# Patient Record
Sex: Male | Born: 1955 | Race: Black or African American | Hispanic: No | Marital: Single | Smoking: Never smoker
Health system: Southern US, Community
[De-identification: ages and names within clinical notes are randomized; demographics above are authoritative.]

## PROBLEM LIST (undated history)

## (undated) DIAGNOSIS — E785 Hyperlipidemia, unspecified: Secondary | ICD-10-CM

## (undated) DIAGNOSIS — I509 Heart failure, unspecified: Secondary | ICD-10-CM

## (undated) DIAGNOSIS — I10 Essential (primary) hypertension: Secondary | ICD-10-CM

## (undated) DIAGNOSIS — E119 Type 2 diabetes mellitus without complications: Secondary | ICD-10-CM

## (undated) DIAGNOSIS — I429 Cardiomyopathy, unspecified: Secondary | ICD-10-CM

## (undated) HISTORY — DX: Heart failure, unspecified: I50.9

## (undated) HISTORY — DX: Type 2 diabetes mellitus without complications: E11.9

## (undated) HISTORY — DX: Hyperlipidemia, unspecified: E78.5

## (undated) HISTORY — DX: Cardiomyopathy, unspecified: I42.9

## (undated) HISTORY — DX: Essential (primary) hypertension: I10

## (undated) NOTE — *Deleted (*Deleted)
Physical Medicine and Rehabilitation Consult  Reason for Consult: Stroke with functional deficits Referring Physician: Dr. Casper Harrison   HPI: Trevor Anderson is a 62 y.o. male with history of T2DM, HTN, non-obstructive CAD with CM/CHF who was admitted on 03/12/20 with 2 week history of progressive SOB as well as fall out of his chair. He was tachycardic and tachypneic at admission and started on IV heparin as well as IV diuresis due to concerns of PE v/s CHF. V/Q scan negative. 2D echo showed EF 45-50% with global hypokinesis of LV and trival MR. Later that am, he was found to have right facial weakness with dysarthria and right sided weakness. CTA head/neck was negative for LVO or core infarct and showed advanced intracranial atherosclerosis affecting cirlce of Willis, B-MCAs, L-PCA and distal ACAs.  CT right hip showed advanced hip OA.  MRI brain done revealing moderate left basal ganglia and small acute infarcts in right corona radiata and basal ganglia as well as paramedian cerebellum. Stroke felt to be embolic due to unknown source and TEE planned for work up but also at high risk for OSA due to body habitus. He was placed on DAPT for secondary stroke prevention. Patient with resultant flaccid right hemiplegia with moderate to severe dysarthria and dysphagia requiring dysphagia 2, nectar liquids.     Review of Systems  Constitutional: Negative for chills and fever.  HENT: Negative for hearing loss.   Eyes: Negative for blurred vision and double vision.  Respiratory: Negative for cough and shortness of breath.   Cardiovascular: Negative for chest pain.  Gastrointestinal: Negative for abdominal pain.  Musculoskeletal: Positive for joint pain.  Skin: Negative for rash.  Neurological: Positive for speech change and weakness.  Psychiatric/Behavioral: The patient has insomnia.       Past Medical History:  Diagnosis Date  . 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 CONGESTIVE HEART FAILURE  11/12/2007  . DIABETES MELLITUS, TYPE II 11/20/2008  . HYPERLIPIDEMIA 11/12/2007  . HYPERTENSION 11/12/2007  . TACHYCARDIA 01/24/2010    Past Surgical History:  Procedure Laterality Date  . TONSILLECTOMY  1970    Family History  Problem Relation Age of Onset  . Kidney disease Mother     Social History:  Lives with family?. Per reports that he has never smoked. He has never used smokeless tobacco. Per reports current alcohol use and that he does not use drugs.    Allergies: No Known Allergies    Medications Prior to Admission  Medication Sig Dispense Refill  . amLODipine (NORVASC) 5 MG tablet TAKE 1 TABLET BY MOUTH ONCE DAILY (Patient taking differently: Take 5 mg by mouth daily. ) 90 tablet 3  . aspirin 81 MG tablet Take 81 mg by mouth daily.      . carvedilol (COREG) 25 MG tablet TAKE 1 TABLET BY MOUTH TWICE DAILY WITH MEALS (Patient taking differently: Take 25 mg by mouth 2 (two) times daily with a meal. ) 60 tablet 11  . furosemide (LASIX) 40 MG tablet TAKE 1 TABLET BY MOUTH ONCE DAILY (Patient taking differently: Take 40 mg by mouth daily. ) 90 tablet 1  . hydrALAZINE (APRESOLINE) 25 MG tablet TAKE 1 TABLET BY MOUTH THREE TIMES DAILY (Patient taking differently: Take 25 mg by mouth 3 (three) times daily. ) 270 tablet 0  . isosorbide mononitrate (IMDUR) 60 MG 24 hr tablet TAKE 1 TABLET BY MOUTH ONCE DAILY (Patient taking differently: Take 60 mg by mouth daily. ) 90 tablet 1  .  KLOR-CON M20 20 MEQ tablet TAKE 1 TABLET BY MOUTH ONCE DAILY (Patient taking differently: Take 20 mEq by mouth daily. ) 90 tablet 1  . lisinopril (PRINIVIL,ZESTRIL) 20 MG tablet TAKE 1 TABLET BY MOUTH ONCE DAILY (Patient taking differently: Take 20 mg by mouth daily. ) 90 tablet 3  . metFORMIN (GLUCOPHAGE) 500 MG tablet TAKE 1 TABLET BY MOUTH ONCE DAILY (Patient taking differently: Take 500 mg by mouth daily with breakfast. ) 90 tablet 3  . simvastatin (ZOCOR) 40 MG tablet TAKE 1 TABLET BY MOUTH AT BEDTIME, NEED  DR VISIT FOR MORE REFILLS (Patient taking differently: Take 40 mg by mouth every morning. ) 90 tablet 2  . Lancets MISC 1 application by Does not apply route daily. 100 each 11    Home: Home Living Family/patient expects to be discharged to:: Private residence Living Arrangements: Children Available Help at Discharge: Family, Available PRN/intermittently Type of Home: House Home Access: Stairs to enter Secretary/administrator of Steps: 3 Entrance Stairs-Rails: Can reach both Home Layout: One level Bathroom Shower/Tub: Engineer, manufacturing systems: Standard Home Equipment: Cane - single point  Functional History: Prior Function Level of Independence: Independent Functional Status:  Mobility: Bed Mobility Overal bed mobility: Needs Assistance Bed Mobility: Rolling Rolling: Max assist Supine to sit: Mod assist, +2 for physical assistance Sit to supine: Mod assist General bed mobility comments: Pt able to move L side but required heavy assistance for R side.  Pt presents with R side posterior lean in sitting edge of bed.  Sitting balance improved with use of sara stedy in front as he was able to hold to cross bar. Transfers Overall transfer level: Needs assistance Equipment used: Ambulation equipment used (sara stedy) Transfers: Sit to/from Stand Sit to Stand: Max assist, +2 safety/equipment, From elevated surface General transfer comment: Pt able to follow commands to utilize L side of his body to come into standing.  Stedy blocked R knee and PTA facilitated trunk control and weight bearing to R shoulder and head control.  Emphasis on finding midline.      ADL: ADL Overall ADL's : Needs assistance/impaired Eating/Feeding: Moderate assistance, Bed level Grooming: Wash/dry hands, Wash/dry face, Moderate assistance, Bed level Upper Body Bathing: Maximal assistance, Bed level Lower Body Bathing: Total assistance, Bed level Upper Body Dressing : Maximal assistance, Bed level  Lower Body Dressing: Total assistance, Bed level Toileting- Clothing Manipulation and Hygiene: Total assistance, Bed level  Cognition: Cognition Overall Cognitive Status: Difficult to assess (due to severity of dysarthria) Arousal/Alertness: Awake/alert Orientation Level: Oriented X4 Attention: Sustained, Focused Focused Attention: Impaired Focused Attention Impairment: Verbal complex Sustained Attention: Impaired Sustained Attention Impairment: Verbal complex Cognition Arousal/Alertness: Awake/alert Behavior During Therapy: WFL for tasks assessed/performed Overall Cognitive Status: Difficult to assess (due to severity of dysarthria) Area of Impairment: Following commands, Safety/judgement Following Commands: Follows one step commands inconsistently, Follows one step commands with increased time Safety/Judgement: Decreased awareness of safety, Decreased awareness of deficits General Comments: Pt fatigues quickly but willing to work.  Required increased time and redirection to follow commands.  Blood pressure (!) 108/91, pulse 93, temperature 98.3 F (36.8 C), temperature source Oral, resp. rate 14, height 5\' 11"  (1.803 m), weight 110 kg, SpO2 99 %. Physical Exam Vitals and nursing note reviewed.  Constitutional:      Comments: Was sleeping soundly and snoring. Had hard time waking up and staying awake--reports poor sleep.   Neurological:     Comments: Expressive deficits with bouts of aphonia--needed encouragement to use words  as easily frustrated. Right facial weakness with ptosis? Able to nod Y/N for basic orientation questions. Right sided weakness noted and he moved LUE/LLE to verbal/tactile cues.     Results for orders placed or performed during the hospital encounter of 03/11/20 (from the past 24 hour(s))  CBC     Status: None   Collection Time: 03/15/20  1:17 AM  Result Value Ref Range   WBC 7.5 4.0 - 10.5 K/uL   RBC 4.56 4.22 - 5.81 MIL/uL   Hemoglobin 13.9 13.0 - 17.0  g/dL   HCT 82.9 39 - 52 %   MCV 95.4 80.0 - 100.0 fL   MCH 30.5 26.0 - 34.0 pg   MCHC 32.0 30.0 - 36.0 g/dL   RDW 56.2 13.0 - 86.5 %   Platelets 280 150 - 400 K/uL   nRBC 0.0 0.0 - 0.2 %  Basic metabolic panel     Status: Abnormal   Collection Time: 03/15/20  1:17 AM  Result Value Ref Range   Sodium 142 135 - 145 mmol/L   Potassium 3.2 (L) 3.5 - 5.1 mmol/L   Chloride 106 98 - 111 mmol/L   CO2 25 22 - 32 mmol/L   Glucose, Bld 98 70 - 99 mg/dL   BUN 36 (H) 8 - 23 mg/dL   Creatinine, Ser 7.84 (H) 0.61 - 1.24 mg/dL   Calcium 8.5 (L) 8.9 - 10.3 mg/dL   GFR, Estimated 35 (L) >60 mL/min   Anion gap 11 5 - 15  Glucose, capillary     Status: Abnormal   Collection Time: 03/15/20  6:33 AM  Result Value Ref Range   Glucose-Capillary 130 (H) 70 - 99 mg/dL   Comment 1 Notify RN    Comment 2 Document in Chart   Glucose, capillary     Status: Abnormal   Collection Time: 03/15/20 11:41 AM  Result Value Ref Range   Glucose-Capillary 154 (H) 70 - 99 mg/dL  Glucose, capillary     Status: Abnormal   Collection Time: 03/15/20  5:06 PM  Result Value Ref Range   Glucose-Capillary 103 (H) 70 - 99 mg/dL   MR BRAIN WO CONTRAST  Result Date: 03/14/2020 CLINICAL DATA:  Code stroke follow-up, abnormal CT EXAM: MRI HEAD WITHOUT CONTRAST TECHNIQUE: Multiplanar, multiecho pulse sequences of the brain and surrounding structures were obtained without intravenous contrast. COMPARISON:  Correlation made with prior CT imaging FINDINGS: Motion artifact is present. Brain: There is restricted diffusion involving the left caudate body, corona radiata, and posterior left lentiform nucleus. There are 3 small foci of restricted diffusion involving the right corona radiata and lentiform nucleus. Small focus of restricted diffusion in the left parasagittal cerebellum. There are two punctate foci of susceptibility in the right corona radiata and a third punctate focus in the medial left temporal lobe likely reflecting  chronic microhemorrhages. Additional patchy and confluent areas of T2 hyperintensity in the supratentorial white matter are nonspecific but probably reflect moderate chronic microvascular ischemic changes. There is no intracranial mass or significant mass effect. No hydrocephalus. Vascular: Major vessel flow voids at the skull base are preserved. Skull and upper cervical spine: Normal marrow signal is preserved. Sinuses/Orbits: Paranasal sinuses are aerated. Orbits are unremarkable. Other: Sella is partially empty.  Mastoid air cells are clear. IMPRESSION: Moderate size acute infarction involving the left basal ganglia and adjacent white matter. Additional small acute infarcts involving the right corona radiata and basal ganglia and left paramedian cerebellum. Moderate chronic microvascular ischemic changes. Electronically Signed  By: Guadlupe Spanish M.D.   On: 03/14/2020 14:48    ***  Jacquelynn Cree, PA-C 03/15/2020

---
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
## 2007-08-21 ENCOUNTER — Inpatient Hospital Stay (HOSPITAL_COMMUNITY): Admission: EM | Admit: 2007-08-21 | Discharge: 2007-08-28 | Payer: Self-pay | Admitting: Emergency Medicine

## 2007-08-21 ENCOUNTER — Encounter: Payer: Self-pay | Admitting: Cardiology

## 2007-08-21 ENCOUNTER — Ambulatory Visit: Payer: Self-pay | Admitting: Cardiology

## 2007-09-18 ENCOUNTER — Ambulatory Visit: Payer: Self-pay | Admitting: Cardiology

## 2007-09-18 LAB — CONVERTED CEMR LAB
BUN: 19 mg/dL (ref 6–23)
CO2: 28 meq/L (ref 19–32)
Calcium: 9.5 mg/dL (ref 8.4–10.5)
Chloride: 104 meq/L (ref 96–112)
Creatinine, Ser: 1.6 mg/dL — ABNORMAL HIGH (ref 0.4–1.5)
GFR calc Af Amer: 59 mL/min
GFR calc non Af Amer: 49 mL/min
Glucose, Bld: 92 mg/dL (ref 70–99)
Potassium: 4.2 meq/L (ref 3.5–5.1)
Pro B Natriuretic peptide (BNP): 63 pg/mL (ref 0.0–100.0)
Sodium: 141 meq/L (ref 135–145)

## 2007-10-02 ENCOUNTER — Ambulatory Visit: Payer: Self-pay

## 2007-10-02 ENCOUNTER — Encounter: Payer: Self-pay | Admitting: Cardiology

## 2007-10-22 ENCOUNTER — Ambulatory Visit: Payer: Self-pay | Admitting: Internal Medicine

## 2007-11-04 ENCOUNTER — Ambulatory Visit: Payer: Self-pay | Admitting: Internal Medicine

## 2007-11-04 LAB — CONVERTED CEMR LAB
BUN: 16 mg/dL (ref 6–23)
CO2: 28 meq/L (ref 19–32)
Calcium: 9 mg/dL (ref 8.4–10.5)
Chloride: 108 meq/L (ref 96–112)
Creatinine, Ser: 1.4 mg/dL (ref 0.4–1.5)
GFR calc Af Amer: 69 mL/min
GFR calc non Af Amer: 57 mL/min
Glucose, Bld: 126 mg/dL — ABNORMAL HIGH (ref 70–99)
Potassium: 3.6 meq/L (ref 3.5–5.1)
Sodium: 143 meq/L (ref 135–145)

## 2007-11-12 ENCOUNTER — Ambulatory Visit: Payer: Self-pay | Admitting: Internal Medicine

## 2007-11-12 DIAGNOSIS — I10 Essential (primary) hypertension: Secondary | ICD-10-CM

## 2007-11-12 DIAGNOSIS — I509 Heart failure, unspecified: Secondary | ICD-10-CM

## 2007-11-12 DIAGNOSIS — E785 Hyperlipidemia, unspecified: Secondary | ICD-10-CM

## 2007-11-12 DIAGNOSIS — E782 Mixed hyperlipidemia: Secondary | ICD-10-CM | POA: Insufficient documentation

## 2007-11-12 HISTORY — DX: Essential (primary) hypertension: I10

## 2007-11-12 HISTORY — DX: Heart failure, unspecified: I50.9

## 2007-11-12 HISTORY — DX: Hyperlipidemia, unspecified: E78.5

## 2007-11-15 LAB — CONVERTED CEMR LAB
ALT: 9 units/L (ref 0–53)
AST: 19 units/L (ref 0–37)
Albumin: 3.9 g/dL (ref 3.5–5.2)
Alkaline Phosphatase: 56 units/L (ref 39–117)
BUN: 13 mg/dL (ref 6–23)
Basophils Absolute: 0 10*3/uL (ref 0.0–0.1)
Basophils Relative: 0 % (ref 0.0–3.0)
Bilirubin Urine: NEGATIVE
Bilirubin, Direct: 0.1 mg/dL (ref 0.0–0.3)
CO2: 30 meq/L (ref 19–32)
Calcium: 9.1 mg/dL (ref 8.4–10.5)
Chloride: 105 meq/L (ref 96–112)
Cholesterol: 105 mg/dL (ref 0–200)
Creatinine, Ser: 1.3 mg/dL (ref 0.4–1.5)
Eosinophils Absolute: 0.2 10*3/uL (ref 0.0–0.7)
Eosinophils Relative: 2.6 % (ref 0.0–5.0)
GFR calc Af Amer: 75 mL/min
GFR calc non Af Amer: 62 mL/min
Glucose, Bld: 109 mg/dL — ABNORMAL HIGH (ref 70–99)
HCT: 37.6 % — ABNORMAL LOW (ref 39.0–52.0)
HDL: 32.3 mg/dL — ABNORMAL LOW (ref >39.0)
Hemoglobin, Urine: NEGATIVE
Hemoglobin: 12.9 g/dL — ABNORMAL LOW (ref 13.0–17.0)
Hgb A1c MFr Bld: 7 % — ABNORMAL HIGH (ref 4.6–6.0)
Ketones, ur: NEGATIVE mg/dL
LDL Cholesterol: 58 mg/dL (ref 0–99)
Leukocytes, UA: NEGATIVE
Lymphocytes Relative: 28.5 % (ref 12.0–46.0)
MCHC: 34.4 g/dL (ref 30.0–36.0)
MCV: 91.5 fL (ref 78.0–100.0)
Monocytes Absolute: 0.3 10*3/uL (ref 0.1–1.0)
Monocytes Relative: 5.6 % (ref 3.0–12.0)
Neutro Abs: 3.6 10*3/uL (ref 1.4–7.7)
Neutrophils Relative %: 63.3 % (ref 43.0–77.0)
Nitrite: NEGATIVE
PSA: 0.92 ng/mL (ref 0.10–4.00)
Platelets: 223 10*3/uL (ref 150–400)
Potassium: 3.9 meq/L (ref 3.5–5.1)
RBC: 4.11 M/uL — ABNORMAL LOW (ref 4.22–5.81)
RDW: 15 % — ABNORMAL HIGH (ref 11.5–14.6)
Sodium: 141 meq/L (ref 135–145)
Specific Gravity, Urine: 1.01 (ref 1.000–1.03)
TSH: 0.85 microintl units/mL (ref 0.35–5.50)
Total Bilirubin: 0.7 mg/dL (ref 0.3–1.2)
Total CHOL/HDL Ratio: 3.3
Total Protein, Urine: NEGATIVE mg/dL
Total Protein: 7.6 g/dL (ref 6.0–8.3)
Triglycerides: 72 mg/dL (ref 0–149)
Urine Glucose: NEGATIVE mg/dL
Urobilinogen, UA: 0.2 (ref 0.0–1.0)
VLDL: 14 mg/dL (ref 0–40)
WBC: 5.8 10*3/uL (ref 4.5–10.5)
pH: 6 (ref 5.0–8.0)

## 2008-01-30 ENCOUNTER — Ambulatory Visit: Payer: Self-pay | Admitting: Internal Medicine

## 2008-05-18 ENCOUNTER — Ambulatory Visit: Payer: Self-pay | Admitting: Internal Medicine

## 2008-09-18 ENCOUNTER — Encounter (INDEPENDENT_AMBULATORY_CARE_PROVIDER_SITE_OTHER): Payer: Self-pay | Admitting: *Deleted

## 2008-10-23 ENCOUNTER — Encounter: Payer: Self-pay | Admitting: Internal Medicine

## 2008-10-23 ENCOUNTER — Ambulatory Visit: Payer: Self-pay | Admitting: Internal Medicine

## 2008-11-09 ENCOUNTER — Telehealth: Payer: Self-pay | Admitting: Internal Medicine

## 2008-11-10 ENCOUNTER — Telehealth: Payer: Self-pay | Admitting: Internal Medicine

## 2008-11-20 ENCOUNTER — Ambulatory Visit: Payer: Self-pay | Admitting: Internal Medicine

## 2008-11-20 DIAGNOSIS — E119 Type 2 diabetes mellitus without complications: Secondary | ICD-10-CM

## 2008-11-20 HISTORY — DX: Type 2 diabetes mellitus without complications: E11.9

## 2008-11-20 LAB — CONVERTED CEMR LAB
ALT: 10 units/L (ref 0–53)
AST: 17 units/L (ref 0–37)
Albumin: 4.1 g/dL (ref 3.5–5.2)
Alkaline Phosphatase: 43 units/L (ref 39–117)
BUN: 19 mg/dL (ref 6–23)
Basophils Absolute: 0 10*3/uL (ref 0.0–0.1)
Basophils Relative: 0 % (ref 0.0–3.0)
Bilirubin Urine: NEGATIVE
Bilirubin, Direct: 0.1 mg/dL (ref 0.0–0.3)
CO2: 26 meq/L (ref 19–32)
Calcium: 9.1 mg/dL (ref 8.4–10.5)
Chloride: 108 meq/L (ref 96–112)
Cholesterol: 125 mg/dL (ref 0–200)
Creatinine, Ser: 1.4 mg/dL (ref 0.4–1.5)
Creatinine,U: 32 mg/dL
Eosinophils Absolute: 0.2 10*3/uL (ref 0.0–0.7)
Eosinophils Relative: 3.4 % (ref 0.0–5.0)
GFR calc non Af Amer: 68.71 mL/min (ref >60)
Glucose, Bld: 109 mg/dL — ABNORMAL HIGH (ref 70–99)
HCT: 36.8 % — ABNORMAL LOW (ref 39.0–52.0)
HDL: 35 mg/dL — ABNORMAL LOW (ref >39.00)
Hemoglobin, Urine: NEGATIVE
Hemoglobin: 12.8 g/dL — ABNORMAL LOW (ref 13.0–17.0)
Hgb A1c MFr Bld: 6.1 % (ref 4.6–6.5)
Ketones, ur: NEGATIVE mg/dL
LDL Cholesterol: 72 mg/dL (ref 0–99)
Leukocytes, UA: NEGATIVE
Lymphocytes Relative: 32.9 % (ref 12.0–46.0)
Lymphs Abs: 1.8 10*3/uL (ref 0.7–4.0)
MCHC: 34.7 g/dL (ref 30.0–36.0)
MCV: 94.6 fL (ref 78.0–100.0)
Microalb Creat Ratio: 9.4 mg/g (ref 0.0–30.0)
Microalb, Ur: 0.3 mg/dL (ref 0.0–1.9)
Monocytes Absolute: 0.4 10*3/uL (ref 0.1–1.0)
Monocytes Relative: 7 % (ref 3.0–12.0)
Neutro Abs: 3 10*3/uL (ref 1.4–7.7)
Neutrophils Relative %: 56.7 % (ref 43.0–77.0)
Nitrite: NEGATIVE
Platelets: 202 10*3/uL (ref 150.0–400.0)
Potassium: 4.1 meq/L (ref 3.5–5.1)
RBC: 3.89 M/uL — ABNORMAL LOW (ref 4.22–5.81)
RDW: 13 % (ref 11.5–14.6)
Sodium: 143 meq/L (ref 135–145)
Specific Gravity, Urine: 1.01 (ref 1.000–1.030)
TSH: 1.2 microintl units/mL (ref 0.35–5.50)
Total Bilirubin: 0.7 mg/dL (ref 0.3–1.2)
Total CHOL/HDL Ratio: 4
Total Protein, Urine: NEGATIVE mg/dL
Total Protein: 7.6 g/dL (ref 6.0–8.3)
Triglycerides: 88 mg/dL (ref 0.0–149.0)
Urine Glucose: NEGATIVE mg/dL
Urobilinogen, UA: 0.2 (ref 0.0–1.0)
VLDL: 17.6 mg/dL (ref 0.0–40.0)
WBC: 5.4 10*3/uL (ref 4.5–10.5)
pH: 5.5 (ref 5.0–8.0)

## 2008-12-04 ENCOUNTER — Ambulatory Visit: Payer: Self-pay | Admitting: Internal Medicine

## 2009-05-21 ENCOUNTER — Ambulatory Visit: Payer: Self-pay | Admitting: Internal Medicine

## 2009-05-21 DIAGNOSIS — J069 Acute upper respiratory infection, unspecified: Secondary | ICD-10-CM | POA: Insufficient documentation

## 2009-05-21 LAB — CONVERTED CEMR LAB
BUN: 17 mg/dL (ref 6–23)
CO2: 28 meq/L (ref 19–32)
Calcium: 9 mg/dL (ref 8.4–10.5)
Chloride: 109 meq/L (ref 96–112)
Cholesterol: 113 mg/dL (ref 0–200)
Creatinine, Ser: 1.4 mg/dL (ref 0.4–1.5)
GFR calc non Af Amer: 68.57 mL/min (ref >60)
Glucose, Bld: 111 mg/dL — ABNORMAL HIGH (ref 70–99)
HDL: 38.5 mg/dL — ABNORMAL LOW (ref >39.00)
Hgb A1c MFr Bld: 6.2 % (ref 4.6–6.5)
LDL Cholesterol: 61 mg/dL (ref 0–99)
Potassium: 3.9 meq/L (ref 3.5–5.1)
Sodium: 143 meq/L (ref 135–145)
Total CHOL/HDL Ratio: 3
Triglycerides: 67 mg/dL (ref 0.0–149.0)
VLDL: 13.4 mg/dL (ref 0.0–40.0)

## 2009-06-22 ENCOUNTER — Encounter: Payer: Self-pay | Admitting: Internal Medicine

## 2009-11-17 ENCOUNTER — Ambulatory Visit: Payer: Self-pay | Admitting: Internal Medicine

## 2009-11-17 LAB — CONVERTED CEMR LAB
ALT: 9 units/L (ref 0–53)
AST: 18 units/L (ref 0–37)
Albumin: 4.1 g/dL (ref 3.5–5.2)
Alkaline Phosphatase: 56 units/L (ref 39–117)
BUN: 17 mg/dL (ref 6–23)
Basophils Absolute: 0 10*3/uL (ref 0.0–0.1)
Basophils Relative: 0.7 % (ref 0.0–3.0)
Bilirubin Urine: NEGATIVE
Bilirubin, Direct: 0.2 mg/dL (ref 0.0–0.3)
CO2: 27 meq/L (ref 19–32)
Calcium: 8.7 mg/dL (ref 8.4–10.5)
Chloride: 104 meq/L (ref 96–112)
Cholesterol: 130 mg/dL (ref 0–200)
Creatinine, Ser: 1.4 mg/dL (ref 0.4–1.5)
Creatinine,U: 18.5 mg/dL
Eosinophils Absolute: 0.1 10*3/uL (ref 0.0–0.7)
Eosinophils Relative: 2.6 % (ref 0.0–5.0)
GFR calc non Af Amer: 69.59 mL/min (ref >60)
Glucose, Bld: 106 mg/dL — ABNORMAL HIGH (ref 70–99)
HCT: 39.4 % (ref 39.0–52.0)
HDL: 33.5 mg/dL — ABNORMAL LOW (ref >39.00)
Hemoglobin, Urine: NEGATIVE
Hemoglobin: 13.5 g/dL (ref 13.0–17.0)
Hgb A1c MFr Bld: 6.5 % (ref 4.6–6.5)
Ketones, ur: NEGATIVE mg/dL
LDL Cholesterol: 76 mg/dL (ref 0–99)
Leukocytes, UA: NEGATIVE
Lymphocytes Relative: 34.2 % (ref 12.0–46.0)
Lymphs Abs: 1.9 10*3/uL (ref 0.7–4.0)
MCHC: 34.2 g/dL (ref 30.0–36.0)
MCV: 95.2 fL (ref 78.0–100.0)
Microalb Creat Ratio: 16.3 mg/g (ref 0.0–30.0)
Microalb, Ur: 3 mg/dL — ABNORMAL HIGH (ref 0.0–1.9)
Monocytes Absolute: 0.3 10*3/uL (ref 0.1–1.0)
Monocytes Relative: 5.7 % (ref 3.0–12.0)
Neutro Abs: 3.2 10*3/uL (ref 1.4–7.7)
Neutrophils Relative %: 56.8 % (ref 43.0–77.0)
Nitrite: NEGATIVE
PSA: 1.34 ng/mL (ref 0.10–4.00)
Platelets: 209 10*3/uL (ref 150.0–400.0)
Potassium: 3.9 meq/L (ref 3.5–5.1)
RBC: 4.14 M/uL — ABNORMAL LOW (ref 4.22–5.81)
RDW: 13.3 % (ref 11.5–14.6)
Sodium: 137 meq/L (ref 135–145)
Specific Gravity, Urine: 1.01 (ref 1.000–1.030)
TSH: 1.31 microintl units/mL (ref 0.35–5.50)
Total Bilirubin: 0.9 mg/dL (ref 0.3–1.2)
Total CHOL/HDL Ratio: 4
Total Protein, Urine: NEGATIVE mg/dL
Total Protein: 7.3 g/dL (ref 6.0–8.3)
Triglycerides: 104 mg/dL (ref 0.0–149.0)
Urine Glucose: NEGATIVE mg/dL
Urobilinogen, UA: 0.2 (ref 0.0–1.0)
VLDL: 20.8 mg/dL (ref 0.0–40.0)
WBC: 5.6 10*3/uL (ref 4.5–10.5)
pH: 5.5 (ref 5.0–8.0)

## 2009-11-19 ENCOUNTER — Ambulatory Visit: Payer: Self-pay | Admitting: Internal Medicine

## 2009-11-19 ENCOUNTER — Encounter: Payer: Self-pay | Admitting: Internal Medicine

## 2009-11-19 ENCOUNTER — Telehealth (INDEPENDENT_AMBULATORY_CARE_PROVIDER_SITE_OTHER): Payer: Self-pay | Admitting: *Deleted

## 2010-01-24 ENCOUNTER — Ambulatory Visit: Payer: Self-pay | Admitting: Internal Medicine

## 2010-01-24 DIAGNOSIS — I429 Cardiomyopathy, unspecified: Secondary | ICD-10-CM

## 2010-01-24 DIAGNOSIS — R0989 Other specified symptoms and signs involving the circulatory and respiratory systems: Secondary | ICD-10-CM | POA: Insufficient documentation

## 2010-01-24 HISTORY — DX: Cardiomyopathy, unspecified: I42.9

## 2010-01-28 ENCOUNTER — Ambulatory Visit (HOSPITAL_COMMUNITY): Admission: RE | Admit: 2010-01-28 | Discharge: 2010-01-28 | Payer: Self-pay | Admitting: Internal Medicine

## 2010-01-28 ENCOUNTER — Ambulatory Visit: Payer: Self-pay | Admitting: Internal Medicine

## 2010-01-28 ENCOUNTER — Ambulatory Visit: Payer: Self-pay

## 2010-01-28 ENCOUNTER — Encounter: Payer: Self-pay | Admitting: Internal Medicine

## 2010-02-08 LAB — CONVERTED CEMR LAB
Free T4: 0.8 ng/dL (ref 0.60–1.60)
TSH: 1.52 microintl units/mL (ref 0.35–5.50)

## 2010-05-13 ENCOUNTER — Ambulatory Visit: Admit: 2010-05-13 | Payer: Self-pay | Admitting: Internal Medicine

## 2010-05-23 ENCOUNTER — Other Ambulatory Visit: Payer: Self-pay | Admitting: Internal Medicine

## 2010-05-23 ENCOUNTER — Ambulatory Visit
Admission: RE | Admit: 2010-05-23 | Discharge: 2010-05-23 | Payer: Self-pay | Source: Home / Self Care | Attending: Internal Medicine | Admitting: Internal Medicine

## 2010-05-23 LAB — BASIC METABOLIC PANEL
CO2: 28 mEq/L (ref 19–32)
Chloride: 105 mEq/L (ref 96–112)
Creatinine, Ser: 1.4 mg/dL (ref 0.4–1.5)
GFR: 66.65 mL/min (ref >60.00)
Glucose, Bld: 105 mg/dL — ABNORMAL HIGH (ref 70–99)
Potassium: 4.6 mEq/L (ref 3.5–5.1)
Sodium: 140 mEq/L (ref 135–145)

## 2010-05-23 LAB — LIPID PANEL
Cholesterol: 116 mg/dL (ref 0–200)
HDL: 32.9 mg/dL — ABNORMAL LOW (ref >39.00)
LDL Cholesterol: 67 mg/dL (ref 0–99)
Total CHOL/HDL Ratio: 4
Triglycerides: 83 mg/dL (ref 0.0–149.0)

## 2010-05-23 LAB — HEMOGLOBIN A1C: Hgb A1c MFr Bld: 6.6 % — ABNORMAL HIGH (ref 4.6–6.5)

## 2010-05-24 NOTE — Assessment & Plan Note (Signed)
Summary: 6 MO ROV /NWS $50   Vital Signs:  Patient profile:   55 year old male Height:      72 inches Weight:      219 pounds BMI:     29.81 O2 Sat:      98 % on Room air Temp:     99.5 degrees F oral BP sitting:   104 / 70  (left arm) Cuff size:   large  Vitals Entered ByZella Ball Ewing (May 21, 2009 9:18 AM)  O2 Flow:  Room air CC: 6 Mo ROV/RE   Primary Care Provider:  [name] MD  CC:  6 Mo ROV/RE.  History of Present Illness: here with a significnat sense of weakness and dizziness with each hydralzine pill; none if does not take the pill;  has been trying to do better with diet and excercise;  has not lost wt, but has been trying to avoid salt;  Pt denies CP, sob, doe, wheezing, orthopnea, pnd, worsening LE edema, palps, dizziness or syncope  Pt denies new neuro symptoms such as headache, facial or extremity weakness  Pt denies polydipsia, polyuria, or low sugar symptoms such as shakiness improved with eating.  Overall good compliance with meds, trying to follow low chol, DM diet, wt stable, little excercise however     BP's at home always less than 110 SBP with his machine.   Also recently with slight ST a few days ago but now resolved.    Problems Prior to Update: 1)  Uri  (ICD-465.9) 2)  Diabetes Mellitus, Type II  (ICD-250.00) 3)  Congestive Heart Failure  (ICD-428.0) 4)  Preventive Health Care  (ICD-V70.0) 5)  Hyperlipidemia  (ICD-272.4) 6)  Hypertension  (ICD-401.9)  Medications Prior to Update: 1)  Aspirin 81 Mg Tbec (Aspirin) .... Take One Tablet By Mouth Daily 2)  Norvasc 5 Mg  Tabs (Amlodipine Besylate) .... Take 1 Tablet By Mouth Once A Day 3)  Coreg 25 Mg  Tabs (Carvedilol) .... Take 1 Tablet By Mouth Two Times A Day 4)  Lasix 40 Mg  Tabs (Furosemide) .... Take 1 Tablet By Mouth Once A Day 5)  Zestril 20 Mg  Tabs (Lisinopril) .Marland Kitchen.. 1 By Mouth Once Daily 6)  Zocor 40 Mg  Tabs (Simvastatin) .... Take 1 Tablet At Bedtime 7)  Klor-Con M20 20 Meq  Cr-Tabs  (Potassium Chloride Crys Cr) .Marland Kitchen.. 1 By Mouth Once Daily 8)  Isosorbide Mononitrate Cr 60 Mg  Tb24 (Isosorbide Mononitrate) .Marland Kitchen.. 1 By Mouth Once Daily 9)  Metformin Hcl 500 Mg  Tabs (Metformin Hcl) .Marland Kitchen.. 1 By Mouth Once Daily 10)  Hydralazine Hcl 100 Mg Tabs (Hydralazine Hcl) .... Take One Tablet By Mouth Three Times A Day  Current Medications (verified): 1)  Aspirin 81 Mg Tbec (Aspirin) .... Take One Tablet By Mouth Daily 2)  Norvasc 5 Mg  Tabs (Amlodipine Besylate) .... Take 1 Tablet By Mouth Once A Day 3)  Coreg 25 Mg  Tabs (Carvedilol) .... Take 1 Tablet By Mouth Two Times A Day 4)  Lasix 40 Mg  Tabs (Furosemide) .... Take 1 Tablet By Mouth Once A Day 5)  Zestril 20 Mg  Tabs (Lisinopril) .Marland Kitchen.. 1 By Mouth Once Daily 6)  Zocor 40 Mg  Tabs (Simvastatin) .... Take 1 Tablet At Bedtime 7)  Klor-Con M20 20 Meq  Cr-Tabs (Potassium Chloride Crys Cr) .Marland Kitchen.. 1 By Mouth Once Daily 8)  Isosorbide Mononitrate Cr 60 Mg  Tb24 (Isosorbide Mononitrate) .Marland Kitchen.. 1 By Mouth  Once Daily 9)  Metformin Hcl 500 Mg  Tabs (Metformin Hcl) .Marland Kitchen.. 1 By Mouth Once Daily 10)  Hydralazine Hcl 50 Mg Tabs (Hydralazine Hcl) .Marland Kitchen.. 1 By Mouth Three Times A Day  Allergies (verified): No Known Drug Allergies  Past History:  Past Medical History: Last updated: 12/04/2008 Hypertension Hyperlipidemia hypertensive cardiomyopathy/CHF    --resolved EF now 60-65% Diabetes mellitus, type II  Past Surgical History: Last updated: 11/12/2007 Tonsillectomy-1970s  Social History: Last updated: 11/12/2007 Divorced 1 son work - time Physiological scientist guy Never Smoked Alcohol use-yes - social  Risk Factors: Smoking Status: never (11/12/2007)  Review of Systems       all otherwise negative per pt -  Physical Exam  General:  alert and overweight-appearing.   Head:  normocephalic and atraumatic.   Eyes:  vision grossly intact, pupils equal, and pupils round.   Ears:  R ear normal and L ear normal.   Nose:  no external deformity and  no nasal discharge.   Mouth:  pharyngeal erythema and fair dentition.   Neck:  supple and no masses.   Lungs:  normal respiratory effort and normal breath sounds.   Heart:  normal rate and regular rhythm.   Extremities:  no edema, no erythema    Impression & Recommendations:  Problem # 1:  HYPERTENSION (ICD-401.9)  His updated medication list for this problem includes:    Norvasc 5 Mg Tabs (Amlodipine besylate) .Marland Kitchen... Take 1 tablet by mouth once a day    Coreg 25 Mg Tabs (Carvedilol) .Marland Kitchen... Take 1 tablet by mouth two times a day    Lasix 40 Mg Tabs (Furosemide) .Marland Kitchen... Take 1 tablet by mouth once a day    Zestril 20 Mg Tabs (Lisinopril) .Marland Kitchen... 1 by mouth once daily    Hydralazine Hcl 50 Mg Tabs (Hydralazine hcl) .Marland Kitchen... 1 by mouth three times a day overcontrolled by symptoms - will have to decrease the hydralzine to 50 three times a day, f/u BP at home  Problem # 2:  DIABETES MELLITUS, TYPE II (ICD-250.00)  His updated medication list for this problem includes:    Aspirin 81 Mg Tbec (Aspirin) .Marland Kitchen... Take one tablet by mouth daily    Zestril 20 Mg Tabs (Lisinopril) .Marland Kitchen... 1 by mouth once daily    Metformin Hcl 500 Mg Tabs (Metformin hcl) .Marland Kitchen... 1 by mouth once daily  Orders: TLB-BMP (Basic Metabolic Panel-BMET) (80048-METABOL) TLB-A1C / Hgb A1C (Glycohemoglobin) (83036-A1C) TLB-Lipid Panel (80061-LIPID)  Labs Reviewed: Creat: 1.4 (11/20/2008)    Reviewed HgBA1c results: 6.1 (11/20/2008)  7.0 (11/12/2007) stable overall by hx and exam, ok to continue meds/tx as is  , Pt to cont DM diet, excercise, wt loss efforts; to check labs today   Problem # 3:  HYPERLIPIDEMIA (ICD-272.4)  His updated medication list for this problem includes:    Zocor 40 Mg Tabs (Simvastatin) .Marland Kitchen... Take 1 tablet at bedtime  Labs Reviewed: SGOT: 17 (11/20/2008)   SGPT: 10 (11/20/2008)   HDL:35.00 (11/20/2008), 32.3 (11/12/2007)  LDL:72 (11/20/2008), 58 (11/12/2007)  Chol:125 (11/20/2008), 105 (11/12/2007)   Trig:88.0 (11/20/2008), 72 (11/12/2007) stable overall by hx and exam, ok to continue meds/tx as is   Problem # 4:  URI (ICD-465.9)  His updated medication list for this problem includes:    Aspirin 81 Mg Tbec (Aspirin) .Marland Kitchen... Take one tablet by mouth daily c/w viral URI most likely , ok to follow, tylenol as needed   Complete Medication List: 1)  Aspirin 81 Mg Tbec (Aspirin) .... Take  one tablet by mouth daily 2)  Norvasc 5 Mg Tabs (Amlodipine besylate) .... Take 1 tablet by mouth once a day 3)  Coreg 25 Mg Tabs (Carvedilol) .... Take 1 tablet by mouth two times a day 4)  Lasix 40 Mg Tabs (Furosemide) .... Take 1 tablet by mouth once a day 5)  Zestril 20 Mg Tabs (Lisinopril) .Marland Kitchen.. 1 by mouth once daily 6)  Zocor 40 Mg Tabs (Simvastatin) .... Take 1 tablet at bedtime 7)  Klor-con M20 20 Meq Cr-tabs (Potassium chloride crys cr) .Marland Kitchen.. 1 by mouth once daily 8)  Isosorbide Mononitrate Cr 60 Mg Tb24 (Isosorbide mononitrate) .Marland Kitchen.. 1 by mouth once daily 9)  Metformin Hcl 500 Mg Tabs (Metformin hcl) .Marland Kitchen.. 1 by mouth once daily 10)  Hydralazine Hcl 50 Mg Tabs (Hydralazine hcl) .Marland Kitchen.. 1 by mouth three times a day  Other Orders: Admin 1st Vaccine (35573) Flu Vaccine 34yrs + [phone])  Patient Instructions: 1)  decrease the hydralazine to 50 mg three times a day  2)  you had the flu shot today 3)  Please go to the Lab in the basement for your blood and/or urine tests today  4)  Please schedule a follow-up appointment in 6 months with CPX labs and : 5)  HbgA1C prior to visit, ICD-9: 250.02 6)  Urine Microalbumin prior to visit, ICD-9: Prescriptions: HYDRALAZINE HCL 50 MG TABS (HYDRALAZINE HCL) 1 by mouth three times a day  #270 x 3   Entered and Authorized by:   [name] MD   Signed by:   [name] MD on 05/21/2009   Method used:   Print then Give to Patient   RxID:   [id number] HYDRALAZINE HCL 50 MG TABS (HYDRALAZINE HCL) 1 by mouth three times a day  #270 x 3   Entered and Authorized  by:   [name] MD   Signed by:   [name] MD on 05/21/2009   Method used:   Electronically to        [facility] (retail)       [address]       [place], [place]  [address]       Ph: [phone]       Fax: [phone]   RxID:   [id number]      Flu Vaccine Consent Questions     Do you have a history of severe allergic reactions to this vaccine? no    Any prior history of allergic reactions to egg and/or gelatin? no    Do you have a sensitivity to the preservative Thimersol? no    Do you have a past history of Guillan-Barre Syndrome? no    Do you currently have an acute febrile illness? no    Have you ever had a severe reaction to latex? no    Vaccine information given and explained to patient? yes    Are you currently pregnant? no    Lot Number:[id number]   Exp Date:10/21/2009   Site Given  Left Deltoid IMflu

## 2010-05-24 NOTE — Miscellaneous (Signed)
Summary: Orders Update  Clinical Lists Changes  Orders: Added new Service order of EKG w/ Interpretation (93000) - Signed 

## 2010-05-24 NOTE — Assessment & Plan Note (Signed)
Summary: CPX-LB   Vital Signs:  Patient profile:   55 year old male Height:      71 inches Weight:      223 pounds BMI:     31.21 O2 Sat:      97 % on Room air Temp:     98.6 degrees F oral Pulse rate:   69 / minute BP sitting:   100 / 60  (left arm) Cuff size:   large  Vitals Entered By: Zella Ball Ewing CMA Duncan Dull) (November 19, 2009 10:35 AM)  O2 Flow:  Room air  Preventive Care Screening     declines colonoscopy  CC: ADult Physical/RE   Primary Care Provider:  [name] MD  CC:  ADult Physical/RE.  History of Present Illness: overall doing very well,  Pt denies CP, sob, doe, wheezing, orthopnea, pnd, worsening LE edema, palps, or syncope, but has significant occas dizziness after taking the hydralazine and has increased the dosing intervals he does to try to avoid this.  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 Take One Tablet By Mouth Daily 2)  Norvasc 5 Mg  Tabs (Amlodipine Besylate) .... Take 1 Tablet By Mouth Once A Day 3)  Coreg 25 Mg  Tabs (Carvedilol) .... Take 1 Tablet By Mouth Two Times A Day 4)  Lasix 40 Mg  Tabs (Furosemide) .... Take 1 Tablet By Mouth Once A Day 5)  Zestril 20 Mg  Tabs (Lisinopril) .Marland Kitchen.. 1 By Mouth Once Daily 6)  Zocor 40 Mg  Tabs (Simvastatin) .... Take 1 Tablet At Bedtime 7)  Klor-Con M20 20 Meq  Cr-Tabs (Potassium Chloride Crys Cr) .Marland Kitchen.. 1 By Mouth Once Daily 8)  Isosorbide Mononitrate Cr 60 Mg  Tb24 (Isosorbide Mononitrate) .Marland Kitchen.. 1 By Mouth Once Daily 9)  Metformin Hcl 500 Mg   Tabs (Metformin Hcl) .Marland Kitchen.. 1 By Mouth Once Daily 10)  Hydralazine Hcl 50 Mg Tabs (Hydralazine Hcl) .Marland Kitchen.. 1 By Mouth Three Times A Day  Current Medications (verified): 1)  Aspirin 81 Mg Tbec (Aspirin) .... Take One Tablet By Mouth Daily 2)  Amlodipine Besylate 5 Mg Tabs (Amlodipine Besylate) .Marland Kitchen.. 1po Once Daily 3)  Carvedilol 25 Mg Tabs (Carvedilol) .Marland Kitchen.. 1po Two Times A Day 4)  Lasix 40 Mg  Tabs (Furosemide) .... Take 1 Tablet By Mouth Once A Day 5)  Zestril 20 Mg  Tabs (Lisinopril) .Marland Kitchen.. 1 By Mouth Once Daily 6)  Zocor 40 Mg  Tabs (Simvastatin) .... Take 1 Tablet At Bedtime 7)  Klor-Con M20 20 Meq  Cr-Tabs (Potassium Chloride Crys Cr) .Marland Kitchen.. 1 By Mouth Once Daily 8)  Isosorbide Mononitrate Cr 60 Mg  Tb24 (Isosorbide Mononitrate) .Marland Kitchen.. 1 By Mouth Once Daily 9)  Metformin Hcl 500 Mg  Tabs (Metformin Hcl) .Marland Kitchen.. 1 By Mouth Once Daily 10)  Hydralazine Hcl 25 Mg Tabs (Hydralazine Hcl) .Marland Kitchen.. 1po Three Times A Day  Allergies (verified): No Known Drug Allergies  Past History:  Past Medical History: Last updated: 12/04/2008 Hypertension Hyperlipidemia hypertensive cardiomyopathy/CHF    --  resolved EF now 60-65% Diabetes mellitus, type II  Past Surgical History: Last updated: 11/12/2007 Tonsillectomy-1970s  Family History: Last updated: 12-06-09 father died in MVA when pt was 49 yo mother with renal failure - dialysis;died 2011-06-30brother died with substance abuse problem  Social History: Last updated: 11/12/2007 Divorced 1 son work - time Museum/gallery conservator Never Smoked Alcohol use-yes - social  Risk Factors: Smoking Status: never (11/12/2007)  Family History: father died in MVA when pt was 83 yo mother with renal failure - dialysis;died 2011/06/30brother died with substance abuse problem  Review of Systems  The patient denies anorexia, fever, weight loss, weight gain, vision loss, decreased hearing, hoarseness, chest pain, syncope, dyspnea on exertion, peripheral edema,  prolonged cough, headaches, hemoptysis, abdominal pain, melena, hematochezia, severe indigestion/heartburn, hematuria, muscle weakness, suspicious skin lesions, transient blindness, difficulty walking, depression, unusual weight change, abnormal bleeding, enlarged lymph nodes, angioedema, and breast masses.         all otherwise negative per pt -    Physical Exam  General:  alert and overweight-appearing.   Head:  normocephalic and atraumatic.   Eyes:  vision grossly intact, pupils equal, and pupils round.   Ears:  R ear normal and L ear normal.   Nose:  no external deformity and no nasal discharge.   Mouth:  no gingival abnormalities and pharynx pink and moist.   Neck:  supple and no masses.   Lungs:  normal respiratory effort and normal breath sounds.   Heart:  normal rate and regular rhythm.   Abdomen:  soft, non-tender, and normal bowel sounds.   Msk:  no joint tenderness and no joint swelling.   Extremities:  no edema, no erythema  Neurologic:  cranial nerves II-XII intact and strength normal in all extremities.     Impression & Recommendations:  Problem # 1:  Preventive Health Care (ICD-V70.0)  Overall doing well, age appropriate education and counseling updated and referral for appropriate preventive services done unless declined, immunizations up to date or declined, diet counseling done if overweight, urged to quit smoking if smokes , most recent labs reviewed and current ordered if appropriate, ecg reviewed or declined (interpretation per ECG scanned in the EMR if done); information regarding Medicare Prevention requirements given if appropriate; speciality referrals updated as appropriate   Orders: TLB-BMP (Basic Metabolic Panel-BMET) (80048-METABOL) TLB-CBC Platelet - w/Differential (85025-CBCD) TLB-Hepatic/Liver Function Pnl (80076-HEPATIC) TLB-Lipid Panel (80061-LIPID) TLB-TSH (Thyroid Stimulating Hormone) (84443-TSH) TLB-PSA (Prostate Specific Antigen)  (84153-PSA) TLB-Udip ONLY (81003-UDIP)  Problem # 2:  CONGESTIVE HEART FAILURE (ICD-428.0)  His updated medication list for this problem includes:    Aspirin 81 Mg Tbec (Aspirin) .Marland Kitchen... Take one tablet by mouth daily    Carvedilol 25 Mg Tabs (Carvedilol) .Marland Kitchen... 1po two times a day    Lasix 40 Mg Tabs (Furosemide) .Marland Kitchen... Take 1 tablet by mouth once a day    Zestril 20 Mg Tabs (Lisinopril) .Marland Kitchen... 1 by mouth once daily with some dizziness on the hydralazine and lower BP todya - will decrease to 25 three times a day, refer back to DrBensimohn as he is due for f/u aug 2011  Orders: Cardiology Referral (Cardiology) TLB-BNP (B-Natriuretic Peptide) (83880-BNPR)  Problem # 3:  HYPERTENSION (ICD-401.9)  His updated medication list for this problem includes:    Amlodipine Besylate 5 Mg Tabs (Amlodipine besylate) .Marland Kitchen... 1po once daily    Carvedilol 25 Mg Tabs (Carvedilol) .Marland Kitchen... 1po two times a day    Lasix 40 Mg Tabs (Furosemide) .Marland KitchenMarland KitchenMarland KitchenMarland Kitchen  Take 1 tablet by mouth once a day    Zestril 20 Mg Tabs (Lisinopril) .Marland Kitchen... 1 by mouth once daily    Hydralazine Hcl 25 Mg Tabs (Hydralazine hcl) .Marland Kitchen... 1po three times a day  BP today: 100/60 Prior BP: 104/70 (05/21/2009)  Labs Reviewed: K+: 3.9 (11/17/2009) Creat: : 1.4 (11/17/2009)   Chol: 130 (11/17/2009)   HDL: 33.50 (11/17/2009)   LDL: 76 (11/17/2009)   TG: 104.0 (11/17/2009) as above - o/w stable overall by hx and exam, ok to continue meds/tx as is   Problem # 4:  DIABETES MELLITUS, TYPE II (ICD-250.00)  His updated medication list for this problem includes:    Aspirin 81 Mg Tbec (Aspirin) .Marland Kitchen... Take one tablet by mouth daily    Zestril 20 Mg Tabs (Lisinopril) .Marland Kitchen... 1 by mouth once daily    Metformin Hcl 500 Mg Tabs (Metformin hcl) .Marland Kitchen... 1 by mouth once daily  Labs Reviewed: Creat: 1.4 (11/17/2009)    Reviewed HgBA1c results: 6.5 (11/17/2009)  6.2 (05/21/2009) stable overall by hx and exam, ok to continue meds/tx as is   Complete Medication List: 1)   Aspirin 81 Mg Tbec (Aspirin) .... Take one tablet by mouth daily 2)  Amlodipine Besylate 5 Mg Tabs (Amlodipine besylate) .Marland Kitchen.. 1po once daily 3)  Carvedilol 25 Mg Tabs (Carvedilol) .Marland Kitchen.. 1po two times a day 4)  Lasix 40 Mg Tabs (Furosemide) .... Take 1 tablet by mouth once a day 5)  Zestril 20 Mg Tabs (Lisinopril) .Marland Kitchen.. 1 by mouth once daily 6)  Zocor 40 Mg Tabs (Simvastatin) .... Take 1 tablet at bedtime 7)  Klor-con M20 20 Meq Cr-tabs (Potassium chloride crys cr) .Marland Kitchen.. 1 by mouth once daily 8)  Isosorbide Mononitrate Cr 60 Mg Tb24 (Isosorbide mononitrate) .Marland Kitchen.. 1 by mouth once daily 9)  Metformin Hcl 500 Mg Tabs (Metformin hcl) .Marland Kitchen.. 1 by mouth once daily 10)  Hydralazine Hcl 25 Mg Tabs (Hydralazine hcl) .Marland Kitchen.. 1po three times a day  Patient Instructions: 1)  Please decrease the hydralazine to 25 mg three times a day 2)  Continue all previous medications as before this visit 3)  Please go to the Lab in the basement for your blood and/or urine tests today  4)  You will be contacted about the referral(s) to: Cardiology - Dr Teressa Lower 5)  Please schedule a follow-up appointment in 6 mo with: 6)  HGBA1c: 250.02   7)  Lipids 8)  BMET Prescriptions: HYDRALAZINE HCL 25 MG TABS (HYDRALAZINE HCL) 1po three times a day  #270 x 3   Entered and Authorized by:   [name] MD   Signed by:   [name] MD on 11/19/2009   Method used:   Print then Give to Patient   RxID:   [id number] METFORMIN HCL 500 MG  TABS (METFORMIN HCL) 1 by mouth once daily  #90 x 3   Entered and Authorized by:   [name] MD   Signed by:   [name] MD on 11/19/2009   Method used:   Print then Give to Patient   RxID:   [id number] ISOSORBIDE MONONITRATE CR 60 MG  TB24 (ISOSORBIDE MONONITRATE) 1 by mouth once daily  #90 x 3   Entered and Authorized by:   [name] MD   Signed by:   [name] MD on 11/19/2009   Method used:   Print then Give to Patient   RxID:   [id number] KLOR-CON M20 20 MEQ   CR-TABS (  POTASSIUM CHLORIDE CRYS CR) 1 by mouth once daily  #90 x 3   Entered and Authorized by:   [name] MD   Signed by:   [name] MD on 11/19/2009   Method used:   Print then Give to Patient   RxID:   [id number] ZOCOR 40 MG  TABS (SIMVASTATIN) Take 1 tablet at bedtime  #90 x 3   Entered and Authorized by:   [name] MD   Signed by:   [name] MD on 11/19/2009   Method used:   Print then Give to Patient   RxID:   [id number] ZESTRIL 20 MG  TABS (LISINOPRIL) 1 by mouth once daily  #90 x 3   Entered and Authorized by:   [name] MD   Signed by:   [name] MD on 11/19/2009   Method used:   Print then Give to Patient   RxID:   [id number] LASIX 40 MG  TABS (FUROSEMIDE) Take 1 tablet by mouth once a day  #90 x 3   Entered and Authorized by:   [name] MD   Signed by:   [name] MD on 11/19/2009   Method used:   Print then Give to Patient   RxID:   [id number] CARVEDILOL 25 MG TABS (CARVEDILOL) 1po two times a day  #180 x 3   Entered and Authorized by:   [name] MD   Signed by:   [name] MD on 11/19/2009   Method used:   Print then Give to Patient   RxID:   [id number] AMLODIPINE BESYLATE 5 MG TABS (AMLODIPINE BESYLATE) 1po once daily  #90 x 3   Entered and Authorized by:   [name] MD   Signed by:   [name] MD on 11/19/2009   Method used:   Print then Give to Patient   RxID:   [id number]

## 2010-05-24 NOTE — Assessment & Plan Note (Signed)
Summary: A2Z      Allergies Added: NKDA  Visit Type:  1 YR F/U Primary Provider:  [name] MD  CC:  no cardiac complaints today.  History of Present Illness: Trevor Anderson is a very pleasant 55 year old male with a history of DM2, severe hypertension and nonischemic cardiomyopathy with ejection fraction of 25-30% in 4/09 with severe mitral regurgitation in setting of HTN crisis.  Cardiac catheterization 5/09 showed nonobstructive coronary artery disease. Fortunately, his LV function has recovered.  His EF has been normalized to 60%, there is now only trivial mitral regurgitation.  Last echo 7/10 showed EF 60-65% grade 1 diastolic dysfx and mild MR.  He returns for routine f/u. Feels great. Can do anything he wants without CP/SOB. SBP typically 110-120. No edema, orthopne or PND. Compliant with all meds.  Current Medications (verified): 1)  Aspirin 81 Mg Tbec (Aspirin) .... Take One Tablet By Mouth Daily 2)  Amlodipine Besylate 5 Mg Tabs (Amlodipine Besylate) .Marland Kitchen.. 1po Once Daily 3)  Carvedilol 25 Mg Tabs (Carvedilol) .Marland Kitchen.. 1po Two Times A Day 4)  Lasix 40 Mg  Tabs (Furosemide) .... Take 1 Tablet By Mouth Once A Day 5)  Zestril 20 Mg  Tabs (Lisinopril) .Marland Kitchen.. 1 By Mouth Once Daily 6)  Zocor 40 Mg  Tabs (Simvastatin) .... Take 1 Tablet At Bedtime 7)  Klor-Con M20 20 Meq  Cr-Tabs (Potassium Chloride Crys Cr) .Marland Kitchen.. 1 By Mouth Once Daily 8)  Isosorbide Mononitrate Cr 60 Mg  Tb24 (Isosorbide Mononitrate) .Marland Kitchen.. 1 By Mouth Once Daily 9)  Metformin Hcl 500 Mg  Tabs (Metformin Hcl) .Marland Kitchen.. 1 By Mouth Once Daily 10)  Hydralazine Hcl 25 Mg Tabs (Hydralazine Hcl) .Marland Kitchen.. 1po Three Times A Day  Allergies (verified): No Known Drug Allergies  Review of Systems       As per HPI and past medical history; otherwise all systems negative.   Vital Signs:  Patient profile:   55 year old male Height:      71 inches Weight:      228.8 pounds BMI:     32.03 Pulse rate:   61 / minute Pulse rhythm:   irregular BP  sitting:   120 / 80  (left arm) Cuff size:   large  Vitals Entered By: Danielle Rankin, CMA (January 24, 2010 9:02 AM)  Physical Exam  General:  Well appearing. no resp difficulty HEENT: normal Neck: supple. no JVD. Carotids 2+ bilat; no bruits. No lymphadenopathy or thryomegaly appreciated. Cor: PMI nondisplaced. Regular rate & rhythm. No rubs, gallops, murmur. Lungs: clear Abdomen: soft, nontender, nondistended. No hepatosplenomegaly. No bruits or masses. Good bowel sounds. Extremities: no cyanosis, clubbing, rash, edema Neuro: alert & orientedx3, cranial nerves grossly intact. moves all 4 extremities w/o difficulty. affect pleasant    Impression & Recommendations:  Problem # 1:  CARDIOMYOPATHY, SECONDARY (ICD-425.9) Likely due to HTN. Doing well. LV function has recovered with control of BP. Check echo to ensure stability of EF.  Problem # 2:  HYPERTENSION (ICD-401.9) Blood pressure well controlled. Continue current regimen.  Problem # 3:  THYROID FULLNESS Check TSH and thyroid u/s. F/u with PCP.   Other Orders: EKG w/ Interpretation (93000) Misc. Referral (Misc. Ref) Echocardiogram (Echo) TLB-T4 (Thyrox), Free [phone]) TLB-TSH (Thyroid Stimulating Hormone) [id number])  Patient Instructions: 1)  Labs today 2)  Your physician has requested that you have an echocardiogram.  Echocardiography is a painless test that uses sound waves to create images of your heart. It provides your doctor with  information about the size and shape of your heart and how well your heart's chambers and valves are working.  This procedure takes approximately one hour. There are no restrictions for this procedure. 3)  You have been referred to have a Thyroid Ultrasound 4)  Your physician wants you to follow-up in:  1 year.  You will receive a reminder letter in the mail two months in advance. If you don't receive a letter, please call our office to schedule the follow-up appointment.

## 2010-05-24 NOTE — Progress Notes (Signed)
----   Converted from flag ---- ---- 11/19/2009 12:20 PM, Edman Circle wrote: appt 8/3 @ 10:30  ---- 11/19/2009 10:59 AM, Dagoberto Reef wrote: Thanks  ---- 11/19/2009 10:48 AM, [name] MD wrote: The following orders have been entered for this patient and placed on Admin Hold:  Type:     Referral       Code:   Cardiology Description:   Cardiology Referral Order Date:   11/19/2009   Authorized By:   [name] MD Order #:   [id number] Clinical Notes:   dr Lorrin Jackson  - due f/u aug 2011 ------------------------------

## 2010-05-24 NOTE — Letter (Signed)
Summary: [facility] Referral (unable to schedule) Notification  [facility]  [address] [place], [place] [address]   Phone: [phone]  Fax: [phone]      June 22, 2009 Trevor Anderson 1956-01-09 MRN: [id number]   [facility] [address] [place], [place]  [address]   Dear Dr.Norman Bier:   Thank you for your kind referral of the above patient. We have attempted to schedule the recommended Colonoscopy but have been unable to schedule because:  _x_ The patient was not available by phone and/or has not returned our calls.  __ The patient declined to schedule the procedure at this time.  We appreciate the referral and hope that we will have the opportunity to treat this patient in the future.    Sincerely,   [facility]  Vania Rea. Jarold Motto M.D. Hedwig Morton. Juanda Chance M.D. Venita Lick. Russella Dar M.D. Wilhemina Bonito. Marina Goodell M.D. Barbette Hair. Arlyce Dice M.D. Iva Boop M.D. Cheron Every.D.

## 2010-06-01 NOTE — Assessment & Plan Note (Signed)
Summary: 6 MOS F/U #/CD   Vital Signs:  Patient profile:   55 year old male Height:      71.5 inches Weight:      228.50 pounds BMI:     31.54 O2 Sat:      97 % on Room air Temp:     98.4 degrees F oral Pulse rate:   71 / minute BP sitting:   100 / 64  (left arm) Cuff size:   small  Vitals Entered By: Zella Ball Ewing CMA (AAMA) (May 23, 2010 10:03 AM)  O2 Flow:  Room air CC: 6 month followup/RE   Primary Care Provider:  [name] MD  CC:  6 month followup/RE.  History of Present Illness: here to f/u- overall doing ok, Pt denies CP, worsening sob, doe, wheezing, orthopnea, pnd, worsening LE edema, palps, dizziness or syncope  Pt denies new neuro symptoms such as headache, facial or extremity weakness  Pt denies polydipsia, polyuria, or low sugar symptoms such as shakiness improved with eating.  Overall good compliance with meds, trying to follow low chol, DM diet, wt icnreased 5 lbs, little excercise however  CBG's in the lower 100's.  Overall good compliance with meds, and good tolerability.  No fever, wt loss, night sweats, loss of appetite or other constitutional symptoms  Denies worsening depressive symptoms, suicidal ideation, or panic.   Preventive Screening-Counseling & Management      Drug Use:  no.    Problems Prior to Update: 1)  Tachycardia  (ICD-785) 2)  Cardiomyopathy, Secondary  (ICD-425.9) 3)  Preventive Health Care  (ICD-V70.0) 4)  Uri  (ICD-465.9) 5)  Diabetes Mellitus, Type II  (ICD-250.00) 6)  Congestive Heart Failure  (ICD-428.0) 7)  Preventive Health Care  (ICD-V70.0) 8)  Hyperlipidemia  (ICD-272.4) 9)  Hypertension  (ICD-401.9)  Medications Prior to Update: 1)  Aspirin 81 Mg Tbec (Aspirin) .... Take One Tablet By Mouth Daily 2)  Amlodipine Besylate 5 Mg Tabs (Amlodipine Besylate) .Marland Kitchen.. 1po Once Daily 3)  Carvedilol 25 Mg Tabs (Carvedilol) .Marland Kitchen.. 1po Two Times A Day 4)  Lasix 40 Mg  Tabs (Furosemide) .... Take 1 Tablet By Mouth Once A Day 5)   Zestril 20 Mg  Tabs (Lisinopril) .Marland Kitchen.. 1 By Mouth Once Daily 6)  Zocor 40 Mg  Tabs (Simvastatin) .... Take 1 Tablet At Bedtime 7)  Klor-Con M20 20 Meq  Cr-Tabs (Potassium Chloride Crys Cr) .Marland Kitchen.. 1 By Mouth Once Daily 8)  Isosorbide Mononitrate Cr 60 Mg  Tb24 (Isosorbide Mononitrate) .Marland Kitchen.. 1 By Mouth Once Daily 9)  Metformin Hcl 500 Mg  Tabs (Metformin Hcl) .Marland Kitchen.. 1 By Mouth Once Daily 10)  Hydralazine Hcl 25 Mg Tabs (Hydralazine Hcl) .Marland Kitchen.. 1po Three Times A Day  Current Medications (verified): 1)  Aspirin 81 Mg Tbec (Aspirin) .... Take One Tablet By Mouth Daily 2)  Amlodipine Besylate 5 Mg Tabs (Amlodipine Besylate) .Marland Kitchen.. 1po Once Daily 3)  Carvedilol 25 Mg Tabs (Carvedilol) .Marland Kitchen.. 1po Two Times A Day 4)  Lasix 40 Mg  Tabs (Furosemide) .... Take 1 Tablet By Mouth Once A Day 5)  Zestril 20 Mg  Tabs (Lisinopril) .Marland Kitchen.. 1 By Mouth Once Daily 6)  Zocor 40 Mg  Tabs (Simvastatin) .... Take 1 Tablet At Bedtime 7)  Klor-Con M20 20 Meq  Cr-Tabs (Potassium Chloride Crys Cr) .Marland Kitchen.. 1 By Mouth Once Daily 8)  Isosorbide Mononitrate Cr 60 Mg  Tb24 (Isosorbide Mononitrate) .Marland Kitchen.. 1 By Mouth Once Daily 9)  Metformin Hcl 500 Mg  Tabs (Metformin Hcl) .Marland Kitchen.. 1 By Mouth Once Daily 10)  Hydralazine Hcl 25 Mg Tabs (Hydralazine Hcl) .Marland Kitchen.. 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   Head:  normocephalic and atraumatic.   Eyes:  vision grossly intact, pupils equal, and pupils round.   Ears:  R ear normal and L ear normal.   Nose:  no external deformity and no nasal discharge.   Mouth:  no gingival abnormalities and pharynx pink and moist.   Neck:  supple and no masses.  no JVD.   Lungs:  normal respiratory effort and normal breath sounds.   Heart:  normal rate and regular rhythm.   Extremities:  no edema, no erythema    Impression & Recommendations:  Problem # 1:  DIABETES MELLITUS, TYPE II (ICD-250.00)  His updated medication list for this problem includes:    Aspirin 81 Mg Tbec (Aspirin) .Marland Kitchen... Take one tablet by mouth daily    Zestril 20 Mg Tabs (Lisinopril) .Marland Kitchen... 1 by mouth once daily    Metformin Hcl 500 Mg Tabs (Metformin hcl) .Marland Kitchen... 1 by mouth once daily  Labs Reviewed: Creat: 1.4 (11/17/2009)    Reviewed HgBA1c results: 6.5 (11/17/2009)  6.2 (05/21/2009) stable overall by hx and exam, ok to continue meds/tx as is but did gain 5 lbs - Pt to cont DM diet, excercise, wt control efforts; to check labs today   Orders: TLB-BMP (Basic Metabolic Panel-BMET) (80048-METABOL) TLB-Lipid Panel (80061-LIPID) TLB-A1C / Hgb A1C (Glycohemoglobin) (83036-A1C)  Problem # 2:  HYPERTENSION (ICD-401.9)  His updated medication list for this problem includes:    Amlodipine Besylate 5 Mg Tabs (Amlodipine besylate) .Marland Kitchen... 1po once daily    Carvedilol 25 Mg Tabs (Carvedilol) .Marland Kitchen... 1po two times a day    Lasix 40 Mg Tabs (Furosemide) .Marland Kitchen... Take 1 tablet by mouth once a day    Zestril 20 Mg Tabs (Lisinopril) .Marland Kitchen... 1 by mouth once daily    Hydralazine Hcl 25 Mg Tabs (Hydralazine hcl) .Marland Kitchen... 1po three times a day  BP today: 100/64 Prior BP: 120/80 (01/24/2010)  Labs Reviewed: K+: 3.9 (11/17/2009) Creat: : 1.4 (11/17/2009)   Chol: 130 (11/17/2009)   HDL: 33.50 (11/17/2009)   LDL: 76 (11/17/2009)    TG: 104.0 (11/17/2009) stable overall by hx and exam, ok to continue meds/tx as is   Problem # 3:  HYPERLIPIDEMIA (ICD-272.4)  His updated medication list for this problem includes:    Zocor 40 Mg Tabs (Simvastatin) .Marland Kitchen... Take 1 tablet at bedtime  Labs Reviewed: SGOT: 18 (11/17/2009)   SGPT: 9 (11/17/2009)   HDL:33.50 (11/17/2009), 38.50 (05/21/2009)  LDL:76 (11/17/2009), 61 (05/21/2009)  Chol:130 (11/17/2009), 113 (05/21/2009)  Trig:104.0 (11/17/2009), 67.0 (05/21/2009) stable overall by hx and exam, ok to continue meds/tx as is  - Pt to continue diet efforts, good med tolerance;  to check labs - goal LDL less than 70   Problem # 4:  CONGESTIVE HEART FAILURE (ICD-428.0)  His updated medication list for this problem includes:    Aspirin 81 Mg Tbec (Aspirin) .Marland Kitchen... Take one tablet by mouth daily    Carvedilol 25 Mg Tabs (Carvedilol) .Marland Kitchen... 1po two times a day    Lasix 40 Mg Tabs (Furosemide) .Marland Kitchen... Take 1 tablet by mouth once a day    Zestril 20 Mg Tabs (Lisinopril) .Marland Kitchen... 1 by mouth once daily  Echocardiogram:  Study Conclusions    1. Left ventricle: The cavity size was normal. Wall thickness was      increased in a pattern of mild LVH. Systolic function was normal.      The estimated ejection fraction was in the range of 60% to 65%.      Wall motion was normal; there were no regional wall motion      abnormalities. Doppler parameters are consistent with abnormal      left ventricular relaxation (grade 1 diastolic dysfunction).   2. Mitral valve: Mild regurgitation.   3. Left atrium: The atrium was mildly dilated.   4. Inferior vena cava: The vessel was normal in size; the      respirophasic diameter changes were in the normal range (= 50%);      findings are consistent with normal central venous pressure.   Impressions:    - Normal LV size and systolic function, EF 60-65% with no wall     motion abnormalities. Mild mitral regurgitation. Mild left atrial     enlargement.    Echocardiography. M-mode, complete 2D, spectral Doppler, and color   Doppler. Height: Height: 182.9cm. Height: 72in. Weight: Weight:   95.3kg. Weight: 209.6lb. Body mass index: BMI: 28.5kg/m^2. Body   surface area: BSA: 2.75m^2. Patient status: Outpatient. Location:   [facility]. Marca Ancona, MD (10/23/2008) stable overall by hx and exam, ok to continue meds/tx as is   Complete Medication List: 1)  Aspirin 81 Mg Tbec (Aspirin) .... Take one tablet by mouth daily 2)  Amlodipine Besylate 5 Mg Tabs (Amlodipine besylate) .Marland Kitchen.. 1po once daily 3)  Carvedilol 25 Mg Tabs (Carvedilol) .Marland Kitchen.. 1po two times a day 4)  Lasix 40 Mg Tabs (Furosemide) .... Take 1 tablet by mouth once a day 5)  Zestril 20 Mg Tabs (Lisinopril) .Marland Kitchen.. 1 by mouth once daily 6)  Zocor 40 Mg Tabs (Simvastatin) .... Take 1 tablet at bedtime 7)  Klor-con M20 20 Meq Cr-tabs (Potassium chloride crys cr) .Marland Kitchen.. 1 by mouth once daily 8)  Isosorbide Mononitrate Cr 60 Mg Tb24 (Isosorbide mononitrate) .Marland Kitchen.. 1 by mouth once daily 9)  Metformin Hcl 500 Mg Tabs (Metformin hcl) .Marland Kitchen.. 1 by mouth once daily 10)  Hydralazine Hcl 25 Mg Tabs (Hydralazine hcl) .Marland Kitchen.. 1po three times a day  Patient Instructions: 1)  Continue all previous medications as before this visit 2)  All of your medications were sent to the pharmacy today 3)  Please go to the Lab in the basement for your blood and/or urine tests today 4)  Please call the number on the [facility] Card for results of your testing  5)  Please schedule a follow-up appointment in 6 months for CPX iwth labs and: 6)  HbgA1C prior to visit, ICD-9: 250.02 7)  Urine Microalbumin prior to visit, ICD-9: Prescriptions: HYDRALAZINE HCL 25 MG TABS (HYDRALAZINE HCL) 1po three times a day  #270 x 3   Entered and Authorized by:   Len Blalock  Orson Rho MD   Signed by:   [name] MD on 05/23/2010   Method used:   Electronically to        [facility] (retail)       [address]       [place], [place]  [address]       Ph: [phone]       Fax: [phone]   RxID:   [id number] METFORMIN HCL 500 MG  TABS (METFORMIN HCL) 1 by mouth once daily  #90 x 3   Entered and Authorized by:   [name] MD   Signed by:   [name] MD on 05/23/2010   Method used:   Electronically to        [facility] (retail)       [address]       [place], [place]  [address]       Ph: [phone]       Fax: [phone]   RxID:   [id number] ISOSORBIDE MONONITRATE CR 60 MG  TB24 (ISOSORBIDE MONONITRATE) 1 by mouth once daily  #90 x 3   Entered and Authorized by:   [name] MD   Signed by:   [name] MD on 05/23/2010   Method used:   Electronically to        [facility] (retail)       [address]       [place], [place]  [address]       Ph: [phone]       Fax: [phone]   RxID:   [id number] KLOR-CON M20 20 MEQ  CR-TABS (POTASSIUM CHLORIDE CRYS CR) 1 by mouth once daily  #90 x 3   Entered and Authorized by:   [name] MD   Signed by:   [name] MD on 05/23/2010   Method used:   Electronically to        [facility] (retail)       [address]       [place], [place]  [address]       Ph: [phone]       Fax: [phone]   RxID:   [id number] ZOCOR 40 MG  TABS (SIMVASTATIN) Take 1 tablet at bedtime  #90 x 3   Entered and Authorized by:   [name] MD   Signed by:   [name] MD on 05/23/2010   Method used:   Electronically to        [facility] (retail)       [address]       [place], [place]  [address]       Ph: [phone]       Fax: [phone]   RxID:   [id number] ZESTRIL 20 MG  TABS (LISINOPRIL) 1 by mouth once daily  #90 x 3   Entered and Authorized by:   [name] MD   Signed by:   [name] MD on 05/23/2010   Method used:   Electronically to        [facility] (retail)        [address]  [place], [place]  [address]       Ph: [phone]       Fax: [phone]   RxID:   [id number] LASIX 40 MG  TABS (FUROSEMIDE) Take 1 tablet by mouth once a day  #90 x 3   Entered and Authorized by:   [name] MD   Signed by:   [name] MD on 05/23/2010   Method used:   Electronically to        [facility] (retail)       [address]       [place], [place]  [address]       Ph: [phone]       Fax: [phone]   RxID:   [id number] CARVEDILOL 25 MG TABS (CARVEDILOL) 1po two times a day  #180 x 3   Entered and Authorized by:   [name] MD   Signed by:   [name] MD on 05/23/2010   Method used:   Electronically to        [facility] (retail)       [address]       [place], [place]  [address]       Ph: [phone]       Fax: [phone]   RxID:   [id number] AMLODIPINE BESYLATE 5 MG TABS (AMLODIPINE BESYLATE) 1po once daily  #90 x 3   Entered and Authorized by:   [name] MD   Signed by:   [name] MD on 05/23/2010   Method used:   Electronically to        [facility] (retail)       [address]       [place], [place]  [address]       Ph: [phone]       Fax: [phone]   RxID:   [id number]    Orders Added: 1)  TLB-BMP (Basic Metabolic Panel-BMET) [80048-METABOL] 2)  TLB-Lipid Panel [80061-LIPID] 3)  TLB-A1C / Hgb A1C (Glycohemoglobin) [83036-A1C] 4)  Est. Patient Level IV [01751]

## 2010-09-06 NOTE — Cardiovascular Report (Signed)
Trevor Anderson, Trevor Anderson                 ACCOUNT NO.:  [id number]   MEDICAL RECORD NO.:  [id number]           PATIENT TYPE:   LOCATION:                                 FACILITY:   PHYSICIAN:  Trevor Fells. Excell Seltzer, MD  DATE OF BIRTH:  January 19, 1956   DATE OF PROCEDURE:  DATE OF DISCHARGE:                            CARDIAC CATHETERIZATION   PROCEDURE:  1. Right heart catheterization.  2. Left heart catheterization.  3. Selective coronary angiography.  4. StarClose of the right femoral artery.   INDICATIONS:  Trevor Anderson is a 55 year old gentleman who presented on  August 21, 2007, with congestive heart failure.  He has had severe  hypertension.  He also has been found to have chronic kidney disease and  a nephrology consult has been obtained.  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  He has had intermittent chest pain over the last  several weeks.  He was referred for right and left heart catheterization  to better assess his hemodynamics as well as coronary anatomy.  He  underwent an echocardiogram that showed severely reduced LVEF.   Risks and indications of procedure reviewed with the patient.  Informed  consent was obtained.  The right groin was prepped, draped, and  anesthetized with 1% lidocaine.  Using the modified Seldinger technique,  a 6-French sheath was placed in the right femoral artery and 7-French  sheath was placed in the right femoral vein.  A Swan-Ganz catheter was  used for the right heart catheterization, which was performed using  normal technique.  Oxygen saturations were drawn from the pulmonary  artery and aorta.  Fick cardiac output was calculated.  A coronary  angiography was performed with standard 6-French Judkins catheters.  The  right coronary artery had a high anterior takeoff, and an AL-2 catheter  was used to selectively engage that vessel.  The pigtail catheter was  used to record left ventricular pressure.  A left ventriculogram was  deferred because of  chronic kidney disease.  The patient tolerated the  procedure well and had no immediate complications.  All catheter  exchanges were performed over a guidewire.  At the completion of the  procedure. a StarClose device was used to seal the femoral arteriotomy.   FINDINGS:  Right atrial pressure mean of 20, right ventricular pressure  44/23, pulmonary artery pressure 44/27 with a mean of 35, pulmonary  capillary wedge pressure 23, left ventricular pressure 160/27, aortic  pressure 157/113 with a mean of 133.   Oxygen saturation pulmonary artery 57% and aorta 95%.   Cardiac output is 5 L per minute.  Cardiac index 2.2 L per minute per  meter squared.   CORONARY ANGIOGRAPHY:  Left mainstem.  The left main is widely patent.  There are no significant stenoses.  The left main trifurcates into the  intermediate and LAD and left circumflex.   LAD.  The LAD is a large-caliber vessel.  It courses down to the apex  and wraps around the apex, supplying the inferoapical portion as well.  There is mild nonobstructive plaque throughout the course of the LAD  proximally.  There is a long 20% stenosis, and in the midportion of the  vessel there is a smooth 40% focal stenosis.  There are 3 diagonal  branches supplied by the LAD.  All the diagonals are small.  The first  diagonal has diffuse nonobstructive plaque.   Left circumflex.  There is an intermediate branch that has moderate  stenosis at the ostium of 50%.  The intermediate bifurcates into twin  vessels.  The inferior branch has long diffuse stenosis of 60% that  leads into a 95% severe stenosis.  The distal vessel is a moderate-sized  vessel.  The superior branch of the intermediate has no significant  stenosis.   The remaining AV groove.  Left circumflex is widely patent with mild  nonobstructive plaque in the midportion of no worse than 20%-30%.  There  is an atrial branch and a left posterolateral branch.   Right coronary artery.  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  There is only minor nonobstructive plaque throughout  the mid portion of the vessel.  There are 2 RV marginal branches.  The  first RV marginal is the larger of the 2 and has moderate diffuse  disease.  The right coronary artery proper has no significant stenosis  throughout.  It supplies a small PDA.   ASSESSMENT:  1. Congestive heart failure with markedly elevated right heart      pressures.  2. Severe ramus intermediate stenosis.  3. Nonobstructive left anterior descending, left circumflex, and right      coronary artery stenoses.   PLAN:  Trevor Anderson needs continued diuresis and aggressive medical therapy  for his heart failure in order to bring his filling pressures down.  He  likely would benefit from PCI of his severe stenosis in the intermediate  branch.  I think this should be deferred initially in order to give a  few weeks in between contrast loads.  I will review his clinical  situation in films with colleagues to determine further plans.  He  otherwise has no significant CAD and clearly his cardiomyopathy is not  ischemic in nature.  The main rationale to treat his intermediate  stenosis would be because of his presentation of exertional chest pain.      Trevor Fells. Excell Seltzer, MD  Electronically Signed     MDC/MEDQ  D:  08/26/2007  T:  08/27/2007  Job:  [id number]

## 2010-09-06 NOTE — Discharge Summary (Signed)
Trevor Anderson, Trevor Anderson                 ACCOUNT NO.:  [id number]   MEDICAL RECORD NO.:  [id number]          PATIENT TYPE:  INP   LOCATION:  3701                         FACILITY:  [facility]   PHYSICIAN:  Jonelle Sidle, MD DATE OF BIRTH:  03/06/1956   DATE OF ADMISSION:  08/21/2007  DATE OF DISCHARGE:  08/28/2007                               DISCHARGE SUMMARY   PRIMARY CARDIOLOGIST:  Jonelle Sidle, MD   PRIMARY CARE PHYSICIAN:  He is followed by Deren[facility] Mis p.r.n.   CONSULTING PHYSICIAN:  Maree Krabbe, MD from Nephrology.   PROCEDURES PERFORMED DURING HOSPITALIZATION:  1. Cardiac catheterization, both right and left heart, performed by      Dr. Tonny Bollman on Aug 26, 2007.      a.     Congestive heart failure with a markedly elevated right       heart pressure, severe ramus intermediate stenosis, nonobstructive       left anterior descending, left circumflex and right coronary       artery stenosis.      b.     Echocardiogram completed on August 21, 2007.  Results       revealing left ventricular size his upper limits of normal,       overall left ventricular systolic function was moderately to       markedly decreased, left ventricular ejection fraction was       estimated range being 25-30% with moderate diffuse left       ventricular hypokinesis, left ventricular wall thickness was       moderately increased.  It was also noted that there was mild       restriction of the posterior mitral valve leaflet motion with       moderate to severe mitral valvular regurgitation.  Effective       orifice of the mitral regurgitation by proximal isovelocity       surface area was 0.14 cm2.  The volume of mitral regurgitation by       proximal isovelocity surface area was 24 mL.   FINAL DISCHARGE DIAGNOSIS:  1. Acute on chronic systolic congestive heart failure.   SECONDARY DIAGNOSES:  1. Hypertensive urgency.  2. Nonischemic cardiomyopathy, ejection fraction of 25-30%.  3.  Coronary artery disease with 95% ramus intermedius and      nonobstructive coronary artery disease elsewhere, to be managed      medically.  4. Renal insufficiency with creatinine of 1.6.  5. Moderate to severe mitral regurgitation by echocardiography.  6. Hypercholesterolemia with an LDL of 121.   HOSPITAL COURSE:  This is a 55 year old African-American male with no  prior history of cardiac disease or diabetes who has not been on any  regular medications prior to admission who was admitted with chest pain  and dyspnea which increased over 1 week on and off.  The patient  admitted to several months of pressure in his chest on and off that has  become more problematic and progressive.  He was seen in the emergency  room and was found to be hypertensive  with a blood pressure of 220/110.  He had an EKG which did not show any acute ST-T wave changes.  The  patient was given aspirin, 80 mg of Lasix and Lopressor 5 mg prior to  our evaluation in him by ER physician.  He was also given clonidine 0.1  mg p.o. and an additional Lopressor 5 mg after being seen.  The patient  was also placed on nitroglycerin and was titrated for blood pressure  control.   The patient was admitted by Dr. Nona Dell.  The patient was  diuresed with IV Lasix.  Admission weight was 113.17 kg.  The patient  diuresed approximately 2000 mL during hospitalization.  The patient was  also seen by Dr. Arlean Hopping for nephrology secondary to renal insufficiency  in the setting of uncontrolled hypertension.  Renal ultrasound was  requested per Dr. Arlean Hopping.  The findings revealed normal examination.  The patient also underwent a ventilation perfusion lung scan during  hospitalization to rule out pulmonary embolus following an elevated d-  dimer, and this was low probability.   During hospitalization, the patient was initiated and titrated on  medical therapy for hypertension.  He was placed on hydralazine 100 mg  t.i.d.  and Coreg 12.5 mg twice a day as nitroglycerin was weaned off.  He had no further complaints of chest pain and began to feel better with  diuresis and better blood pressure control.  The patient was followed  over the hospitalization by our cardiologists and blood pressure began  to normalize on the third day.  Once this blood pressure was normalized,  he was scheduled for cardiac catheterization.   The patient did undergo cardiac catheterization on Aug 26, 2007,  revealing elevated right heart pressure, severe intermediate stenosis  with nonobstructive LAD, left circumflex and right coronary artery  stenoses.  Medical therapy was adopted as an initial management  strategy.   The patient's blood pressure continued to improve.  The patient's  medications were also adjusted to increase his Coreg to 25 mg twice a  day.  The patient will also have addition of Norvasc 5 mg once a day.  BUN and creatinine remained stable with a creatinine of 1.69 on day of  discharge.  The patient was also started on Zocor secondary to  dyslipidemia with an LDL of 121, and HDL of 25.   The patient was seen and examined by Dr. Nona Dell on day of  discharge and found to be stable.  Blood pressure was 130-150 systolic  over 90-100 diastolic.  His O2 sat was 98% on room air.  His weight  decreased 1 kg from admission.  Potassium, BUN and creatinine were  stable.  The patient is to follow up with Dr. Diona Browner in the office  with a followup BMET and BNP at that time.  Dr. Diona Browner is also taking  the FMLA paper work back to the office to be filled out.  The patient is  to follow to pick that up and he will be out of work until being seen  and released by Dr. Diona Browner in 2 weeks.   DISCHARGE LABS:  BNP 446, sodium 140, potassium 3.6, chloride 105, CO2  27, glucose 133, BUN 20, creatinine 1.69.  Hemoglobin 12.3, hematocrit  37.7, white blood cells 6.9, platelets 270 cholesterol 161,  triglycerides 76, HDL  25, LDL 121and  hemoglobin A1c 6.5.  TSH 2.544.  EKG revealed normal sinus rhythm with nonspecific T-wave abnormalities.  Discharge weight 112 kg  DISCHARGE MEDICATIONS:  1. Aspirin 325 mg daily.  2. Norvasc 5 mg daily.  3. Hydralazine 100 mg three times a day.  4. Digoxin 0.125 mg every other day.  5. Coreg 25 mg twice a day.  6. Imdur 90 mg daily.  7. Lasix 40 mg daily.  8. Potassium 20 mEq daily.  9. Zestril 5 mg daily.  10.Zocor 40 mg at bedtime.   ALLERGIES:  No known drug allergies.   FOLLOW-UP PLANS AND APPOINTMENTS:  1. The patient will followed by Dr. Nona Dell on Tuesday, Sep 10, 2007, at 2:45 p.m.  2. A BMET and a BNP will be drawn during that office visit.  3. The patient has been advised to weigh himself daily and if he gains      over 2 to 3 pounds within 24 hours, he is to call our office for      adjustment of Lasix.  4. The patient has been advised to avoid high salt foods and to      monitor his blood pressure.  5. The patient has been advised to find a primary care physician for      continued medical management and workup for diabetes.   Time spent with the patient to include physician time 40 minutes.      Bettey Mare. Lyman Bishop, NP      Jonelle Sidle, MD  Electronically Signed    KML/MEDQ  D:  08/28/2007  T:  08/28/2007  Job:  [id number]

## 2010-09-06 NOTE — Assessment & Plan Note (Signed)
[facility]                            CARDIOLOGY OFFICE NOTE   SHERLOCK, NANCARROW                          MRN:          [id number]  DATE:01/30/2008                            DOB:          1955-10-21    PRIMARY CARE PHYSICIAN:  [name], MD   INTERVAL HISTORY:  Trevor Anderson is a very pleasant 55 year old male with a  history of severe hypertension and nonischemic cardiomyopathy with an  ejection fraction of 25-30% with severe mitral regurgitation.  Cardiac  catheterization showed nonobstructive coronary artery disease.  He also  has mild renal insufficiency.  Fortunately, his LV function has  recovered.  EF has been 60% with only trivial mitral regurgitation.   He returns today for routine followup.  He is doing great.  He has been  compliant with all his meds and he says he absolutely feels wonderful.  He can do anything he wants to do.  He has not had any chest pain,  shortness of breath, orthopnea, or lower extremity edema.   REVIEW OF SYSTEMS:  All systems negative.   CURRENT MEDICATIONS:  1. Hydralazine 100 t.i.d.  2. Norvasc 5 a day.  3. Coreg 25 b.i.d.  4. Zocor 40 a day.  5. Aspirin 325 a day.  6. Digoxin 0.125 a day.  7. Imdur 30 a day.  8. Potassium 20 a day.  9. Lasix 40 a day.  10.Zestril 20 a day.   PHYSICAL EXAMINATION:  GENERAL:  He is well appearing, ambulates around  the clinic briskly without any respiratory difficulty.  VITAL SIGNS:  Blood pressure is 130/74, heart rate 65, weight is 225.  HEENT:  Normal.  NECK:  Supple.  No JVD.  Carotids are 2+ bilaterally without bruits.  There is no lymphadenopathy or thyromegaly.  CARDIAC:  PMI is nondisplaced.  He has regular rate and rhythm.  No  murmurs, rubs, or gallops.  LUNGS:  Clear.  ABDOMEN:  Soft, nontender, nondistended, no hepatosplenomegaly, no  bruits, no masses.  Good bowel sounds.  EXTREMITIES:  Warm with no cyanosis, clubbing, or edema.  No rash.  NEURO:  Alert  and oriented x3.  Cranial nerves II through XII are  intact.  Moves all 4 extremities without difficulty.  Affect is  pleasant.   EKG shows normal sinus rhythm with T-wave inversions in V5 and V6 which  are old.   ASSESSMENT/PLAN:  1. Congestive heart failure secondary to nonischemic cardiomyopathy.      His myopathy has resolved.  He is [place] Heart Association class      I.  He is euvolemic.  He is absolutely doing great.  Continue      current therapy.  We can stop his digoxin.  I suspect we may be      able to stop his Lasix at subsequent visits.  We would continue his      other medications for now.  2. Hypertension, well controlled.  3. Hyperlipidemia, this is followed by Dr. Jonny Ruiz.  4. Coronary artery disease.  This is primarily nonobstructive,  continue medical therapy.   DISPOSITION:  I will see him back in 3 months for routine followup.     Bevelyn Buckles. Bensimhon, MD  Electronically Signed    DRB/MedQ  DD: 01/30/2008  DT: 01/30/2008  Job #: [id number]

## 2010-09-06 NOTE — H&P (Signed)
Trevor Anderson, Trevor Anderson NO.:  [id number]   MEDICAL RECORD NO.:  [id number]          PATIENT TYPE:  INP   LOCATION:  2921                         FACILITY:  [facility]   PHYSICIAN:  Jonelle Sidle, MD DATE OF BIRTH:  12/15/1955   DATE OF ADMISSION:  08/21/2007  DATE OF DISCHARGE:                              HISTORY & PHYSICAL   PRIMARY CARDIOLOGIST:  Jonelle Sidle, MD   PRIMARY CARE PHYSICIAN:  He has none but is followed by [facility] p.r[facility].   HISTORY OF PRESENT ILLNESS:  This is a 55 year old African American male  with no prior history of coronary artery disease with remote history of  borderline hypertension although not on any regular medications, with  complaints of severe chest pain x1 week on and off x3 weeks, but admits  to several months of pressure in his chest on and off with more probing.  This has been progressive.  This weekend, he states that his chest pain  became very bad, usually associated with exertion, lasting hours at a  time, described as tightness.  He also noted over the last week that he  had some stomach bloating.  He had complaints of PND within the last 24  hours and was unable to sleep last night secondary to symptoms.  He has  not been seen by a physician in greater than 5 years.  As a results of  these symptoms, he went to see the physician at [facility] and was found  to be hypertensive at that time with a blood pressure of 220/110.  The  patient was given 325 mg p.o. of aspirin and had an EKG, which did not  show any acute ST-T wave changes.  He was transferred to [facility] ER  via EMS and we are seeing him here.  The patient was seen initially by  the ER physician, Dr. Lynelle Doctor and was given 80 mg of IV Lasix and  Lopressor 5 mg p.o. prior to our seeing him.   REVIEW OF SYSTEMS:  Positive for chest pain, shortness of breath,  dyspnea on exertion, abdominal distention, and dizziness.  He denies any  other symptoms.  All other  systems are reviewed and found to be  negative.   PAST MEDICAL HISTORY:  Remote history of hypertension borderline, on  no meds, otherwise negative.   SOCIAL HISTORY:  He lives in [place] with his son.  He works at [employer].  He does not smoke.  EtOH occasionally.  He denies any use  of drugs.   FAMILY HISTORY:  Mother with endstage renal disease, on dialysis.  Father died of MVA.  He has 1 brother who died of drug abuse.   CURRENT MEDICATIONS:  None.   ALLERGIES:  None.   PHYSICAL EXAMINATION:  VITAL SIGNS:  Blood pressure 210/110, pulse 120,  respirations 36, temperature 98.3, and O2 sat 93% on room air, 100% on 2  L.  Weight 257 pounds.  HEENT:  Head is normocephalic and atraumatic.  Eyes:  PERRLA.  Mucous  membranes of mouth are pink and moist.  Tongue is midline.  NECK:  Supple.  There is no JVD and no thyromegaly, unable to auscultate  carotid bruits.  CARDIOVASCULAR:  Tachycardic with S4, soft systolic murmur auscultated  without rubs.  LUNGS:  Diminished breath sounds bibasilar without wheezes, rales, or  rhonchi.  ABDOMEN:  Distended, 2+ bowel sounds without rebound or guarding.  EXTREMITIES:  No clubbing, cyanosis, or edema.  NEURO:  Cranial nerves II-XII are grossly intact.   CURRENT LABS:  BMET is pending.  It has been drawn but hemolyzed and had  to be redrawn.  Cardiac enzymes are pending.  Hemoglobin 13.3,  hematocrit 40.0, white blood cells 8.3, and platelets 294.   Chest x-ray reveals a large pericardial silhouette, pericardial effusion  cannot be ruled out.  Pulmonary vascular congestion with bibasilar  atelectasis is noted.  EKG revealing sinus tachycardia with ventricular  rate of 118 beats per minute with left axis deviation with T-wave  inversion in V5 and V6.   IMPRESSION:  1. Hypertensive urgency associated with possible acute systolic heart      failure.  2. Chest pain syndrome.  3. Obesit   PLAN:  The patient has been seen and  examined by myself and Dr. Nona Dell.  Prior to being seen by Dr. Diona Browner, I administered an  additional dose of  Lopressor 5 mg IV x1.  I increased his  nitroglycerine to titrate for blood pressure control.  It is now 140/97  and his heart rate is in the 90s.  The patient was given 0.1 mg dose of  clonidine p.o. x1.   Dr. Diona Browner has recommended admission to stepdown unit.  The patient  has associated clinical CHF, suspected cardiomyopathy based on heart  size per chest x-ray.  We will treat with aspirin, nitroglycerine drip,  hydralazine for now, and Lasix.  Need 2-D echo today to assess LV  function, follow-up baseline labs, and monitor cardiac enzymes to make  further recommendations depending upon hospital course and the patient's  response to his treatment.      Bettey Mare. Lyman Bishop, NP      Jonelle Sidle, MD  Electronically Signed    KML/MEDQ  D:  08/21/2007  T:  08/22/2007  Job:  [id number]

## 2010-09-06 NOTE — Assessment & Plan Note (Signed)
[facility]                            CARDIOLOGY OFFICE NOTE   JAKARI, SADA                          MRN:          [id number]  DATE:09/18/2007                            DOB:          19-May-1955    PRIMARY CARE PHYSICIAN:  None.   REASON FOR VISIT:  Post hospital follow-up.   HISTORY OF PRESENT ILLNESS:  Mr. Boehning comes to the office in follow-up  of a recent hospital stay back in late April to early May.  He presented  at that time with hypertensive urgency associated with congestive heart  failure.  He was ultimately diagnosed with a cardiomyopathy, left  ventricular ejection fraction of 25-30%, associated with essentially  nonobstructive coronary artery disease with the exception of a ramus  intermedius that was managed medically, renal insufficiency with a  creatinine of 1.4, and moderate to severe mitral regurgitation.  He was  placed on medical therapy which was titrated and comes into the clinic  today stating that he feels much better.  He has NYHA Class II dyspnea  on exertion at this point.  He is not having any orthopnea, PND or lower  extremity edema.  He denies any angina.  His electrocardiogram shows  sinus rhythm with nonspecific ST-T wave changes.  He reports compliance  with medications as well as salt restriction.  States that he has been  weighing himself and has lost approximately 4 pounds since being in the  hospital.  He also bought a home blood pressure monitor and has been  following this with blood pressures of 130/80 at home.  Today I spoke  with him about establishing with a primary care Lavoris Sparling as well as also  in our congestive heart failure clinic for long-term follow-up.  He will  need a follow-up echocardiogram ultimately to reassess his mitral  regurgitation on medical therapy.   MEDICATIONS:  1. Hydralazine 100 mg p.o. t.i.d.  2. Norvasc 5 mg p.o. daily.  3. Coreg 25 mg p.o. b.i.d.  4. Zocor 40 mg p.o.  nightly.  5. Enteric-coated aspirin 325 mg p.o. daily.  6. Digoxin 0.125 mg p.o. every other day.  7. Imdur 90 mg p.o. daily.  8. Potassium 20 mEq p.o. daily.  9. Zestril 5 mg p.o. daily.  10.Lasix 40 mg p.o. daily.  11.Sublingual nitroglycerin 0.4 mg p.r.n.   REVIEW OF SYSTEMS:  As described in history of present illness.  Otherwise negative.   PHYSICAL EXAMINATION:  VITAL SIGNS:  Blood pressure 141/89, heart rate  72, weight is 244 pounds.  GENERAL APPEARANCE:  An obese male no acute distress.  HEENT:  Conjunctivae and lids normal.  Oropharynx clear.  NECK:  Supple.  No elevated jugular venous pressure, no loud bruits.  LUNGS:  Clear without labored breathing.  CARDIAC:  Regular rate and rhythm.  Soft systolic murmur.  No S3 gallop.  ABDOMEN:  Soft, nontender.  Normal active bowel sounds.  EXTREMITIES:  No frank pitting edema.  Distal pulses are 1 to 2+.  SKIN:  Warm and dry.  MUSCULOSKELETAL:  No kyphosis noted.  NEUROPSYCHIATRIC:  The patient alert and oriented x3.  Affect is  appropriate.   IMPRESSION/RECOMMENDATIONS:  1. Nonischemic cardiomyopathy, potentially associated with      longstanding uncontrolled hypertension.  He is symptomatically      improved on medical therapy and we will plan to establish him with      Dr. Gala Romney in our congestive heart failure clinic for long-term      follow-up.  He will likely need continued medication adjustments      and will also need a follow-up echocardiogram to reassess his      mitral regurgitation.  My hope is that this will improve on medical      therapy, although he did have some restricted posterior leaflet      motion and this may be more of an anatomical issue.  I spoke with      him about the importance of sodium restriction and following his      weight carefully.  He will bring in a record of his blood pressure      and weight when he comes back in for his visit in about one month's      time.  Today we will obtain a  BMET and BNP for follow-up of his      renal function and fluid status.  Medication refills were provided.  2. Hypertension, better controlled following presentation with      hypertensive urgency.  3. Chronic renal insufficiency, baseline creatinine around 1.4.  4. We will also assist with arranging of primary care follow-up visit      through our group.     Jonelle Sidle, MD  Electronically Signed    SGM/MedQ  DD: 09/18/2007  DT: 09/18/2007  Job #: [id number]

## 2010-09-06 NOTE — Assessment & Plan Note (Signed)
[facility]                            CARDIOLOGY OFFICE NOTE   ZACARIAH, BELUE                          MRN:          [id number]  DATE:05/18/2008                            DOB:          09/21/1955    PRIMARY CARE PHYSICIAN:  [name], M.D.   INTERVAL HISTORY:  Trevor Anderson is a very pleasant 55 year old male with a  history of severe hypertension and nonischemic cardiomyopathy with  ejection fraction of 25-30% with severe mitral regurgitation.  Cardiac  catheterization showed nonobstructive coronary artery disease.  Fortunately, his LV function is recovered.  His EF has been normalized  to 60%, there is now only trivial mitral regurgitation.   He returns today for a routine followup.  He is doing great.  He has  been compliant with all his medications, and he says he feels wonderful.  He is walking at least half mile a day without any chest pain or  dyspnea.  He denies any orthopnea.  No PND.  No lower extremity edema.   CURRENT MEDICATIONS:  1. Imdur 60 a day.  2. Hydralazine 100 t.i.d.  3. Norvasc 5 a day.  4. Coreg 25 b.i.d.  5. Zocor 40 a day.  6. Potassium 20 a day.  7. Lasix 40 a day.  8. Metformin 500 a day.  9. Lisinopril 20 a day.  10.Aspirin 81 a day.   PHYSICAL EXAMINATION:  GENERAL:  He is well appearing.  He ambulates  around the clinic without any respiratory difficulty.  VITAL SIGNS:  Blood pressure is 112/76, heart rate 63, weight 222.  HEENT:  Normal.  NECK:  Supple.  No JVD.  Carotids are 2+ bilaterally without bruits.  There is no lymphadenopathy or thyromegaly.  CARDIAC:  PMI is nondisplaced.  Regular rate and rhythm.  No murmurs,  rubs, or gallops.  LUNGS:  Clear.  ABDOMEN:  Soft, nontender, nondistended.  No hepatosplenomegaly, no  bruits, no masses.  Good bowel sounds.  EXTREMITIES:  Warm with no  cyanosis, clubbing, or edema.  NEUROLOGIC:  Alert and oriented x3.  Cranial nerves II through XII are  intact.  Moves  all fours without difficulty.  Affect is pleasant.   EKG shows normal sinus rhythm at a rate of 63 with mild T-wave  flattening laterally.   ASSESSMENT/PLAN:  1. History of nonischemic cardiomyopathy due possibly to hypertensive      cardiomyopathy.  This is resolved.  He is doing great.  Volume      status looks good.  We will continue him on his current      medications.  2. Hypertension.  Blood pressure is well controlled.  3. Hyperlipidemia.  This is followed by Dr. Jonny Ruiz.  4. History of coronary artery disease.  This is nonobstructive.      Continue risk factor management.   Overall, he is doing great.  We will not make any changes today.  We  will see him back in 6 months for a repeat echocardiogram to make sure  his EF is stable.  I may consider stopping his Lasix in  the future or to  switching it to a p.r.n. basis.  Otherwise, we will continue his other  medications.     Bevelyn Buckles. Bensimhon, MD  Electronically Signed    DRB/MedQ  DD: 05/18/2008  DT: 05/19/2008  Job #: [id number]

## 2010-09-06 NOTE — Assessment & Plan Note (Signed)
[facility]                            CARDIOLOGY OFFICE NOTE   WIATT, MAHABIR                          MRN:          [id number]  DATE:10/22/2007                            DOB:          1955/10/08    INTERVAL HISTORY:  Trevor Anderson is a 55 year old male with a history of severe  hypertension.  He was admitted several months ago with hypertensive  urgency and was found to have primarily nonischemic cardiomyopathy with  an EF of 25-30% and severe mitral regurgitation.  He underwent cath  which showed primarily nonobstructive coronary artery disease with some  disease in the ramus intermedius.  He also has mild renal insufficiency.  He was previously cared for by Dr. Diona Browner, but he is now being  transferred to my care as Dr. Diona Browner goes to [place].   Mr. Caspers says since being in the hospital and getting started on his  medications he feels great.  He is walking every other day for at least  half an hour without any problems.  He has no chest pain, no shortness  of breath, no orthopnea, PND, or lower extremity edema.  He had a  followup echocardiogram earlier in the month which showed an recovered  ejection fraction with an EF of 60%.  There was only trivial mitral  regurgitation.   CURRENT MEDICATIONS:  1. Hydralazine 100 t.i.d.  2. Norvasc 5 a day.  3. Coreg 25 b.i.d.  4. Zocor 40 a day.  5. Aspirin 325 a day.  6. Digoxin 0.125 a day.  7. Imdur 90 a day.  8. Potassium 20 a day.  9. Zestril 5 a day.  10.Lasix 40 a day.   PHYSICAL EXAMINATION:  GENERAL:  He is well appearing, in no acute  distress, ambulates around the clinic without any respiratory  difficulty.  VITALS:  Blood pressure is 144/78, heart rate 71, weight is 238.  HEENT:  Normal.  NECK:  Supple.  No JVD.  Carotids are 2+ bilateral without bruits.  There is no lymphadenopathy or thyromegaly.  LUNGS:  Clear with no wheezes or rales.  CARDIAC:  PMI is mildly displaced.  He has  a regular rate and rhythm  with no murmurs.  There is a soft S4.  ABDOMEN:  Soft, nontender, nondistended.  No hepatosplenomegaly, no  bruits, no masses.  EXTREMITIES:  Warm with no cyanosis, clubbing or edema.  No rash.  NEURO:  Alert and oriented x3.  Cranial nerves II through XII are  intact.  Moves all four extremities without difficulty.  Affect is  pleasant.   ASSESSMENT:  1. History of congestive heart failure secondary to systolic      dysfunction likely due to hypertensive cardiomyopathy.  His      ejection fraction is recovered with treatment of his hypertension.      He is New York Heart Association class I.  We will continue to      titrate his ACE inhibitor and take his Zestril 20 mg a day.  2. Hypertension as above.  Blood pressure is still mildly elevated.  We are increasing his Zestril to 20 mg a day.  If needed we can      titrate up his Coreg or Norvasc or even add spironolactone.  There      are multiple options.  3. Coronary artery disease.  This is mild.  Continue current therapy.  4. Chronic renal insufficiency.  This has been stable.  Should it get      much worse, I think we will need to send him to Renal just for a      followup.      Bevelyn Buckles. Bensimhon, MD  Electronically Signed     Bevelyn Buckles. Bensimhon, MD  Electronically Signed   DRB/MedQ  DD: 10/22/2007  DT: 10/23/2007  Job #: [id number]

## 2010-11-13 ENCOUNTER — Encounter: Payer: Self-pay | Admitting: Internal Medicine

## 2010-11-13 DIAGNOSIS — Z0000 Encounter for general adult medical examination without abnormal findings: Secondary | ICD-10-CM | POA: Insufficient documentation

## 2010-11-14 ENCOUNTER — Other Ambulatory Visit: Payer: Self-pay

## 2010-11-14 ENCOUNTER — Other Ambulatory Visit: Payer: Self-pay | Admitting: Internal Medicine

## 2010-11-14 DIAGNOSIS — Z0000 Encounter for general adult medical examination without abnormal findings: Secondary | ICD-10-CM

## 2010-11-14 DIAGNOSIS — Z1289 Encounter for screening for malignant neoplasm of other sites: Secondary | ICD-10-CM

## 2010-11-15 ENCOUNTER — Encounter: Payer: Self-pay | Admitting: Internal Medicine

## 2010-11-18 ENCOUNTER — Other Ambulatory Visit (INDEPENDENT_AMBULATORY_CARE_PROVIDER_SITE_OTHER): Payer: Self-pay

## 2010-11-18 DIAGNOSIS — Z1289 Encounter for screening for malignant neoplasm of other sites: Secondary | ICD-10-CM

## 2010-11-18 DIAGNOSIS — IMO0001 Reserved for inherently not codable concepts without codable children: Secondary | ICD-10-CM

## 2010-11-18 DIAGNOSIS — Z0000 Encounter for general adult medical examination without abnormal findings: Secondary | ICD-10-CM

## 2010-11-18 LAB — CBC WITH DIFFERENTIAL/PLATELET
Basophils Absolute: 0 10*3/uL (ref 0.0–0.1)
Eosinophils Absolute: 0.1 10*3/uL (ref 0.0–0.7)
Lymphocytes Relative: 34.7 % (ref 12.0–46.0)
MCHC: 33.5 g/dL (ref 30.0–36.0)
Neutrophils Relative %: 56.3 % (ref 43.0–77.0)
RDW: 13.3 % (ref 11.5–14.6)

## 2010-11-18 LAB — LIPID PANEL
Cholesterol: 111 mg/dL (ref 0–200)
HDL: 32.6 mg/dL — ABNORMAL LOW (ref >39.00)
LDL Cholesterol: 65 mg/dL (ref 0–99)
Total CHOL/HDL Ratio: 3
Triglycerides: 69 mg/dL (ref 0.0–149.0)
VLDL: 13.8 mg/dL (ref 0.0–40.0)

## 2010-11-18 LAB — URINALYSIS
Bilirubin Urine: NEGATIVE
Hgb urine dipstick: NEGATIVE
Ketones, ur: NEGATIVE
Leukocytes, UA: NEGATIVE
Nitrite: NEGATIVE
Specific Gravity, Urine: 1.025 (ref 1.000–1.030)
Total Protein, Urine: NEGATIVE
Urine Glucose: NEGATIVE
Urobilinogen, UA: 0.2 (ref 0.0–1.0)
pH: 5.5 (ref 5.0–8.0)

## 2010-11-18 LAB — BASIC METABOLIC PANEL
BUN: 18 mg/dL (ref 6–23)
CO2: 28 mEq/L (ref 19–32)
Calcium: 8.7 mg/dL (ref 8.4–10.5)
Chloride: 109 mEq/L (ref 96–112)
Creatinine, Ser: 1.2 mg/dL (ref 0.4–1.5)
GFR: 83.04 mL/min (ref >60.00)
Glucose, Bld: 109 mg/dL — ABNORMAL HIGH (ref 70–99)
Potassium: 4.1 mEq/L (ref 3.5–5.1)
Sodium: 145 mEq/L (ref 135–145)

## 2010-11-18 LAB — TSH: TSH: 0.7 u[IU]/mL (ref 0.35–5.50)

## 2010-11-18 LAB — HEPATIC FUNCTION PANEL
ALT: 10 U/L (ref 0–53)
AST: 18 U/L (ref 0–37)
Albumin: 4.2 g/dL (ref 3.5–5.2)
Alkaline Phosphatase: 61 U/L (ref 39–117)
Bilirubin, Direct: 0.1 mg/dL (ref 0.0–0.3)
Total Bilirubin: 0.6 mg/dL (ref 0.3–1.2)
Total Protein: 7.6 g/dL (ref 6.0–8.3)

## 2010-11-18 LAB — MICROALBUMIN / CREATININE URINE RATIO: Microalb, Ur: 0.8 mg/dL (ref 0.0–1.9)

## 2010-11-18 LAB — HEMOGLOBIN A1C: Hgb A1c MFr Bld: 6.9 % — ABNORMAL HIGH (ref 4.6–6.5)

## 2010-11-21 ENCOUNTER — Ambulatory Visit (INDEPENDENT_AMBULATORY_CARE_PROVIDER_SITE_OTHER): Payer: 59 | Admitting: Internal Medicine

## 2010-11-21 ENCOUNTER — Encounter: Payer: Self-pay | Admitting: Internal Medicine

## 2010-11-21 VITALS — BP 92/64 | HR 66 | Temp 99.3°F | Ht 71.0 in | Wt 226.2 lb

## 2010-11-21 DIAGNOSIS — Z0000 Encounter for general adult medical examination without abnormal findings: Secondary | ICD-10-CM

## 2010-11-21 DIAGNOSIS — E119 Type 2 diabetes mellitus without complications: Secondary | ICD-10-CM

## 2010-11-21 MED ORDER — FUROSEMIDE 40 MG PO TABS: 40.0000 mg | ORAL_TABLET | Freq: Every day | ORAL | Status: DC

## 2010-11-21 MED ORDER — METFORMIN HCL 500 MG PO TABS: 500.0000 mg | ORAL_TABLET | Freq: Every day | ORAL | Status: DC

## 2010-11-21 MED ORDER — CARVEDILOL 25 MG PO TABS: 25.0000 mg | ORAL_TABLET | Freq: Two times a day (BID) | ORAL | Status: DC

## 2010-11-21 MED ORDER — SIMVASTATIN 40 MG PO TABS: 40.0000 mg | ORAL_TABLET | Freq: Every day | ORAL | Status: DC

## 2010-11-21 MED ORDER — LISINOPRIL 20 MG PO TABS: 20.0000 mg | ORAL_TABLET | Freq: Every day | ORAL | Status: DC

## 2010-11-21 MED ORDER — POTASSIUM CHLORIDE CRYS ER 20 MEQ PO TBCR: 20.0000 meq | EXTENDED_RELEASE_TABLET | Freq: Every day | ORAL | Status: DC

## 2010-11-21 MED ORDER — AMLODIPINE BESYLATE 5 MG PO TABS: 5.0000 mg | ORAL_TABLET | Freq: Every day | ORAL | Status: DC

## 2010-11-21 MED ORDER — ISOSORBIDE MONONITRATE ER 60 MG PO TB24: 60.0000 mg | ORAL_TABLET | Freq: Every day | ORAL | Status: DC

## 2010-11-21 MED ORDER — HYDRALAZINE HCL 25 MG PO TABS: 25.0000 mg | ORAL_TABLET | Freq: Three times a day (TID) | ORAL | Status: DC

## 2010-11-21 NOTE — Progress Notes (Signed)
Subjective:    Patient ID: Trevor Anderson, male    DOB: 12/28/1955, 55 y.o.   MRN: [id number]  HPI  Here for wellness and f/u;  Overall doing ok;  Pt denies CP, worsening SOB, DOE, wheezing, orthopnea, PND, worsening LE edema, palpitations, dizziness or syncope.  Pt denies neurological change such as new Headache, facial or extremity weakness.  Pt denies polydipsia, polyuria, or low sugar symptoms. Pt states overall good compliance with treatment and medications, good tolerability, and trying to follow lower cholesterol diet.  Pt denies worsening depressive symptoms, suicidal ideation or panic. No fever, wt loss, night sweats, loss of appetite, or other constitutional symptoms.  Pt states good ability with ADL's, low fall risk, home safety reviewed and adequate, no significant changes in hearing or vision, and occasionally active with exercise. Past Medical History  Diagnosis Date  . 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 CONGESTIVE HEART FAILURE 11/12/2007  . DIABETES MELLITUS, TYPE II 11/20/2008  . HYPERLIPIDEMIA 11/12/2007  . HYPERTENSION 11/12/2007  . TACHYCARDIA 01/24/2010   Past Surgical History  Procedure Date  . Tonsillectomy 1970    reports that he has never smoked. He does not have any smokeless tobacco history on file. He reports that he drinks alcohol. He reports that he does not use illicit drugs. family history includes Kidney disease in his mother. No Known Allergies Current Outpatient Prescriptions on File Prior to Visit  Medication Sig Dispense Refill  . amLODipine (NORVASC) 5 MG tablet Take 5 mg by mouth daily.        Marland Kitchen aspirin 81 MG tablet Take 81 mg by mouth daily.        . carvedilol (COREG) 25 MG tablet Take 25 mg by mouth 2 (two) times daily with a meal.        . furosemide (LASIX) 40 MG tablet Take 40 mg by mouth daily.        . hydrALAZINE (APRESOLINE) 25 MG tablet Take 25 mg by mouth 3 (three) times daily.        . isosorbide mononitrate (IMDUR) 60 MG 24 hr tablet Take  60 mg by mouth daily.        Marland Kitchen lisinopril (PRINIVIL,ZESTRIL) 20 MG tablet Take 20 mg by mouth daily.        . metFORMIN (GLUCOPHAGE) 500 MG tablet Take 500 mg by mouth daily with breakfast.        . potassium chloride SA (K-DUR,KLOR-CON) 20 MEQ tablet Take 20 mEq by mouth daily.        . simvastatin (ZOCOR) 40 MG tablet Take 40 mg by mouth at bedtime.         Review of Systems Review of Systems  Constitutional: Negative for diaphoresis, activity change, appetite change and unexpected weight change.  HENT: Negative for hearing loss, ear pain, facial swelling, mouth sores and neck stiffness.   Eyes: Negative for pain, redness and visual disturbance.  Respiratory: Negative for shortness of breath and wheezing.   Cardiovascular: Negative for chest pain and palpitations.  Gastrointestinal: Negative for diarrhea, blood in stool, abdominal distention and rectal pain.  Genitourinary: Negative for hematuria, flank pain and decreased urine volume.  Musculoskeletal: Negative for myalgias and joint swelling.  Skin: Negative for color change and wound.  Neurological: Negative for syncope and numbness.  Hematological: Negative for adenopathy.  Psychiatric/Behavioral: Negative for hallucinations, self-injury, decreased concentration and agitation.       Objective:   Physical Exam BP 92/64  Pulse 66  Temp(Src) 99.3 F (37.4  C) (Oral)  Ht 5\' 11"  (1.803 m)  Wt 226 lb 4 oz (102.626 kg)  BMI 31.56 kg/m2  SpO2 97% .Physical Exam  VS noted Constitutional: Pt is oriented to person, place, and time. Appears well-developed and well-nourished.  HENT:  Head: Normocephalic and atraumatic.  Right Ear: External ear normal.  Left Ear: External ear normal.  Nose: Nose normal.  Mouth/Throat: Oropharynx is clear and moist.  Eyes: Conjunctivae and EOM are normal. Pupils are equal, round, and reactive to light.  Neck: Normal range of motion. Neck supple. No JVD present. No tracheal deviation present.    Cardiovascular: Normal rate, regular rhythm, normal heart sounds and intact distal pulses.   Pulmonary/Chest: Effort normal and breath sounds normal.  Abdominal: Soft. Bowel sounds are normal. There is no tenderness.  Musculoskeletal: Normal range of motion. Exhibits no edema.  Lymphadenopathy:  Has no cervical adenopathy.  Neurological: Pt is alert and oriented to person, place, and time. Pt has normal reflexes. No cranial nerve deficit.  Skin: Skin is warm and dry. No rash noted.  Psychiatric:  Has  normal mood and affect. Behavior is normal.     Assessment & Plan:

## 2010-11-21 NOTE — Assessment & Plan Note (Signed)

## 2010-11-21 NOTE — Patient Instructions (Signed)
Continue all other medications as before All of your refills have been done Please call if you change your mind and would like [place] to schedule the colonoscopy Please return in 6 mo with Lab testing done 3-5 days before

## 2011-01-17 LAB — CBC
HCT: 40
Hemoglobin: 13.3
MCV: 91.9
Platelets: 294
RDW: 14.8

## 2011-01-17 LAB — DIFFERENTIAL
Basophils Absolute: 0
Eosinophils Absolute: 0
Eosinophils Relative: 0
Lymphs Abs: 1.6
Monocytes Absolute: 0.4

## 2011-01-17 LAB — CARDIAC PANEL(CRET KIN+CKTOT+MB+TROPI)
CK, MB: 2.8
Relative Index: 0.7
Relative Index: 0.7

## 2011-01-17 LAB — RENAL FUNCTION PANEL
Albumin: 3.1 — ABNORMAL LOW
BUN: 31 — ABNORMAL HIGH
Phosphorus: 4.7 — ABNORMAL HIGH
Potassium: 3.5
Sodium: 139

## 2011-01-17 LAB — RAPID URINE DRUG SCREEN, HOSP PERFORMED
Amphetamines: NOT DETECTED
Cocaine: NOT DETECTED
Opiates: NOT DETECTED
Tetrahydrocannabinol: NOT DETECTED

## 2011-01-17 LAB — URINALYSIS, ROUTINE W REFLEX MICROSCOPIC
Bilirubin Urine: NEGATIVE
Hgb urine dipstick: NEGATIVE
Protein, ur: 100 — AB
Urobilinogen, UA: 1

## 2011-01-17 LAB — COMPREHENSIVE METABOLIC PANEL
AST: 71 — ABNORMAL HIGH
Albumin: 3.3 — ABNORMAL LOW
Calcium: 8.5
Chloride: 105
Creatinine, Ser: 1.84 — ABNORMAL HIGH
GFR calc Af Amer: 48 — ABNORMAL LOW
Total Bilirubin: 1.1

## 2011-01-17 LAB — PROTEIN ELECTROPH W RFLX QUANT IMMUNOGLOBULINS
Alpha-2-Globulin: 11.3
M-Spike, %: NOT DETECTED
Total Protein ELP: 6.7

## 2011-01-17 LAB — APTT: aPTT: 25

## 2011-01-17 LAB — LIPID PANEL
LDL Cholesterol: 121 — ABNORMAL HIGH
VLDL: 15

## 2011-01-17 LAB — HEMOGLOBIN A1C: Hgb A1c MFr Bld: 6.5 — ABNORMAL HIGH

## 2011-01-17 LAB — PTH, INTACT AND CALCIUM: Calcium, Total (PTH): 8.8

## 2011-01-17 LAB — PROTIME-INR: INR: 1.2

## 2011-01-17 LAB — TSH: TSH: 2.544

## 2011-01-17 LAB — CK TOTAL AND CKMB (NOT AT ARMC): Relative Index: 0.7

## 2011-01-17 LAB — B-NATRIURETIC PEPTIDE (CONVERTED LAB): Pro B Natriuretic peptide (BNP): 815 — ABNORMAL HIGH

## 2011-05-29 ENCOUNTER — Encounter: Payer: Self-pay | Admitting: Internal Medicine

## 2011-05-29 ENCOUNTER — Ambulatory Visit (INDEPENDENT_AMBULATORY_CARE_PROVIDER_SITE_OTHER): Payer: 59 | Admitting: Internal Medicine

## 2011-05-29 ENCOUNTER — Other Ambulatory Visit (INDEPENDENT_AMBULATORY_CARE_PROVIDER_SITE_OTHER): Payer: 59

## 2011-05-29 VITALS — BP 100/72 | HR 65 | Temp 99.2°F | Ht 71.0 in | Wt 235.0 lb

## 2011-05-29 DIAGNOSIS — I10 Essential (primary) hypertension: Secondary | ICD-10-CM

## 2011-05-29 DIAGNOSIS — I509 Heart failure, unspecified: Secondary | ICD-10-CM

## 2011-05-29 DIAGNOSIS — Z0000 Encounter for general adult medical examination without abnormal findings: Secondary | ICD-10-CM

## 2011-05-29 DIAGNOSIS — E119 Type 2 diabetes mellitus without complications: Secondary | ICD-10-CM

## 2011-05-29 DIAGNOSIS — E785 Hyperlipidemia, unspecified: Secondary | ICD-10-CM

## 2011-05-29 LAB — LIPID PANEL
Cholesterol: 133 mg/dL (ref 0–200)
HDL: 40.2 mg/dL (ref >39.00)
Total CHOL/HDL Ratio: 3
Triglycerides: 88 mg/dL (ref 0.0–149.0)

## 2011-05-29 LAB — BASIC METABOLIC PANEL
Chloride: 107 mEq/L (ref 96–112)
Potassium: 4.3 mEq/L (ref 3.5–5.1)
Sodium: 142 mEq/L (ref 135–145)

## 2011-05-29 MED ORDER — GLUCOSE BLOOD VI STRP: ORAL_STRIP | Status: AC

## 2011-05-29 MED ORDER — LANCETS MISC: 1.0000 "application " | Freq: Every day | Status: AC

## 2011-05-29 NOTE — Patient Instructions (Addendum)
Please continue all other medications as before, for pending your blood work today Please check your sugar at home at least 3 times per wk, either in the AM before breakfast or before lunch, or before dinner to get an idea of how your sugars do during the day as well as first thing in the morning Please go to LAB in the Basement for the blood and/or urine tests to be done today Please call the phone number [phone] (the [employer] System) for results of testing in 2-3 days;  When calling, simply dial the number, and when prompted enter the MRN number above (the Medical Record Number) and the # key, then the message should start. You will be contacted regarding the referral for: Diabetes Education Please return in 6 mo with Lab testing done 3-5 days before

## 2011-06-04 ENCOUNTER — Other Ambulatory Visit: Payer: Self-pay | Admitting: Internal Medicine

## 2011-06-04 ENCOUNTER — Encounter: Payer: Self-pay | Admitting: Internal Medicine

## 2011-06-04 NOTE — Assessment & Plan Note (Signed)
stable overall by hx and exam, most recent data reviewed with pt, and pt to continue medical treatment as before Lab Results  Component Value Date   LDLCALC 75 05/29/2011    

## 2011-06-04 NOTE — Assessment & Plan Note (Signed)
stable overall by hx and exam, most recent data reviewed with pt, and pt to continue medical treatment as before  Lab Results  Component Value Date   HGBA1C 6.5 05/29/2011    

## 2011-06-04 NOTE — Progress Notes (Signed)
Subjective:    Patient ID: Trevor Anderson, male    DOB: April 07, 1956, 56 y.o.   MRN: [id number]  HPI  Here to f/u; overall doing ok,  Pt denies chest pain, increased sob or doe, wheezing, orthopnea, PND, increased LE swelling, palpitations, dizziness or syncope.  Pt denies new neurological symptoms such as new headache, or facial or extremity weakness or numbness   Pt denies polydipsia, polyuria, or low sugar symptoms such as weakness or confusion improved with po intake.  Pt states overall good compliance with meds, trying to follow lower cholesterol, diabetic diet, wt overall increaed from 226 to 235, but little exercise however.   Pt denies fever, wt loss, night sweats, loss of appetite, or other constitutional symptom Overall good compliance with treatment, and good medicine tolerability. Past Medical History  Diagnosis Date  . 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 CONGESTIVE HEART FAILURE 11/12/2007  . DIABETES MELLITUS, TYPE II 11/20/2008  . HYPERLIPIDEMIA 11/12/2007  . HYPERTENSION 11/12/2007  . TACHYCARDIA 01/24/2010   Past Surgical History  Procedure Date  . Tonsillectomy 1970    reports that he has never smoked. He does not have any smokeless tobacco history on file. He reports that he drinks alcohol. He reports that he does not use illicit drugs. family history includes Kidney disease in his mother. No Known Allergies Current Outpatient Prescriptions on File Prior to Visit  Medication Sig Dispense Refill  . amLODipine (NORVASC) 5 MG tablet Take 1 tablet (5 mg total) by mouth daily.  90 tablet  3  . aspirin 81 MG tablet Take 81 mg by mouth daily.        . carvedilol (COREG) 25 MG tablet Take 1 tablet (25 mg total) by mouth 2 (two) times daily with a meal.  180 tablet  3  . furosemide (LASIX) 40 MG tablet Take 1 tablet (40 mg total) by mouth daily.  90 tablet  3  . hydrALAZINE (APRESOLINE) 25 MG tablet Take 1 tablet (25 mg total) by mouth 3 (three) times daily.  270 tablet  3  .  isosorbide mononitrate (IMDUR) 60 MG 24 hr tablet Take 1 tablet (60 mg total) by mouth daily.  90 tablet  3  . lisinopril (PRINIVIL,ZESTRIL) 20 MG tablet Take 1 tablet (20 mg total) by mouth daily.  90 tablet  3  . metFORMIN (GLUCOPHAGE) 500 MG tablet Take 1 tablet (500 mg total) by mouth daily with breakfast.  90 tablet  3  . potassium chloride SA (K-DUR,KLOR-CON) 20 MEQ tablet Take 1 tablet (20 mEq total) by mouth daily.  90 tablet  3  . simvastatin (ZOCOR) 40 MG tablet Take 1 tablet (40 mg total) by mouth at bedtime.  90 tablet  3   Review of Systems Review of Systems  Constitutional: Negative for diaphoresis and unexpected weight change.  HENT: Negative for drooling and tinnitus.   Eyes: Negative for photophobia and visual disturbance.  Respiratory: Negative for choking and stridor.   Gastrointestinal: Negative for vomiting and blood in stool.  Genitourinary: Negative for hematuria and decreased urine volume.  Musculoskeletal: Negative for gait problem.  Skin: Negative for color change and wound.  Neurological: Negative for tremors and numbness.  Psychiatric/Behavioral: Negative for decreased concentration. The patient is not hyperactive.       Objective:   Physical Exam BP 100/72  Pulse 65  Temp(Src) 99.2 F (37.3 C) (Oral)  Ht 5\' 11"  (1.803 m)  Wt 235 lb (106.595 kg)  BMI 32.78 kg/m2  SpO2 99%  Physical Exam  VS noted Constitutional: Pt appears well-developed and well-nourished.  HENT: Head: Normocephalic.  Right Ear: External ear normal.  Left Ear: External ear normal.  Eyes: Conjunctivae and EOM are normal. Pupils are equal, round, and reactive to light.  Neck: Normal range of motion. Neck supple.  Cardiovascular: Normal rate and regular rhythm.   Pulmonary/Chest: Effort normal and breath sounds normal.  Abd:  Soft, NT, non-distended, + BS Neurological: Pt is alert. No cranial nerve deficit.  Skin: Skin is warm. No erythema.  Psychiatric: Pt behavior is normal.  Thought content normal.     Assessment & Plan:

## 2011-06-04 NOTE — Assessment & Plan Note (Signed)
stable overall by hx and exam, most recent data reviewed with pt, and pt to continue medical treatment as before  BP Readings from Last 3 Encounters:  05/29/11 100/72  11/21/10 92/64  05/23/10 100/64

## 2011-06-04 NOTE — Assessment & Plan Note (Signed)
stable overall by hx and exam, most recent data reviewed with pt, and pt to continue medical treatment as before  Lab Results  Component Value Date   WBC 5.0 11/18/2010   HGB 13.4 11/18/2010   HCT 40.0 11/18/2010   PLT 212.0 11/18/2010   GLUCOSE 107* 05/29/2011   CHOL 133 05/29/2011   TRIG 88.0 05/29/2011   HDL 40.20 05/29/2011   LDLCALC 75 05/29/2011   ALT 10 11/18/2010   AST 18 11/18/2010   NA 142 05/29/2011   K 4.3 05/29/2011   CL 107 05/29/2011   CREATININE 1.3 05/29/2011   BUN 14 05/29/2011   CO2 30 05/29/2011   TSH 0.70 11/18/2010   PSA 1.27 11/18/2010   INR 1.2 08/21/2007   HGBA1C 6.5 05/29/2011   MICROALBUR 0.8 11/18/2010

## 2011-09-04 ENCOUNTER — Other Ambulatory Visit: Payer: Self-pay

## 2011-09-04 MED ORDER — ISOSORBIDE MONONITRATE ER 60 MG PO TB24: 60.0000 mg | ORAL_TABLET | Freq: Every day | ORAL | Status: DC

## 2011-11-17 ENCOUNTER — Other Ambulatory Visit: Payer: Self-pay | Admitting: Internal Medicine

## 2011-11-26 ENCOUNTER — Other Ambulatory Visit: Payer: Self-pay | Admitting: Internal Medicine

## 2011-11-27 ENCOUNTER — Other Ambulatory Visit (INDEPENDENT_AMBULATORY_CARE_PROVIDER_SITE_OTHER): Payer: 59

## 2011-11-27 ENCOUNTER — Encounter: Payer: Self-pay | Admitting: Internal Medicine

## 2011-11-27 ENCOUNTER — Ambulatory Visit (INDEPENDENT_AMBULATORY_CARE_PROVIDER_SITE_OTHER): Payer: 59 | Admitting: Internal Medicine

## 2011-11-27 VITALS — BP 108/70 | HR 67 | Temp 98.7°F | Ht 71.0 in | Wt 231.1 lb

## 2011-11-27 DIAGNOSIS — E119 Type 2 diabetes mellitus without complications: Secondary | ICD-10-CM

## 2011-11-27 DIAGNOSIS — Z0000 Encounter for general adult medical examination without abnormal findings: Secondary | ICD-10-CM

## 2011-11-27 LAB — BASIC METABOLIC PANEL
CO2: 28 mEq/L (ref 19–32)
Calcium: 9.1 mg/dL (ref 8.4–10.5)
GFR: 78.85 mL/min (ref >60.00)
Sodium: 142 mEq/L (ref 135–145)

## 2011-11-27 LAB — URINALYSIS, ROUTINE W REFLEX MICROSCOPIC
Hgb urine dipstick: NEGATIVE
Ketones, ur: NEGATIVE
Total Protein, Urine: NEGATIVE
Urine Glucose: NEGATIVE
Urobilinogen, UA: 0.2 (ref 0.0–1.0)

## 2011-11-27 LAB — CBC WITH DIFFERENTIAL/PLATELET
Basophils Absolute: 0 10*3/uL (ref 0.0–0.1)
Eosinophils Absolute: 0.1 10*3/uL (ref 0.0–0.7)
HCT: 41.5 % (ref 39.0–52.0)
Hemoglobin: 13.8 g/dL (ref 13.0–17.0)
Lymphs Abs: 1.6 10*3/uL (ref 0.7–4.0)
MCHC: 33.2 g/dL (ref 30.0–36.0)
MCV: 95.7 fl (ref 78.0–100.0)
Monocytes Absolute: 0.3 10*3/uL (ref 0.1–1.0)
Neutro Abs: 3.6 10*3/uL (ref 1.4–7.7)
RDW: 13.5 % (ref 11.5–14.6)

## 2011-11-27 LAB — LIPID PANEL
HDL: 36.3 mg/dL — ABNORMAL LOW (ref >39.00)
Total CHOL/HDL Ratio: 3

## 2011-11-27 LAB — MICROALBUMIN / CREATININE URINE RATIO
Creatinine,U: 252.4 mg/dL
Microalb Creat Ratio: 0.4 mg/g (ref 0.0–30.0)
Microalb, Ur: 1 mg/dL (ref 0.0–1.9)

## 2011-11-27 LAB — HEPATIC FUNCTION PANEL
Alkaline Phosphatase: 57 U/L (ref 39–117)
Bilirubin, Direct: 0.1 mg/dL (ref 0.0–0.3)
Total Bilirubin: 0.7 mg/dL (ref 0.3–1.2)
Total Protein: 7.2 g/dL (ref 6.0–8.3)

## 2011-11-27 MED ORDER — CARVEDILOL 25 MG PO TABS: 25.0000 mg | ORAL_TABLET | Freq: Two times a day (BID) | ORAL | Status: DC

## 2011-11-27 MED ORDER — FUROSEMIDE 40 MG PO TABS: 40.0000 mg | ORAL_TABLET | Freq: Every day | ORAL | Status: DC

## 2011-11-27 MED ORDER — AMLODIPINE BESYLATE 5 MG PO TABS: 5.0000 mg | ORAL_TABLET | Freq: Every day | ORAL | Status: DC

## 2011-11-27 MED ORDER — POTASSIUM CHLORIDE CRYS ER 20 MEQ PO TBCR: 20.0000 meq | EXTENDED_RELEASE_TABLET | Freq: Every day | ORAL | Status: DC

## 2011-11-27 MED ORDER — SIMVASTATIN 40 MG PO TABS: 40.0000 mg | ORAL_TABLET | Freq: Every day | ORAL | Status: DC

## 2011-11-27 MED ORDER — LISINOPRIL 20 MG PO TABS: 20.0000 mg | ORAL_TABLET | Freq: Every day | ORAL | Status: DC

## 2011-11-27 MED ORDER — ISOSORBIDE MONONITRATE ER 60 MG PO TB24: 60.0000 mg | ORAL_TABLET | Freq: Every day | ORAL | Status: DC

## 2011-11-27 MED ORDER — METFORMIN HCL 500 MG PO TABS: 500.0000 mg | ORAL_TABLET | Freq: Every day | ORAL | Status: DC

## 2011-11-27 NOTE — Assessment & Plan Note (Addendum)
Overall doing well, age appropriate education and counseling updated, referrals for preventative services and immunizations addressed, dietary and smoking counseling addressed, most recent labs and ECG reviewed.  I have personally reviewed and have noted: 1) the patient's medical and social history 2) The pt's use of alcohol, tobacco, and illicit drugs 3) The patient's current medications and supplements 4) Functional ability including ADL's, fall risk, home safety risk, hearing and visual impairment 5) Diet and physical activities 6) Evidence for depression or mood disorder 7) The patient's height, weight, and BMI have been recorded in the chart I have made referrals, and provided counseling and education based on review of the above ECG reviewed as per emr Declines colonoscopy

## 2011-11-27 NOTE — Progress Notes (Signed)
Subjective:    Patient ID: Trevor Anderson, male    DOB: 02-Sep-1955, 56 y.o.   MRN: [id number]  HPI  Here for wellness and f/u;  Overall doing ok;  Pt denies CP, worsening SOB, DOE, wheezing, orthopnea, PND, worsening LE edema, palpitations, dizziness or syncope.  Pt denies neurological change such as new Headache, facial or extremity weakness.  Pt denies polydipsia, polyuria, or low sugar symptoms. Pt states overall good compliance with treatment and medications, good tolerability, and trying to follow lower cholesterol diet.  Pt denies worsening depressive symptoms, suicidal ideation or panic. No fever, wt loss, night sweats, loss of appetite, or other constitutional symptoms.  Pt states good ability with ADL's, low fall risk, home safety reviewed and adequate, no significant changes in hearing or vision, and occasionally active with exercise.  No acute complaints Past Medical History  Diagnosis Date  . 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 CONGESTIVE HEART FAILURE 11/12/2007  . DIABETES MELLITUS, TYPE II 11/20/2008  . HYPERLIPIDEMIA 11/12/2007  . HYPERTENSION 11/12/2007  . TACHYCARDIA 01/24/2010   Past Surgical History  Procedure Date  . Tonsillectomy 1970    reports that he has never smoked. He does not have any smokeless tobacco history on file. He reports that he drinks alcohol. He reports that he does not use illicit drugs. family history includes Kidney disease in his mother. No Known Allergies Current Outpatient Prescriptions on File Prior to Visit  Medication Sig Dispense Refill  . aspirin 81 MG tablet Take 81 mg by mouth daily.        Marland Kitchen glucose blood (ONE TOUCH ULTRA TEST) test strip Use as instructed  100 each  12  . hydrALAZINE (APRESOLINE) 25 MG tablet Take 1 tablet (25 mg total) by mouth 3 (three) times daily.  270 tablet  3  . Lancets MISC 1 application by Does not apply route daily.  100 each  11  . DISCONTD: amLODipine (NORVASC) 5 MG tablet Take 1 tablet (5 mg total) by mouth  daily.  90 tablet  3  . DISCONTD: carvedilol (COREG) 25 MG tablet TAKE ONE TABLET BY MOUTH TWICE DAILY  180 tablet  3  . DISCONTD: furosemide (LASIX) 40 MG tablet TAKE ONE TABLET BY MOUTH EVERY DAY  90 tablet  3  . DISCONTD: isosorbide mononitrate (IMDUR) 60 MG 24 hr tablet Take 1 tablet (60 mg total) by mouth daily.  90 tablet  2  . DISCONTD: lisinopril (PRINIVIL,ZESTRIL) 20 MG tablet TAKE ONE TABLET BY MOUTH EVERY DAY  90 tablet  2  . DISCONTD: metFORMIN (GLUCOPHAGE) 500 MG tablet TAKE ONE TABLET BY MOUTH EVERY DAY WITH BREAKFAST  90 tablet  1  . DISCONTD: potassium chloride SA (K-DUR,KLOR-CON) 20 MEQ tablet Take 1 tablet (20 mEq total) by mouth daily.  90 tablet  3  . DISCONTD: simvastatin (ZOCOR) 40 MG tablet Take 1 tablet (40 mg total) by mouth at bedtime.  90 tablet  3   Review of Systems Review of Systems  Constitutional: Negative for diaphoresis, activity change, appetite change and unexpected weight change.  HENT: Negative for hearing loss, ear pain, facial swelling, mouth sores and neck stiffness.   Eyes: Negative for pain, redness and visual disturbance.  Respiratory: Negative for shortness of breath and wheezing.   Cardiovascular: Negative for chest pain and palpitations.  Gastrointestinal: Negative for diarrhea, blood in stool, abdominal distention and rectal pain.  Genitourinary: Negative for hematuria, flank pain and decreased urine volume.  Musculoskeletal: Negative for myalgias and joint swelling.  Skin: Negative for color change and wound.  Neurological: Negative for syncope and numbness.  Hematological: Negative for adenopathy.  Psychiatric/Behavioral: Negative for hallucinations, self-injury, decreased concentration and agitation.      Objective:   Physical Exam BP 108/70  Pulse 67  Temp 98.7 F (37.1 C) (Oral)  Ht 5\' 11"  (1.803 m)  Wt 231 lb 2 oz (104.838 kg)  BMI 32.24 kg/m2  SpO2 95% Physical Exam  VS noted Constitutional: Pt is oriented to person, place,  and time. Appears well-developed and well-nourished. Lavella Lemons HENT:  Head: Normocephalic and atraumatic.  Right Ear: External ear normal.  Left Ear: External ear normal.  Nose: Nose normal.  Mouth/Throat: Oropharynx is clear and moist.  Eyes: Conjunctivae and EOM are normal. Pupils are equal, round, and reactive to light.  Neck: Normal range of motion. Neck supple. No JVD present. No tracheal deviation present.  Cardiovascular: Normal rate, regular rhythm, normal heart sounds and intact distal pulses.   Pulmonary/Chest: Effort normal and breath sounds normal.  Abdominal: Soft. Bowel sounds are normal. There is no tenderness.  Musculoskeletal: Normal range of motion. Exhibits no edema.  Lymphadenopathy:  Has no cervical adenopathy.  Neurological: Pt is alert and oriented to person, place, and time. Pt has normal reflexes. No cranial nerve deficit.  Skin: Skin is warm and dry. No rash noted.  Psychiatric:  Has  normal mood and affect except mild nervous, Behavior is normal.     Assessment & Plan:

## 2011-11-27 NOTE — Patient Instructions (Addendum)
Continue all other medications as before Please go to LAB in the Basement for the blood and/or urine tests to be done today You will be contacted by phone if any changes need to be made immediately.  Otherwise, you will receive a letter about your results with an explanation. Please call if you change your mind about having the screening colonoscopy Please return in 6 mo with Lab testing done 3-5 days before

## 2011-11-27 NOTE — Assessment & Plan Note (Signed)
stable overall by hx and exam, most recent data reviewed with pt, and pt to continue medical treatment as before  Lab Results  Component Value Date   HGBA1C 6.5 05/29/2011

## 2011-12-08 ENCOUNTER — Other Ambulatory Visit: Payer: Self-pay | Admitting: Internal Medicine

## 2012-05-31 ENCOUNTER — Other Ambulatory Visit (INDEPENDENT_AMBULATORY_CARE_PROVIDER_SITE_OTHER): Payer: 59

## 2012-05-31 DIAGNOSIS — E119 Type 2 diabetes mellitus without complications: Secondary | ICD-10-CM

## 2012-05-31 LAB — BASIC METABOLIC PANEL
BUN: 17 mg/dL (ref 6–23)
Calcium: 8.9 mg/dL (ref 8.4–10.5)
Creatinine, Ser: 1.4 mg/dL (ref 0.4–1.5)
GFR: 67.78 mL/min (ref >60.00)

## 2012-05-31 LAB — LIPID PANEL
HDL: 29.3 mg/dL — ABNORMAL LOW (ref >39.00)
LDL Cholesterol: 53 mg/dL (ref 0–99)
Total CHOL/HDL Ratio: 4
Triglycerides: 125 mg/dL (ref 0.0–149.0)

## 2012-06-03 ENCOUNTER — Ambulatory Visit (INDEPENDENT_AMBULATORY_CARE_PROVIDER_SITE_OTHER): Payer: BC Managed Care – PPO | Admitting: Internal Medicine

## 2012-06-03 ENCOUNTER — Encounter: Payer: Self-pay | Admitting: Internal Medicine

## 2012-06-03 VITALS — BP 102/68 | HR 80 | Temp 100.0°F | Ht 71.0 in | Wt 238.0 lb

## 2012-06-03 DIAGNOSIS — I10 Essential (primary) hypertension: Secondary | ICD-10-CM

## 2012-06-03 DIAGNOSIS — E785 Hyperlipidemia, unspecified: Secondary | ICD-10-CM

## 2012-06-03 DIAGNOSIS — E119 Type 2 diabetes mellitus without complications: Secondary | ICD-10-CM

## 2012-06-03 DIAGNOSIS — Z0000 Encounter for general adult medical examination without abnormal findings: Secondary | ICD-10-CM

## 2012-06-03 DIAGNOSIS — Z23 Encounter for immunization: Secondary | ICD-10-CM

## 2012-06-03 NOTE — Assessment & Plan Note (Signed)
stable overall by history and exam, recent data reviewed with pt, and pt to continue medical treatment as before,  to f/u any worsening symptoms or concerns BP Readings from Last 3 Encounters:  06/03/12 102/68  11/27/11 108/70  05/29/11 100/72

## 2012-06-03 NOTE — Assessment & Plan Note (Signed)
stable overall by history and exam, recent data reviewed with pt, and pt to continue medical treatment as before,  to f/u any worsening symptoms or concerns Lab Results  Component Value Date   LDLCALC 53 05/31/2012

## 2012-06-03 NOTE — Assessment & Plan Note (Signed)
stable overall by history and exam, recent data reviewed with pt, and pt to continue medical treatment as before,  to f/u any worsening symptoms or concerns Lab Results  Component Value Date   HGBA1C 7.0* 05/31/2012

## 2012-06-03 NOTE — Progress Notes (Signed)
Subjective:     Patient ID: Trevor Anderson, male   DOB: 1956-04-15, 57 y.o.   MRN: [id number]  HPI  Here to f/u; overall doing ok,  Pt denies chest pain, increased sob or doe, wheezing, orthopnea, PND, increased LE swelling, palpitations, dizziness or syncope.  Pt denies polydipsia, polyuria, or low sugar symptoms such as weakness or confusion improved with po intake.  Pt denies new neurological symptoms such as new headache, or facial or extremity weakness or numbness.   Pt states overall good compliance with meds, has been trying to follow lower cholesterol, diabetic diet, with wt overall stable,  but little exercise however.  Has low grade fever today but does not feel ill Past Medical History  Diagnosis Date  . 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 CONGESTIVE HEART FAILURE 11/12/2007  . DIABETES MELLITUS, TYPE II 11/20/2008  . HYPERLIPIDEMIA 11/12/2007  . HYPERTENSION 11/12/2007  . TACHYCARDIA 01/24/2010   Past Surgical History  Procedure Laterality Date  . Tonsillectomy  1970    reports that he has never smoked. He does not have any smokeless tobacco history on file. He reports that  drinks alcohol. He reports that he does not use illicit drugs. family history includes Kidney disease in his mother. No Known Allergies Current Outpatient Prescriptions on File Prior to Visit  Medication Sig Dispense Refill  . amLODipine (NORVASC) 5 MG tablet Take 1 tablet (5 mg total) by mouth daily.  90 tablet  3  . aspirin 81 MG tablet Take 81 mg by mouth daily.        . carvedilol (COREG) 25 MG tablet Take 1 tablet (25 mg total) by mouth 2 (two) times daily with a meal.  180 tablet  3  . furosemide (LASIX) 40 MG tablet Take 1 tablet (40 mg total) by mouth daily.  90 tablet  3  . hydrALAZINE (APRESOLINE) 25 MG tablet TAKE ONE TABLET BY MOUTH THREE TIMES DAILY  270 tablet  3  . isosorbide mononitrate (IMDUR) 60 MG 24 hr tablet Take 1 tablet (60 mg total) by mouth daily.  90 tablet  2  . Lancets MISC 1  application by Does not apply route daily.  100 each  11  . lisinopril (PRINIVIL,ZESTRIL) 20 MG tablet Take 1 tablet (20 mg total) by mouth daily.  90 tablet  3  . metFORMIN (GLUCOPHAGE) 500 MG tablet Take 1 tablet (500 mg total) by mouth daily.  90 tablet  3  . potassium chloride SA (K-DUR,KLOR-CON) 20 MEQ tablet Take 1 tablet (20 mEq total) by mouth daily.  90 tablet  3  . simvastatin (ZOCOR) 40 MG tablet Take 1 tablet (40 mg total) by mouth at bedtime.  90 tablet  3   No current facility-administered medications on file prior to visit.   Review of Systems  Constitutional: Negative for unexpected weight change, or unusual diaphoresis  HENT: Negative for tinnitus.   Eyes: Negative for photophobia and visual disturbance.  Respiratory: Negative for choking and stridor.   Gastrointestinal: Negative for vomiting and blood in stool.  Genitourinary: Negative for hematuria and decreased urine volume.  Musculoskeletal: Negative for acute joint swelling Skin: Negative for color change and wound.  Neurological: Negative for tremors and numbness other than noted  Psychiatric/Behavioral: Negative for decreased concentration or  hyperactivity.       Objective:   Physical Exam BP 102/68  Pulse 80  Temp(Src) 100 F (37.8 C) (Oral)  Ht 5\' 11"  (1.803 m)  Wt 238 lb (107.956 kg)  BMI 33.21 kg/m2  SpO2 97% VS noted,  Constitutional: Pt appears well-developed and well-nourished.  HENT: Head: NCAT.  Right Ear: External ear normal.  Left Ear: External ear normal.  Eyes: Conjunctivae and EOM are normal. Pupils are equal, round, and reactive to light.  Neck: Normal range of motion. Neck supple.  Cardiovascular: Normal rate and regular rhythm.   Pulmonary/Chest: Effort normal and breath sounds normal.  Abd:  Soft, NT, non-distended, + BS Neurological: Pt is alert. Not confused  Skin: Skin is warm. No erythema.  Psychiatric: Pt behavior is normal. Thought content normal.      Assessment:       Plan:

## 2012-06-03 NOTE — Patient Instructions (Signed)
Please continue all other medications as before, and refills have been done if requested. Please continue your efforts at being more active, low cholesterol diet, and weight control. You had the flu shot today Thank you for enrolling in MyChart. Please follow the instructions below to securely access your online medical record. MyChart allows you to send messages to your doctor, view your test results, renew your prescriptions, schedule appointments, and more. To Log into My Chart online, please go by [employer] or [employer] to [email or web address], or download the MyChart App from the [employer] of [employer] .  Your Username is: [email or web address]  (pass mychart) Please send a practice Message on Mychart later today. Please return in 6 months, or sooner if needed, with Lab testing done 3-5 days before

## 2012-11-20 ENCOUNTER — Other Ambulatory Visit: Payer: Self-pay | Admitting: Internal Medicine

## 2012-11-27 ENCOUNTER — Other Ambulatory Visit: Payer: Self-pay | Admitting: Internal Medicine

## 2012-11-29 ENCOUNTER — Other Ambulatory Visit (INDEPENDENT_AMBULATORY_CARE_PROVIDER_SITE_OTHER): Payer: BC Managed Care – PPO

## 2012-11-29 DIAGNOSIS — IMO0001 Reserved for inherently not codable concepts without codable children: Secondary | ICD-10-CM

## 2012-11-29 DIAGNOSIS — Z0000 Encounter for general adult medical examination without abnormal findings: Secondary | ICD-10-CM

## 2012-11-29 LAB — URINALYSIS, ROUTINE W REFLEX MICROSCOPIC
Specific Gravity, Urine: 1.025 (ref 1.000–1.030)
Urine Glucose: NEGATIVE
Urobilinogen, UA: 4 (ref 0.0–1.0)
pH: 6 (ref 5.0–8.0)

## 2012-11-29 LAB — BASIC METABOLIC PANEL
Calcium: 9.1 mg/dL (ref 8.4–10.5)
GFR: 51.95 mL/min — ABNORMAL LOW (ref >60.00)
Glucose, Bld: 97 mg/dL (ref 70–99)
Sodium: 143 mEq/L (ref 135–145)

## 2012-11-29 LAB — HEPATIC FUNCTION PANEL
Albumin: 4 g/dL (ref 3.5–5.2)
Alkaline Phosphatase: 59 U/L (ref 39–117)
Bilirubin, Direct: 0.1 mg/dL (ref 0.0–0.3)
Total Bilirubin: 0.6 mg/dL (ref 0.3–1.2)

## 2012-11-29 LAB — CBC WITH DIFFERENTIAL/PLATELET
Basophils Absolute: 0.1 10*3/uL (ref 0.0–0.1)
HCT: 38.9 % — ABNORMAL LOW (ref 39.0–52.0)
Hemoglobin: 12.6 g/dL — ABNORMAL LOW (ref 13.0–17.0)
Lymphs Abs: 1.9 10*3/uL (ref 0.7–4.0)
MCV: 93.2 fl (ref 78.0–100.0)
Monocytes Absolute: 0.5 10*3/uL (ref 0.1–1.0)
Monocytes Relative: 7.1 % (ref 3.0–12.0)
Neutro Abs: 4.5 10*3/uL (ref 1.4–7.7)
RDW: 14.6 % (ref 11.5–14.6)

## 2012-11-29 LAB — LIPID PANEL
HDL: 34.6 mg/dL — ABNORMAL LOW (ref >39.00)
LDL Cholesterol: 63 mg/dL (ref 0–99)
Total CHOL/HDL Ratio: 3
VLDL: 22.2 mg/dL (ref 0.0–40.0)

## 2012-12-02 ENCOUNTER — Ambulatory Visit (INDEPENDENT_AMBULATORY_CARE_PROVIDER_SITE_OTHER): Payer: BC Managed Care – PPO | Admitting: Internal Medicine

## 2012-12-02 ENCOUNTER — Other Ambulatory Visit: Payer: Self-pay | Admitting: Internal Medicine

## 2012-12-02 ENCOUNTER — Encounter: Payer: Self-pay | Admitting: Internal Medicine

## 2012-12-02 VITALS — BP 110/76 | HR 69 | Temp 99.1°F | Ht 72.0 in | Wt 238.4 lb

## 2012-12-02 DIAGNOSIS — Z0000 Encounter for general adult medical examination without abnormal findings: Secondary | ICD-10-CM

## 2012-12-02 DIAGNOSIS — N289 Disorder of kidney and ureter, unspecified: Secondary | ICD-10-CM

## 2012-12-02 DIAGNOSIS — E119 Type 2 diabetes mellitus without complications: Secondary | ICD-10-CM

## 2012-12-02 MED ORDER — SIMVASTATIN 40 MG PO TABS: 40.0000 mg | ORAL_TABLET | Freq: Every day | ORAL | Status: DC

## 2012-12-02 NOTE — Assessment & Plan Note (Signed)
For f/u next visit, cont same meds volume stable

## 2012-12-02 NOTE — Assessment & Plan Note (Signed)

## 2012-12-02 NOTE — Assessment & Plan Note (Signed)
stable overall by history and exam, recent data reviewed with pt, and pt to continue medical treatment as before,  to f/u any worsening symptoms or concerns Lab Results  Component Value Date   HGBA1C 6.8* 11/29/2012

## 2012-12-02 NOTE — Progress Notes (Signed)
Subjective:    Patient ID: Trevor Anderson, male    DOB: 25-Dec-1955, 57 y.o.   MRN: [id number]  HPI  Here for wellness and f/u;  Overall doing ok;  Pt denies CP, worsening SOB, DOE, wheezing, orthopnea, PND, worsening LE edema, palpitations, dizziness or syncope.  Pt denies neurological change such as new headache, facial or extremity weakness.  Pt denies polydipsia, polyuria, or low sugar symptoms. Pt states overall good compliance with treatment and medications, good tolerability, and has been trying to follow lower cholesterol diet.  Pt denies worsening depressive symptoms, suicidal ideation or panic. No fever, night sweats, wt loss, loss of appetite, or other constitutional symptoms.  Pt states good ability with ADL's, has low fall risk, home safety reviewed and adequate, no other significant changes in hearing or vision, and only occasionally active with exercise.  No acute complaints Past Medical History  Diagnosis Date  . 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 CONGESTIVE HEART FAILURE 11/12/2007  . DIABETES MELLITUS, TYPE II 11/20/2008  . HYPERLIPIDEMIA 11/12/2007  . HYPERTENSION 11/12/2007  . TACHYCARDIA 01/24/2010   Past Surgical History  Procedure Laterality Date  . Tonsillectomy  1970    reports that he has never smoked. He does not have any smokeless tobacco history on file. He reports that  drinks alcohol. He reports that he does not use illicit drugs. family history includes Kidney disease in his mother. No Known Allergies Current Outpatient Prescriptions on File Prior to Visit  Medication Sig Dispense Refill  . amLODipine (NORVASC) 5 MG tablet Take 1 tablet (5 mg total) by mouth daily.  90 tablet  3  . aspirin 81 MG tablet Take 81 mg by mouth daily.        . carvedilol (COREG) 25 MG tablet TAKE ONE TABLET BY MOUTH TWICE DAILY WITH A MEAL  180 tablet  0  . furosemide (LASIX) 40 MG tablet TAKE ONE TABLET BY MOUTH EVERY DAY  90 tablet  0  . hydrALAZINE (APRESOLINE) 25 MG tablet TAKE  ONE TABLET BY MOUTH THREE TIMES DAILY  270 tablet  3  . isosorbide mononitrate (IMDUR) 60 MG 24 hr tablet Take 1 tablet (60 mg total) by mouth daily.  90 tablet  2  . isosorbide mononitrate (IMDUR) 60 MG 24 hr tablet TAKE ONE TABLET BY MOUTH EVERY DAY  90 tablet  2  . KLOR-CON M20 20 MEQ tablet TAKE ONE TABLET BY MOUTH EVERY DAY  90 tablet  0  . Lancets MISC 1 application by Does not apply route daily.  100 each  11  . lisinopril (PRINIVIL,ZESTRIL) 20 MG tablet Take 1 tablet (20 mg total) by mouth daily.  90 tablet  3  . metFORMIN (GLUCOPHAGE) 500 MG tablet Take 1 tablet (500 mg total) by mouth daily.  90 tablet  3  . simvastatin (ZOCOR) 40 MG tablet Take 1 tablet (40 mg total) by mouth at bedtime.  90 tablet  3   No current facility-administered medications on file prior to visit.   Review of Systems Constitutional: Negative for diaphoresis, activity change, appetite change or unexpected weight change.  HENT: Negative for hearing loss, ear pain, facial swelling, mouth sores and neck stiffness.   Eyes: Negative for pain, redness and visual disturbance.  Respiratory: Negative for shortness of breath and wheezing.   Cardiovascular: Negative for chest pain and palpitations.  Gastrointestinal: Negative for diarrhea, blood in stool, abdominal distention or other pain Genitourinary: Negative for hematuria, flank pain or change in urine volume.  Musculoskeletal: Negative for myalgias and joint swelling.  Skin: Negative for color change and wound.  Neurological: Negative for syncope and numbness. other than noted Hematological: Negative for adenopathy.  Psychiatric/Behavioral: Negative for hallucinations, self-injury, decreased concentration and agitation.      Objective:   Physical Exam BP 110/76  Pulse 69  Temp(Src) 99.1 F (37.3 C) (Oral)  Ht 6' (1.829 m)  Wt 238 lb 6 oz (108.126 kg)  BMI 32.32 kg/m2  SpO2 97% VS noted,  Constitutional: Pt is oriented to person, place, and time.  Appears well-developed and well-nourished.  Head: Normocephalic and atraumatic.  Right Ear: External ear normal.  Left Ear: External ear normal.  Nose: Nose normal.  Mouth/Throat: Oropharynx is clear and moist.  Eyes: Conjunctivae and EOM are normal. Pupils are equal, round, and reactive to light.  Neck: Normal range of motion. Neck supple. No JVD present. No tracheal deviation present.  Cardiovascular: Normal rate, regular rhythm, normal heart sounds and intact distal pulses.   Pulmonary/Chest: Effort normal and breath sounds normal.  Abdominal: Soft. Bowel sounds are normal. There is no tenderness. No HSM  Musculoskeletal: Normal range of motion. Exhibits no edema.  Lymphadenopathy:  Has no cervical adenopathy.  Neurological: Pt is alert and oriented to person, place, and time. Pt has normal reflexes. No cranial nerve deficit.  Skin: Skin is warm and dry. No rash noted.  Psychiatric:  Has  normal mood and affect. Behavior is normal.     Assessment & Plan:

## 2012-12-02 NOTE — Patient Instructions (Signed)
Please continue all other medications as before, and refills have been done if requested. Please have the pharmacy call with any other refills you may need. Please continue your efforts at being more active, low cholesterol diabetic diet, and weight loss Please keep your appointments with your specialists as you may have planned Please remember to sign up for My Chart if you have not done so, as this will be important to you in the future with finding out test results, communicating by private email, and scheduling acute appointments online when needed.  Please return in 6 months, or sooner if needed, with Lab testing done 3-5 days before

## 2012-12-02 NOTE — Addendum Note (Signed)
Addended by: [name] on: 12/02/2012 09:28 AM   Modules accepted: Orders

## 2012-12-04 ENCOUNTER — Other Ambulatory Visit: Payer: Self-pay | Admitting: Internal Medicine

## 2013-02-03 ENCOUNTER — Other Ambulatory Visit: Payer: Self-pay | Admitting: Internal Medicine

## 2013-02-16 ENCOUNTER — Other Ambulatory Visit: Payer: Self-pay | Admitting: Internal Medicine

## 2013-02-22 ENCOUNTER — Other Ambulatory Visit: Payer: Self-pay | Admitting: Internal Medicine

## 2013-02-27 ENCOUNTER — Other Ambulatory Visit: Payer: Self-pay

## 2013-05-17 ENCOUNTER — Other Ambulatory Visit: Payer: Self-pay | Admitting: Internal Medicine

## 2013-06-02 ENCOUNTER — Other Ambulatory Visit (INDEPENDENT_AMBULATORY_CARE_PROVIDER_SITE_OTHER): Payer: BC Managed Care – PPO

## 2013-06-02 DIAGNOSIS — E119 Type 2 diabetes mellitus without complications: Secondary | ICD-10-CM

## 2013-06-02 LAB — HEMOGLOBIN A1C: Hgb A1c MFr Bld: 6.9 % — ABNORMAL HIGH (ref 4.6–6.5)

## 2013-06-02 LAB — LIPID PANEL
CHOLESTEROL: 134 mg/dL (ref 0–200)
HDL: 35 mg/dL — ABNORMAL LOW (ref >39.00)
LDL CALC: 78 mg/dL (ref 0–99)
Total CHOL/HDL Ratio: 4
Triglycerides: 106 mg/dL (ref 0.0–149.0)
VLDL: 21.2 mg/dL (ref 0.0–40.0)

## 2013-06-02 LAB — BASIC METABOLIC PANEL
BUN: 18 mg/dL (ref 6–23)
CALCIUM: 9.1 mg/dL (ref 8.4–10.5)
CHLORIDE: 109 meq/L (ref 96–112)
CO2: 26 mEq/L (ref 19–32)
CREATININE: 1.3 mg/dL (ref 0.4–1.5)
GFR: 76.26 mL/min (ref >60.00)
Glucose, Bld: 106 mg/dL — ABNORMAL HIGH (ref 70–99)
POTASSIUM: 4 meq/L (ref 3.5–5.1)
SODIUM: 144 meq/L (ref 135–145)

## 2013-06-02 LAB — HEPATIC FUNCTION PANEL
ALBUMIN: 3.9 g/dL (ref 3.5–5.2)
ALT: 8 U/L (ref 0–53)
AST: 17 U/L (ref 0–37)
Alkaline Phosphatase: 57 U/L (ref 39–117)
Bilirubin, Direct: 0.1 mg/dL (ref 0.0–0.3)
Total Bilirubin: 0.6 mg/dL (ref 0.3–1.2)
Total Protein: 7.8 g/dL (ref 6.0–8.3)

## 2013-06-04 ENCOUNTER — Ambulatory Visit: Payer: BC Managed Care – PPO | Admitting: Internal Medicine

## 2013-06-19 ENCOUNTER — Ambulatory Visit: Payer: BC Managed Care – PPO | Admitting: Internal Medicine

## 2013-06-24 ENCOUNTER — Ambulatory Visit (INDEPENDENT_AMBULATORY_CARE_PROVIDER_SITE_OTHER): Payer: BC Managed Care – PPO | Admitting: Internal Medicine

## 2013-06-24 ENCOUNTER — Encounter: Payer: Self-pay | Admitting: Internal Medicine

## 2013-06-24 VITALS — BP 128/82 | HR 77 | Temp 98.6°F | Ht 72.0 in | Wt 248.2 lb

## 2013-06-24 DIAGNOSIS — I10 Essential (primary) hypertension: Secondary | ICD-10-CM

## 2013-06-24 DIAGNOSIS — E119 Type 2 diabetes mellitus without complications: Secondary | ICD-10-CM

## 2013-06-24 DIAGNOSIS — E1165 Type 2 diabetes mellitus with hyperglycemia: Secondary | ICD-10-CM

## 2013-06-24 DIAGNOSIS — E785 Hyperlipidemia, unspecified: Secondary | ICD-10-CM

## 2013-06-24 DIAGNOSIS — IMO0001 Reserved for inherently not codable concepts without codable children: Secondary | ICD-10-CM

## 2013-06-24 DIAGNOSIS — Z0000 Encounter for general adult medical examination without abnormal findings: Secondary | ICD-10-CM

## 2013-06-24 NOTE — Progress Notes (Signed)
Pre visit review using our clinic review tool, if applicable. No additional management support is needed unless otherwise documented below in the visit note. 

## 2013-06-24 NOTE — Progress Notes (Signed)
Subjective:    Patient ID: Trevor Anderson, male    DOB: [birth year], 58 y.o.   MRN: [id number]  HPI  Here to f/u; overall doing ok,  Pt denies chest pain, increased sob or doe, wheezing, orthopnea, PND, increased LE swelling, palpitations, dizziness or syncope.  Pt denies polydipsia, polyuria, or low sugar symptoms such as weakness or confusion improved with po intake.  Pt denies new neurological symptoms such as new headache, or facial or extremity weakness or numbness.   Pt states overall good compliance with meds, has been trying to follow lower cholesterol, diabetic diet, with wt overall stable,  but little exercise however. No acute complaitns Past Medical History  Diagnosis Date  . 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 CONGESTIVE HEART FAILURE 11/12/2007  . DIABETES MELLITUS, TYPE II 11/20/2008  . HYPERLIPIDEMIA 11/12/2007  . HYPERTENSION 11/12/2007  . TACHYCARDIA 01/24/2010   Past Surgical History  Procedure Laterality Date  . Tonsillectomy  1970    reports that he has never smoked. He does not have any smokeless tobacco history on file. He reports that he drinks alcohol. He reports that he does not use illicit drugs. family history includes Kidney disease in his mother. No Known Allergies Current Outpatient Prescriptions on File Prior to Visit  Medication Sig Dispense Refill  . amLODipine (NORVASC) 5 MG tablet TAKE ONE TABLET BY MOUTH EVERY DAY  90 tablet  3  . aspirin 81 MG tablet Take 81 mg by mouth daily.        . carvedilol (COREG) 25 MG tablet TAKE ONE TABLET BY MOUTH TWICE DAILY WITH MEALS  180 tablet  3  . furosemide (LASIX) 40 MG tablet TAKE ONE TABLET BY MOUTH ONCE DAILY  90 tablet  3  . hydrALAZINE (APRESOLINE) 25 MG tablet TAKE ONE TABLET BY MOUTH THREE TIMES DAILY  270 tablet  3  . isosorbide mononitrate (IMDUR) 60 MG 24 hr tablet Take 1 tablet (60 mg total) by mouth daily.  90 tablet  2  . isosorbide mononitrate (IMDUR) 60 MG 24 hr tablet TAKE ONE TABLET BY MOUTH EVERY  DAY  90 tablet  2  . KLOR-CON M20 20 MEQ tablet TAKE ONE TABLET BY MOUTH ONCE DAILY  90 tablet  3  . Lancets MISC 1 application by Does not apply route daily.  100 each  11  . lisinopril (PRINIVIL,ZESTRIL) 20 MG tablet TAKE ONE TABLET BY MOUTH EVERY DAY  90 tablet  1  . metFORMIN (GLUCOPHAGE) 500 MG tablet TAKE ONE TABLET BY MOUTH ONCE DAILY  90 tablet  2  . simvastatin (ZOCOR) 40 MG tablet Take 1 tablet (40 mg total) by mouth at bedtime.  90 tablet  3   No current facility-administered medications on file prior to visit.    Review of Systems  Constitutional: Negative for unexpected weight change, or unusual diaphoresis  HENT: Negative for tinnitus.   Eyes: Negative for photophobia and visual disturbance.  Respiratory: Negative for choking and stridor.   Gastrointestinal: Negative for vomiting and blood in stool.  Genitourinary: Negative for hematuria and decreased urine volume.  Musculoskeletal: Negative for acute joint swelling Skin: Negative for color change and wound.  Neurological: Negative for tremors and numbness other than noted  Psychiatric/Behavioral: Negative for decreased concentration or  hyperactivity.       Objective:   Physical Exam BP 128/82  Pulse 77  Temp(Src) 98.6 F (37 C) (Oral)  Ht 6' (1.829 m)  Wt 248 lb 4 oz (112.605 kg)  BMI 33.66 kg/m2  SpO2 97% VS noted,  Constitutional: Pt appears well-developed and well-nourished.  HENT: Head: NCAT.  Right Ear: External ear normal.  Left Ear: External ear normal.  Eyes: Conjunctivae and EOM are normal. Pupils are equal, round, and reactive to light.  Neck: Normal range of motion. Neck supple.  Cardiovascular: Normal rate and regular rhythm.   Pulmonary/Chest: Effort normal and breath sounds normal.  Abd:  Soft, NT, non-distended, + BS Neurological: Pt is alert. Not confused  Skin: Skin is warm. No erythema.  Psychiatric: Pt behavior is normal. Thought content normal.     Assessment & Plan:

## 2013-06-24 NOTE — Assessment & Plan Note (Signed)
stable overall by history and exam, recent data reviewed with pt, and pt to continue medical treatment as before,  to f/u any worsening symptoms or concerns Lab Results  Component Value Date   LDLCALC 78 06/02/2013

## 2013-06-24 NOTE — Patient Instructions (Signed)
Please continue all other medications as before, and refills have been done if requested. Please have the pharmacy call with any other refills you may need. Please continue your efforts at being more active, low cholesterol diabetic diet, and weight control.  Please return in 6 months, or sooner if needed, with Lab testing done 3-5 days before

## 2013-06-24 NOTE — Assessment & Plan Note (Signed)
stable overall by history and exam, recent data reviewed with pt, and pt to continue medical treatment as before,  to f/u any worsening symptoms or concerns Lab Results  Component Value Date   HGBA1C 6.9* 06/02/2013

## 2013-06-24 NOTE — Assessment & Plan Note (Signed)
stable overall by history and exam, recent data reviewed with pt, and pt to continue medical treatment as before,  to f/u any worsening symptoms or concerns BP Readings from Last 3 Encounters:  06/24/13 128/82  12/02/12 110/76  06/03/12 102/68

## 2013-06-25 ENCOUNTER — Telehealth: Payer: Self-pay

## 2013-06-25 NOTE — Telephone Encounter (Signed)
Relevant patient education assigned to patient using Emmi. ° °

## 2013-08-07 ENCOUNTER — Other Ambulatory Visit: Payer: Self-pay | Admitting: Internal Medicine

## 2013-08-20 ENCOUNTER — Other Ambulatory Visit: Payer: Self-pay | Admitting: Internal Medicine

## 2013-11-26 ENCOUNTER — Other Ambulatory Visit: Payer: Self-pay | Admitting: Internal Medicine

## 2013-12-15 ENCOUNTER — Other Ambulatory Visit (INDEPENDENT_AMBULATORY_CARE_PROVIDER_SITE_OTHER): Payer: BC Managed Care – PPO

## 2013-12-15 DIAGNOSIS — E1165 Type 2 diabetes mellitus with hyperglycemia: Principal | ICD-10-CM

## 2013-12-15 DIAGNOSIS — IMO0001 Reserved for inherently not codable concepts without codable children: Secondary | ICD-10-CM

## 2013-12-15 DIAGNOSIS — Z0000 Encounter for general adult medical examination without abnormal findings: Secondary | ICD-10-CM

## 2013-12-15 LAB — CBC WITH DIFFERENTIAL/PLATELET
BASOS ABS: 0 10*3/uL (ref 0.0–0.1)
Basophils Relative: 0.3 % (ref 0.0–3.0)
EOS ABS: 0.1 10*3/uL (ref 0.0–0.7)
Eosinophils Relative: 2.4 % (ref 0.0–5.0)
HCT: 41.6 % (ref 39.0–52.0)
Hemoglobin: 14 g/dL (ref 13.0–17.0)
Lymphocytes Relative: 24.3 % (ref 12.0–46.0)
Lymphs Abs: 1.4 10*3/uL (ref 0.7–4.0)
MCHC: 33.5 g/dL (ref 30.0–36.0)
MCV: 94.3 fl (ref 78.0–100.0)
Monocytes Absolute: 0.3 10*3/uL (ref 0.1–1.0)
Monocytes Relative: 4.9 % (ref 3.0–12.0)
NEUTROS PCT: 68.1 % (ref 43.0–77.0)
Neutro Abs: 3.9 10*3/uL (ref 1.4–7.7)
PLATELETS: 231 10*3/uL (ref 150.0–400.0)
RBC: 4.42 Mil/uL (ref 4.22–5.81)
RDW: 13.4 % (ref 11.5–15.5)
WBC: 5.7 10*3/uL (ref 4.0–10.5)

## 2013-12-15 LAB — BASIC METABOLIC PANEL
BUN: 18 mg/dL (ref 6–23)
CHLORIDE: 106 meq/L (ref 96–112)
CO2: 26 meq/L (ref 19–32)
Calcium: 8.9 mg/dL (ref 8.4–10.5)
Creatinine, Ser: 1.2 mg/dL (ref 0.4–1.5)
GFR: 77.53 mL/min (ref >60.00)
Glucose, Bld: 117 mg/dL — ABNORMAL HIGH (ref 70–99)
POTASSIUM: 4 meq/L (ref 3.5–5.1)
Sodium: 142 mEq/L (ref 135–145)

## 2013-12-15 LAB — LIPID PANEL
Cholesterol: 137 mg/dL (ref 0–200)
HDL: 34.7 mg/dL — AB (ref >39.00)
LDL Cholesterol: 80 mg/dL (ref 0–99)
NonHDL: 102.3
TRIGLYCERIDES: 114 mg/dL (ref 0.0–149.0)
Total CHOL/HDL Ratio: 4
VLDL: 22.8 mg/dL (ref 0.0–40.0)

## 2013-12-15 LAB — URINALYSIS, ROUTINE W REFLEX MICROSCOPIC
Bilirubin Urine: NEGATIVE
HGB URINE DIPSTICK: NEGATIVE
Ketones, ur: NEGATIVE
LEUKOCYTES UA: NEGATIVE
NITRITE: NEGATIVE
RBC / HPF: NONE SEEN (ref 0–?)
Specific Gravity, Urine: 1.01 (ref 1.000–1.030)
Total Protein, Urine: NEGATIVE
UROBILINOGEN UA: 0.2 (ref 0.0–1.0)
Urine Glucose: NEGATIVE
pH: 5.5 (ref 5.0–8.0)

## 2013-12-15 LAB — HEMOGLOBIN A1C: HEMOGLOBIN A1C: 6.9 % — AB (ref 4.6–6.5)

## 2013-12-15 LAB — MICROALBUMIN / CREATININE URINE RATIO
Creatinine,U: 100.6 mg/dL
Microalb Creat Ratio: 0.9 mg/g (ref 0.0–30.0)
Microalb, Ur: 0.9 mg/dL (ref 0.0–1.9)

## 2013-12-15 LAB — HEPATIC FUNCTION PANEL
ALT: 7 U/L (ref 0–53)
AST: 16 U/L (ref 0–37)
Albumin: 3.7 g/dL (ref 3.5–5.2)
Alkaline Phosphatase: 56 U/L (ref 39–117)
BILIRUBIN DIRECT: 0.1 mg/dL (ref 0.0–0.3)
BILIRUBIN TOTAL: 0.6 mg/dL (ref 0.2–1.2)
Total Protein: 7.2 g/dL (ref 6.0–8.3)

## 2013-12-15 LAB — TSH: TSH: 1.13 u[IU]/mL (ref 0.35–4.50)

## 2013-12-15 LAB — PSA: PSA: 1.24 ng/mL (ref 0.10–4.00)

## 2013-12-16 ENCOUNTER — Encounter: Payer: Self-pay | Admitting: Internal Medicine

## 2013-12-16 ENCOUNTER — Ambulatory Visit (INDEPENDENT_AMBULATORY_CARE_PROVIDER_SITE_OTHER): Payer: BC Managed Care – PPO | Admitting: Internal Medicine

## 2013-12-16 ENCOUNTER — Ambulatory Visit: Payer: BC Managed Care – PPO | Admitting: Internal Medicine

## 2013-12-16 VITALS — BP 112/82 | HR 76 | Temp 98.5°F | Ht 71.0 in | Wt 248.0 lb

## 2013-12-16 DIAGNOSIS — Z23 Encounter for immunization: Secondary | ICD-10-CM

## 2013-12-16 DIAGNOSIS — Z0000 Encounter for general adult medical examination without abnormal findings: Secondary | ICD-10-CM

## 2013-12-16 DIAGNOSIS — E119 Type 2 diabetes mellitus without complications: Secondary | ICD-10-CM

## 2013-12-16 NOTE — Assessment & Plan Note (Signed)

## 2013-12-16 NOTE — Assessment & Plan Note (Signed)
stable overall by history and exam, recent data reviewed with pt, and pt to continue medical treatment as before,  to f/u any worsening symptoms or concerns Lab Results  Component Value Date   HGBA1C 6.9* 12/15/2013   For f/u a1c

## 2013-12-16 NOTE — Patient Instructions (Addendum)
You had the flu shot today, and the Pneumovax pneumonia shot  Please continue all other medications as before, and refills have been done if requested.  Please have the pharmacy call with any other refills you may need.  Please continue your efforts at being more active, low cholesterol diet, and weight control.  You are otherwise up to date with prevention measures today.  Please keep your appointments with your specialists as you may have planned  Please return in 6 months, or sooner if needed, with Lab testing done 3-5 days before

## 2013-12-16 NOTE — Progress Notes (Signed)
Pre visit review using our clinic review tool, if applicable. No additional management support is needed unless otherwise documented below in the visit note. 

## 2013-12-16 NOTE — Progress Notes (Signed)
Subjective:    Patient ID: Trevor Anderson, male    DOB: 04-21-1956, 58 y.o.   MRN: [id number]  HPI  Here for wellness and f/u;  Overall doing ok;  Pt denies CP, worsening SOB, DOE, wheezing, orthopnea, PND, worsening LE edema, palpitations, dizziness or syncope.  Pt denies neurological change such as new headache, facial or extremity weakness.  Pt denies polydipsia, polyuria, or low sugar symptoms. Pt states overall good compliance with treatment and medications, good tolerability, and has been trying to follow lower cholesterol diet.  Pt denies worsening depressive symptoms, suicidal ideation or panic. No fever, night sweats, wt loss, loss of appetite, or other constitutional symptoms.  Pt states good ability with ADL's, has low fall risk, home safety reviewed and adequate, no other significant changes in hearing or vision, and only occasionally active with exercise.  No current complaints Past Medical History  Diagnosis Date  . 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 CONGESTIVE HEART FAILURE 11/12/2007  . DIABETES MELLITUS, TYPE II 11/20/2008  . HYPERLIPIDEMIA 11/12/2007  . HYPERTENSION 11/12/2007  . TACHYCARDIA 01/24/2010   Past Surgical History  Procedure Laterality Date  . Tonsillectomy  1970    reports that he has never smoked. He does not have any smokeless tobacco history on file. He reports that he drinks alcohol. He reports that he does not use illicit drugs. family history includes Kidney disease in his mother. No Known Allergies Current Outpatient Prescriptions on File Prior to Visit  Medication Sig Dispense Refill  . amLODipine (NORVASC) 5 MG tablet TAKE ONE TABLET BY MOUTH ONCE DAILY  90 tablet  3  . aspirin 81 MG tablet Take 81 mg by mouth daily.        . carvedilol (COREG) 25 MG tablet TAKE ONE TABLET BY MOUTH TWICE DAILY WITH MEALS  180 tablet  3  . furosemide (LASIX) 40 MG tablet TAKE ONE TABLET BY MOUTH ONCE DAILY  90 tablet  3  . hydrALAZINE (APRESOLINE) 25 MG tablet TAKE  ONE TABLET BY MOUTH THREE TIMES DAILY  270 tablet  0  . isosorbide mononitrate (IMDUR) 60 MG 24 hr tablet Take 1 tablet (60 mg total) by mouth daily.  90 tablet  2  . isosorbide mononitrate (IMDUR) 60 MG 24 hr tablet TAKE ONE TABLET BY MOUTH ONCE DAILY  90 tablet  3  . KLOR-CON M20 20 MEQ tablet TAKE ONE TABLET BY MOUTH ONCE DAILY  90 tablet  3  . Lancets MISC 1 application by Does not apply route daily.  100 each  11  . lisinopril (PRINIVIL,ZESTRIL) 20 MG tablet TAKE ONE TABLET BY MOUTH ONCE DAILY  90 tablet  3  . metFORMIN (GLUCOPHAGE) 500 MG tablet TAKE ONE TABLET BY MOUTH ONCE DAILY  90 tablet  2  . simvastatin (ZOCOR) 40 MG tablet TAKE ONE TABLET BY MOUTH AT BEDTIME  90 tablet  3   No current facility-administered medications on file prior to visit.   Review of Systems Constitutional: Negative for increased diaphoresis, other activity, appetite or other siginficant weight change  HENT: Negative for worsening hearing loss, ear pain, facial swelling, mouth sores and neck stiffness.   Eyes: Negative for other worsening pain, redness or visual disturbance.  Respiratory: Negative for shortness of breath and wheezing.   Cardiovascular: Negative for chest pain and palpitations.  Gastrointestinal: Negative for diarrhea, blood in stool, abdominal distention or other pain Genitourinary: Negative for hematuria, flank pain or change in urine volume.  Musculoskeletal: Negative for myalgias or  other joint complaints.  Skin: Negative for color change and wound.  Neurological: Negative for syncope and numbness. other than noted Hematological: Negative for adenopathy. or other swelling Psychiatric/Behavioral: Negative for hallucinations, self-injury, decreased concentration or other worsening agitation.      Objective:   Physical Exam BP 112/82  Pulse 76  Temp(Src) 98.5 F (36.9 C) (Oral)  Ht 5\' 11"  (1.803 m)  Wt 248 lb (112.492 kg)  BMI 34.60 kg/m2  SpO2 95% VS noted,  Constitutional: Pt is  oriented to person, place, and time. Appears well-developed and well-nourished.  Head: Normocephalic and atraumatic.  Right Ear: External ear normal.  Left Ear: External ear normal.  Nose: Nose normal.  Mouth/Throat: Oropharynx is clear and moist.  Eyes: Conjunctivae and EOM are normal. Pupils are equal, round, and reactive to light.  Neck: Normal range of motion. Neck supple. No JVD present. No tracheal deviation present.  Cardiovascular: Normal rate, regular rhythm, normal heart sounds and intact distal pulses.   Pulmonary/Chest: Effort normal and breath sounds without rales or wheezing  Abdominal: Soft. Bowel sounds are normal. NT. No HSM  Musculoskeletal: Normal range of motion. Exhibits no edema.  Lymphadenopathy:  Has no cervical adenopathy.  Neurological: Pt is alert and oriented to person, place, and time. Pt has normal reflexes. No cranial nerve deficit. Motor grossly intact Skin: Skin is warm and dry. No rash noted.  Psychiatric:  Has normal mood and affect. Behavior is normal.     Assessment & Plan:

## 2014-02-09 ENCOUNTER — Other Ambulatory Visit: Payer: Self-pay | Admitting: Internal Medicine

## 2014-02-14 ENCOUNTER — Other Ambulatory Visit: Payer: Self-pay | Admitting: Internal Medicine

## 2014-02-25 ENCOUNTER — Other Ambulatory Visit: Payer: Self-pay | Admitting: Internal Medicine

## 2014-05-28 ENCOUNTER — Other Ambulatory Visit (INDEPENDENT_AMBULATORY_CARE_PROVIDER_SITE_OTHER): Payer: BLUE CROSS/BLUE SHIELD

## 2014-05-28 ENCOUNTER — Other Ambulatory Visit: Payer: Self-pay | Admitting: Internal Medicine

## 2014-05-28 DIAGNOSIS — E119 Type 2 diabetes mellitus without complications: Secondary | ICD-10-CM

## 2014-05-28 LAB — BASIC METABOLIC PANEL
BUN: 24 mg/dL — ABNORMAL HIGH (ref 6–23)
CO2: 24 meq/L (ref 19–32)
Calcium: 9.3 mg/dL (ref 8.4–10.5)
Chloride: 110 mEq/L (ref 96–112)
Creatinine, Ser: 1.57 mg/dL — ABNORMAL HIGH (ref 0.40–1.50)
GFR: 58.95 mL/min — ABNORMAL LOW (ref >60.00)
Glucose, Bld: 92 mg/dL (ref 70–99)
POTASSIUM: 4.2 meq/L (ref 3.5–5.1)
Sodium: 141 mEq/L (ref 135–145)

## 2014-05-28 LAB — HEPATIC FUNCTION PANEL
ALBUMIN: 4.1 g/dL (ref 3.5–5.2)
ALK PHOS: 65 U/L (ref 39–117)
ALT: 7 U/L (ref 0–53)
AST: 16 U/L (ref 0–37)
BILIRUBIN DIRECT: 0.1 mg/dL (ref 0.0–0.3)
BILIRUBIN TOTAL: 0.5 mg/dL (ref 0.2–1.2)
Total Protein: 7.3 g/dL (ref 6.0–8.3)

## 2014-05-28 LAB — LIPID PANEL
Cholesterol: 129 mg/dL (ref 0–200)
HDL: 34.4 mg/dL — ABNORMAL LOW (ref >39.00)
LDL CALC: 71 mg/dL (ref 0–99)
NONHDL: 94.6
Total CHOL/HDL Ratio: 4
Triglycerides: 118 mg/dL (ref 0.0–149.0)
VLDL: 23.6 mg/dL (ref 0.0–40.0)

## 2014-05-28 LAB — HEMOGLOBIN A1C: Hgb A1c MFr Bld: 6.9 % — ABNORMAL HIGH (ref 4.6–6.5)

## 2014-06-02 ENCOUNTER — Encounter: Payer: Self-pay | Admitting: Internal Medicine

## 2014-06-02 ENCOUNTER — Ambulatory Visit (INDEPENDENT_AMBULATORY_CARE_PROVIDER_SITE_OTHER): Payer: BLUE CROSS/BLUE SHIELD | Admitting: Internal Medicine

## 2014-06-02 VITALS — BP 134/92 | HR 84 | Temp 98.8°F | Ht 71.0 in | Wt 253.0 lb

## 2014-06-02 DIAGNOSIS — Z0000 Encounter for general adult medical examination without abnormal findings: Secondary | ICD-10-CM

## 2014-06-02 DIAGNOSIS — E785 Hyperlipidemia, unspecified: Secondary | ICD-10-CM

## 2014-06-02 DIAGNOSIS — Z0189 Encounter for other specified special examinations: Secondary | ICD-10-CM

## 2014-06-02 DIAGNOSIS — E119 Type 2 diabetes mellitus without complications: Secondary | ICD-10-CM

## 2014-06-02 DIAGNOSIS — I429 Cardiomyopathy, unspecified: Secondary | ICD-10-CM

## 2014-06-02 DIAGNOSIS — N289 Disorder of kidney and ureter, unspecified: Secondary | ICD-10-CM

## 2014-06-02 DIAGNOSIS — I10 Essential (primary) hypertension: Secondary | ICD-10-CM

## 2014-06-02 NOTE — Patient Instructions (Signed)
Please continue all other medications as before, and refills have been done if requested.  Please have the pharmacy call with any other refills you may need.  Please continue your efforts at being more active, low cholesterol diet, and weight control.  You are otherwise up to date with prevention measures today.  Please keep your appointments with your specialists as you may have planned  You will be contacted regarding the referral for: Dr Bensimohn/cardiology  Please return in 6 months, or sooner if needed, with Lab testing done 3-5 days before

## 2014-06-02 NOTE — Assessment & Plan Note (Signed)
Small increased cr recent, denies orthostasis symptoms on lasix, cont same meds,  to f/u any worsening symptoms or concerns

## 2014-06-02 NOTE — Assessment & Plan Note (Signed)
stable overall by history and exam, recent data reviewed with pt, and pt to continue medical treatment as before,  to f/u any worsening symptoms or concern BP Readings from Last 3 Encounters:  06/02/14 134/92  12/16/13 112/82  06/24/13 128/82

## 2014-06-02 NOTE — Assessment & Plan Note (Signed)
Volume stable, hold on f/u echo for now (last 2011 with EF 55-60%),refer card for f/u, cont same meds

## 2014-06-02 NOTE — Assessment & Plan Note (Signed)
stable overall by history and exam, recent data reviewed with pt, and pt to continue medical treatment as before,  to f/u any worsening symptoms or concerns Lab Results  Component Value Date   HGBA1C 6.9* 05/28/2014

## 2014-06-02 NOTE — Progress Notes (Signed)
Pre visit review using our clinic review tool, if applicable. No additional management support is needed unless otherwise documented below in the visit note. 

## 2014-06-02 NOTE — Progress Notes (Signed)
Subjective:    Patient ID: Trevor Anderson, male    DOB: 06/16/1955, 59 y.o.   MRN: [id number]  HPI  Here to f/u; overall doing ok,  Pt denies chest pain, increased sob or doe, wheezing, orthopnea, PND, increased LE swelling, palpitations, dizziness or syncope.  Pt denies polydipsia, polyuria, or low sugar symptoms such as weakness or confusion improved with po intake.  Pt denies new neurological symptoms such as new headache, or facial or extremity weakness or numbness.   Pt states overall good compliance with meds, has been trying to follow lower cholesterol, diabetic diet, with wt overall stable though gained 5 lbs in past yr.,  but little exercise however. Has not seen cardiology in several yrs Past Medical History  Diagnosis Date  . 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 CONGESTIVE HEART FAILURE 11/12/2007  . DIABETES MELLITUS, TYPE II 11/20/2008  . HYPERLIPIDEMIA 11/12/2007  . HYPERTENSION 11/12/2007  . TACHYCARDIA 01/24/2010   Past Surgical History  Procedure Laterality Date  . Tonsillectomy  1970    reports that he has never smoked. He does not have any smokeless tobacco history on file. He reports that he drinks alcohol. He reports that he does not use illicit drugs. family history includes Kidney disease in his mother. No Known Allergies Current Outpatient Prescriptions on File Prior to Visit  Medication Sig Dispense Refill  . amLODipine (NORVASC) 5 MG tablet TAKE ONE TABLET BY MOUTH ONCE DAILY 90 tablet 3  . aspirin 81 MG tablet Take 81 mg by mouth daily.      . carvedilol (COREG) 25 MG tablet TAKE ONE TABLET BY MOUTH TWICE DAILY WITH MEALS 180 tablet 2  . furosemide (LASIX) 40 MG tablet TAKE ONE TABLET BY MOUTH ONCE DAILY 90 tablet 2  . hydrALAZINE (APRESOLINE) 25 MG tablet TAKE ONE TABLET BY MOUTH THREE TIMES DAILY 270 tablet 0  . isosorbide mononitrate (IMDUR) 60 MG 24 hr tablet Take 1 tablet (60 mg total) by mouth daily. 90 tablet 2  . isosorbide mononitrate (IMDUR) 60 MG 24 hr  tablet TAKE ONE TABLET BY MOUTH ONCE DAILY 90 tablet 3  . KLOR-CON M20 20 MEQ tablet TAKE ONE TABLET BY MOUTH ONCE DAILY 90 tablet 2  . Lancets MISC 1 application by Does not apply route daily. 100 each 11  . lisinopril (PRINIVIL,ZESTRIL) 20 MG tablet TAKE ONE TABLET BY MOUTH ONCE DAILY 90 tablet 3  . metFORMIN (GLUCOPHAGE) 500 MG tablet TAKE ONE TABLET BY MOUTH ONCE DAILY 90 tablet 1  . simvastatin (ZOCOR) 40 MG tablet TAKE ONE TABLET BY MOUTH AT BEDTIME 90 tablet 3   No current facility-administered medications on file prior to visit.   Review of Systems  Constitutional: Negative for unusual diaphoresis or other sweats  HENT: Negative for ringing in ear Eyes: Negative for double vision or worsening visual disturbance.  Respiratory: Negative for choking and stridor.   Gastrointestinal: Negative for vomiting or other signifcant bowel change Genitourinary: Negative for hematuria or decreased urine volume.  Musculoskeletal: Negative for other MSK pain or swelling Skin: Negative for color change and worsening wound.  Neurological: Negative for tremors and numbness other than noted  Psychiatric/Behavioral: Negative for decreased concentration or agitation other than above       Objective:   Physical Exam BP 134/92 mmHg  Pulse 84  Temp(Src) 98.8 F (37.1 C) (Oral)  Ht 5\' 11"  (1.803 m)  Wt 253 lb (114.76 kg)  BMI 35.30 kg/m2 VS noted,  Constitutional: Pt appears well-developed, well-nourished.  HENT: Head: NCAT.  Right Ear: External ear normal.  Left Ear: External ear normal.  Eyes: . Pupils are equal, round, and reactive to light. Conjunctivae and EOM are normal Neck: Normal range of motion. Neck supple.  Cardiovascular: Normal rate and regular rhythm.   Pulmonary/Chest: Effort normal and breath sounds without rales or wheezing.  Neurological: Pt is alert. Not confused , motor grossly intact Skin: Skin is warm. No rash, no LE edema Psychiatric: Pt behavior is normal. No  agitation.   Wt Readings from Last 3 Encounters:  06/02/14 253 lb (114.76 kg)  12/16/13 248 lb (112.492 kg)  06/24/13 248 lb 4 oz (112.605 kg)       Assessment & Plan:

## 2014-06-02 NOTE — Assessment & Plan Note (Signed)
stable overall by history and exam, recent data reviewed with pt, and pt to continue medical treatment as before,  to f/u any worsening symptoms or concerns Lab Results  Component Value Date   LDLCALC 71 05/28/2014

## 2014-06-09 ENCOUNTER — Telehealth: Payer: Self-pay | Admitting: Internal Medicine

## 2014-06-09 NOTE — Telephone Encounter (Signed)
emmi emailed °

## 2014-07-08 ENCOUNTER — Encounter: Payer: Self-pay | Admitting: Internal Medicine

## 2014-07-21 ENCOUNTER — Ambulatory Visit: Payer: BLUE CROSS/BLUE SHIELD | Admitting: Cardiology

## 2014-08-01 ENCOUNTER — Other Ambulatory Visit: Payer: Self-pay | Admitting: Internal Medicine

## 2014-08-08 ENCOUNTER — Other Ambulatory Visit: Payer: Self-pay | Admitting: Internal Medicine

## 2014-08-26 ENCOUNTER — Other Ambulatory Visit: Payer: Self-pay | Admitting: Internal Medicine

## 2014-09-05 NOTE — Progress Notes (Signed)
Cardiology Office Note   Date:  09/07/2014   ID:  Trevor Anderson, DOB 05-22-1955, MRN [id number]  PCP:  Oliver Barre, MD    Chief Complaint  Patient presents with  . New Evaluation    Cardiomyopathy      History of Present Illness: Trevor Anderson is a 59 y.o. male who presents for evaluation of cardiomyopathy.  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 Diona Browner and Dr. Gala Romney in the past.  He EF recovered and last echo in 2010 showed and EF of 60-65% with mild MR.  He denies any chest pain, SOB, DOE, LE edema, dizziness, palpitations or syncope.  He denies any PND or orthopnea.      Past Medical History  Diagnosis Date  . 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 CONGESTIVE HEART FAILURE 11/12/2007  . DIABETES MELLITUS, TYPE II 11/20/2008  . HYPERLIPIDEMIA 11/12/2007  . HYPERTENSION 11/12/2007  . TACHYCARDIA 01/24/2010    Past Surgical History  Procedure Laterality Date  . Tonsillectomy  1970     Current Outpatient Prescriptions  Medication Sig Dispense Refill  . amLODipine (NORVASC) 5 MG tablet TAKE ONE TABLET BY MOUTH ONCE DAILY 90 tablet 3  . aspirin 81 MG tablet Take 81 mg by mouth daily.      . carvedilol (COREG) 25 MG tablet TAKE ONE TABLET BY MOUTH TWICE DAILY WITH MEALS 180 tablet 2  . furosemide (LASIX) 40 MG tablet TAKE ONE TABLET BY MOUTH ONCE DAILY 90 tablet 2  . hydrALAZINE (APRESOLINE) 25 MG tablet TAKE ONE TABLET BY MOUTH THREE TIMES DAILY 270 tablet 0  . isosorbide mononitrate (IMDUR) 60 MG 24 hr tablet TAKE ONE TABLET BY MOUTH ONCE DAILY 90 tablet 1  . KLOR-CON M20 20 MEQ tablet TAKE ONE TABLET BY MOUTH ONCE DAILY 90 tablet 2  . Lancets MISC 1 application by Does not apply route daily. 100 each 11  . lisinopril (PRINIVIL,ZESTRIL) 20 MG tablet TAKE ONE TABLET BY MOUTH ONCE DAILY 90 tablet 1  . metFORMIN (GLUCOPHAGE) 500 MG  tablet TAKE ONE TABLET BY MOUTH ONCE DAILY 90 tablet 1  . simvastatin (ZOCOR) 40 MG tablet TAKE ONE TABLET BY MOUTH AT BEDTIME 90 tablet 3   No current facility-administered medications for this visit.    Allergies:   Review of patient's allergies indicates no known allergies.    Social History:  The patient  reports that he has never smoked. He does not have any smokeless tobacco history on file. He reports that he drinks alcohol. He reports that he does not use illicit drugs.   Family History:  The patient's family history includes Kidney disease in his mother.    ROS:  Please see the history of present illness.   Otherwise, review of systems are positive for none.   All other systems are reviewed and negative.    PHYSICAL EXAM: VS:  BP 120/86 mmHg  Pulse 75  Ht 5\' 11"  (1.803 m)  Wt 254 lb 4.8 oz (115.35 kg)  BMI 35.48 kg/m2 , BMI Body mass index is 35.48 kg/(m^2). GEN: Well nourished, well developed, in no acute distress HEENT: normal Neck: no JVD, carotid bruits, or masses Cardiac: RRR; no murmurs, rubs, or gallops,no edema  Respiratory:  clear to auscultation bilaterally, normal work of breathing GI: soft, nontender, nondistended, + BS MS: no deformity  or atrophy Skin: warm and dry, no rash Neuro:  Strength and sensation are intact Psych: euthymic mood, full affect   EKG:  EKG is ordered today. The ekg ordered today demonstrates NSR with nonspecific T wave abnormality   Recent Labs: 12/15/2013: Hemoglobin 14.0; Platelets 231.0; TSH 1.13 05/28/2014: ALT 7; BUN 24*; Creatinine 1.57*; Potassium 4.2; Sodium 141    Lipid Panel    Component Value Date/Time   CHOL 129 05/28/2014 1652   TRIG 118.0 05/28/2014 1652   HDL 34.40* 05/28/2014 1652   CHOLHDL 4 05/28/2014 1652   VLDL 23.6 05/28/2014 1652   LDLCALC 71 05/28/2014 1652      Wt Readings from Last 3 Encounters:  09/07/14 254 lb 4.8 oz (115.35 kg)  06/02/14 253 lb (114.76 kg)  12/16/13 248 lb (112.492 kg)        ASSESSMENT AND PLAN:  1.  Nonischemic DCM that has resolved by echo in 2010.  EF 60-65%.  He appears euvolemic on exam.  He has not had an echo in some time so I will get a 2D echo to reassess LVF.   2.  Moderate to severe MR - resolved by last echo 2010 3.  ASCAD with 40% focal LAD lesion, 50% IM (95% inferior branch), 20-30% LCX by cath 2009.  He has not had any angina.  I have encouraged him to get into a routine exercise program.  Continue ASA/Imdur/statin 4.  HTN - controlled on BB/ACE I/hydralazine 5.  DM 6.  Dyslipidemia - LDL at goal on statin  Current medicines are reviewed at length with the patient today.  The patient does not have concerns regarding medicines.  The following changes have been made:  no change  Labs/ tests ordered today include: see above assessment and plan No orders of the defined types were placed in this encounter.     Disposition:   FU with me in 1 year   Signed, Quintella Reichert, MD  09/07/2014 9:57 AM    [facility] [address] [place], [place], [place]  [address] Phone: [phone]; Fax: [phone]

## 2014-09-07 ENCOUNTER — Encounter: Payer: Self-pay | Admitting: Cardiology

## 2014-09-07 ENCOUNTER — Ambulatory Visit (INDEPENDENT_AMBULATORY_CARE_PROVIDER_SITE_OTHER): Payer: BLUE CROSS/BLUE SHIELD | Admitting: Cardiology

## 2014-09-07 VITALS — BP 120/86 | HR 75 | Ht 71.0 in | Wt 254.3 lb

## 2014-09-07 DIAGNOSIS — I10 Essential (primary) hypertension: Secondary | ICD-10-CM | POA: Diagnosis not present

## 2014-09-07 DIAGNOSIS — I429 Cardiomyopathy, unspecified: Secondary | ICD-10-CM

## 2014-09-07 DIAGNOSIS — E785 Hyperlipidemia, unspecified: Secondary | ICD-10-CM | POA: Diagnosis not present

## 2014-09-07 DIAGNOSIS — I2510 Atherosclerotic heart disease of native coronary artery without angina pectoris: Secondary | ICD-10-CM | POA: Diagnosis not present

## 2014-09-07 NOTE — Patient Instructions (Signed)
Medication Instructions:  Your physician recommends that you continue on your current medications as directed. Please refer to the Current Medication list given to you today.   Labwork: None  Testing/Procedures: Your physician has requested that you have an echocardiogram. Echocardiography is a painless test that uses sound waves to create images of your heart. It provides your doctor with information about the size and shape of your heart and how well your heart's chambers and valves are working. This procedure takes approximately one hour. There are no restrictions for this procedure.  Follow-Up: Your physician wants you to follow-up in: 1 year with Dr. Turner. You will receive a reminder letter in the mail two months in advance. If you don't receive a letter, please call our office to schedule the follow-up appointment.   Any Other Special Instructions Will Be Listed Below (If Applicable).   

## 2014-09-15 ENCOUNTER — Other Ambulatory Visit: Payer: Self-pay

## 2014-09-15 ENCOUNTER — Ambulatory Visit (HOSPITAL_COMMUNITY): Payer: BLUE CROSS/BLUE SHIELD | Attending: Cardiology

## 2014-09-15 DIAGNOSIS — I10 Essential (primary) hypertension: Secondary | ICD-10-CM | POA: Insufficient documentation

## 2014-09-15 DIAGNOSIS — I429 Cardiomyopathy, unspecified: Secondary | ICD-10-CM | POA: Diagnosis not present

## 2014-09-15 DIAGNOSIS — I509 Heart failure, unspecified: Secondary | ICD-10-CM | POA: Insufficient documentation

## 2014-09-15 DIAGNOSIS — E785 Hyperlipidemia, unspecified: Secondary | ICD-10-CM | POA: Diagnosis not present

## 2014-09-15 DIAGNOSIS — E119 Type 2 diabetes mellitus without complications: Secondary | ICD-10-CM | POA: Diagnosis not present

## 2014-09-15 DIAGNOSIS — I2510 Atherosclerotic heart disease of native coronary artery without angina pectoris: Secondary | ICD-10-CM | POA: Insufficient documentation

## 2014-09-15 DIAGNOSIS — N289 Disorder of kidney and ureter, unspecified: Secondary | ICD-10-CM | POA: Insufficient documentation

## 2014-09-15 MED ORDER — PERFLUTREN LIPID MICROSPHERE
1.0000 mL | Freq: Once | INTRAVENOUS | Status: AC
  Administered 2014-09-15: 1 mL via INTRAVENOUS

## 2014-09-22 ENCOUNTER — Encounter: Payer: Self-pay | Admitting: Cardiology

## 2014-09-22 ENCOUNTER — Encounter: Payer: Self-pay | Admitting: Internal Medicine

## 2014-09-22 NOTE — Telephone Encounter (Signed)
This encounter was created in error - please disregard.

## 2014-09-22 NOTE — Telephone Encounter (Signed)
Pt rn call to [place] from Friday-pls call [phone]

## 2014-09-22 NOTE — Telephone Encounter (Signed)
New problem ° ° °Pt returning your call. °

## 2014-11-10 ENCOUNTER — Other Ambulatory Visit: Payer: Self-pay | Admitting: Internal Medicine

## 2014-11-17 ENCOUNTER — Other Ambulatory Visit: Payer: Self-pay | Admitting: Internal Medicine

## 2014-11-18 ENCOUNTER — Other Ambulatory Visit: Payer: Self-pay

## 2014-11-18 MED ORDER — CARVEDILOL 25 MG PO TABS: 25.0000 mg | ORAL_TABLET | Freq: Two times a day (BID) | ORAL | Status: DC

## 2014-11-23 ENCOUNTER — Other Ambulatory Visit (INDEPENDENT_AMBULATORY_CARE_PROVIDER_SITE_OTHER): Payer: BLUE CROSS/BLUE SHIELD

## 2014-11-23 DIAGNOSIS — E119 Type 2 diabetes mellitus without complications: Secondary | ICD-10-CM

## 2014-11-23 DIAGNOSIS — Z0189 Encounter for other specified special examinations: Secondary | ICD-10-CM

## 2014-11-23 DIAGNOSIS — Z0000 Encounter for general adult medical examination without abnormal findings: Secondary | ICD-10-CM

## 2014-11-23 LAB — CBC WITH DIFFERENTIAL/PLATELET
Basophils Absolute: 0 10*3/uL (ref 0.0–0.1)
Basophils Relative: 0.4 % (ref 0.0–3.0)
Eosinophils Absolute: 0.2 10*3/uL (ref 0.0–0.7)
Eosinophils Relative: 2.4 % (ref 0.0–5.0)
HCT: 42.7 % (ref 39.0–52.0)
HEMOGLOBIN: 14.4 g/dL (ref 13.0–17.0)
Lymphocytes Relative: 24.8 % (ref 12.0–46.0)
Lymphs Abs: 1.6 10*3/uL (ref 0.7–4.0)
MCHC: 33.6 g/dL (ref 30.0–36.0)
MCV: 93.5 fl (ref 78.0–100.0)
Monocytes Absolute: 0.4 10*3/uL (ref 0.1–1.0)
Monocytes Relative: 5.9 % (ref 3.0–12.0)
Neutro Abs: 4.3 10*3/uL (ref 1.4–7.7)
Neutrophils Relative %: 66.5 % (ref 43.0–77.0)
Platelets: 219 10*3/uL (ref 150.0–400.0)
RBC: 4.57 Mil/uL (ref 4.22–5.81)
RDW: 13.8 % (ref 11.5–15.5)
WBC: 6.5 10*3/uL (ref 4.0–10.5)

## 2014-11-23 LAB — HEPATIC FUNCTION PANEL
ALK PHOS: 63 U/L (ref 39–117)
ALT: 7 U/L (ref 0–53)
AST: 14 U/L (ref 0–37)
Albumin: 4.1 g/dL (ref 3.5–5.2)
BILIRUBIN DIRECT: 0.1 mg/dL (ref 0.0–0.3)
BILIRUBIN TOTAL: 0.5 mg/dL (ref 0.2–1.2)
TOTAL PROTEIN: 7.2 g/dL (ref 6.0–8.3)

## 2014-11-23 LAB — LIPID PANEL
CHOL/HDL RATIO: 4
Cholesterol: 136 mg/dL (ref 0–200)
HDL: 38 mg/dL — ABNORMAL LOW (ref >39.00)
LDL Cholesterol: 72 mg/dL (ref 0–99)
NonHDL: 98.25
Triglycerides: 133 mg/dL (ref 0.0–149.0)
VLDL: 26.6 mg/dL (ref 0.0–40.0)

## 2014-11-23 LAB — BASIC METABOLIC PANEL
BUN: 13 mg/dL (ref 6–23)
CO2: 26 mEq/L (ref 19–32)
CREATININE: 1.17 mg/dL (ref 0.40–1.50)
Calcium: 9.3 mg/dL (ref 8.4–10.5)
Chloride: 107 mEq/L (ref 96–112)
GFR: 82.63 mL/min (ref >60.00)
GLUCOSE: 144 mg/dL — AB (ref 70–99)
POTASSIUM: 4.3 meq/L (ref 3.5–5.1)
Sodium: 142 mEq/L (ref 135–145)

## 2014-11-23 LAB — URINALYSIS, ROUTINE W REFLEX MICROSCOPIC
BILIRUBIN URINE: NEGATIVE
Hgb urine dipstick: NEGATIVE
KETONES UR: NEGATIVE
Leukocytes, UA: NEGATIVE
Nitrite: NEGATIVE
RBC / HPF: NONE SEEN (ref 0–?)
Specific Gravity, Urine: 1.025 (ref 1.000–1.030)
Total Protein, Urine: NEGATIVE
Urine Glucose: NEGATIVE
Urobilinogen, UA: 1 (ref 0.0–1.0)
pH: 6 (ref 5.0–8.0)

## 2014-11-23 LAB — MICROALBUMIN / CREATININE URINE RATIO
Creatinine,U: 240.5 mg/dL
Microalb Creat Ratio: 0.5 mg/g (ref 0.0–30.0)
Microalb, Ur: 1.2 mg/dL (ref 0.0–1.9)

## 2014-11-23 LAB — TSH: TSH: 1.08 u[IU]/mL (ref 0.35–4.50)

## 2014-11-23 LAB — PSA: PSA: 1.27 ng/mL (ref 0.10–4.00)

## 2014-11-23 LAB — HEMOGLOBIN A1C: Hgb A1c MFr Bld: 6.7 % — ABNORMAL HIGH (ref 4.6–6.5)

## 2014-11-24 ENCOUNTER — Other Ambulatory Visit: Payer: Self-pay | Admitting: Internal Medicine

## 2014-11-25 ENCOUNTER — Ambulatory Visit (INDEPENDENT_AMBULATORY_CARE_PROVIDER_SITE_OTHER): Payer: BLUE CROSS/BLUE SHIELD | Admitting: Internal Medicine

## 2014-11-25 VITALS — BP 122/82 | HR 90 | Temp 98.2°F | Ht 71.0 in | Wt 254.0 lb

## 2014-11-25 DIAGNOSIS — Z0000 Encounter for general adult medical examination without abnormal findings: Secondary | ICD-10-CM | POA: Diagnosis not present

## 2014-11-25 DIAGNOSIS — E119 Type 2 diabetes mellitus without complications: Secondary | ICD-10-CM

## 2014-11-25 MED ORDER — SIMVASTATIN 40 MG PO TABS: 40.0000 mg | ORAL_TABLET | Freq: Every day | ORAL | Status: DC

## 2014-11-25 MED ORDER — LISINOPRIL 20 MG PO TABS: 20.0000 mg | ORAL_TABLET | Freq: Every day | ORAL | Status: DC

## 2014-11-25 MED ORDER — METFORMIN HCL 500 MG PO TABS: 500.0000 mg | ORAL_TABLET | Freq: Every day | ORAL | Status: DC

## 2014-11-25 MED ORDER — ISOSORBIDE MONONITRATE ER 60 MG PO TB24: 60.0000 mg | ORAL_TABLET | Freq: Every day | ORAL | Status: DC

## 2014-11-25 MED ORDER — AMLODIPINE BESYLATE 5 MG PO TABS: 5.0000 mg | ORAL_TABLET | Freq: Every day | ORAL | Status: DC

## 2014-11-25 MED ORDER — HYDRALAZINE HCL 25 MG PO TABS: 25.0000 mg | ORAL_TABLET | Freq: Three times a day (TID) | ORAL | Status: DC

## 2014-11-25 MED ORDER — POTASSIUM CHLORIDE CRYS ER 20 MEQ PO TBCR: 20.0000 meq | EXTENDED_RELEASE_TABLET | Freq: Every day | ORAL | Status: DC

## 2014-11-25 MED ORDER — FUROSEMIDE 40 MG PO TABS: 40.0000 mg | ORAL_TABLET | Freq: Every day | ORAL | Status: DC

## 2014-11-25 MED ORDER — CARVEDILOL 25 MG PO TABS: 25.0000 mg | ORAL_TABLET | Freq: Two times a day (BID) | ORAL | Status: DC

## 2014-11-25 NOTE — Progress Notes (Signed)
Subjective:    Patient ID: Trevor Anderson, male    DOB: 1955/05/04, 59 y.o.   MRN: [id number]  HPI  Here for wellness and f/u;  Overall doing ok;  Pt denies Chest pain, worsening SOB, DOE, wheezing, orthopnea, PND, worsening LE edema, palpitations, dizziness or syncope.  Pt denies neurological change such as new headache, facial or extremity weakness.  Pt denies polydipsia, polyuria, or low sugar symptoms. Pt states overall good compliance with treatment and medications, good tolerability, and has been trying to follow appropriate diet.  Pt denies worsening depressive symptoms, suicidal ideation or panic. No fever, night sweats, wt loss, loss of appetite, or other constitutional symptoms.  Pt states good ability with ADL's, has low fall risk, home safety reviewed and adequate, no other significant changes in hearing or vision, and only occasionally active with exercise.  No current complaints Past Medical History  Diagnosis Date  . 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 CONGESTIVE HEART FAILURE 11/12/2007  . DIABETES MELLITUS, TYPE II 11/20/2008  . HYPERLIPIDEMIA 11/12/2007  . HYPERTENSION 11/12/2007  . TACHYCARDIA 01/24/2010   Past Surgical History  Procedure Laterality Date  . Tonsillectomy  1970    reports that he has never smoked. He does not have any smokeless tobacco history on file. He reports that he drinks alcohol. He reports that he does not use illicit drugs. family history includes Kidney disease in his mother. No Known Allergies Current Outpatient Prescriptions on File Prior to Visit  Medication Sig Dispense Refill  . aspirin 81 MG tablet Take 81 mg by mouth daily.      . Lancets MISC 1 application by Does not apply route daily. 100 each 11   No current facility-administered medications on file prior to visit.   Review of Systems Constitutional: Negative for increased diaphoresis, other activity, appetite or siginficant weight change other than noted HENT: Negative for  worsening hearing loss, ear pain, facial swelling, mouth sores and neck stiffness.   Eyes: Negative for other worsening pain, redness or visual disturbance.  Respiratory: Negative for shortness of breath and wheezing  Cardiovascular: Negative for chest pain and palpitations.  Gastrointestinal: Negative for diarrhea, blood in stool, abdominal distention or other pain Genitourinary: Negative for hematuria, flank pain or change in urine volume.  Musculoskeletal: Negative for myalgias or other joint complaints.  Skin: Negative for color change and wound or drainage.  Neurological: Negative for syncope and numbness. other than noted Hematological: Negative for adenopathy. or other swelling Psychiatric/Behavioral: Negative for hallucinations, SI, self-injury, decreased concentration or other worsening agitation.      Objective:   Physical Exam BP 122/82 mmHg  Pulse 90  Temp(Src) 98.2 F (36.8 C) (Oral)  Ht 5\' 11"  (1.803 m)  Wt 254 lb (115.214 kg)  BMI 35.44 kg/m2  SpO2 97% VS noted,  Constitutional: Pt is oriented to person, place, and time. Appears well-developed and well-nourished, in no significant distress Head: Normocephalic and atraumatic.  Right Ear: External ear normal.  Left Ear: External ear normal.  Nose: Nose normal.  Mouth/Throat: Oropharynx is clear and moist.  Eyes: Conjunctivae and EOM are normal. Pupils are equal, round, and reactive to light.  Neck: Normal range of motion. Neck supple. No JVD present. No tracheal deviation present or significant neck LA or mass Cardiovascular: Normal rate, regular rhythm, normal heart sounds and intact distal pulses.   Pulmonary/Chest: Effort normal and breath sounds without rales or wheezing  Abdominal: Soft. Bowel sounds are normal. NT. No HSM  Musculoskeletal: Normal range  of motion. Exhibits no edema.  Lymphadenopathy:  Has no cervical adenopathy.  Neurological: Pt is alert and oriented to person, place, and time. Pt has normal  reflexes. No cranial nerve deficit. Motor grossly intact Skin: Skin is warm and dry. No rash noted.  Psychiatric:  Has normal mood and affect. Behavior is normal.     Assessment & Plan:

## 2014-11-25 NOTE — Progress Notes (Signed)
Pre visit review using our clinic review tool, if applicable. No additional management support is needed unless otherwise documented below in the visit note. 

## 2014-11-25 NOTE — Patient Instructions (Signed)
Please continue all other medications as before, and refills have been done if requested.  Please have the pharmacy call with any other refills you may need.  Please continue your efforts at being more active, low cholesterol diet, and weight control.  You are otherwise up to date with prevention measures today.  Please keep your appointments with your specialists as you may have planned  Please return in 6 months, or sooner if needed, with Lab testing done 3-5 days before  

## 2015-05-25 ENCOUNTER — Other Ambulatory Visit: Payer: Self-pay | Admitting: Internal Medicine

## 2015-05-28 ENCOUNTER — Ambulatory Visit (INDEPENDENT_AMBULATORY_CARE_PROVIDER_SITE_OTHER): Payer: Managed Care, Other (non HMO) | Admitting: Internal Medicine

## 2015-05-28 ENCOUNTER — Other Ambulatory Visit (INDEPENDENT_AMBULATORY_CARE_PROVIDER_SITE_OTHER): Payer: Managed Care, Other (non HMO)

## 2015-05-28 ENCOUNTER — Encounter: Payer: Self-pay | Admitting: Internal Medicine

## 2015-05-28 VITALS — BP 140/84 | HR 83 | Temp 98.7°F | Resp 20 | Ht 71.0 in | Wt 259.8 lb

## 2015-05-28 DIAGNOSIS — E119 Type 2 diabetes mellitus without complications: Secondary | ICD-10-CM

## 2015-05-28 DIAGNOSIS — Z0000 Encounter for general adult medical examination without abnormal findings: Secondary | ICD-10-CM

## 2015-05-28 LAB — URINALYSIS, ROUTINE W REFLEX MICROSCOPIC
BILIRUBIN URINE: NEGATIVE
Hgb urine dipstick: NEGATIVE
Ketones, ur: NEGATIVE
Leukocytes, UA: NEGATIVE
Nitrite: NEGATIVE
PH: 6.5 (ref 5.0–8.0)
RBC / HPF: NONE SEEN (ref 0–?)
SPECIFIC GRAVITY, URINE: 1.015 (ref 1.000–1.030)
Total Protein, Urine: NEGATIVE
Urine Glucose: NEGATIVE
Urobilinogen, UA: 0.2 (ref 0.0–1.0)
WBC UA: NONE SEEN — AB (ref 0–?)

## 2015-05-28 LAB — LIPID PANEL
Cholesterol: 122 mg/dL (ref 0–200)
HDL: 34.4 mg/dL — AB (ref >39.00)
LDL Cholesterol: 73 mg/dL (ref 0–99)
NonHDL: 87.62
TRIGLYCERIDES: 74 mg/dL (ref 0.0–149.0)
Total CHOL/HDL Ratio: 4
VLDL: 14.8 mg/dL (ref 0.0–40.0)

## 2015-05-28 LAB — CBC WITH DIFFERENTIAL/PLATELET
BASOS PCT: 0.9 % (ref 0.0–3.0)
Basophils Absolute: 0.1 10*3/uL (ref 0.0–0.1)
EOS PCT: 2.9 % (ref 0.0–5.0)
Eosinophils Absolute: 0.2 10*3/uL (ref 0.0–0.7)
HEMATOCRIT: 43 % (ref 39.0–52.0)
Hemoglobin: 14 g/dL (ref 13.0–17.0)
LYMPHS ABS: 1.8 10*3/uL (ref 0.7–4.0)
Lymphocytes Relative: 25.6 % (ref 12.0–46.0)
MCHC: 32.6 g/dL (ref 30.0–36.0)
MCV: 94.3 fl (ref 78.0–100.0)
MONO ABS: 0.5 10*3/uL (ref 0.1–1.0)
MONOS PCT: 8 % (ref 3.0–12.0)
NEUTROS ABS: 4.3 10*3/uL (ref 1.4–7.7)
NEUTROS PCT: 62.6 % (ref 43.0–77.0)
PLATELETS: 254 10*3/uL (ref 150.0–400.0)
RBC: 4.56 Mil/uL (ref 4.22–5.81)
RDW: 13.7 % (ref 11.5–15.5)
WBC: 6.8 10*3/uL (ref 4.0–10.5)

## 2015-05-28 LAB — HEPATIC FUNCTION PANEL
ALBUMIN: 4.2 g/dL (ref 3.5–5.2)
ALK PHOS: 71 U/L (ref 39–117)
ALT: 9 U/L (ref 0–53)
AST: 16 U/L (ref 0–37)
Bilirubin, Direct: 0.1 mg/dL (ref 0.0–0.3)
TOTAL PROTEIN: 7.4 g/dL (ref 6.0–8.3)
Total Bilirubin: 0.4 mg/dL (ref 0.2–1.2)

## 2015-05-28 LAB — BASIC METABOLIC PANEL
BUN: 15 mg/dL (ref 6–23)
CALCIUM: 9.5 mg/dL (ref 8.4–10.5)
CO2: 29 meq/L (ref 19–32)
CREATININE: 1.22 mg/dL (ref 0.40–1.50)
Chloride: 110 mEq/L (ref 96–112)
GFR: 78.59 mL/min (ref >60.00)
GLUCOSE: 144 mg/dL — AB (ref 70–99)
Potassium: 4.6 mEq/L (ref 3.5–5.1)
Sodium: 147 mEq/L — ABNORMAL HIGH (ref 135–145)

## 2015-05-28 LAB — HEMOGLOBIN A1C: HEMOGLOBIN A1C: 7.2 % — AB (ref 4.6–6.5)

## 2015-05-28 LAB — PSA: PSA: 1.12 ng/mL (ref 0.10–4.00)

## 2015-05-28 LAB — TSH: TSH: 0.9 u[IU]/mL (ref 0.35–4.50)

## 2015-05-28 LAB — MICROALBUMIN / CREATININE URINE RATIO
Creatinine,U: 83 mg/dL
Microalb Creat Ratio: 5.5 mg/g (ref 0.0–30.0)
Microalb, Ur: 4.6 mg/dL — ABNORMAL HIGH (ref 0.0–1.9)

## 2015-05-28 NOTE — Patient Instructions (Signed)

## 2015-05-28 NOTE — Progress Notes (Signed)
Subjective:    Patient ID: Trevor Anderson, male    DOB: 04-26-1955, 60 y.o.   MRN: [id number]  HPI   Here for wellness and f/u;  Overall doing ok;  Pt denies Chest pain, worsening SOB, DOE, wheezing, orthopnea, PND, worsening LE edema, palpitations, dizziness or syncope.  Pt denies neurological change such as new headache, facial or extremity weakness.  Pt denies polydipsia, polyuria, or low sugar symptoms. Pt states overall good compliance with treatment and medications, good tolerability, and has been trying to follow appropriate diet.  Pt denies worsening depressive symptoms, suicidal ideation or panic. No fever, night sweats, wt loss, loss of appetite, or other constitutional symptoms.  Pt states good ability with ADL's, has low fall risk, home safety reviewed and adequate, no other significant changes in hearing or vision, and only occasionally active with exercise.  No complaints Past Medical History  Diagnosis Date  . 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 CONGESTIVE HEART FAILURE 11/12/2007  . DIABETES MELLITUS, TYPE II 11/20/2008  . HYPERLIPIDEMIA 11/12/2007  . HYPERTENSION 11/12/2007  . TACHYCARDIA 01/24/2010   Past Surgical History  Procedure Laterality Date  . Tonsillectomy  1970    reports that he has never smoked. He does not have any smokeless tobacco history on file. He reports that he drinks alcohol. He reports that he does not use illicit drugs. family history includes Kidney disease in his mother. No Known Allergies Current Outpatient Prescriptions on File Prior to Visit  Medication Sig Dispense Refill  . amLODipine (NORVASC) 5 MG tablet Take 1 tablet (5 mg total) by mouth daily. 90 tablet 3  . aspirin 81 MG tablet Take 81 mg by mouth daily.      . carvedilol (COREG) 25 MG tablet Take 1 tablet (25 mg total) by mouth 2 (two) times daily with a meal. 180 tablet 3  . furosemide (LASIX) 40 MG tablet Take 1 tablet (40 mg total) by mouth daily. 90 tablet 3  . hydrALAZINE  (APRESOLINE) 25 MG tablet Take 1 tablet (25 mg total) by mouth 3 (three) times daily. 270 tablet 3  . isosorbide mononitrate (IMDUR) 60 MG 24 hr tablet Take 1 tablet (60 mg total) by mouth daily. 90 tablet 3  . Lancets MISC 1 application by Does not apply route daily. 100 each 11  . lisinopril (PRINIVIL,ZESTRIL) 20 MG tablet Take 1 tablet (20 mg total) by mouth daily. 90 tablet 3  . metFORMIN (GLUCOPHAGE) 500 MG tablet Take 1 tablet (500 mg total) by mouth daily. 90 tablet 3  . potassium chloride SA (KLOR-CON M20) 20 MEQ tablet Take 1 tablet (20 mEq total) by mouth daily. 90 tablet 3  . simvastatin (ZOCOR) 40 MG tablet Take 1 tablet (40 mg total) by mouth daily with breakfast. 90 tablet 3   No current facility-administered medications on file prior to visit.   Review of Systems Constitutional: Negative for increased diaphoresis, other activity, appetite or siginficant weight change other than noted HENT: Negative for worsening hearing loss, ear pain, facial swelling, mouth sores and neck stiffness.   Eyes: Negative for other worsening pain, redness or visual disturbance.  Respiratory: Negative for shortness of breath and wheezing  Cardiovascular: Negative for chest pain and palpitations.  Gastrointestinal: Negative for diarrhea, blood in stool, abdominal distention or other pain Genitourinary: Negative for hematuria, flank pain or change in urine volume.  Musculoskeletal: Negative for myalgias or other joint complaints.  Skin: Negative for color change and wound or drainage.  Neurological: Negative for  syncope and numbness. other than noted Hematological: Negative for adenopathy. or other swelling Psychiatric/Behavioral: Negative for hallucinations, SI, self-injury, decreased concentration or other worsening agitation.      Objective:   Physical Exam BP 140/84 mmHg  Pulse 83  Temp(Src) 98.7 F (37.1 C) (Oral)  Resp 20  Ht  (1.803 m)  Wt 259 lb 12 oz (117.822 kg)  BMI 36.24  kg/m2  SpO2 96% VS noted,  Constitutional: Pt is oriented to person, place, and time. Appears well-developed and well-nourished, in no significant distress Head: Normocephalic and atraumatic.  Right Ear: External ear normal.  Left Ear: External ear normal.  Nose: Nose normal.  Mouth/Throat: Oropharynx is clear and moist.  Eyes: Conjunctivae and EOM are normal. Pupils are equal, round, and reactive to light.  Neck: Normal range of motion. Neck supple. No JVD present. No tracheal deviation present or significant neck LA or mass Cardiovascular: Normal rate, regular rhythm, normal heart sounds and intact distal pulses.   Pulmonary/Chest: Effort normal and breath sounds without rales or wheezing  Abdominal: Soft. Bowel sounds are normal. NT. No HSM  Musculoskeletal: Normal range of motion. Exhibits no edema.  Lymphadenopathy:  Has no cervical adenopathy.  Neurological: Pt is alert and oriented to person, place, and time. Pt has normal reflexes. No cranial nerve deficit. Motor grossly intact Skin: Skin is warm and dry. No rash noted.  Psychiatric:  Has normal mood and affect. Behavior is normal.      Assessment & Plan:

## 2015-05-28 NOTE — Progress Notes (Signed)
Pre visit review using our clinic review tool, if applicable. No additional management support is needed unless otherwise documented below in the visit note. 

## 2015-05-29 NOTE — Assessment & Plan Note (Signed)

## 2015-05-29 NOTE — Assessment & Plan Note (Signed)
stable overall by history and exam, recent data reviewed with pt, and pt to continue medical treatment as before,  to f/u any worsening symptoms or concerns Lab Results  Component Value Date   HGBA1C 7.2* 05/28/2015

## 2015-08-29 ENCOUNTER — Other Ambulatory Visit: Payer: Self-pay | Admitting: Internal Medicine

## 2015-10-26 LAB — HM DIABETES EYE EXAM

## 2015-11-26 ENCOUNTER — Encounter: Payer: Self-pay | Admitting: Internal Medicine

## 2015-11-26 ENCOUNTER — Ambulatory Visit (INDEPENDENT_AMBULATORY_CARE_PROVIDER_SITE_OTHER): Payer: Managed Care, Other (non HMO) | Admitting: Internal Medicine

## 2015-11-26 ENCOUNTER — Other Ambulatory Visit (INDEPENDENT_AMBULATORY_CARE_PROVIDER_SITE_OTHER): Payer: Managed Care, Other (non HMO)

## 2015-11-26 VITALS — BP 134/78 | HR 83 | Temp 98.0°F | Resp 16 | Ht 71.0 in | Wt 258.0 lb

## 2015-11-26 DIAGNOSIS — N289 Disorder of kidney and ureter, unspecified: Secondary | ICD-10-CM

## 2015-11-26 DIAGNOSIS — E785 Hyperlipidemia, unspecified: Secondary | ICD-10-CM

## 2015-11-26 DIAGNOSIS — E119 Type 2 diabetes mellitus without complications: Secondary | ICD-10-CM

## 2015-11-26 DIAGNOSIS — I10 Essential (primary) hypertension: Secondary | ICD-10-CM

## 2015-11-26 DIAGNOSIS — R6889 Other general symptoms and signs: Secondary | ICD-10-CM

## 2015-11-26 DIAGNOSIS — Z0001 Encounter for general adult medical examination with abnormal findings: Secondary | ICD-10-CM

## 2015-11-26 LAB — BASIC METABOLIC PANEL
BUN: 12 mg/dL (ref 6–23)
CHLORIDE: 107 meq/L (ref 96–112)
CO2: 28 meq/L (ref 19–32)
Calcium: 9 mg/dL (ref 8.4–10.5)
Creatinine, Ser: 1.12 mg/dL (ref 0.40–1.50)
GFR: 86.59 mL/min (ref >60.00)
Glucose, Bld: 137 mg/dL — ABNORMAL HIGH (ref 70–99)
POTASSIUM: 4.2 meq/L (ref 3.5–5.1)
SODIUM: 141 meq/L (ref 135–145)

## 2015-11-26 LAB — LIPID PANEL
Cholesterol: 118 mg/dL (ref 0–200)
HDL: 36.1 mg/dL — ABNORMAL LOW (ref >39.00)
LDL Cholesterol: 65 mg/dL (ref 0–99)
NONHDL: 82.16
Total CHOL/HDL Ratio: 3
Triglycerides: 85 mg/dL (ref 0.0–149.0)
VLDL: 17 mg/dL (ref 0.0–40.0)

## 2015-11-26 LAB — HEPATIC FUNCTION PANEL
ALT: 7 U/L (ref 0–53)
AST: 14 U/L (ref 0–37)
Albumin: 4.1 g/dL (ref 3.5–5.2)
Alkaline Phosphatase: 66 U/L (ref 39–117)
BILIRUBIN DIRECT: 0.1 mg/dL (ref 0.0–0.3)
BILIRUBIN TOTAL: 0.6 mg/dL (ref 0.2–1.2)
TOTAL PROTEIN: 7.4 g/dL (ref 6.0–8.3)

## 2015-11-26 LAB — HEMOGLOBIN A1C: HEMOGLOBIN A1C: 7 % — AB (ref 4.6–6.5)

## 2015-11-26 NOTE — Assessment & Plan Note (Signed)
stable overall by history and exam, recent data reviewed with pt, and pt to continue medical treatment as before,  to f/u any worsening symptoms or concerns Lab Results  Component Value Date   CREATININE 1.22 05/28/2015   For f/u lab, avoid nephrotoxins

## 2015-11-26 NOTE — Assessment & Plan Note (Signed)
stable overall by history and exam, recent data reviewed with pt, and pt to continue medical treatment as before,  to f/u any worsening symptoms or concerns Lab Results  Component Value Date   HGBA1C 7.2 (H) 05/28/2015   For f/u lab today

## 2015-11-26 NOTE — Patient Instructions (Signed)
Please continue all other medications as before, and refills have been done if requested.  Please have the pharmacy call with any other refills you may need.  Please continue your efforts at being more active, low cholesterol diet, and weight control.  You are otherwise up to date with prevention measures today.  Please keep your appointments with your specialists as you may have planned  Please return in 6 months, or sooner if needed, with Lab testing done 3-5 days before  

## 2015-11-26 NOTE — Assessment & Plan Note (Signed)
stable overall by history and exam, recent data reviewed with pt, and pt to continue medical treatment as before,  to f/u any worsening symptoms or concerns Lab Results  Component Value Date   LDLCALC 73 05/28/2015

## 2015-11-26 NOTE — Progress Notes (Signed)
Subjective:    Patient ID: Trevor Anderson, male    DOB: [birth year], 60 y.o.   MRN: [id number]  HPI  Here to f/u; overall doing ok,  Pt denies chest pain, increasing sob or doe, wheezing, orthopnea, PND, increased LE swelling, palpitations, dizziness or syncope.  Pt denies new neurological symptoms such as new headache, or facial or extremity weakness or numbness.  Pt denies polydipsia, polyuria, or low sugar episode.   Pt denies new neurological symptoms such as new headache, or facial or extremity weakness or numbness.   Pt states overall good compliance with meds, mostly trying to follow appropriate diet, with wt overall stable,  but little exercise however. Plans to try to be more active. Was not able to have today labs done prior to visit this time  No new complaints Wt Readings from Last 3 Encounters:  11/26/15 258 lb (117 kg)  05/28/15 259 lb 12 oz (117.8 kg)  11/25/14 254 lb (115.2 kg)   Past Medical History:  Diagnosis Date  . 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 CONGESTIVE HEART FAILURE 11/12/2007  . DIABETES MELLITUS, TYPE II 11/20/2008  . HYPERLIPIDEMIA 11/12/2007  . HYPERTENSION 11/12/2007  . TACHYCARDIA 01/24/2010   Past Surgical History:  Procedure Laterality Date  . TONSILLECTOMY  1970    reports that he has never smoked. He has never used smokeless tobacco. He reports that he drinks alcohol. He reports that he does not use drugs. family history includes Kidney disease in his mother. No Known Allergies Current Outpatient Prescriptions on File Prior to Visit  Medication Sig Dispense Refill  . amLODipine (NORVASC) 5 MG tablet Take 1 tablet (5 mg total) by mouth daily. 90 tablet 3  . aspirin 81 MG tablet Take 81 mg by mouth daily.      . carvedilol (COREG) 25 MG tablet Take 1 tablet (25 mg total) by mouth 2 (two) times daily with a meal. 180 tablet 3  . furosemide (LASIX) 40 MG tablet Take 1 tablet (40 mg total) by mouth daily. 90 tablet 3  . hydrALAZINE (APRESOLINE) 25 MG  tablet Take 1 tablet (25 mg total) by mouth 3 (three) times daily. 270 tablet 3  . isosorbide mononitrate (IMDUR) 60 MG 24 hr tablet Take 1 tablet (60 mg total) by mouth daily. 90 tablet 3  . Lancets MISC 1 application by Does not apply route daily. 100 each 11  . lisinopril (PRINIVIL,ZESTRIL) 20 MG tablet Take 1 tablet (20 mg total) by mouth daily. 90 tablet 3  . metFORMIN (GLUCOPHAGE) 500 MG tablet Take 1 tablet (500 mg total) by mouth daily. 90 tablet 3  . potassium chloride SA (KLOR-CON M20) 20 MEQ tablet Take 1 tablet (20 mEq total) by mouth daily. 90 tablet 3  . simvastatin (ZOCOR) 40 MG tablet Take 1 tablet (40 mg total) by mouth daily with breakfast. 90 tablet 3  . simvastatin (ZOCOR) 40 MG tablet TAKE ONE TABLET BY MOUTH AT BEDTIME 90 tablet 2   No current facility-administered medications on file prior to visit.     Review of Systems  Constitutional: Negative for unusual diaphoresis or night sweats HENT: Negative for ear swelling or discharge Eyes: Negative for worsening visual haziness  Respiratory: Negative for choking and stridor.   Gastrointestinal: Negative for distension or worsening eructation Genitourinary: Negative for retention or change in urine volume.  Musculoskeletal: Negative for other MSK pain or swelling Skin: Negative for color change and worsening wound Neurological: Negative for tremors and numbness other than  noted  Psychiatric/Behavioral: Negative for decreased concentration or agitation other than above       Objective:   Physical Exam BP 134/78   Pulse 83   Temp 98 F (36.7 C) (Oral)   Resp 16   Ht  (1.803 m)   Wt 258 lb (117 kg)   SpO2 97%   BMI 35.98 kg/m  VS noted,  Constitutional: Pt appears in no apparent distress HENT: Head: NCAT.  Right Ear: External ear normal.  Left Ear: External ear normal.  Eyes: . Pupils are equal, round, and reactive to light. Conjunctivae and EOM are normal Neck: Normal range of motion. Neck supple.    Cardiovascular: Normal rate and regular rhythm.   Pulmonary/Chest: Effort normal and breath sounds without rales or wheezing.  Abd:  Soft, NT, ND, + BS Neurological: Pt is alert. Not confused , motor grossly intact Skin: Skin is warm. No rash, no LE edema Psychiatric: Pt behavior is normal. No agitation.      Assessment & Plan:

## 2015-11-26 NOTE — Assessment & Plan Note (Signed)
stable overall by history and exam, recent data reviewed with pt, and pt to continue medical treatment as before,  to f/u any worsening symptoms or concerns BP Readings from Last 3 Encounters:  11/26/15 134/78  05/28/15 140/84  11/25/14 122/82   For fu lab today

## 2015-11-26 NOTE — Progress Notes (Signed)
Pre visit review using our clinic review tool, if applicable. No additional management support is needed unless otherwise documented below in the visit note. 

## 2015-12-18 ENCOUNTER — Other Ambulatory Visit: Payer: Self-pay | Admitting: Internal Medicine

## 2016-01-22 ENCOUNTER — Other Ambulatory Visit: Payer: Self-pay | Admitting: Internal Medicine

## 2016-02-02 ENCOUNTER — Other Ambulatory Visit: Payer: Self-pay | Admitting: Internal Medicine

## 2016-02-20 ENCOUNTER — Other Ambulatory Visit: Payer: Self-pay | Admitting: Internal Medicine

## 2016-05-20 ENCOUNTER — Other Ambulatory Visit: Payer: Self-pay | Admitting: Internal Medicine

## 2016-05-22 ENCOUNTER — Other Ambulatory Visit (INDEPENDENT_AMBULATORY_CARE_PROVIDER_SITE_OTHER): Payer: Managed Care, Other (non HMO)

## 2016-05-22 DIAGNOSIS — E119 Type 2 diabetes mellitus without complications: Secondary | ICD-10-CM | POA: Diagnosis not present

## 2016-05-22 DIAGNOSIS — Z0001 Encounter for general adult medical examination with abnormal findings: Secondary | ICD-10-CM

## 2016-05-22 LAB — URINALYSIS, ROUTINE W REFLEX MICROSCOPIC
BILIRUBIN URINE: NEGATIVE
HGB URINE DIPSTICK: NEGATIVE
LEUKOCYTES UA: NEGATIVE
Nitrite: NEGATIVE
PH: 6 (ref 5.0–8.0)
RBC / HPF: NONE SEEN (ref 0–?)
Specific Gravity, Urine: 1.02 (ref 1.000–1.030)
UROBILINOGEN UA: 1 (ref 0.0–1.0)
Urine Glucose: NEGATIVE

## 2016-05-22 LAB — LIPID PANEL
Cholesterol: 124 mg/dL (ref 0–200)
HDL: 32.8 mg/dL — AB (ref >39.00)
LDL Cholesterol: 61 mg/dL (ref 0–99)
NONHDL: 91.2
TRIGLYCERIDES: 153 mg/dL — AB (ref 0.0–149.0)
Total CHOL/HDL Ratio: 4
VLDL: 30.6 mg/dL (ref 0.0–40.0)

## 2016-05-22 LAB — HEPATIC FUNCTION PANEL
ALBUMIN: 4.1 g/dL (ref 3.5–5.2)
ALT: 7 U/L (ref 0–53)
AST: 12 U/L (ref 0–37)
Alkaline Phosphatase: 63 U/L (ref 39–117)
BILIRUBIN DIRECT: 0.1 mg/dL (ref 0.0–0.3)
TOTAL PROTEIN: 7.2 g/dL (ref 6.0–8.3)
Total Bilirubin: 0.5 mg/dL (ref 0.2–1.2)

## 2016-05-22 LAB — BASIC METABOLIC PANEL
BUN: 15 mg/dL (ref 6–23)
CALCIUM: 9.3 mg/dL (ref 8.4–10.5)
CO2: 29 mEq/L (ref 19–32)
CREATININE: 1.27 mg/dL (ref 0.40–1.50)
Chloride: 110 mEq/L (ref 96–112)
GFR: 74.78 mL/min (ref >60.00)
Glucose, Bld: 105 mg/dL — ABNORMAL HIGH (ref 70–99)
Potassium: 4.1 mEq/L (ref 3.5–5.1)
Sodium: 145 mEq/L (ref 135–145)

## 2016-05-22 LAB — CBC WITH DIFFERENTIAL/PLATELET
BASOS ABS: 0 10*3/uL (ref 0.0–0.1)
Basophils Relative: 0.7 % (ref 0.0–3.0)
EOS ABS: 0.1 10*3/uL (ref 0.0–0.7)
Eosinophils Relative: 1.7 % (ref 0.0–5.0)
HEMATOCRIT: 43 % (ref 39.0–52.0)
HEMOGLOBIN: 14 g/dL (ref 13.0–17.0)
LYMPHS PCT: 29 % (ref 12.0–46.0)
Lymphs Abs: 2 10*3/uL (ref 0.7–4.0)
MCHC: 32.6 g/dL (ref 30.0–36.0)
MCV: 94.7 fl (ref 78.0–100.0)
MONOS PCT: 6.2 % (ref 3.0–12.0)
Monocytes Absolute: 0.4 10*3/uL (ref 0.1–1.0)
Neutro Abs: 4.2 10*3/uL (ref 1.4–7.7)
Neutrophils Relative %: 62.4 % (ref 43.0–77.0)
Platelets: 255 10*3/uL (ref 150.0–400.0)
RBC: 4.54 Mil/uL (ref 4.22–5.81)
RDW: 13.6 % (ref 11.5–15.5)
WBC: 6.8 10*3/uL (ref 4.0–10.5)

## 2016-05-22 LAB — HEMOGLOBIN A1C: HEMOGLOBIN A1C: 7.2 % — AB (ref 4.6–6.5)

## 2016-05-22 LAB — MICROALBUMIN / CREATININE URINE RATIO
Creatinine,U: 464.3 mg/dL
MICROALB/CREAT RATIO: 0.9 mg/g (ref 0.0–30.0)
Microalb, Ur: 4.4 mg/dL — ABNORMAL HIGH (ref 0.0–1.9)

## 2016-05-23 LAB — TSH: TSH: 1.02 u[IU]/mL (ref 0.35–4.50)

## 2016-05-23 LAB — PSA: PSA: 1.2 ng/mL (ref 0.10–4.00)

## 2016-05-26 ENCOUNTER — Encounter: Payer: Self-pay | Admitting: Internal Medicine

## 2016-05-26 ENCOUNTER — Ambulatory Visit (INDEPENDENT_AMBULATORY_CARE_PROVIDER_SITE_OTHER): Payer: Managed Care, Other (non HMO) | Admitting: Internal Medicine

## 2016-05-26 VITALS — BP 132/84 | HR 87 | Resp 20 | Wt 258.1 lb

## 2016-05-26 DIAGNOSIS — Z0000 Encounter for general adult medical examination without abnormal findings: Secondary | ICD-10-CM

## 2016-05-26 DIAGNOSIS — Z1159 Encounter for screening for other viral diseases: Secondary | ICD-10-CM | POA: Diagnosis not present

## 2016-05-26 DIAGNOSIS — E119 Type 2 diabetes mellitus without complications: Secondary | ICD-10-CM

## 2016-05-26 DIAGNOSIS — I10 Essential (primary) hypertension: Secondary | ICD-10-CM | POA: Diagnosis not present

## 2016-05-26 NOTE — Progress Notes (Signed)
Subjective:    Patient ID: Trevor Anderson, male    DOB: 03/11/1956, 61 y.o.   MRN: [id number]  HPI  Here for wellness and f/u;  Overall doing ok;  Pt denies Chest pain, worsening SOB, DOE, wheezing, orthopnea, PND, worsening LE edema, palpitations, dizziness or syncope.  Pt denies neurological change such as new headache, facial or extremity weakness.  Pt denies polydipsia, polyuria, or low sugar symptoms. Pt states overall good compliance with treatment and medications, good tolerability, and has been trying to follow appropriate diet.  Pt denies worsening depressive symptoms, suicidal ideation or panic. No fever, night sweats, wt loss, loss of appetite, or other constitutional symptoms.  Pt states good ability with ADL's, has low fall risk, home safety reviewed and adequate, no other significant changes in hearing or vision, and only occasionally active with exercise. Wt Readings from Last 3 Encounters:  05/26/16 258 lb 2 oz (117.1 kg)  11/26/15 258 lb (117 kg)  05/28/15 259 lb 12 oz (117.8 kg)  Declines flu shot and colonoscopy.  No other new hx Past Medical History:  Diagnosis Date  . 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 CONGESTIVE HEART FAILURE 11/12/2007  . DIABETES MELLITUS, TYPE II 11/20/2008  . HYPERLIPIDEMIA 11/12/2007  . HYPERTENSION 11/12/2007  . TACHYCARDIA 01/24/2010   Past Surgical History:  Procedure Laterality Date  . TONSILLECTOMY  1970    reports that he has never smoked. He has never used smokeless tobacco. He reports that he drinks alcohol. He reports that he does not use drugs. family history includes Kidney disease in his mother. No Known Allergies Current Outpatient Prescriptions on File Prior to Visit  Medication Sig Dispense Refill  . amLODipine (NORVASC) 5 MG tablet Take 1 tablet (5 mg total) by mouth daily. 90 tablet 3  . amLODipine (NORVASC) 5 MG tablet TAKE ONE TABLET BY MOUTH ONCE DAILY 90 tablet 1  . aspirin 81 MG tablet Take 81 mg by mouth daily.      .  carvedilol (COREG) 25 MG tablet TAKE ONE TABLET BY MOUTH TWICE DAILY WITH A MEAL 60 tablet 11  . furosemide (LASIX) 40 MG tablet TAKE ONE TABLET BY MOUTH ONCE DAILY 90 tablet 3  . hydrALAZINE (APRESOLINE) 25 MG tablet TAKE ONE TABLET BY MOUTH THREE TIMES DAILY 270 tablet 1  . isosorbide mononitrate (IMDUR) 60 MG 24 hr tablet TAKE ONE TABLET BY MOUTH ONCE DAILY 90 tablet 3  . KLOR-CON M20 20 MEQ tablet TAKE ONE TABLET BY MOUTH ONCE DAILY 90 tablet 3  . Lancets MISC 1 application by Does not apply route daily. 100 each 11  . lisinopril (PRINIVIL,ZESTRIL) 20 MG tablet TAKE ONE TABLET BY MOUTH ONCE DAILY 90 tablet 1  . metFORMIN (GLUCOPHAGE) 500 MG tablet TAKE ONE TABLET BY MOUTH ONCE DAILY 90 tablet 3  . simvastatin (ZOCOR) 40 MG tablet Take 1 tablet (40 mg total) by mouth daily with breakfast. 90 tablet 3  . simvastatin (ZOCOR) 40 MG tablet Take 1 tablet (40 mg total) by mouth at bedtime. Yearly physical w/labs due in February must see MD for refills 30 tablet 0   No current facility-administered medications on file prior to visit.    Review of Systems Constitutional: Negative for increased diaphoresis, or other activity, appetite or siginficant weight change other than noted HENT: Negative for worsening hearing loss, ear pain, facial swelling, mouth sores and neck stiffness.   Eyes: Negative for other worsening pain, redness or visual disturbance.  Respiratory: Negative for choking or  stridor Cardiovascular: Negative for other chest pain and palpitations.  Gastrointestinal: Negative for worsening diarrhea, blood in stool, or abdominal distention Genitourinary: Negative for hematuria, flank pain or change in urine volume.  Musculoskeletal: Negative for myalgias or other joint complaints.  Skin: Negative for other color change and wound or drainage.  Neurological: Negative for syncope and numbness. other than noted Hematological: Negative for adenopathy. or other  swelling Psychiatric/Behavioral: Negative for hallucinations, SI, self-injury, decreased concentration or other worsening agitation.  All other system neg per pt    Objective:   Physical Exam BP 132/84   Pulse 87   Resp 20   Wt 258 lb 2 oz (117.1 kg)   SpO2 97%   BMI 36.00 kg/m  VS noted,  Constitutional: Pt is oriented to person, place, and time. Appears well-developed and well-nourished, in no significant distress Head: Normocephalic and atraumatic  Eyes: Conjunctivae and EOM are normal. Pupils are equal, round, and reactive to light Right Ear: External ear normal.  Left Ear: External ear normal Nose: Nose normal.  Mouth/Throat: Oropharynx is clear and moist  Neck: Normal range of motion. Neck supple. No JVD present. No tracheal deviation present or significant neck LA or mass Cardiovascular: Normal rate, regular rhythm, normal heart sounds and intact distal pulses.   Pulmonary/Chest: Effort normal and breath sounds without rales or wheezing  Abdominal: Soft. Bowel sounds are normal. NT. No HSM  Musculoskeletal: Normal range of motion. Exhibits no edema Lymphadenopathy: Has no cervical adenopathy.  Neurological: Pt is alert and oriented to person, place, and time. Pt has normal reflexes. No cranial nerve deficit. Motor grossly intact Skin: Skin is warm and dry. No rash noted or new ulcers Psychiatric:  Has normal mood and affect. Behavior is normal.  No other new exam findings    Assessment & Plan:

## 2016-05-26 NOTE — Assessment & Plan Note (Signed)

## 2016-05-26 NOTE — Assessment & Plan Note (Signed)
stable overall by history and exam, recent data reviewed with pt, and pt to continue medical treatment as before,  to f/u any worsening symptoms or concerns Lab Results  Component Value Date   HGBA1C 7.2 (H) 05/22/2016

## 2016-05-26 NOTE — Patient Instructions (Signed)
Please continue all other medications as before, and refills have been done if requested.  Please have the pharmacy call with any other refills you may need.  Please continue your efforts at being more active, low cholesterol diet, and weight control.  You are otherwise up to date with prevention measures today.  Please keep your appointments with your specialists as you may have planned  Please return in 6 months, or sooner if needed, with Lab testing done 3-5 days before  

## 2016-05-26 NOTE — Progress Notes (Signed)
Pre visit review using our clinic review tool, if applicable. No additional management support is needed unless otherwise documented below in the visit note. 

## 2016-05-26 NOTE — Assessment & Plan Note (Signed)
stable overall by history and exam, recent data reviewed with pt, and pt to continue medical treatment as before,  to f/u any worsening symptoms or concerns BP Readings from Last 3 Encounters:  05/26/16 132/84  11/26/15 134/78  05/28/15 140/84

## 2016-06-24 ENCOUNTER — Other Ambulatory Visit: Payer: Self-pay | Admitting: Internal Medicine

## 2016-07-23 ENCOUNTER — Other Ambulatory Visit: Payer: Self-pay | Admitting: Internal Medicine

## 2016-08-10 ENCOUNTER — Other Ambulatory Visit: Payer: Self-pay | Admitting: Internal Medicine

## 2016-08-17 ENCOUNTER — Other Ambulatory Visit: Payer: Self-pay | Admitting: Internal Medicine

## 2016-11-01 ENCOUNTER — Other Ambulatory Visit (INDEPENDENT_AMBULATORY_CARE_PROVIDER_SITE_OTHER): Payer: Managed Care, Other (non HMO)

## 2016-11-01 DIAGNOSIS — E119 Type 2 diabetes mellitus without complications: Secondary | ICD-10-CM | POA: Diagnosis not present

## 2016-11-01 DIAGNOSIS — Z1159 Encounter for screening for other viral diseases: Secondary | ICD-10-CM

## 2016-11-01 LAB — BASIC METABOLIC PANEL
BUN: 12 mg/dL (ref 6–23)
CALCIUM: 9.4 mg/dL (ref 8.4–10.5)
CO2: 29 mEq/L (ref 19–32)
Chloride: 108 mEq/L (ref 96–112)
Creatinine, Ser: 1.15 mg/dL (ref 0.40–1.50)
GFR: 83.72 mL/min (ref >60.00)
GLUCOSE: 124 mg/dL — AB (ref 70–99)
Potassium: 4 mEq/L (ref 3.5–5.1)
Sodium: 144 mEq/L (ref 135–145)

## 2016-11-01 LAB — LIPID PANEL
CHOLESTEROL: 124 mg/dL (ref 0–200)
HDL: 36.1 mg/dL — ABNORMAL LOW (ref >39.00)
LDL CALC: 61 mg/dL (ref 0–99)
NonHDL: 87.78
TRIGLYCERIDES: 132 mg/dL (ref 0.0–149.0)
Total CHOL/HDL Ratio: 3
VLDL: 26.4 mg/dL (ref 0.0–40.0)

## 2016-11-01 LAB — HEPATIC FUNCTION PANEL
ALBUMIN: 3.9 g/dL (ref 3.5–5.2)
ALT: 6 U/L (ref 0–53)
AST: 11 U/L (ref 0–37)
Alkaline Phosphatase: 68 U/L (ref 39–117)
Bilirubin, Direct: 0.1 mg/dL (ref 0.0–0.3)
Total Bilirubin: 0.3 mg/dL (ref 0.2–1.2)
Total Protein: 7.2 g/dL (ref 6.0–8.3)

## 2016-11-01 LAB — HEMOGLOBIN A1C: Hgb A1c MFr Bld: 7.4 % — ABNORMAL HIGH (ref 4.6–6.5)

## 2016-11-02 ENCOUNTER — Encounter: Payer: Self-pay | Admitting: Internal Medicine

## 2016-11-02 ENCOUNTER — Ambulatory Visit (INDEPENDENT_AMBULATORY_CARE_PROVIDER_SITE_OTHER): Payer: Managed Care, Other (non HMO) | Admitting: Internal Medicine

## 2016-11-02 VITALS — BP 120/82 | HR 79 | Ht 71.0 in | Wt 260.0 lb

## 2016-11-02 DIAGNOSIS — N289 Disorder of kidney and ureter, unspecified: Secondary | ICD-10-CM

## 2016-11-02 DIAGNOSIS — I10 Essential (primary) hypertension: Secondary | ICD-10-CM | POA: Diagnosis not present

## 2016-11-02 DIAGNOSIS — E119 Type 2 diabetes mellitus without complications: Secondary | ICD-10-CM

## 2016-11-02 DIAGNOSIS — Z0001 Encounter for general adult medical examination with abnormal findings: Secondary | ICD-10-CM | POA: Diagnosis not present

## 2016-11-02 DIAGNOSIS — E785 Hyperlipidemia, unspecified: Secondary | ICD-10-CM

## 2016-11-02 LAB — HEPATITIS C ANTIBODY: HCV Ab: NEGATIVE

## 2016-11-02 NOTE — Progress Notes (Signed)
Subjective:    Patient ID: Trevor Anderson, male    DOB: [birth year], 61 y.o.   MRN: [id number]  HPI  Here to f/u; overall doing ok,  Pt denies chest pain, increasing sob or doe, wheezing, orthopnea, PND, increased LE swelling, palpitations, dizziness or syncope.  Pt denies new neurological symptoms such as new headache, or facial or extremity weakness or numbness.  Pt denies polydipsia, polyuria, or low sugar episode.  Pt states overall good compliance with meds, mostly trying to follow appropriate diet, with wt overall stable,  but little exercise however.  Wt Readings from Last 3 Encounters:  11/02/16 260 lb (117.9 kg)  05/26/16 258 lb 2 oz (117.1 kg)  11/26/15 258 lb (117 kg)  Plans to retire in 1 month with pension from [employer], then plans to be more active and better det and joint gym.  No new complaints Past Medical History:  Diagnosis Date  . 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 CONGESTIVE HEART FAILURE 11/12/2007  . DIABETES MELLITUS, TYPE II 11/20/2008  . HYPERLIPIDEMIA 11/12/2007  . HYPERTENSION 11/12/2007  . TACHYCARDIA 01/24/2010   Past Surgical History:  Procedure Laterality Date  . TONSILLECTOMY  1970    reports that he has never smoked. He has never used smokeless tobacco. He reports that he drinks alcohol. He reports that he does not use drugs. family history includes Kidney disease in his mother. No Known Allergies Current Outpatient Prescriptions on File Prior to Visit  Medication Sig Dispense Refill  . amLODipine (NORVASC) 5 MG tablet Take 1 tablet (5 mg total) by mouth daily. 90 tablet 2  . aspirin 81 MG tablet Take 81 mg by mouth daily.      . carvedilol (COREG) 25 MG tablet TAKE ONE TABLET BY MOUTH TWICE DAILY WITH A MEAL 60 tablet 11  . furosemide (LASIX) 40 MG tablet TAKE ONE TABLET BY MOUTH ONCE DAILY 90 tablet 3  . hydrALAZINE (APRESOLINE) 25 MG tablet TAKE ONE TABLET BY MOUTH THREE TIMES DAILY 270 tablet 1  . isosorbide mononitrate (IMDUR) 60 MG 24 hr tablet  TAKE ONE TABLET BY MOUTH ONCE DAILY 90 tablet 3  . KLOR-CON M20 20 MEQ tablet TAKE ONE TABLET BY MOUTH ONCE DAILY 90 tablet 3  . Lancets MISC 1 application by Does not apply route daily. 100 each 11  . lisinopril (PRINIVIL,ZESTRIL) 20 MG tablet TAKE ONE TABLET BY MOUTH ONCE DAILY 90 tablet 3  . metFORMIN (GLUCOPHAGE) 500 MG tablet TAKE ONE TABLET BY MOUTH ONCE DAILY 90 tablet 3  . simvastatin (ZOCOR) 40 MG tablet Take 1 tablet (40 mg total) by mouth at bedtime. 90 tablet 2   No current facility-administered medications on file prior to visit.    Review of Systems  Constitutional: Negative for other unusual diaphoresis or sweats HENT: Negative for ear discharge or swelling Eyes: Negative for other worsening visual disturbances Respiratory: Negative for stridor or other swelling  Gastrointestinal: Negative for worsening distension or other blood Genitourinary: Negative for retention or other urinary change Musculoskeletal: Negative for other MSK pain or swelling Skin: Negative for color change or other new lesions Neurological: Negative for worsening tremors and other numbness  Psychiatric/Behavioral: Negative for worsening agitation or other fatigue All other system neg per pt Objective:   Physical Exam BP 120/82   Pulse 79   Ht 5\' 11"  (1.803 m)   Wt 260 lb (117.9 kg)   SpO2 98%   BMI 36.26 kg/m  VS noted,  Constitutional: Pt appears in NAD  HENT: Head: NCAT.  Right Ear: External ear normal.  Left Ear: External ear normal.  Eyes: . Pupils are equal, round, and reactive to light. Conjunctivae and EOM are normal Nose: without d/c or deformity Neck: Neck supple. Gross normal ROM Cardiovascular: Normal rate and regular rhythm.   Pulmonary/Chest: Effort normal and breath sounds without rales or wheezing.  Abd:  Soft, NT, ND, + BS, no organomegaly Neurological: Pt is alert. At baseline orientation, motor grossly intact Skin: Skin is warm. No rashes, other new lesions, no LE  edema Psychiatric: Pt behavior is normal without agitation  No other exam findings Lab Results  Component Value Date   WBC 6.8 05/22/2016   HGB 14.0 05/22/2016   HCT 43.0 05/22/2016   PLT 255.0 05/22/2016   GLUCOSE 124 (H) 11/01/2016   CHOL 124 11/01/2016   TRIG 132.0 11/01/2016   HDL 36.10 (L) 11/01/2016   LDLCALC 61 11/01/2016   ALT 6 11/01/2016   AST 11 11/01/2016   NA 144 11/01/2016   K 4.0 11/01/2016   CL 108 11/01/2016   CREATININE 1.15 11/01/2016   BUN 12 11/01/2016   CO2 29 11/01/2016   TSH 1.02 05/22/2016   PSA 1.20 05/22/2016   INR 1.2 08/21/2007   HGBA1C 7.4 (H) 11/01/2016   MICROALBUR 4.4 (H) 05/22/2016       Assessment & Plan:

## 2016-11-02 NOTE — Patient Instructions (Signed)
Please continue all other medications as before, and refills have been done if requested.  Please have the pharmacy call with any other refills you may need.  Please continue your efforts at being more active, low cholesterol diabeteic diet, and weight control  Please keep your appointments with your specialists as you may have planned  Please return in 6 months, or sooner if needed, with Lab testing done 3-5 days before

## 2016-11-02 NOTE — Assessment & Plan Note (Signed)
Mild worsening, d/w pt regarding increased med tx, but he is adamant he will be able to effect lifestyle change to improve his a1c. O/w stable overall by history and exam, recent data reviewed with pt, and pt to continue medical treatment as before,  to f/u any worsening symptoms or concerns Lab Results  Component Value Date   HGBA1C 7.4 (H) 11/01/2016

## 2016-11-02 NOTE — Assessment & Plan Note (Signed)
stable overall by history and exam, recent data reviewed with pt, and pt to continue medical treatment as before,  to f/u any worsening symptoms or concerns Lab Results  Component Value Date   LDLCALC 61 11/01/2016

## 2016-11-02 NOTE — Assessment & Plan Note (Signed)
stable overall by history and exam, recent data reviewed with pt, and pt to continue medical treatment as before,  to f/u any worsening symptoms or concerns BP Readings from Last 3 Encounters:  11/02/16 120/82  05/26/16 132/84  11/26/15 134/78

## 2016-11-02 NOTE — Assessment & Plan Note (Signed)
Lab Results  Component Value Date   CREATININE 1.15 11/01/2016  stable overall by history and exam, recent data reviewed with pt, and pt to continue medical treatment as before,  to f/u any worsening symptoms or concerns

## 2016-11-23 ENCOUNTER — Ambulatory Visit: Payer: Managed Care, Other (non HMO) | Admitting: Internal Medicine

## 2016-12-10 ENCOUNTER — Other Ambulatory Visit: Payer: Self-pay | Admitting: Internal Medicine

## 2017-01-21 ENCOUNTER — Other Ambulatory Visit: Payer: Self-pay | Admitting: Internal Medicine

## 2017-01-28 ENCOUNTER — Other Ambulatory Visit: Payer: Self-pay | Admitting: Internal Medicine

## 2017-02-10 ENCOUNTER — Other Ambulatory Visit: Payer: Self-pay | Admitting: Internal Medicine

## 2017-03-25 ENCOUNTER — Other Ambulatory Visit: Payer: Self-pay | Admitting: Internal Medicine

## 2017-05-03 ENCOUNTER — Other Ambulatory Visit (INDEPENDENT_AMBULATORY_CARE_PROVIDER_SITE_OTHER): Payer: Self-pay

## 2017-05-03 DIAGNOSIS — E119 Type 2 diabetes mellitus without complications: Secondary | ICD-10-CM

## 2017-05-03 DIAGNOSIS — Z0001 Encounter for general adult medical examination with abnormal findings: Secondary | ICD-10-CM

## 2017-05-03 LAB — HEPATIC FUNCTION PANEL
ALK PHOS: 68 U/L (ref 39–117)
ALT: 5 U/L (ref 0–53)
AST: 10 U/L (ref 0–37)
Albumin: 4.2 g/dL (ref 3.5–5.2)
BILIRUBIN DIRECT: 0.1 mg/dL (ref 0.0–0.3)
TOTAL PROTEIN: 7.3 g/dL (ref 6.0–8.3)
Total Bilirubin: 0.5 mg/dL (ref 0.2–1.2)

## 2017-05-03 LAB — URINALYSIS, ROUTINE W REFLEX MICROSCOPIC
HGB URINE DIPSTICK: NEGATIVE
Ketones, ur: NEGATIVE
Leukocytes, UA: NEGATIVE
Nitrite: NEGATIVE
RBC / HPF: NONE SEEN (ref 0–?)
Specific Gravity, Urine: 1.025 (ref 1.000–1.030)
Total Protein, Urine: 30 — AB
Urine Glucose: NEGATIVE
Urobilinogen, UA: 0.2 (ref 0.0–1.0)
pH: 6 (ref 5.0–8.0)

## 2017-05-03 LAB — CBC WITH DIFFERENTIAL/PLATELET
BASOS ABS: 0.1 10*3/uL (ref 0.0–0.1)
Basophils Relative: 1 % (ref 0.0–3.0)
EOS ABS: 0.2 10*3/uL (ref 0.0–0.7)
Eosinophils Relative: 2.7 % (ref 0.0–5.0)
HEMATOCRIT: 43.1 % (ref 39.0–52.0)
HEMOGLOBIN: 14.1 g/dL (ref 13.0–17.0)
LYMPHS PCT: 32.4 % (ref 12.0–46.0)
Lymphs Abs: 2.1 10*3/uL (ref 0.7–4.0)
MCHC: 32.7 g/dL (ref 30.0–36.0)
MCV: 95 fl (ref 78.0–100.0)
MONOS PCT: 7.7 % (ref 3.0–12.0)
Monocytes Absolute: 0.5 10*3/uL (ref 0.1–1.0)
NEUTROS ABS: 3.7 10*3/uL (ref 1.4–7.7)
Neutrophils Relative %: 56.2 % (ref 43.0–77.0)
PLATELETS: 242 10*3/uL (ref 150.0–400.0)
RBC: 4.54 Mil/uL (ref 4.22–5.81)
RDW: 13.7 % (ref 11.5–15.5)
WBC: 6.5 10*3/uL (ref 4.0–10.5)

## 2017-05-03 LAB — BASIC METABOLIC PANEL
BUN: 12 mg/dL (ref 6–23)
CALCIUM: 8.9 mg/dL (ref 8.4–10.5)
CHLORIDE: 107 meq/L (ref 96–112)
CO2: 27 mEq/L (ref 19–32)
CREATININE: 1.19 mg/dL (ref 0.40–1.50)
GFR: 80.34 mL/min (ref >60.00)
Glucose, Bld: 142 mg/dL — ABNORMAL HIGH (ref 70–99)
Potassium: 4.1 mEq/L (ref 3.5–5.1)
Sodium: 142 mEq/L (ref 135–145)

## 2017-05-03 LAB — HEMOGLOBIN A1C: Hgb A1c MFr Bld: 7.2 % — ABNORMAL HIGH (ref 4.6–6.5)

## 2017-05-03 LAB — LIPID PANEL
CHOL/HDL RATIO: 4
Cholesterol: 127 mg/dL (ref 0–200)
HDL: 33 mg/dL — AB (ref >39.00)
LDL Cholesterol: 69 mg/dL (ref 0–99)
NonHDL: 94.26
TRIGLYCERIDES: 126 mg/dL (ref 0.0–149.0)
VLDL: 25.2 mg/dL (ref 0.0–40.0)

## 2017-05-03 LAB — PSA: PSA: 1.32 ng/mL (ref 0.10–4.00)

## 2017-05-03 LAB — MICROALBUMIN / CREATININE URINE RATIO
CREATININE, U: 286.1 mg/dL
MICROALB/CREAT RATIO: 6.7 mg/g (ref 0.0–30.0)
Microalb, Ur: 19.3 mg/dL — ABNORMAL HIGH (ref 0.0–1.9)

## 2017-05-03 LAB — TSH: TSH: 1.93 u[IU]/mL (ref 0.35–4.50)

## 2017-05-04 ENCOUNTER — Encounter: Payer: Self-pay | Admitting: Internal Medicine

## 2017-05-04 ENCOUNTER — Ambulatory Visit (INDEPENDENT_AMBULATORY_CARE_PROVIDER_SITE_OTHER): Payer: Self-pay | Admitting: Internal Medicine

## 2017-05-04 VITALS — BP 114/80 | HR 78 | Temp 98.4°F | Ht 71.0 in | Wt 258.0 lb

## 2017-05-04 DIAGNOSIS — E119 Type 2 diabetes mellitus without complications: Secondary | ICD-10-CM

## 2017-05-04 DIAGNOSIS — Z0000 Encounter for general adult medical examination without abnormal findings: Secondary | ICD-10-CM

## 2017-05-04 NOTE — Progress Notes (Signed)
Subjective:    Patient ID: Trevor Anderson, male    DOB: [birth year], 62 y.o.   MRN: [id number]  HPI  Here for wellness and f/u;  Overall doing ok;  Pt denies Chest pain, worsening SOB, DOE, wheezing, orthopnea, PND, worsening LE edema, palpitations, dizziness or syncope.  Pt denies neurological change such as new headache, facial or extremity weakness.  Pt denies polydipsia, polyuria, or low sugar symptoms. Pt states overall good compliance with treatment and medications, good tolerability, and has been trying to follow appropriate diet.  Pt denies worsening depressive symptoms, suicidal ideation or panic. No fever, night sweats, wt loss, loss of appetite, or other constitutional symptoms.  Pt states good ability with ADL's, has low fall risk, home safety reviewed and adequate, no other significant changes in hearing or vision, and only occasionally active with exercise.  Declines flu shot, declines colonboscopy Wt Readings from Last 3 Encounters:  05/04/17 258 lb (117 kg)  11/02/16 260 lb (117.9 kg)  05/26/16 258 lb 2 oz (117.1 kg)   Past Medical History:  Diagnosis Date  . 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 CONGESTIVE HEART FAILURE 11/12/2007  . DIABETES MELLITUS, TYPE II 11/20/2008  . HYPERLIPIDEMIA 11/12/2007  . HYPERTENSION 11/12/2007  . TACHYCARDIA 01/24/2010   Past Surgical History:  Procedure Laterality Date  . TONSILLECTOMY  1970    reports that  has never smoked. he has never used smokeless tobacco. He reports that he drinks alcohol. He reports that he does not use drugs. family history includes Kidney disease in his mother. No Known Allergies Current Outpatient Medications on File Prior to Visit  Medication Sig Dispense Refill  . amLODipine (NORVASC) 5 MG tablet Take 1 tablet (5 mg total) by mouth daily. 90 tablet 2  . aspirin 81 MG tablet Take 81 mg by mouth daily.      . carvedilol (COREG) 25 MG tablet TAKE ONE TABLET BY MOUTH TWICE DAILY WITH MEALS 60 tablet 5  .  furosemide (LASIX) 40 MG tablet TAKE ONE TABLET BY MOUTH ONCE DAILY 90 tablet 1  . hydrALAZINE (APRESOLINE) 25 MG tablet TAKE 1 TABLET BY MOUTH THREE TIMES DAILY 270 tablet 1  . isosorbide mononitrate (IMDUR) 60 MG 24 hr tablet TAKE ONE TABLET BY MOUTH ONCE DAILY 90 tablet 1  . KLOR-CON M20 20 MEQ tablet TAKE ONE TABLET BY MOUTH ONCE DAILY 90 tablet 1  . Lancets MISC 1 application by Does not apply route daily. 100 each 11  . lisinopril (PRINIVIL,ZESTRIL) 20 MG tablet TAKE ONE TABLET BY MOUTH ONCE DAILY 90 tablet 3  . metFORMIN (GLUCOPHAGE) 500 MG tablet TAKE ONE TABLET BY MOUTH ONCE DAILY 90 tablet 1  . simvastatin (ZOCOR) 40 MG tablet Take 1 tablet (40 mg total) by mouth at bedtime. **PT NEEDS PHYSICAL FOR ADDITIONAL REFILLS** 90 tablet 2   No current facility-administered medications on file prior to visit.    Review of Systems Constitutional: Negative for other unusual diaphoresis, sweats, appetite or weight changes HENT: Negative for other worsening hearing loss, ear pain, facial swelling, mouth sores or neck stiffness.   Eyes: Negative for other worsening pain, redness or other visual disturbance.  Respiratory: Negative for other stridor or swelling Cardiovascular: Negative for other palpitations or other chest pain  Gastrointestinal: Negative for worsening diarrhea or loose stools, blood in stool, distention or other pain Genitourinary: Negative for hematuria, flank pain or other change in urine volume.  Musculoskeletal: Negative for myalgias or other joint swelling.  Skin: Negative for  other color change, or other wound or worsening drainage.  Neurological: Negative for other syncope or numbness. Hematological: Negative for other adenopathy or swelling Psychiatric/Behavioral: Negative for hallucinations, other worsening agitation, SI, self-injury, or new decreased concentration All other system neg per pt    Objective:   Physical Exam BP 114/80   Pulse 78   Temp 98.4 F (36.9  C) (Oral)   Ht 5\' 11"  (1.803 m)   Wt 258 lb (117 kg)   SpO2 99%   BMI 35.98 kg/m  VS noted,  Constitutional: Pt is oriented to person, place, and time. Appears well-developed and well-nourished, in no significant distress and comfortable Head: Normocephalic and atraumatic  Eyes: Conjunctivae and EOM are normal. Pupils are equal, round, and reactive to light Right Ear: External ear normal without discharge Left Ear: External ear normal without discharge Nose: Nose without discharge or deformity Mouth/Throat: Oropharynx is without other ulcerations and moist  Neck: Normal range of motion. Neck supple. No JVD present. No tracheal deviation present or significant neck LA or mass Cardiovascular: Normal rate, regular rhythm, normal heart sounds and intact distal pulses.   Pulmonary/Chest: WOB normal and breath sounds without rales or wheezing  Abdominal: Soft. Bowel sounds are normal. NT. No HSM  Musculoskeletal: Normal range of motion. Exhibits no edema Lymphadenopathy: Has no other cervical adenopathy.  Neurological: Pt is alert and oriented to person, place, and time. Pt has normal reflexes. No cranial nerve deficit. Motor grossly intact, Gait intact Skin: Skin is warm and dry. No rash noted or new ulcerations Psychiatric:  Has normal mood and affect. Behavior is normal without agitation No other exam findings Lab Results  Component Value Date   WBC 6.5 05/03/2017   HGB 14.1 05/03/2017   HCT 43.1 05/03/2017   PLT 242.0 05/03/2017   GLUCOSE 142 (H) 05/03/2017   CHOL 127 05/03/2017   TRIG 126.0 05/03/2017   HDL 33.00 (L) 05/03/2017   LDLCALC 69 05/03/2017   ALT 5 05/03/2017   AST 10 05/03/2017   NA 142 05/03/2017   K 4.1 05/03/2017   CL 107 05/03/2017   CREATININE 1.19 05/03/2017   BUN 12 05/03/2017   CO2 27 05/03/2017   TSH 1.93 05/03/2017   PSA 1.32 05/03/2017   INR 1.2 08/21/2007   HGBA1C 7.2 (H) 05/03/2017   MICROALBUR 19.3 (H) 05/03/2017       Assessment & Plan:

## 2017-05-04 NOTE — Patient Instructions (Addendum)
Please remember to make your yearly Eye doctor appt  Please call if you change your mind about the colonoscopy, shots and increased metformin  Please continue all other medications as before, and refills have been done if requested.  Please have the pharmacy call with any other refills you may need.  Please continue your efforts at being more active, low cholesterol diet, and weight control.  You are otherwise up to date with prevention measures today.  Please keep your appointments with your specialists as you may have planned  Please return in 6 months, or sooner if needed, with Lab testing done 3-5 days before

## 2017-05-05 ENCOUNTER — Other Ambulatory Visit: Payer: Self-pay | Admitting: Internal Medicine

## 2017-05-06 ENCOUNTER — Encounter: Payer: Self-pay | Admitting: Internal Medicine

## 2017-05-06 NOTE — Assessment & Plan Note (Signed)
Lab Results  Component Value Date   HGBA1C 7.2 (H) 05/03/2017  mild uncontrolled, declines increased metformin

## 2017-05-06 NOTE — Assessment & Plan Note (Signed)

## 2017-06-10 ENCOUNTER — Other Ambulatory Visit: Payer: Self-pay | Admitting: Internal Medicine

## 2017-07-15 ENCOUNTER — Other Ambulatory Visit: Payer: Self-pay | Admitting: Internal Medicine

## 2017-07-21 ENCOUNTER — Other Ambulatory Visit: Payer: Self-pay | Admitting: Internal Medicine

## 2017-07-29 ENCOUNTER — Other Ambulatory Visit: Payer: Self-pay | Admitting: Internal Medicine

## 2017-08-11 ENCOUNTER — Other Ambulatory Visit: Payer: Self-pay | Admitting: Internal Medicine

## 2017-11-06 ENCOUNTER — Ambulatory Visit: Payer: Self-pay | Admitting: Internal Medicine

## 2017-12-22 ENCOUNTER — Other Ambulatory Visit: Payer: Self-pay | Admitting: Internal Medicine

## 2018-05-02 ENCOUNTER — Other Ambulatory Visit: Payer: Self-pay | Admitting: Internal Medicine

## 2020-03-11 ENCOUNTER — Inpatient Hospital Stay (HOSPITAL_COMMUNITY)
Admission: EM | Admit: 2020-03-11 | Discharge: 2020-03-29 | DRG: 260 | Disposition: A | Discharge status: Home / Self Care | Payer: No Typology Code available for payment source | Attending: Internal Medicine | Admitting: Internal Medicine

## 2020-03-11 ENCOUNTER — Encounter (HOSPITAL_COMMUNITY): Payer: Self-pay | Admitting: Emergency Medicine

## 2020-03-11 ENCOUNTER — Emergency Department (HOSPITAL_COMMUNITY): Payer: No Typology Code available for payment source

## 2020-03-11 DIAGNOSIS — R29713 NIHSS score 13: Secondary | ICD-10-CM | POA: Diagnosis not present

## 2020-03-11 DIAGNOSIS — R29712 NIHSS score 12: Secondary | ICD-10-CM | POA: Diagnosis not present

## 2020-03-11 DIAGNOSIS — I639 Cerebral infarction, unspecified: Secondary | ICD-10-CM

## 2020-03-11 DIAGNOSIS — R29810 Facial weakness: Secondary | ICD-10-CM | POA: Diagnosis not present

## 2020-03-11 DIAGNOSIS — Z6833 Body mass index (BMI) 33.0-33.9, adult: Secondary | ICD-10-CM

## 2020-03-11 DIAGNOSIS — Z841 Family history of disorders of kidney and ureter: Secondary | ICD-10-CM

## 2020-03-11 DIAGNOSIS — Z794 Long term (current) use of insulin: Secondary | ICD-10-CM

## 2020-03-11 DIAGNOSIS — R4701 Aphasia: Secondary | ICD-10-CM | POA: Diagnosis not present

## 2020-03-11 DIAGNOSIS — I161 Hypertensive emergency: Secondary | ICD-10-CM | POA: Diagnosis present

## 2020-03-11 DIAGNOSIS — R29711 NIHSS score 11: Secondary | ICD-10-CM | POA: Diagnosis not present

## 2020-03-11 DIAGNOSIS — E1122 Type 2 diabetes mellitus with diabetic chronic kidney disease: Secondary | ICD-10-CM | POA: Diagnosis present

## 2020-03-11 DIAGNOSIS — N179 Acute kidney failure, unspecified: Secondary | ICD-10-CM | POA: Diagnosis present

## 2020-03-11 DIAGNOSIS — Z9114 Patient's other noncompliance with medication regimen: Secondary | ICD-10-CM

## 2020-03-11 DIAGNOSIS — Z20822 Contact with and (suspected) exposure to covid-19: Secondary | ICD-10-CM | POA: Diagnosis present

## 2020-03-11 DIAGNOSIS — Y92009 Unspecified place in unspecified non-institutional (private) residence as the place of occurrence of the external cause: Secondary | ICD-10-CM

## 2020-03-11 DIAGNOSIS — R451 Restlessness and agitation: Secondary | ICD-10-CM | POA: Diagnosis not present

## 2020-03-11 DIAGNOSIS — R52 Pain, unspecified: Secondary | ICD-10-CM

## 2020-03-11 DIAGNOSIS — Q211 Atrial septal defect: Secondary | ICD-10-CM

## 2020-03-11 DIAGNOSIS — E782 Mixed hyperlipidemia: Secondary | ICD-10-CM

## 2020-03-11 DIAGNOSIS — G8929 Other chronic pain: Secondary | ICD-10-CM | POA: Diagnosis present

## 2020-03-11 DIAGNOSIS — E669 Obesity, unspecified: Secondary | ICD-10-CM | POA: Diagnosis present

## 2020-03-11 DIAGNOSIS — R0602 Shortness of breath: Secondary | ICD-10-CM | POA: Diagnosis not present

## 2020-03-11 DIAGNOSIS — W07XXXA Fall from chair, initial encounter: Secondary | ICD-10-CM | POA: Diagnosis present

## 2020-03-11 DIAGNOSIS — I5043 Acute on chronic combined systolic (congestive) and diastolic (congestive) heart failure: Secondary | ICD-10-CM | POA: Diagnosis present

## 2020-03-11 DIAGNOSIS — I160 Hypertensive urgency: Secondary | ICD-10-CM | POA: Diagnosis present

## 2020-03-11 DIAGNOSIS — N19 Unspecified kidney failure: Secondary | ICD-10-CM

## 2020-03-11 DIAGNOSIS — I428 Other cardiomyopathies: Secondary | ICD-10-CM | POA: Diagnosis present

## 2020-03-11 DIAGNOSIS — N189 Chronic kidney disease, unspecified: Secondary | ICD-10-CM | POA: Diagnosis present

## 2020-03-11 DIAGNOSIS — R29710 NIHSS score 10: Secondary | ICD-10-CM | POA: Diagnosis not present

## 2020-03-11 DIAGNOSIS — G8191 Hemiplegia, unspecified affecting right dominant side: Secondary | ICD-10-CM | POA: Diagnosis not present

## 2020-03-11 DIAGNOSIS — I130 Hypertensive heart and chronic kidney disease with heart failure and stage 1 through stage 4 chronic kidney disease, or unspecified chronic kidney disease: Principal | ICD-10-CM | POA: Diagnosis present

## 2020-03-11 DIAGNOSIS — G4733 Obstructive sleep apnea (adult) (pediatric): Secondary | ICD-10-CM | POA: Diagnosis present

## 2020-03-11 DIAGNOSIS — R471 Dysarthria and anarthria: Secondary | ICD-10-CM | POA: Diagnosis not present

## 2020-03-11 DIAGNOSIS — I5023 Acute on chronic systolic (congestive) heart failure: Secondary | ICD-10-CM | POA: Diagnosis present

## 2020-03-11 DIAGNOSIS — Z7982 Long term (current) use of aspirin: Secondary | ICD-10-CM

## 2020-03-11 DIAGNOSIS — M25551 Pain in right hip: Secondary | ICD-10-CM | POA: Diagnosis present

## 2020-03-11 DIAGNOSIS — Z79899 Other long term (current) drug therapy: Secondary | ICD-10-CM

## 2020-03-11 DIAGNOSIS — K148 Other diseases of tongue: Secondary | ICD-10-CM | POA: Diagnosis not present

## 2020-03-11 DIAGNOSIS — E876 Hypokalemia: Secondary | ICD-10-CM | POA: Diagnosis present

## 2020-03-11 DIAGNOSIS — I2510 Atherosclerotic heart disease of native coronary artery without angina pectoris: Secondary | ICD-10-CM

## 2020-03-11 DIAGNOSIS — W19XXXA Unspecified fall, initial encounter: Secondary | ICD-10-CM

## 2020-03-11 DIAGNOSIS — E119 Type 2 diabetes mellitus without complications: Secondary | ICD-10-CM

## 2020-03-11 DIAGNOSIS — R7989 Other specified abnormal findings of blood chemistry: Secondary | ICD-10-CM | POA: Diagnosis present

## 2020-03-11 DIAGNOSIS — I6330 Cerebral infarction due to thrombosis of unspecified cerebral artery: Secondary | ICD-10-CM | POA: Insufficient documentation

## 2020-03-11 LAB — BASIC METABOLIC PANEL
Anion gap: 16 — ABNORMAL HIGH (ref 5–15)
BUN: 23 mg/dL (ref 8–23)
CO2: 18 mmol/L — ABNORMAL LOW (ref 22–32)
Calcium: 8.9 mg/dL (ref 8.9–10.3)
Chloride: 107 mmol/L (ref 98–111)
Creatinine, Ser: 2.11 mg/dL — ABNORMAL HIGH (ref 0.61–1.24)
GFR, Estimated: 35 mL/min — ABNORMAL LOW (ref >60)
Glucose, Bld: 146 mg/dL — ABNORMAL HIGH (ref 70–99)
Potassium: 3.2 mmol/L — ABNORMAL LOW (ref 3.5–5.1)
Sodium: 141 mmol/L (ref 135–145)

## 2020-03-11 LAB — CBC
HCT: 40.7 % (ref 39.0–52.0)
Hemoglobin: 12.6 g/dL — ABNORMAL LOW (ref 13.0–17.0)
MCH: 30.6 pg (ref 26.0–34.0)
MCHC: 31 g/dL (ref 30.0–36.0)
MCV: 98.8 fL (ref 80.0–100.0)
Platelets: 276 10*3/uL (ref 150–400)
RBC: 4.12 MIL/uL — ABNORMAL LOW (ref 4.22–5.81)
RDW: 13.5 % (ref 11.5–15.5)
WBC: 10.9 10*3/uL — ABNORMAL HIGH (ref 4.0–10.5)
nRBC: 0 % (ref 0.0–0.2)

## 2020-03-11 LAB — RESPIRATORY PANEL BY RT PCR (FLU A&B, COVID)
Influenza A by PCR: NEGATIVE
Influenza B by PCR: NEGATIVE
SARS Coronavirus 2 by RT PCR: NEGATIVE

## 2020-03-11 LAB — CBG MONITORING, ED: Glucose-Capillary: 130 mg/dL — ABNORMAL HIGH (ref 70–99)

## 2020-03-11 LAB — BRAIN NATRIURETIC PEPTIDE: B Natriuretic Peptide: 705.4 pg/mL — ABNORMAL HIGH (ref 0.0–100.0)

## 2020-03-11 MED ORDER — FUROSEMIDE 10 MG/ML IJ SOLN
20.0000 mg | Freq: Once | INTRAMUSCULAR | Status: AC
  Administered 2020-03-11: 20 mg via INTRAVENOUS
  Filled 2020-03-11: qty 2

## 2020-03-11 MED ORDER — LABETALOL HCL 5 MG/ML IV SOLN
5.0000 mg | Freq: Once | INTRAVENOUS | Status: AC
  Administered 2020-03-11: 5 mg via INTRAVENOUS
  Filled 2020-03-11: qty 4

## 2020-03-11 MED ORDER — LABETALOL HCL 5 MG/ML IV SOLN
5.0000 mg | Freq: Once | INTRAVENOUS | Status: AC
  Administered 2020-03-12: 5 mg via INTRAVENOUS

## 2020-03-11 MED ORDER — HEPARIN BOLUS VIA INFUSION
6000.0000 [IU] | Freq: Once | INTRAVENOUS | Status: AC
  Administered 2020-03-11: 6000 [IU] via INTRAVENOUS
  Filled 2020-03-11: qty 6000

## 2020-03-11 MED ORDER — NITROGLYCERIN 0.4 MG SL SUBL
0.4000 mg | SUBLINGUAL_TABLET | SUBLINGUAL | Status: DC | PRN
  Administered 2020-03-11: 0.4 mg via SUBLINGUAL
  Filled 2020-03-11: qty 1

## 2020-03-11 MED ORDER — HEPARIN (PORCINE) 25000 UT/250ML-% IV SOLN
1600.0000 [IU]/h | INTRAVENOUS | Status: DC
  Administered 2020-03-11: 1600 [IU]/h via INTRAVENOUS
  Filled 2020-03-11: qty 250

## 2020-03-11 NOTE — ED Notes (Signed)
Next of kin notified patient is covid neg will attempt to reach son who apparently is in the parking lot again in a little bit 2 calls attempted phone # provided [phone] rang busy.

## 2020-03-11 NOTE — ED Notes (Signed)
Patient denies any chest pain at this time. Remains SOB asking for something to drink.

## 2020-03-11 NOTE — ED Notes (Signed)
Glen Pilson (Brother's#[phone]) called/would like a call back on his brother's status.  Thank you

## 2020-03-11 NOTE — ED Provider Notes (Signed)
Emergency Department Provider Note   I have reviewed the triage vital signs and the nursing notes.   HISTORY  Chief Complaint Shortness of Breath   HPI Trevor Anderson is a 64 y.o. male with past medical history of CHF, diabetes, hypertension, hyperlipidemia presents to the emergency department with shortness of breath over the past week.  Patient is concerned primarily for possible Covid.  He has not had fevers but feels like his shortness of breath is worsened.  Denies prior history of similar symptoms.  He denies any active chest pain.  He states he has had some central tightness on and off but nothing persistent or severe.  No vomiting or diaphoresis.  He has been compliant with his home medications.  He has chronic pain in his hips and did have a fall today but has been up and ambulatory since.  No radiation of symptoms. SOB symptoms worsening significantly with any ambulation.   Past Medical History:  Diagnosis Date  . 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 CONGESTIVE HEART FAILURE 11/12/2007  . DIABETES MELLITUS, TYPE II 11/20/2008  . HYPERLIPIDEMIA 11/12/2007  . HYPERTENSION 11/12/2007  . TACHYCARDIA 01/24/2010    Patient Active Problem List   Diagnosis Date Noted  . Hypertensive urgency 03/12/2020  . Acute on chronic systolic CHF (congestive heart failure) (HCC) 03/12/2020  . AKI (acute kidney injury) (HCC) 03/12/2020  . Fall at home, initial encounter 03/12/2020  . Right hip pain 03/12/2020  . Hypokalemia 03/12/2020  . Coronary artery disease involving native coronary artery of native heart without angina pectoris 09/07/2014  . Renal insufficiency 12/02/2012  . Preventative health care 11/13/2010  . 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 TACHYCARDIA 01/24/2010  . Type 2 diabetes mellitus without complication, with Jahking Lesser-term current use of insulin (HCC) 11/20/2008  . Mixed hyperlipidemia 11/12/2007  . Essential hypertension 11/12/2007  . CONGESTIVE HEART FAILURE  11/12/2007    Past Surgical History:  Procedure Laterality Date  . TONSILLECTOMY  1970    Allergies Patient has no known allergies.  Family History  Problem Relation Age of Onset  . Kidney disease Mother     Social History Social History   Tobacco Use  . Smoking status: Never Smoker  . Smokeless tobacco: Never Used  Substance Use Topics  . Alcohol use: Yes    Comment: social  . Drug use: No    Review of Systems  Constitutional: No fever/chills Eyes: No visual changes. ENT: No sore throat. Cardiovascular: Positive chest pain. Respiratory: Positive shortness of breath. Gastrointestinal: No abdominal pain.  No nausea, no vomiting.  No diarrhea.  No constipation. Genitourinary: Negative for dysuria. Musculoskeletal: Negative for back pain. Chronic hip pain.  Skin: Negative for rash. Neurological: Negative for headaches, focal weakness or numbness.  10-point ROS otherwise negative.  ____________________________________________   PHYSICAL EXAM:  VITAL SIGNS: ED Triage Vitals [03/11/20 1812]  Enc Vitals Group     BP (!) 213/151     Pulse Rate (!) 128     Resp (!) 24     Temp 98.5 F (36.9 C)     Temp Source Oral     SpO2 95 %   Constitutional: Alert and oriented. Well appearing and in no acute distress. Eyes: Conjunctivae are normal.  Head: Atraumatic. Nose: No congestion/rhinnorhea. Mouth/Throat: Mucous membranes are moist.   Neck: No stridor.  Cardiovascular: Tachycardia. Good peripheral circulation. Grossly normal heart sounds.   Respiratory: Increased respiratory effort.  No retractions. Lungs with crackles at the bases. No wheezing.  Gastrointestinal: Soft and nontender. No distention.  Musculoskeletal: No lower extremity tenderness with trace pitting edema bilaterally. No gross deformities of extremities. Neurologic:  Normal speech and language. No gross focal neurologic deficits are appreciated. Equal strength and sensation in the B/L upper and  lower extremities. No facial asymmetry.  Skin:  Skin is warm, dry and intact. No rash noted.  ____________________________________________   LABS (all labs ordered are listed, but only abnormal results are displayed)  Labs Reviewed  CBC - Abnormal; Notable for the following components:      Result Value   WBC 10.9 (*)    RBC 4.12 (*)    Hemoglobin 12.6 (*)    All other components within normal limits  BASIC METABOLIC PANEL - Abnormal; Notable for the following components:   Potassium 3.2 (*)    CO2 18 (*)    Glucose, Bld 146 (*)    Creatinine, Ser 2.11 (*)    GFR, Estimated 35 (*)    Anion gap 16 (*)    All other components within normal limits  BRAIN NATRIURETIC PEPTIDE - Abnormal; Notable for the following components:   B Natriuretic Peptide 705.4 (*)    All other components within normal limits  CBC - Abnormal; Notable for the following components:   WBC 12.4 (*)    All other components within normal limits  HEMOGLOBIN A1C - Abnormal; Notable for the following components:   Hgb A1c MFr Bld 6.8 (*)    All other components within normal limits  LIPID PANEL - Abnormal; Notable for the following components:   Cholesterol 232 (*)    LDL Cholesterol 173 (*)    All other components within normal limits  D-DIMER, QUANTITATIVE (NOT AT [facility]) - Abnormal; Notable for the following components:   D-Dimer, Quant 1.39 (*)    All other components within normal limits  URINALYSIS, COMPLETE (UACMP) WITH MICROSCOPIC - Abnormal; Notable for the following components:   Color, Urine STRAW (*)    Hgb urine dipstick MODERATE (*)    Ketones, ur 5 (*)    Protein, ur 100 (*)    All other components within normal limits  GLUCOSE, CAPILLARY - Abnormal; Notable for the following components:   Glucose-Capillary 111 (*)    All other components within normal limits  GLUCOSE, CAPILLARY - Abnormal; Notable for the following components:   Glucose-Capillary 133 (*)    All other components within  normal limits  CBG MONITORING, ED - Abnormal; Notable for the following components:   Glucose-Capillary 130 (*)    All other components within normal limits  TROPONIN I (HIGH SENSITIVITY) - Abnormal; Notable for the following components:   Troponin I (High Sensitivity) 129 (*)    All other components within normal limits  TROPONIN I (HIGH SENSITIVITY) - Abnormal; Notable for the following components:   Troponin I (High Sensitivity) 112 (*)    All other components within normal limits  RESPIRATORY PANEL BY RT PCR (FLU A&B, COVID)  HEPARIN LEVEL (UNFRACTIONATED)  HIV ANTIBODY (ROUTINE TESTING W REFLEX)  MAGNESIUM  HEPARIN LEVEL (UNFRACTIONATED)   ____________________________________________  EKG   EKG Interpretation  Date/Time:  Thursday March 11 2020 18:10:46 EST Ventricular Rate:  125 PR Interval:  134 QRS Duration: 82 QT Interval:  328 QTC Calculation: 473 R Axis:   -10 Text Interpretation: Sinus tachycardia Biatrial enlargement Left ventricular hypertrophy ( R in aVL , Cornell product , Romhilt-Estes ) Nonspecific T wave abnormality Abnormal ECG No STEMI Confirmed by Alona Bene [phone]) on  03/11/2020 7:23:10 PM       ____________________________________________  RADIOLOGY  CT Code Stroke CTA Head W/WO contrast  Result Date: 03/12/2020 CLINICAL DATA:  64 year old male code stroke presentation. Right side symptoms. EXAM: CT ANGIOGRAPHY HEAD AND NECK CT PERFUSION BRAIN TECHNIQUE: Multidetector CT imaging of the head and neck was performed using the standard protocol during bolus administration of intravenous contrast. Multiplanar CT image reconstructions and MIPs were obtained to evaluate the vascular anatomy. Carotid stenosis measurements (when applicable) are obtained utilizing NASCET criteria, using the distal internal carotid diameter as the denominator. Multiphase CT imaging of the brain was performed following IV bolus contrast injection. Subsequent parametric  perfusion maps were calculated using RAPID software. CONTRAST:  OMNIPAQUE IOHEXOL 350 MG/ML SOLN COMPARISON:  Plain head CT 0845 hours. FINDINGS: CT Brain Perfusion Findings: ASPECTS: 10 CBF (<30%) Volume: None Perfusion (Tmax>6.0s) volume: None Mismatch Volume: Not applicable Infarction Location:Not applicable CTA NECK Skeleton: No acute osseous abnormality identified. Upper chest: Upper lung atelectasis. Negative visible superior mediastinum. Other neck: The glottis is closed. No acute neck soft tissue finding is identified. Aortic arch: 3 vessel arch configuration. Minimal arch atherosclerosis. Right carotid system: Tortuous brachiocephalic artery and proximal right CCA without stenosis. Minimal calcified plaque at the right ICA origin and bulb without stenosis. Left carotid system: Mildly tortuous proximal left CCA. Mild calcified plaque at the left ICA origin and bulb without stenosis. Vertebral arteries: Calcified plaque and tortuosity at the right subclavian artery origin without significant stenosis. Normal right vertebral artery origin. Tortuous right V1 segment. Patent right vertebral to the skull base without stenosis. Mildly tortuous proximal left subclavian artery without significant plaque. Normal left vertebral artery origin. Mildly tortuous left V1 segment. Codominant left vertebral is patent to the skull base without plaque or stenosis. CTA HEAD Posterior circulation: Patent right vertebral V4 segment to the vertebrobasilar junction without stenosis. Patent right PICA origin. Normal left PICA origin. Beyond the left PICA in the left V4 segment there is evidence of soft plaque on series 7, image 159 but only mild associated stenosis. Patent left vertebrobasilar junction. Patent basilar artery with mild irregularity but no stenosis. Patent SCA and left PCA origins. Fetal right PCA origin. Left posterior communicating artery diminutive or absent. Mild to moderate left PCA P2 segment irregularity  and stenosis on series 12, image 23. Mild right PCA irregularity without stenosis. Anterior circulation: Both ICA siphons are patent without plaque or stenosis. Normal ophthalmic and right posterior communicating artery origins. Patent carotid termini. Patent MCA and ACA origins. Diminutive or absent anterior communicating artery. Mild bilateral ACA irregularity. No proximal ACA stenosis. Up to moderate distal ACA irregularity and stenosis on series 12, image 21. Right MCA M1 segment is patent to the bifurcation with mild irregularity and stenosis. At the right MCA bifurcation there is moderate stenosis. Right MCA M2 branches are patent. There is moderate right posterior MCA M2 and M3 branch irregularity and stenosis (series 12, image 12. Moderate right anterior M3 irregularity and stenosis. No right MCA branch occlusion identified. Mild to moderate irregularity with mild stenosis of the distal left MCA M1 (series 7, image 116). Left MCA bifurcation is patent. There is no M2 branch occlusion. Only mild proximal M2 irregularity. There is more moderate irregularity of the distal posterior M2 with mild to moderate stenosis (series 12, image 29). No right MCA branch occlusion is identified. Venous sinuses: Early contrast timing, not well evaluated. Anatomic variants: Fetal right PCA origin. Review of the MIP images confirms the above  findings IMPRESSION: 1. Negative for large vessel occlusion, and no core infarct or ischemic penumbra detected by CT Perfusion. 2. Mild for age extracranial but fairly advanced intracranial atherosclerosis. Up to moderate circle-of-Willis branch irregularity and stenoses in the bilateral MCAs (M2 and M3), left PCA (P2), and distal ACAs 3. Preliminary results of the above discussed by telephone with Dr. Otelia Limes at [phone] hours. Final results were communicated to Dr. Otelia Limes at [phone] hours 03/12/2020 by text page via the [facility] messaging system. Electronically Signed   By: Odessa Fleming M.D.   On:  03/12/2020 09:55   DG Chest 2 View  Result Date: 03/11/2020 CLINICAL DATA:  Cough, shortness of breath EXAM: CHEST - 2 VIEW COMPARISON:  08/21/2007 FINDINGS: Cardiomegaly. No confluent opacities, effusions or edema. No acute bony abnormality. IMPRESSION: Cardiomegaly.  No acute cardiopulmonary disease. Electronically Signed   By: Charlett Nose M.D.   On: 03/11/2020 18:53   CT Code Stroke CTA Neck W/WO contrast  Result Date: 03/12/2020 CLINICAL DATA:  64 year old male code stroke presentation. Right side symptoms. EXAM: CT ANGIOGRAPHY HEAD AND NECK CT PERFUSION BRAIN TECHNIQUE: Multidetector CT imaging of the head and neck was performed using the standard protocol during bolus administration of intravenous contrast. Multiplanar CT image reconstructions and MIPs were obtained to evaluate the vascular anatomy. Carotid stenosis measurements (when applicable) are obtained utilizing NASCET criteria, using the distal internal carotid diameter as the denominator. Multiphase CT imaging of the brain was performed following IV bolus contrast injection. Subsequent parametric perfusion maps were calculated using RAPID software. CONTRAST:  OMNIPAQUE IOHEXOL 350 MG/ML SOLN COMPARISON:  Plain head CT 0845 hours. FINDINGS: CT Brain Perfusion Findings: ASPECTS: 10 CBF (<30%) Volume: None Perfusion (Tmax>6.0s) volume: None Mismatch Volume: Not applicable Infarction Location:Not applicable CTA NECK Skeleton: No acute osseous abnormality identified. Upper chest: Upper lung atelectasis. Negative visible superior mediastinum. Other neck: The glottis is closed. No acute neck soft tissue finding is identified. Aortic arch: 3 vessel arch configuration. Minimal arch atherosclerosis. Right carotid system: Tortuous brachiocephalic artery and proximal right CCA without stenosis. Minimal calcified plaque at the right ICA origin and bulb without stenosis. Left carotid system: Mildly tortuous proximal left CCA. Mild calcified plaque  at the left ICA origin and bulb without stenosis. Vertebral arteries: Calcified plaque and tortuosity at the right subclavian artery origin without significant stenosis. Normal right vertebral artery origin. Tortuous right V1 segment. Patent right vertebral to the skull base without stenosis. Mildly tortuous proximal left subclavian artery without significant plaque. Normal left vertebral artery origin. Mildly tortuous left V1 segment. Codominant left vertebral is patent to the skull base without plaque or stenosis. CTA HEAD Posterior circulation: Patent right vertebral V4 segment to the vertebrobasilar junction without stenosis. Patent right PICA origin. Normal left PICA origin. Beyond the left PICA in the left V4 segment there is evidence of soft plaque on series 7, image 159 but only mild associated stenosis. Patent left vertebrobasilar junction. Patent basilar artery with mild irregularity but no stenosis. Patent SCA and left PCA origins. Fetal right PCA origin. Left posterior communicating artery diminutive or absent. Mild to moderate left PCA P2 segment irregularity and stenosis on series 12, image 23. Mild right PCA irregularity without stenosis. Anterior circulation: Both ICA siphons are patent without plaque or stenosis. Normal ophthalmic and right posterior communicating artery origins. Patent carotid termini. Patent MCA and ACA origins. Diminutive or absent anterior communicating artery. Mild bilateral ACA irregularity. No proximal ACA stenosis. Up to moderate distal ACA irregularity and stenosis  on series 12, image 21. Right MCA M1 segment is patent to the bifurcation with mild irregularity and stenosis. At the right MCA bifurcation there is moderate stenosis. Right MCA M2 branches are patent. There is moderate right posterior MCA M2 and M3 branch irregularity and stenosis (series 12, image 12. Moderate right anterior M3 irregularity and stenosis. No right MCA branch occlusion identified. Mild to  moderate irregularity with mild stenosis of the distal left MCA M1 (series 7, image 116). Left MCA bifurcation is patent. There is no M2 branch occlusion. Only mild proximal M2 irregularity. There is more moderate irregularity of the distal posterior M2 with mild to moderate stenosis (series 12, image 29). No right MCA branch occlusion is identified. Venous sinuses: Early contrast timing, not well evaluated. Anatomic variants: Fetal right PCA origin. Review of the MIP images confirms the above findings IMPRESSION: 1. Negative for large vessel occlusion, and no core infarct or ischemic penumbra detected by CT Perfusion. 2. Mild for age extracranial but fairly advanced intracranial atherosclerosis. Up to moderate circle-of-Willis branch irregularity and stenoses in the bilateral MCAs (M2 and M3), left PCA (P2), and distal ACAs 3. Preliminary results of the above discussed by telephone with Dr. Otelia Limes at [phone] hours. Final results were communicated to Dr. Otelia Limes at [phone] hours 03/12/2020 by text page via the [facility] messaging system. Electronically Signed   By: Odessa Fleming M.D.   On: 03/12/2020 09:55   US RENAL  Result Date: 03/12/2020 CLINICAL DATA:  Renal failure EXAM: RENAL / URINARY TRACT ULTRASOUND COMPLETE COMPARISON:  08/22/2007 FINDINGS: Right Kidney: Renal measurements: 11 x 5 x 5 cm = volume: 140 mL. Echogenicity is borderline increased based on corticomedullary differentiation. No mass or hydronephrosis visualized. Left Kidney: Renal measurements: 11 x 7 x 5 cm = volume: There is 200 mL. Echogenicity is borderline increased. No mass or hydronephrosis visualized. Bladder: Appears normal for degree of bladder distention. Other: Prominent liver echogenicity which could reflect steatosis. IMPRESSION: No hydronephrosis or renal atrophy. Electronically Signed   By: Marnee Spring M.D.   On: 03/12/2020 04:09   CT HIP RIGHT WO CONTRAST  Result Date: 03/12/2020 CLINICAL DATA:  Concern for hip fracture.   Inconclusive x-ray EXAM: CT OF THE RIGHT HIP WITHOUT CONTRAST TECHNIQUE: Multidetector CT imaging of the right hip was performed according to the standard protocol. Multiplanar CT image reconstructions were also generated. COMPARISON:  Radiography from yesterday FINDINGS: Bones/Joint/Cartilage No acute fracture or dislocation. Hip osteoarthritis with superolateral joint narrowing causing bone-on-bone contact and subchondral cystic formation. Degenerative marginal spurring at the hip joint which is fairly bulky. Degenerative right sacroiliac spurring. Ligaments Suboptimally assessed by CT. Muscles and Tendons No visible injury. Soft tissues No hematoma or opaque foreign body IMPRESSION: 1. No acute finding. 2. Advanced right hip osteoarthritis. Electronically Signed   By: Marnee Spring M.D.   On: 03/12/2020 04:07   CT Code Stroke Cerebral Perfusion with contrast  Result Date: 03/12/2020 CLINICAL DATA:  64 year old male code stroke presentation. Right side symptoms. EXAM: CT ANGIOGRAPHY HEAD AND NECK CT PERFUSION BRAIN TECHNIQUE: Multidetector CT imaging of the head and neck was performed using the standard protocol during bolus administration of intravenous contrast. Multiplanar CT image reconstructions and MIPs were obtained to evaluate the vascular anatomy. Carotid stenosis measurements (when applicable) are obtained utilizing NASCET criteria, using the distal internal carotid diameter as the denominator. Multiphase CT imaging of the brain was performed following IV bolus contrast injection. Subsequent parametric perfusion maps were calculated using RAPID software. CONTRAST:  OMNIPAQUE IOHEXOL 350 MG/ML SOLN COMPARISON:  Plain head CT 0845 hours. FINDINGS: CT Brain Perfusion Findings: ASPECTS: 10 CBF (<30%) Volume: None Perfusion (Tmax>6.0s) volume: None Mismatch Volume: Not applicable Infarction Location:Not applicable CTA NECK Skeleton: No acute osseous abnormality identified. Upper chest: Upper lung  atelectasis. Negative visible superior mediastinum. Other neck: The glottis is closed. No acute neck soft tissue finding is identified. Aortic arch: 3 vessel arch configuration. Minimal arch atherosclerosis. Right carotid system: Tortuous brachiocephalic artery and proximal right CCA without stenosis. Minimal calcified plaque at the right ICA origin and bulb without stenosis. Left carotid system: Mildly tortuous proximal left CCA. Mild calcified plaque at the left ICA origin and bulb without stenosis. Vertebral arteries: Calcified plaque and tortuosity at the right subclavian artery origin without significant stenosis. Normal right vertebral artery origin. Tortuous right V1 segment. Patent right vertebral to the skull base without stenosis. Mildly tortuous proximal left subclavian artery without significant plaque. Normal left vertebral artery origin. Mildly tortuous left V1 segment. Codominant left vertebral is patent to the skull base without plaque or stenosis. CTA HEAD Posterior circulation: Patent right vertebral V4 segment to the vertebrobasilar junction without stenosis. Patent right PICA origin. Normal left PICA origin. Beyond the left PICA in the left V4 segment there is evidence of soft plaque on series 7, image 159 but only mild associated stenosis. Patent left vertebrobasilar junction. Patent basilar artery with mild irregularity but no stenosis. Patent SCA and left PCA origins. Fetal right PCA origin. Left posterior communicating artery diminutive or absent. Mild to moderate left PCA P2 segment irregularity and stenosis on series 12, image 23. Mild right PCA irregularity without stenosis. Anterior circulation: Both ICA siphons are patent without plaque or stenosis. Normal ophthalmic and right posterior communicating artery origins. Patent carotid termini. Patent MCA and ACA origins. Diminutive or absent anterior communicating artery. Mild bilateral ACA irregularity. No proximal ACA stenosis. Up to  moderate distal ACA irregularity and stenosis on series 12, image 21. Right MCA M1 segment is patent to the bifurcation with mild irregularity and stenosis. At the right MCA bifurcation there is moderate stenosis. Right MCA M2 branches are patent. There is moderate right posterior MCA M2 and M3 branch irregularity and stenosis (series 12, image 12. Moderate right anterior M3 irregularity and stenosis. No right MCA branch occlusion identified. Mild to moderate irregularity with mild stenosis of the distal left MCA M1 (series 7, image 116). Left MCA bifurcation is patent. There is no M2 branch occlusion. Only mild proximal M2 irregularity. There is more moderate irregularity of the distal posterior M2 with mild to moderate stenosis (series 12, image 29). No right MCA branch occlusion is identified. Venous sinuses: Early contrast timing, not well evaluated. Anatomic variants: Fetal right PCA origin. Review of the MIP images confirms the above findings IMPRESSION: 1. Negative for large vessel occlusion, and no core infarct or ischemic penumbra detected by CT Perfusion. 2. Mild for age extracranial but fairly advanced intracranial atherosclerosis. Up to moderate circle-of-Willis branch irregularity and stenoses in the bilateral MCAs (M2 and M3), left PCA (P2), and distal ACAs 3. Preliminary results of the above discussed by telephone with Dr. Otelia Limes at [phone] hours. Final results were communicated to Dr. Otelia Limes at [phone] hours 03/12/2020 by text page via the [facility] messaging system. Electronically Signed   By: Odessa Fleming M.D.   On: 03/12/2020 09:55   DG HIP UNILAT WITH PELVIS 2-3 VIEWS RIGHT  Result Date: 03/11/2020 CLINICAL DATA:  Fall EXAM: DG HIP (WITH OR  WITHOUT PELVIS) 2-3V RIGHT COMPARISON:  None. FINDINGS: Image quality degraded by patient body habitus. Subcapital band of sclerosis and questionable transcortical lucency of the right femoral neck with possible impaction. Best seen on the frogleg lateral view.  Femoral head remains normally located albeit severe arthrosis and bone-on-bone contact along the superior acetabular margin. Additional severe degenerative changes in the left hip as well. Both hips demonstrate extensive subcortical cystic change, sclerotic features and geode formations. More diffuse enthesopathic changes about the hips and pelvis. Degenerative features in the lower lumbar spine. Mild soft tissue swelling noted over the lateral right hip as well as mild diffuse body wall edema. IMPRESSION: 1. Subcapital band of sclerosis and questionable transcortical lucency of the right femoral neck, suspicious for minimally impacted subcapital femur fracture. Right lateral hip swelling. 2. 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 3. Additional enthesopathic changes pelvis and degenerative changes in the spine. 4. Mild body wall edema. Electronically Signed   By: Kreg Shropshire M.D.   On: 03/11/2020 21:37   CT HEAD CODE STROKE WO CONTRAST  Result Date: 03/12/2020 CLINICAL DATA:  Code stroke. 64 year old male with right side weakness and facial droop. EXAM: CT HEAD WITHOUT CONTRAST TECHNIQUE: Contiguous axial images were obtained from the base of the skull through the vertex without intravenous contrast. COMPARISON:  None. FINDINGS: Brain: Scattered bilateral Patchy and confluent cerebral white matter hypodensity with similar heterogeneity in the left basal ganglia and bilateral thalami. No acute intracranial hemorrhage identified. No midline shift, mass effect, or evidence of intracranial mass lesion. No changes of acute cortically based infarct identified. Incidental cavum septum pellucidum (normal variant). Vascular: Calcified atherosclerosis at the skull base. Hyperdense left MCA branch in the left sylvian fissure on series 3, image 20. Skull: No acute osseous abnormality identified. Sinuses/Orbits: Occasional sinus mucosal thickening or small retention cysts. Tympanic cavities and  mastoids are well aerated. Other: Leftward gaze. No other acute orbit or scalp soft tissue finding. ASPECTS [facility] Stroke Program Early CT Score) Total score (0-10 with 10 being normal): 10 IMPRESSION: 1. Hyperdense Left MCA branch in the left Sylvian fissure suspicious for emergent large vessel occlusion. 2. No acute cortically based infarct or acute intracranial hemorrhage identified. ASPECTS 10. 3. Evidence of advanced underlying small vessel disease. 4. Study discussed by telephone with Dr. Otelia Limes on 03/12/2020 at 08:53 . Electronically Signed   By: Odessa Fleming M.D.   On: 03/12/2020 08:55    ____________________________________________   PROCEDURES  Procedure(s) performed:   .Critical Care Performed by: Maia Plan, MD Authorized by: Maia Plan, MD   Critical care provider statement:    Critical care time (minutes):  45   Critical care time was exclusive of:  Separately billable procedures and treating other patients and teaching time   Critical care was necessary to treat or prevent imminent or life-threatening deterioration of the following conditions:  Cardiac failure, circulatory failure and respiratory failure   Critical care was time spent personally by me on the following activities:  Discussions with consultants, evaluation of patient's response to treatment, examination of patient, ordering and performing treatments and interventions, ordering and review of laboratory studies, ordering and review of radiographic studies, pulse oximetry, re-evaluation of patient's condition, obtaining history from patient or surrogate, review of old charts, blood draw for specimens and development of treatment plan with patient or surrogate   I assumed direction of critical care for this patient from another provider in my specialty: no  ____________________________________________   INITIAL IMPRESSION / ASSESSMENT AND PLAN / ED COURSE  Pertinent labs & imaging results that were  available during my care of the patient were reviewed by me and considered in my medical decision making (see chart for details).   Patient presents to the emergency department for evaluation of shortness of breath worsening over the past week.  His blood pressure is significantly elevated here and lab work shows kidney function worse than prior but no recent labs.  Patient does have trace edema and elevated BNP.  Chest x-ray shows cardiomegaly.  Considered PE is a possibility but patient is not having significant chest pain.  His creatinine will not allow for CTA of the chest.  Plan to lower blood pressure with nitroglycerin and labetalol. No flash pulmonary edema symptoms. Plan for lasix IV as well. Patient with borderline hypoxemia but significant increased WOB with movement in the bed.   09:45 PM  Plain films with question of right femoral neck lucency/subcapital fx. as a point of clarification the patient states that he has had pain in the right hip for a while and not significantly worse today.  He continues to have some tachypnea but tachycardia and blood pressure are improved.  We will hold on BiPAP for now and try additional diuresis and blood pressure control but is responding well to as needed BP meds for now.  Patient has no contraindications for anticoagulation.  Will start heparin with PE remaining on the differential but unable to obtain CTA at this time due to elevated creatinine.   Discussed patient's case with [facility] to request admission. Patient and family (if present) updated with plan. Care transferred to [facility] service.  I reviewed all nursing notes, vitals, pertinent old records, EKGs, labs, imaging (as available).  ____________________________________________  FINAL CLINICAL IMPRESSION(S) / ED DIAGNOSES  Final diagnoses:  SOB (shortness of breath)  Right hip pain     MEDICATIONS GIVEN DURING THIS VISIT:  Medications  nitroGLYCERIN (NITROSTAT) SL tablet 0.4 mg (0.4 mg  Sublingual Given 03/11/20 2002)  hydrALAZINE (APRESOLINE) injection 10 mg (10 mg Intravenous Given 03/12/20 0220)  insulin aspart (novoLOG) injection 0-15 Units (0 Units Subcutaneous Not Given 03/12/20 0830)  aspirin EC tablet 81 mg (has no administration in time range)  carvedilol (COREG) tablet 25 mg (25 mg Oral Given 03/12/20 0739)  furosemide (LASIX) injection 20 mg (20 mg Intravenous Given 03/12/20 0318)    Followed by  furosemide (LASIX) injection 40 mg (has no administration in time range)  simvastatin (ZOCOR) tablet 40 mg (has no administration in time range)  sodium chloride flush (NS) 0.9 % injection 3 mL (3 mLs Intravenous Given 03/12/20 0336)  sodium chloride flush (NS) 0.9 % injection 3 mL (3 mLs Intravenous Given 03/12/20 0225)  0.9 %  sodium chloride infusion (has no administration in time range)  acetaminophen (TYLENOL) tablet 650 mg (has no administration in time range)  ondansetron (ZOFRAN) injection 4 mg (has no administration in time range)  heparin ADULT infusion 100 units/mL (25000 units/223mL sodium chloride 0.45%) (has no administration in time range)  labetalol (NORMODYNE) injection 5 mg (5 mg Intravenous Given 03/11/20 2002)  furosemide (LASIX) injection 20 mg (20 mg Intravenous Given 03/11/20 2301)  heparin bolus via infusion 6,000 Units (6,000 Units Intravenous Bolus from Bag 03/11/20 2305)  labetalol (NORMODYNE) injection 5 mg (5 mg Intravenous Given 03/12/20 0006)  potassium chloride SA (KLOR-CON) CR tablet 40 mEq (40 mEq Oral Given 03/12/20 0315)  iohexol (OMNIPAQUE) 350 MG/ML injection 140  mL (140 mLs Intravenous Contrast Given 03/12/20 0937)     Note:  This document was prepared using Dragon voice recognition software and may include unintentional dictation errors.  Alona Bene, MD, [facility] Emergency Medicine    Kirsten Mckone, Arlyss Repress, MD 03/12/20 1032

## 2020-03-11 NOTE — ED Triage Notes (Signed)
Pt believes he has covid. States he has been SOB since last week that worsened. States he has a bad hip and fell once today.

## 2020-03-11 NOTE — Progress Notes (Signed)
ANTICOAGULATION CONSULT NOTE  Pharmacy Consult for Heparin Indication: pulmonary embolus r/o  No Known Allergies  Patient Measurements: Height: 5\' 11"  (180.3 cm) Weight: 120.7 kg (266 lb 3.2 oz) IBW/kg (Calculated) : 75.3 Heparin Dosing Weight: 102 kg  Vital Signs: Temp: 98.5 F (36.9 C) (11/18 1812) Temp Source: Oral (11/18 1812) BP: 193/117 (11/18 2100) Pulse Rate: 103 (11/18 2100)  Labs: Recent Labs    03/11/20 1854  HGB 12.6*  HCT 40.7  PLT 276  CREATININE 2.11*    Estimated Creatinine Clearance: 48 mL/min (A) (by C-G formula based on SCr of 2.11 mg/dL (H)).   Medical History: Past Medical History:  Diagnosis Date  . 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 CONGESTIVE HEART FAILURE 11/12/2007  . DIABETES MELLITUS, TYPE II 11/20/2008  . HYPERLIPIDEMIA 11/12/2007  . HYPERTENSION 11/12/2007  . TACHYCARDIA 01/24/2010    Medications:  Scheduled:  . furosemide  20 mg Intravenous Once  . heparin  6,000 Units Intravenous Once    Assessment: Patient is a 67 yom that presents to the ED with c/o SOB. COVID is negative however provider has high suspicion for PE at this time. Pharmacy has been asked to dose heparin for r/o PE.   Goal of Therapy:  Heparin level 0.3-0.7 units/ml Monitor platelets by anticoagulation protocol: Yes   Plan:  - Heparin bolus 6000 untis IV x 1 dose - Heparin drip @ 1600 units/hr - Heparin level in ~ 6hours  - Monitor patient for s/s of bleeding and CBC while on heparin   68 PharmD. BCPS  03/11/2020,10:02 PM

## 2020-03-12 ENCOUNTER — Observation Stay (HOSPITAL_COMMUNITY): Payer: No Typology Code available for payment source

## 2020-03-12 ENCOUNTER — Encounter (HOSPITAL_COMMUNITY): Payer: Self-pay | Admitting: Internal Medicine

## 2020-03-12 DIAGNOSIS — I639 Cerebral infarction, unspecified: Secondary | ICD-10-CM | POA: Diagnosis not present

## 2020-03-12 DIAGNOSIS — I5023 Acute on chronic systolic (congestive) heart failure: Secondary | ICD-10-CM | POA: Diagnosis not present

## 2020-03-12 DIAGNOSIS — I6389 Other cerebral infarction: Secondary | ICD-10-CM | POA: Diagnosis not present

## 2020-03-12 DIAGNOSIS — E782 Mixed hyperlipidemia: Secondary | ICD-10-CM | POA: Diagnosis present

## 2020-03-12 DIAGNOSIS — I5021 Acute systolic (congestive) heart failure: Secondary | ICD-10-CM

## 2020-03-12 DIAGNOSIS — E876 Hypokalemia: Secondary | ICD-10-CM | POA: Diagnosis present

## 2020-03-12 DIAGNOSIS — R7989 Other specified abnormal findings of blood chemistry: Secondary | ICD-10-CM | POA: Diagnosis present

## 2020-03-12 DIAGNOSIS — R451 Restlessness and agitation: Secondary | ICD-10-CM | POA: Diagnosis not present

## 2020-03-12 DIAGNOSIS — Y92009 Unspecified place in unspecified non-institutional (private) residence as the place of occurrence of the external cause: Secondary | ICD-10-CM | POA: Diagnosis not present

## 2020-03-12 DIAGNOSIS — N179 Acute kidney failure, unspecified: Secondary | ICD-10-CM | POA: Diagnosis present

## 2020-03-12 DIAGNOSIS — I5043 Acute on chronic combined systolic (congestive) and diastolic (congestive) heart failure: Secondary | ICD-10-CM | POA: Diagnosis present

## 2020-03-12 DIAGNOSIS — W19XXXA Unspecified fall, initial encounter: Secondary | ICD-10-CM | POA: Diagnosis not present

## 2020-03-12 DIAGNOSIS — I2510 Atherosclerotic heart disease of native coronary artery without angina pectoris: Secondary | ICD-10-CM | POA: Diagnosis not present

## 2020-03-12 DIAGNOSIS — R29710 NIHSS score 10: Secondary | ICD-10-CM | POA: Diagnosis not present

## 2020-03-12 DIAGNOSIS — N189 Chronic kidney disease, unspecified: Secondary | ICD-10-CM | POA: Diagnosis present

## 2020-03-12 DIAGNOSIS — R0602 Shortness of breath: Secondary | ICD-10-CM | POA: Diagnosis present

## 2020-03-12 DIAGNOSIS — R29711 NIHSS score 11: Secondary | ICD-10-CM | POA: Diagnosis not present

## 2020-03-12 DIAGNOSIS — I161 Hypertensive emergency: Secondary | ICD-10-CM | POA: Diagnosis present

## 2020-03-12 DIAGNOSIS — R4701 Aphasia: Secondary | ICD-10-CM | POA: Diagnosis not present

## 2020-03-12 DIAGNOSIS — R29713 NIHSS score 13: Secondary | ICD-10-CM | POA: Diagnosis not present

## 2020-03-12 DIAGNOSIS — I428 Other cardiomyopathies: Secondary | ICD-10-CM | POA: Diagnosis present

## 2020-03-12 DIAGNOSIS — I130 Hypertensive heart and chronic kidney disease with heart failure and stage 1 through stage 4 chronic kidney disease, or unspecified chronic kidney disease: Secondary | ICD-10-CM | POA: Diagnosis present

## 2020-03-12 DIAGNOSIS — R29712 NIHSS score 12: Secondary | ICD-10-CM | POA: Diagnosis not present

## 2020-03-12 DIAGNOSIS — Q211 Atrial septal defect: Secondary | ICD-10-CM | POA: Diagnosis not present

## 2020-03-12 DIAGNOSIS — I340 Nonrheumatic mitral (valve) insufficiency: Secondary | ICD-10-CM | POA: Diagnosis not present

## 2020-03-12 DIAGNOSIS — W07XXXA Fall from chair, initial encounter: Secondary | ICD-10-CM | POA: Diagnosis present

## 2020-03-12 DIAGNOSIS — R471 Dysarthria and anarthria: Secondary | ICD-10-CM | POA: Diagnosis not present

## 2020-03-12 DIAGNOSIS — E1122 Type 2 diabetes mellitus with diabetic chronic kidney disease: Secondary | ICD-10-CM | POA: Diagnosis present

## 2020-03-12 DIAGNOSIS — Z20822 Contact with and (suspected) exposure to covid-19: Secondary | ICD-10-CM | POA: Diagnosis present

## 2020-03-12 DIAGNOSIS — G8191 Hemiplegia, unspecified affecting right dominant side: Secondary | ICD-10-CM | POA: Diagnosis not present

## 2020-03-12 DIAGNOSIS — I160 Hypertensive urgency: Secondary | ICD-10-CM | POA: Diagnosis present

## 2020-03-12 DIAGNOSIS — M25551 Pain in right hip: Secondary | ICD-10-CM | POA: Diagnosis present

## 2020-03-12 DIAGNOSIS — R29810 Facial weakness: Secondary | ICD-10-CM | POA: Diagnosis not present

## 2020-03-12 DIAGNOSIS — G4733 Obstructive sleep apnea (adult) (pediatric): Secondary | ICD-10-CM | POA: Diagnosis present

## 2020-03-12 LAB — D-DIMER, QUANTITATIVE: D-Dimer, Quant: 1.39 ug/mL-FEU — ABNORMAL HIGH (ref 0.00–0.50)

## 2020-03-12 LAB — LIPID PANEL
Cholesterol: 232 mg/dL — ABNORMAL HIGH (ref 0–200)
HDL: 46 mg/dL (ref >40)
LDL Cholesterol: 173 mg/dL — ABNORMAL HIGH (ref 0–99)
Total CHOL/HDL Ratio: 5 RATIO
Triglycerides: 67 mg/dL (ref <150)
VLDL: 13 mg/dL (ref 0–40)

## 2020-03-12 LAB — ECHOCARDIOGRAM COMPLETE
Area-P 1/2: 2.18 cm2
Height: 71 in
S' Lateral: 4.4 cm
Single Plane A4C EF: 37.3 %
Weight: 4017.66 oz

## 2020-03-12 LAB — URINALYSIS, COMPLETE (UACMP) WITH MICROSCOPIC
Bacteria, UA: NONE SEEN
Bilirubin Urine: NEGATIVE
Glucose, UA: NEGATIVE mg/dL
Ketones, ur: 5 mg/dL — AB
Leukocytes,Ua: NEGATIVE
Nitrite: NEGATIVE
Protein, ur: 100 mg/dL — AB
Specific Gravity, Urine: 1.008 (ref 1.005–1.030)
pH: 7 (ref 5.0–8.0)

## 2020-03-12 LAB — TROPONIN I (HIGH SENSITIVITY)
Troponin I (High Sensitivity): 129 ng/L (ref <18)
Troponin I (High Sensitivity): 112 ng/L (ref <18)

## 2020-03-12 LAB — GLUCOSE, CAPILLARY
Glucose-Capillary: 111 mg/dL — ABNORMAL HIGH (ref 70–99)
Glucose-Capillary: 133 mg/dL — ABNORMAL HIGH (ref 70–99)
Glucose-Capillary: 125 mg/dL — ABNORMAL HIGH (ref 70–99)
Glucose-Capillary: 108 mg/dL — ABNORMAL HIGH (ref 70–99)
Glucose-Capillary: 134 mg/dL — ABNORMAL HIGH (ref 70–99)

## 2020-03-12 LAB — CBC
HCT: 40.9 % (ref 39.0–52.0)
Hemoglobin: 13 g/dL (ref 13.0–17.0)
MCH: 30.5 pg (ref 26.0–34.0)
MCHC: 31.8 g/dL (ref 30.0–36.0)
MCV: 96 fL (ref 80.0–100.0)
Platelets: 295 10*3/uL (ref 150–400)
RBC: 4.26 MIL/uL (ref 4.22–5.81)
RDW: 13.7 % (ref 11.5–15.5)
WBC: 12.4 10*3/uL — ABNORMAL HIGH (ref 4.0–10.5)
nRBC: 0 % (ref 0.0–0.2)

## 2020-03-12 LAB — MAGNESIUM: Magnesium: 1.8 mg/dL (ref 1.7–2.4)

## 2020-03-12 LAB — HEPARIN LEVEL (UNFRACTIONATED)
Heparin Unfractionated: 0.68 IU/mL (ref 0.30–0.70)
Heparin Unfractionated: 0.43 IU/mL (ref 0.30–0.70)

## 2020-03-12 LAB — HIV ANTIBODY (ROUTINE TESTING W REFLEX): HIV Screen 4th Generation wRfx: NONREACTIVE

## 2020-03-12 LAB — HEMOGLOBIN A1C
Hgb A1c MFr Bld: 6.8 % — ABNORMAL HIGH (ref 4.8–5.6)
Mean Plasma Glucose: 148.46 mg/dL

## 2020-03-12 MED ORDER — TICAGRELOR 90 MG PO TABS
ORAL_TABLET | ORAL | Status: AC
  Filled 2020-03-12: qty 2

## 2020-03-12 MED ORDER — ASPIRIN 81 MG PO CHEW
CHEWABLE_TABLET | ORAL | Status: AC
  Filled 2020-03-12: qty 1

## 2020-03-12 MED ORDER — IOHEXOL 240 MG/ML SOLN
INTRAMUSCULAR | Status: AC
  Filled 2020-03-12: qty 200

## 2020-03-12 MED ORDER — HYDRALAZINE HCL 20 MG/ML IJ SOLN
10.0000 mg | Freq: Four times a day (QID) | INTRAMUSCULAR | Status: DC | PRN
  Administered 2020-03-12 – 2020-03-14 (×3): 10 mg via INTRAVENOUS
  Filled 2020-03-12 (×5): qty 1

## 2020-03-12 MED ORDER — CLOPIDOGREL BISULFATE 300 MG PO TABS
ORAL_TABLET | ORAL | Status: AC
  Filled 2020-03-12: qty 1

## 2020-03-12 MED ORDER — PERFLUTREN LIPID MICROSPHERE
1.0000 mL | INTRAVENOUS | Status: AC | PRN
  Administered 2020-03-12: 3 mL via INTRAVENOUS
  Filled 2020-03-12: qty 10

## 2020-03-12 MED ORDER — HEPARIN (PORCINE) 25000 UT/250ML-% IV SOLN
1600.0000 [IU]/h | INTRAVENOUS | Status: DC
  Administered 2020-03-12: 1600 [IU]/h via INTRAVENOUS
  Filled 2020-03-12: qty 250

## 2020-03-12 MED ORDER — CANGRELOR TETRASODIUM 50 MG IV SOLR
INTRAVENOUS | Status: AC
  Filled 2020-03-12: qty 50

## 2020-03-12 MED ORDER — IOHEXOL 350 MG/ML SOLN
140.0000 mL | Freq: Once | INTRAVENOUS | Status: AC | PRN
  Administered 2020-03-12: 140 mL via INTRAVENOUS

## 2020-03-12 MED ORDER — NITROGLYCERIN 1 MG/10 ML FOR IR/CATH LAB
INTRA_ARTERIAL | Status: AC
  Filled 2020-03-12: qty 10

## 2020-03-12 MED ORDER — SODIUM CHLORIDE 0.9 % IV SOLN: 250.0000 mL | INTRAVENOUS | Status: DC | PRN

## 2020-03-12 MED ORDER — POTASSIUM CHLORIDE CRYS ER 20 MEQ PO TBCR
40.0000 meq | EXTENDED_RELEASE_TABLET | Freq: Once | ORAL | Status: AC
  Administered 2020-03-12: 40 meq via ORAL
  Filled 2020-03-12: qty 2

## 2020-03-12 MED ORDER — VERAPAMIL HCL 2.5 MG/ML IV SOLN
INTRAVENOUS | Status: AC
  Filled 2020-03-12: qty 2

## 2020-03-12 MED ORDER — SODIUM CHLORIDE 0.9% FLUSH
3.0000 mL | INTRAVENOUS | Status: DC | PRN
  Administered 2020-03-12: 3 mL via INTRAVENOUS

## 2020-03-12 MED ORDER — CLOPIDOGREL BISULFATE 75 MG PO TABS
75.0000 mg | ORAL_TABLET | Freq: Every day | ORAL | Status: DC
  Administered 2020-03-12 – 2020-03-29 (×18): 75 mg via ORAL
  Filled 2020-03-12 (×19): qty 1

## 2020-03-12 MED ORDER — TIROFIBAN HCL IN NACL 5-0.9 MG/100ML-% IV SOLN
INTRAVENOUS | Status: AC
  Filled 2020-03-12: qty 100

## 2020-03-12 MED ORDER — CARVEDILOL 25 MG PO TABS
25.0000 mg | ORAL_TABLET | Freq: Two times a day (BID) | ORAL | Status: DC
  Administered 2020-03-12 – 2020-03-18 (×12): 25 mg via ORAL
  Filled 2020-03-12 (×13): qty 1

## 2020-03-12 MED ORDER — FUROSEMIDE 10 MG/ML IJ SOLN
40.0000 mg | Freq: Two times a day (BID) | INTRAMUSCULAR | Status: DC
  Administered 2020-03-12 – 2020-03-22 (×19): 40 mg via INTRAVENOUS
  Filled 2020-03-12 (×20): qty 4

## 2020-03-12 MED ORDER — ONDANSETRON HCL 4 MG/2ML IJ SOLN: 4.0000 mg | Freq: Four times a day (QID) | INTRAMUSCULAR | Status: DC | PRN

## 2020-03-12 MED ORDER — SIMVASTATIN 20 MG PO TABS
40.0000 mg | ORAL_TABLET | Freq: Every day | ORAL | Status: DC
  Administered 2020-03-12 – 2020-03-20 (×9): 40 mg via ORAL
  Filled 2020-03-12 (×9): qty 2

## 2020-03-12 MED ORDER — FUROSEMIDE 10 MG/ML IJ SOLN
20.0000 mg | Freq: Once | INTRAMUSCULAR | Status: AC
  Administered 2020-03-12: 20 mg via INTRAVENOUS
  Filled 2020-03-12: qty 2

## 2020-03-12 MED ORDER — ASPIRIN EC 81 MG PO TBEC
81.0000 mg | DELAYED_RELEASE_TABLET | Freq: Every day | ORAL | Status: DC
  Administered 2020-03-13 – 2020-03-29 (×17): 81 mg via ORAL
  Filled 2020-03-12 (×17): qty 1

## 2020-03-12 MED ORDER — ACETAMINOPHEN 325 MG PO TABS
650.0000 mg | ORAL_TABLET | ORAL | Status: DC | PRN
  Administered 2020-03-18 – 2020-03-27 (×10): 650 mg via ORAL
  Filled 2020-03-12 (×10): qty 2

## 2020-03-12 MED ORDER — INSULIN ASPART 100 UNIT/ML ~~LOC~~ SOLN
0.0000 [IU] | Freq: Three times a day (TID) | SUBCUTANEOUS | Status: DC
  Administered 2020-03-12 – 2020-03-13 (×5): 2 [IU] via SUBCUTANEOUS
  Administered 2020-03-14 (×2): 3 [IU] via SUBCUTANEOUS
  Administered 2020-03-15: 2 [IU] via SUBCUTANEOUS
  Administered 2020-03-15: 3 [IU] via SUBCUTANEOUS
  Administered 2020-03-16 – 2020-03-17 (×5): 2 [IU] via SUBCUTANEOUS
  Administered 2020-03-17: 3 [IU] via SUBCUTANEOUS
  Administered 2020-03-18: 2 [IU] via SUBCUTANEOUS
  Administered 2020-03-18: 3 [IU] via SUBCUTANEOUS
  Administered 2020-03-19 – 2020-03-20 (×5): 2 [IU] via SUBCUTANEOUS
  Administered 2020-03-21 – 2020-03-23 (×4): 3 [IU] via SUBCUTANEOUS
  Administered 2020-03-24: 5 [IU] via SUBCUTANEOUS
  Administered 2020-03-24: 3 [IU] via SUBCUTANEOUS
  Administered 2020-03-25: 5 [IU] via SUBCUTANEOUS
  Administered 2020-03-25 (×2): 3 [IU] via SUBCUTANEOUS
  Administered 2020-03-26 (×3): 2 [IU] via SUBCUTANEOUS
  Administered 2020-03-27: 3 [IU] via SUBCUTANEOUS
  Administered 2020-03-28: 2 [IU] via SUBCUTANEOUS
  Administered 2020-03-28: 3 [IU] via SUBCUTANEOUS
  Administered 2020-03-28: 2 [IU] via SUBCUTANEOUS

## 2020-03-12 MED ORDER — EPTIFIBATIDE 20 MG/10ML IV SOLN
INTRAVENOUS | Status: AC
  Filled 2020-03-12: qty 10

## 2020-03-12 MED ORDER — SODIUM CHLORIDE 0.9% FLUSH
3.0000 mL | Freq: Two times a day (BID) | INTRAVENOUS | Status: DC
  Administered 2020-03-12: 2.5 mL via INTRAVENOUS
  Administered 2020-03-12 – 2020-03-29 (×30): 3 mL via INTRAVENOUS

## 2020-03-12 MED ORDER — TECHNETIUM TO 99M ALBUMIN AGGREGATED
4.3000 | Freq: Once | INTRAVENOUS | Status: AC | PRN
  Administered 2020-03-12: 4.3 via INTRAVENOUS

## 2020-03-12 NOTE — Care Plan (Signed)
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 He reports shortness of breath is progressively getting worse. Patient also reports fall from his chair while watching TV resulting in right hip pain,  worse with weightbearing. Patient was found to be tachycardic and tachypneic, after an evaluation Patient was started on intravenous heparin with a concern for possible PE. VQ scan is ordered to rule out PE due to worsening renal functions. Patient was admitted for acute on chronic CHF exacerbation, suspected PE, hypertensive urgency, right hip pain. Patient was found to have right-sided weakness, slurring of his speech and  facial droop during morning round. Code stroke was called. Patient was not deemed a candidate for TPA. Exact time of symptoms unknown. CT head without abnormality in brain parenchyma. Patient was seen by neurology recommended MRI of the brain, PT/ OT consult speech consult.  Echocardiogram,  continue heparin for now.

## 2020-03-12 NOTE — Progress Notes (Signed)
Upon assessment Pt found to have ride side weakness, ride sided facial drooping, and slurred speech.  Kumar MD and rapid response nurse called to bedside to assess Pt for possible stroke.

## 2020-03-12 NOTE — Significant Event (Addendum)
Rapid Response Event Note   Reason for Call :  Concern for stroke  Initial Focused Assessment:  Pt noted to have right sided weakness, right facial droop, right complete hemianopia, and slurred speech. See NIH in flowsheet. LKW >4.5 hours, later determined to be 1730 on 03/11/2020. Initially heparin gtt was stopped, per Dr. Otelia Limes heparin does not need to be stopped for stroke work-up - Dr. Lucianne Muss made aware. Heparin gtt resumed. Pt transported to CT by primary RN, rapid response RN, and stroke response RN.  VS: T 98.57F, BP 200/116, HR 108, RR 40, SpO2 98% on room air CBG: 133  Interventions:  -Neurology consulted by Dr. Lucianne Muss. CT, CTA, CTP completed.  -Bedside swallow completed- failed, speech evaluation placed  Plan of Care:  -Q2H mNIHSS- complete NIH q12 hours  Call rapid response for additional needs  Event Summary:  MD Notified: Dr. Lucianne Muss Call Time: [phone] Arrival Time: 4158 End Time: 1000  Jennye Moccasin, RN

## 2020-03-12 NOTE — H&P (View-Only) (Signed)
Referring Physician: Dr. Lucianne Muss    Chief Complaint: Acute onset of right sided weakness, dysarthria and right hemianopsia  HPI: Trevor Anderson is an 64 y.o. male with a PMHx of obesity, DM2, CAD, systolic and diastolic CHF, HLD, HTN and medical noncompliance who presented to the [facility] ED yesterday with a 2 week history of orthopnea at night that then progressed to SOB during the day, which gradually became more severe. The SOB was worse with exertion and improved with rest. He then developed increased abdominal girth, but did not have any significant leg edema. He noted that he had developed a mild nonproductive cough attributed to a "cold" several weeks ago. He has been vaccinated for Covid. Of note, he fell out of his chair on 11/18 resulting in severe right hip pain that was worse with weightbearing.   He was tachypneic and tachycardic on presentation to the ED. There was concern for PE versus CHF exacerbation. He was started on IV heparin and Lasix was administered prior to admission.   This AM during Hospitalist rounds, he was informed by RN that the patient seemed to have significant right sided weakness. When hospitalist evaluated the patient, prominent right facial droop and dysarthria were also noted. Code Stroke was called.   On further discussion with RN taking care of the patient, at about 2 AM, during sign out from ED RN to floor RN, right sided weakness was described, per verbal report from RN this AM. Official note from admitting Hospitalist did not document weakness however. On detailed interview with the patient in CT, he states that he first started to feel weak at about 5 to 6 PM yesterday.   LSN: 5-6 PM on Thursday per patient. Best estimate for LKN is 5:30 PM. tPA Given: No: Out of the time window NIHSS: 10  Past Medical History:  Diagnosis Date  . 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 CONGESTIVE HEART FAILURE 11/12/2007  . DIABETES MELLITUS, TYPE II 11/20/2008  . HYPERLIPIDEMIA  11/12/2007  . HYPERTENSION 11/12/2007  . TACHYCARDIA 01/24/2010    Past Surgical History:  Procedure Laterality Date  . TONSILLECTOMY  1970    Family History  Problem Relation Age of Onset  . Kidney disease Mother    Social History:  reports that he has never smoked. He has never used smokeless tobacco. He reports current alcohol use. He reports that he does not use drugs.  Allergies: No Known Allergies  Medications:  Scheduled: . aspirin EC  81 mg Oral Daily  . carvedilol  25 mg Oral BID WC  . furosemide  40 mg Intravenous BID  . insulin aspart  0-15 Units Subcutaneous TID AC & HS  . simvastatin  40 mg Oral Daily  . sodium chloride flush  3 mL Intravenous Q12H   Continuous: . sodium chloride    . heparin 1,600 Units/hr (03/11/20 2301)    ROS: SOB. Right sided weakness. No other complaints currently.   Physical Examination: Blood pressure (!) 200/116, pulse (!) 108, temperature 98.5 F (36.9 C), temperature source Oral, resp. rate (!) 40, height  (1.803 m), weight 113.9 kg, SpO2 98 %.  HEENT: [place]/AT Lungs: Tachypneic with labored respirations while lying flat in CT Ext: Warm and well perfused x 4  Neurologic Examination: Mental Status: Awake and alert. Speech is severely dysarthric, but fluent in the context of limited verbal output due to SOB. Able to follow all commands. Oriented x 5.  Cranial Nerves:  II:  No visual field cut. PERRL.  III,IV, VI: No ptosis. Has left sided gaze preference but can cross to the right past midline. No nystagmus. Eyes conjugate.   V,VII: Prominent right facial droop. Facial temp sensation equal bilaterally VIII: hearing intact to voice IX,X: Pharyngeal dysarthria XI: Head preferentially rotated to the left XII: Tongue deviates to the right Motor: RUE 2/5 grip, 1-2/5 biceps, 1/5 triceps, 0 deltoid RLE 2/5 LUE and LLE 5/5 Sensory: Temp and light touch intact throughout, bilaterally. No asymmetry or extinction to DSS.  Deep Tendon  Reflexes:  1+ bilateral brachioradialis 1+ bilateral patellae Cerebellar: No gross ataxia to LUE.  Gait: Unable to assess  Results for orders placed or performed during the hospital encounter of 03/11/20 (from the past 48 hour(s))  Respiratory Panel by RT PCR (Flu A&B, Covid) - Nasopharyngeal Swab     Status: None   Collection Time: 03/11/20  6:15 PM   Specimen: Nasopharyngeal Swab; Nasopharyngeal(NP) swabs in vial transport medium  Result Value Ref Range   SARS Coronavirus 2 by RT PCR NEGATIVE NEGATIVE    Comment: (NOTE) SARS-CoV-2 target nucleic acids are NOT DETECTED.  The SARS-CoV-2 RNA is generally detectable in upper respiratoy specimens during the acute phase of infection. The lowest concentration of SARS-CoV-2 viral copies this assay can detect is 131 copies/mL. A negative result does not preclude SARS-Cov-2 infection and should not be used as the sole basis for treatment or other patient management decisions. A negative result may occur with  improper specimen collection/handling, submission of specimen other than nasopharyngeal swab, presence of viral mutation(s) within the areas targeted by this assay, and inadequate number of viral copies (<131 copies/mL). A negative result must be combined with clinical observations, patient history, and epidemiological information. The expected result is Negative.  Fact Sheet for Patients:  https://www.moore.com/  Fact Sheet for Healthcare Providers:  https://www.young.biz/  This test is no t yet approved or cleared by the [place] FDA and  has been authorized for detection and/or diagnosis of SARS-CoV-2 by FDA under an Emergency Use Authorization (EUA). This EUA will remain  in effect (meaning this test can be used) for the duration of the COVID-19 declaration under Section 564(b)(1) of the Act, 21 U.S.C. section 360bbb-3(b)(1), unless the authorization is terminated or revoked  sooner.     Influenza A by PCR NEGATIVE NEGATIVE   Influenza B by PCR NEGATIVE NEGATIVE    Comment: (NOTE) The Xpert Xpress SARS-CoV-2/FLU/RSV assay is intended as an aid in  the diagnosis of influenza from Nasopharyngeal swab specimens and  should not be used as a sole basis for treatment. Nasal washings and  aspirates are unacceptable for Xpert Xpress SARS-CoV-2/FLU/RSV  testing.  Fact Sheet for Patients: https://www.moore.com/  Fact Sheet for Healthcare Providers: https://www.young.biz/  This test is not yet approved or cleared by the [place] FDA and  has been authorized for detection and/or diagnosis of SARS-CoV-2 by  FDA under an Emergency Use Authorization (EUA). This EUA will remain  in effect (meaning this test can be used) for the duration of the  Covid-19 declaration under Section 564(b)(1) of the Act, 21  U.S.C. section 360bbb-3(b)(1), unless the authorization is  terminated or revoked. Performed at [facility], [address], [place], [place] [address]   CBC     Status: Abnormal   Collection Time: 03/11/20  6:54 PM  Result Value Ref Range   WBC 10.9 (H) 4.0 - 10.5 K/uL   RBC 4.12 (L) 4.22 - 5.81 MIL/uL   Hemoglobin 12.6 (L)  13.0 - 17.0 g/dL   HCT 75.4 39 - 52 %   MCV 98.8 80.0 - 100.0 fL   MCH 30.6 26.0 - 34.0 pg   MCHC 31.0 30.0 - 36.0 g/dL   RDW 49.2 01.0 - 07.1 %   Platelets 276 150 - 400 K/uL   nRBC 0.0 0.0 - 0.2 %    Comment: Performed at [facility], [address], [place], [place] [address]  Basic metabolic panel     Status: Abnormal   Collection Time: 03/11/20  6:54 PM  Result Value Ref Range   Sodium 141 135 - 145 mmol/L   Potassium 3.2 (L) 3.5 - 5.1 mmol/L   Chloride 107 98 - 111 mmol/L   CO2 18 (L) 22 - 32 mmol/L   Glucose, Bld 146 (H) 70 - 99 mg/dL    Comment: Glucose reference range applies only to samples taken after fasting for at least 8 hours.   BUN 23 8 - 23 mg/dL    Creatinine, Ser 8.83 (H) 0.61 - 1.24 mg/dL   Calcium 8.9 8.9 - 25.4 mg/dL   GFR, Estimated 35 (L) >60 mL/min    Comment: (NOTE) Calculated using the CKD-EPI Creatinine Equation (2021)    Anion gap 16 (H) 5 - 15    Comment: Performed at [facility], [address] [place], [place] [address]  Brain natriuretic peptide     Status: Abnormal   Collection Time: 03/11/20  6:54 PM  Result Value Ref Range   B Natriuretic Peptide 705.4 (H) 0.0 - 100.0 pg/mL    Comment: Performed at [facility], [address], [place], [place] [address]  CBG monitoring, ED     Status: Abnormal   Collection Time: 03/11/20  7:30 PM  Result Value Ref Range   Glucose-Capillary 130 (H) 70 - 99 mg/dL    Comment: Glucose reference range applies only to samples taken after fasting for at least 8 hours.  Heparin level (unfractionated)     Status: None   Collection Time: 03/12/20  2:17 AM  Result Value Ref Range   Heparin Unfractionated 0.68 0.30 - 0.70 IU/mL    Comment: (NOTE) If heparin results are below expected values, and patient dosage has  been confirmed, suggest follow up testing of antithrombin III levels. Performed at [facility], [address], [place], [place] [address]   CBC     Status: Abnormal   Collection Time: 03/12/20  2:17 AM  Result Value Ref Range   WBC 12.4 (H) 4.0 - 10.5 K/uL   RBC 4.26 4.22 - 5.81 MIL/uL   Hemoglobin 13.0 13.0 - 17.0 g/dL   HCT 80.8 39 - 52 %   MCV 96.0 80.0 - 100.0 fL   MCH 30.5 26.0 - 34.0 pg   MCHC 31.8 30.0 - 36.0 g/dL   RDW 81.1 03.1 - 59.4 %   Platelets 295 150 - 400 K/uL   nRBC 0.0 0.0 - 0.2 %    Comment: Performed at [facility], [address] [place], [place] [address]  Hemoglobin A1c     Status: Abnormal   Collection Time: 03/12/20  2:17 AM  Result Value Ref Range   Hgb A1c MFr Bld 6.8 (H) 4.8 - 5.6 %    Comment: (NOTE) Pre diabetes:          5.7%-6.4%  Diabetes:              >6.4%  Glycemic  control for   <7.0% adults  with diabetes    Mean Plasma Glucose 148.46 mg/dL    Comment: Performed at [facility], [address], [place], [place] [address]  Lipid panel     Status: Abnormal   Collection Time: 03/12/20  2:17 AM  Result Value Ref Range   Cholesterol 232 (H) 0 - 200 mg/dL   Triglycerides 67 <536 mg/dL   HDL 46 >46 mg/dL   Total CHOL/HDL Ratio 5.0 RATIO   VLDL 13 0 - 40 mg/dL   LDL Cholesterol 803 (H) 0 - 99 mg/dL    Comment:        Total Cholesterol/HDL:CHD Risk Coronary Heart Disease Risk Table                     Men   Women  1/2 Average Risk   3.4   3.3  Average Risk       5.0   4.4  2 X Average Risk   9.6   7.1  3 X Average Risk  23.4   11.0        Use the calculated Patient Ratio above and the CHD Risk Table to determine the patient's CHD Risk.        ATP III CLASSIFICATION (LDL):  <100     mg/dL   Optimal  212-248  mg/dL   Near or Above                    Optimal  130-159  mg/dL   Borderline  250-037  mg/dL   High  >048     mg/dL   Very High Performed at [facility], [address] [place], [place] [address]   D-dimer, quantitative (not at [facility])     Status: Abnormal   Collection Time: 03/12/20  2:17 AM  Result Value Ref Range   D-Dimer, Quant 1.39 (H) 0.00 - 0.50 ug/mL-FEU    Comment: (NOTE) At the manufacturer cut-off value of 0.5 g/mL FEU, this assay has a negative predictive value of 95-100%.This assay is intended for use in conjunction with a clinical pretest probability (PTP) assessment model to exclude pulmonary embolism (PE) and deep venous thrombosis (DVT) in outpatients suspected of PE or DVT. Results should be correlated with clinical presentation. Performed at [facility], [address] [place], [place] [address]   Magnesium     Status: None   Collection Time: 03/12/20  2:17 AM  Result Value Ref Range   Magnesium 1.8 1.7 - 2.4 mg/dL    Comment: Performed at [facility], [address], [place], [place] [address]  Troponin I  (High Sensitivity)     Status: Abnormal   Collection Time: 03/12/20  2:17 AM  Result Value Ref Range   Troponin I (High Sensitivity) 129 (HH) <18 ng/L    Comment: CRITICAL RESULT CALLED TO, READ BACK BY AND VERIFIED WITH: [place] A,RN 03/12/20 0316 WAYK Performed at [facility], [address], [place], [place] [address]   Urinalysis, Complete w Microscopic     Status: Abnormal   Collection Time: 03/12/20  4:44 AM  Result Value Ref Range   Color, Urine STRAW (A) YELLOW   APPearance CLEAR CLEAR   Specific Gravity, Urine 1.008 1.005 - 1.030   pH 7.0 5.0 - 8.0   Glucose, UA NEGATIVE NEGATIVE mg/dL   Hgb urine dipstick MODERATE (A) NEGATIVE   Bilirubin Urine NEGATIVE NEGATIVE  Ketones, ur 5 (A) NEGATIVE mg/dL   Protein, ur 100 (A) NEGATIVE mg/dL   Nitrite NEGATIVE NEGATIVE   Leukocytes,Ua NEGATIVE NEGATIVE   RBC / HPF 0-5 0 - 5 RBC/hpf   WBC, UA 0-5 0 - 5 WBC/hpf   Bacteria, UA NONE SEEN NONE SEEN   Hyaline Casts, UA PRESENT     Comment: Performed at [facility], [address] [place], [place] [address]  Glucose, capillary     Status: Abnormal   Collection Time: 03/12/20  8:02 AM  Result Value Ref Range   Glucose-Capillary 111 (H) 70 - 99 mg/dL    Comment: Glucose reference range applies only to samples taken after fasting for at least 8 hours.   Comment 1 Notify RN    Comment 2 Document in Chart   Glucose, capillary     Status: Abnormal   Collection Time: 03/12/20  8:33 AM  Result Value Ref Range   Glucose-Capillary 133 (H) 70 - 99 mg/dL    Comment: Glucose reference range applies only to samples taken after fasting for at least 8 hours.   DG Chest 2 View  Result Date: 03/11/2020 CLINICAL DATA:  Cough, shortness of breath EXAM: CHEST - 2 VIEW COMPARISON:  08/21/2007 FINDINGS: Cardiomegaly. No confluent opacities, effusions or edema. No acute bony abnormality. IMPRESSION: Cardiomegaly.  No acute cardiopulmonary disease. Electronically Signed   By: Kevin   Dover M.D.   On: 03/11/2020 18:53   US RENAL  Result Date: 03/12/2020 CLINICAL DATA:  Renal failure EXAM: RENAL / URINARY TRACT ULTRASOUND COMPLETE COMPARISON:  08/22/2007 FINDINGS: Right Kidney: Renal measurements: 11 x 5 x 5 cm = volume: 140 mL. Echogenicity is borderline increased based on corticomedullary differentiation. No mass or hydronephrosis visualized. Left Kidney: Renal measurements: 11 x 7 x 5 cm = volume: There is 200 mL. Echogenicity is borderline increased. No mass or hydronephrosis visualized. Bladder: Appears normal for degree of bladder distention. Other: Prominent liver echogenicity which could reflect steatosis. IMPRESSION: No hydronephrosis or renal atrophy. Electronically Signed   By: Jonathon  Watts M.D.   On: 03/12/2020 04:09   CT HIP RIGHT WO CONTRAST  Result Date: 03/12/2020 CLINICAL DATA:  Concern for hip fracture.  Inconclusive x-ray EXAM: CT OF THE RIGHT HIP WITHOUT CONTRAST TECHNIQUE: Multidetector CT imaging of the right hip was performed according to the standard protocol. Multiplanar CT image reconstructions were also generated. COMPARISON:  Radiography from yesterday FINDINGS: Bones/Joint/Cartilage No acute fracture or dislocation. Hip osteoarthritis with superolateral joint narrowing causing bone-on-bone contact and subchondral cystic formation. Degenerative marginal spurring at the hip joint which is fairly bulky. Degenerative right sacroiliac spurring. Ligaments Suboptimally assessed by CT. Muscles and Tendons No visible injury. Soft tissues No hematoma or opaque foreign body IMPRESSION: 1. No acute finding. 2. Advanced right hip osteoarthritis. Electronically Signed   By: Jonathon  Watts M.D.   On: 03/12/2020 04:07   DG HIP UNILAT WITH PELVIS 2-3 VIEWS RIGHT  Result Date: 03/11/2020 CLINICAL DATA:  Fall EXAM: DG HIP (WITH OR WITHOUT PELVIS) 2-3V RIGHT COMPARISON:  None. FINDINGS: Image quality degraded by patient body habitus. Subcapital band of sclerosis and  questionable transcortical lucency of the right femoral neck with possible impaction. Best seen on the frogleg lateral view. Femoral head remains normally located albeit severe arthrosis and bone-on-bone contact along the superior acetabular margin. Additional severe degenerative changes in the left hip as well. Both hips demonstrate extensive subcortical cystic change, sclerotic features and geode formations. More diffuse enthesopathic changes about   the hips and pelvis. Degenerative features in the lower lumbar spine. Mild soft tissue swelling noted over the lateral right hip as well as mild diffuse body wall edema. IMPRESSION: 1. Subcapital band of sclerosis and questionable transcortical lucency of the right femoral neck, suspicious for minimally impacted subcapital femur fracture. Right lateral hip swelling. 2. 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 3. Additional enthesopathic changes pelvis and degenerative changes in the spine. 4. Mild body wall edema. Electronically Signed   By: Kreg Shropshire M.D.   On: 03/11/2020 21:37    Assessment: 64 y.o. male initially presenting with worsening SOB in the context of a history of CHF, with right sided weakness starting at about 5:30 PM yesterday, most likely worsened significantly overnight. Code Stroke was called for the prominent right sided weakness, facial droop, dysarthria and left sided gaze preference.  1. CT head without acute abnormality in the brain parenchyma. However, there is equivocal hyperdensity in the left MCA.  2. CTA of head and neck reveals, on preliminary review, significant intracranial atherosclerosis and left MCA stenosis. Severe right MCA branch stenoses appreciated. Posterior circulation atherosclerotic changes also noted. No LVO is seen.  3. Stroke Risk Factors - DM2, CAD, systolic and diastolic CHF, HLD and HTN 4. The patient is not an endovascular candidate.  5. Given no LVO seen on CTA, but with cortical  findings on exam, the stroke is most likely due to a left MCA branch occlusion that is too distal to be visualized on CT and therefore would not be accessible by catheter for clot retraction.   Recommendations: 1. Benefits of continuing heparin for suspected PE outweighs the risk of hemorrhagic conversion of the probable stroke from a short term and long term mortality/morbidity standpoint.  2. Expedited MRI of the brain without contrast 3. PT consult, OT consult, Speech consult 4. Echocardiogram 5. After heparin is discontinued, will need to be started on DAPT with ASA and Plavix for secondary stroke prevention given the severe intracranial atherosclerosis seen on CTA 6. Statin 7. Risks of permissive HTN outweigh potential benefits. Would manage BP per CHF exacerbation protocol as the morbidity/mortality risk from acute CHF complications is felt to significantly outweigh the risk of stroke extension if not implementing a permissive HTN protocol.  8. Telemetry monitoring 9. Frequent neuro checks 10. PE and CHF exacerbation diagnostic pathway and management per primary team.   60 minutes spent in the emergent neurological evaluation and management of this critically ill patient  @Electronically  signed: Dr.   @[email or web address] @ 03/12/2020, 8:38 AM

## 2020-03-12 NOTE — Progress Notes (Signed)
°  Echocardiogram 2D Echocardiogram has been performed.  Trevor Anderson 03/12/2020, 10:49 AM

## 2020-03-12 NOTE — Consult Note (Signed)
Referring Physician: Dr. Lucianne Muss    Chief Complaint: Acute onset of right sided weakness, dysarthria and right hemianopsia  HPI: Trevor Anderson is an 64 y.o. male with a PMHx of obesity, DM2, CAD, systolic and diastolic CHF, HLD, HTN and medical noncompliance who presented to the [facility] ED yesterday with a 2 week history of orthopnea at night that then progressed to SOB during the day, which gradually became more severe. The SOB was worse with exertion and improved with rest. He then developed increased abdominal girth, but did not have any significant leg edema. He noted that he had developed a mild nonproductive cough attributed to a "cold" several weeks ago. He has been vaccinated for Covid. Of note, he fell out of his chair on 11/18 resulting in severe right hip pain that was worse with weightbearing.   He was tachypneic and tachycardic on presentation to the ED. There was concern for PE versus CHF exacerbation. He was started on IV heparin and Lasix was administered prior to admission.   This AM during Hospitalist rounds, he was informed by RN that the patient seemed to have significant right sided weakness. When hospitalist evaluated the patient, prominent right facial droop and dysarthria were also noted. Code Stroke was called.   On further discussion with RN taking care of the patient, at about 2 AM, during sign out from ED RN to floor RN, right sided weakness was described, per verbal report from RN this AM. Official note from admitting Hospitalist did not document weakness however. On detailed interview with the patient in CT, he states that he first started to feel weak at about 5 to 6 PM yesterday.   LSN: 5-6 PM on Thursday per patient. Best estimate for LKN is 5:30 PM. tPA Given: No: Out of the time window NIHSS: 10  Past Medical History:  Diagnosis Date  . 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 CONGESTIVE HEART FAILURE 11/12/2007  . DIABETES MELLITUS, TYPE II 11/20/2008  . HYPERLIPIDEMIA  11/12/2007  . HYPERTENSION 11/12/2007  . TACHYCARDIA 01/24/2010    Past Surgical History:  Procedure Laterality Date  . TONSILLECTOMY  1970    Family History  Problem Relation Age of Onset  . Kidney disease Mother    Social History:  reports that he has never smoked. He has never used smokeless tobacco. He reports current alcohol use. He reports that he does not use drugs.  Allergies: No Known Allergies  Medications:  Scheduled: . aspirin EC  81 mg Oral Daily  . carvedilol  25 mg Oral BID WC  . furosemide  40 mg Intravenous BID  . insulin aspart  0-15 Units Subcutaneous TID AC & HS  . simvastatin  40 mg Oral Daily  . sodium chloride flush  3 mL Intravenous Q12H   Continuous: . sodium chloride    . heparin 1,600 Units/hr (03/11/20 2301)    ROS: SOB. Right sided weakness. No other complaints currently.   Physical Examination: Blood pressure (!) 200/116, pulse (!) 108, temperature 98.5 F (36.9 C), temperature source Oral, resp. rate (!) 40, height  (1.803 m), weight 113.9 kg, SpO2 98 %.  HEENT: [place]/AT Lungs: Tachypneic with labored respirations while lying flat in CT Ext: Warm and well perfused x 4  Neurologic Examination: Mental Status: Awake and alert. Speech is severely dysarthric, but fluent in the context of limited verbal output due to SOB. Able to follow all commands. Oriented x 5.  Cranial Nerves:  II:  No visual field cut. PERRL.  III,IV, VI: No ptosis. Has left sided gaze preference but can cross to the right past midline. No nystagmus. Eyes conjugate.   V,VII: Prominent right facial droop. Facial temp sensation equal bilaterally VIII: hearing intact to voice IX,X: Pharyngeal dysarthria XI: Head preferentially rotated to the left XII: Tongue deviates to the right Motor: RUE 2/5 grip, 1-2/5 biceps, 1/5 triceps, 0 deltoid RLE 2/5 LUE and LLE 5/5 Sensory: Temp and light touch intact throughout, bilaterally. No asymmetry or extinction to DSS.  Deep Tendon  Reflexes:  1+ bilateral brachioradialis 1+ bilateral patellae Cerebellar: No gross ataxia to LUE.  Gait: Unable to assess  Results for orders placed or performed during the hospital encounter of 03/11/20 (from the past 48 hour(s))  Respiratory Panel by RT PCR (Flu A&B, Covid) - Nasopharyngeal Swab     Status: None   Collection Time: 03/11/20  6:15 PM   Specimen: Nasopharyngeal Swab; Nasopharyngeal(NP) swabs in vial transport medium  Result Value Ref Range   SARS Coronavirus 2 by RT PCR NEGATIVE NEGATIVE    Comment: (NOTE) SARS-CoV-2 target nucleic acids are NOT DETECTED.  The SARS-CoV-2 RNA is generally detectable in upper respiratoy specimens during the acute phase of infection. The lowest concentration of SARS-CoV-2 viral copies this assay can detect is 131 copies/mL. A negative result does not preclude SARS-Cov-2 infection and should not be used as the sole basis for treatment or other patient management decisions. A negative result may occur with  improper specimen collection/handling, submission of specimen other than nasopharyngeal swab, presence of viral mutation(s) within the areas targeted by this assay, and inadequate number of viral copies (<131 copies/mL). A negative result must be combined with clinical observations, patient history, and epidemiological information. The expected result is Negative.  Fact Sheet for Patients:  https://www.moore.com/  Fact Sheet for Healthcare Providers:  https://www.young.biz/  This test is no t yet approved or cleared by the [place] FDA and  has been authorized for detection and/or diagnosis of SARS-CoV-2 by FDA under an Emergency Use Authorization (EUA). This EUA will remain  in effect (meaning this test can be used) for the duration of the COVID-19 declaration under Section 564(b)(1) of the Act, 21 U.S.C. section 360bbb-3(b)(1), unless the authorization is terminated or revoked  sooner.     Influenza A by PCR NEGATIVE NEGATIVE   Influenza B by PCR NEGATIVE NEGATIVE    Comment: (NOTE) The Xpert Xpress SARS-CoV-2/FLU/RSV assay is intended as an aid in  the diagnosis of influenza from Nasopharyngeal swab specimens and  should not be used as a sole basis for treatment. Nasal washings and  aspirates are unacceptable for Xpert Xpress SARS-CoV-2/FLU/RSV  testing.  Fact Sheet for Patients: https://www.moore.com/  Fact Sheet for Healthcare Providers: https://www.young.biz/  This test is not yet approved or cleared by the [place] FDA and  has been authorized for detection and/or diagnosis of SARS-CoV-2 by  FDA under an Emergency Use Authorization (EUA). This EUA will remain  in effect (meaning this test can be used) for the duration of the  Covid-19 declaration under Section 564(b)(1) of the Act, 21  U.S.C. section 360bbb-3(b)(1), unless the authorization is  terminated or revoked. Performed at [facility], [address], [place], [place] [address]   CBC     Status: Abnormal   Collection Time: 03/11/20  6:54 PM  Result Value Ref Range   WBC 10.9 (H) 4.0 - 10.5 K/uL   RBC 4.12 (L) 4.22 - 5.81 MIL/uL   Hemoglobin 12.6 (L)  13.0 - 17.0 g/dL   HCT 75.4 39 - 52 %   MCV 98.8 80.0 - 100.0 fL   MCH 30.6 26.0 - 34.0 pg   MCHC 31.0 30.0 - 36.0 g/dL   RDW 49.2 01.0 - 07.1 %   Platelets 276 150 - 400 K/uL   nRBC 0.0 0.0 - 0.2 %    Comment: Performed at [facility], [address], [place], [place] [address]  Basic metabolic panel     Status: Abnormal   Collection Time: 03/11/20  6:54 PM  Result Value Ref Range   Sodium 141 135 - 145 mmol/L   Potassium 3.2 (L) 3.5 - 5.1 mmol/L   Chloride 107 98 - 111 mmol/L   CO2 18 (L) 22 - 32 mmol/L   Glucose, Bld 146 (H) 70 - 99 mg/dL    Comment: Glucose reference range applies only to samples taken after fasting for at least 8 hours.   BUN 23 8 - 23 mg/dL    Creatinine, Ser 8.83 (H) 0.61 - 1.24 mg/dL   Calcium 8.9 8.9 - 25.4 mg/dL   GFR, Estimated 35 (L) >60 mL/min    Comment: (NOTE) Calculated using the CKD-EPI Creatinine Equation (2021)    Anion gap 16 (H) 5 - 15    Comment: Performed at [facility], [address] [place], [place] [address]  Brain natriuretic peptide     Status: Abnormal   Collection Time: 03/11/20  6:54 PM  Result Value Ref Range   B Natriuretic Peptide 705.4 (H) 0.0 - 100.0 pg/mL    Comment: Performed at [facility], [address], [place], [place] [address]  CBG monitoring, ED     Status: Abnormal   Collection Time: 03/11/20  7:30 PM  Result Value Ref Range   Glucose-Capillary 130 (H) 70 - 99 mg/dL    Comment: Glucose reference range applies only to samples taken after fasting for at least 8 hours.  Heparin level (unfractionated)     Status: None   Collection Time: 03/12/20  2:17 AM  Result Value Ref Range   Heparin Unfractionated 0.68 0.30 - 0.70 IU/mL    Comment: (NOTE) If heparin results are below expected values, and patient dosage has  been confirmed, suggest follow up testing of antithrombin III levels. Performed at [facility], [address], [place], [place] [address]   CBC     Status: Abnormal   Collection Time: 03/12/20  2:17 AM  Result Value Ref Range   WBC 12.4 (H) 4.0 - 10.5 K/uL   RBC 4.26 4.22 - 5.81 MIL/uL   Hemoglobin 13.0 13.0 - 17.0 g/dL   HCT 80.8 39 - 52 %   MCV 96.0 80.0 - 100.0 fL   MCH 30.5 26.0 - 34.0 pg   MCHC 31.8 30.0 - 36.0 g/dL   RDW 81.1 03.1 - 59.4 %   Platelets 295 150 - 400 K/uL   nRBC 0.0 0.0 - 0.2 %    Comment: Performed at [facility], [address] [place], [place] [address]  Hemoglobin A1c     Status: Abnormal   Collection Time: 03/12/20  2:17 AM  Result Value Ref Range   Hgb A1c MFr Bld 6.8 (H) 4.8 - 5.6 %    Comment: (NOTE) Pre diabetes:          5.7%-6.4%  Diabetes:              >6.4%  Glycemic  control for   <7.0% adults  with diabetes    Mean Plasma Glucose 148.46 mg/dL    Comment: Performed at [facility], [address], [place], [place] [address]  Lipid panel     Status: Abnormal   Collection Time: 03/12/20  2:17 AM  Result Value Ref Range   Cholesterol 232 (H) 0 - 200 mg/dL   Triglycerides 67 <536 mg/dL   HDL 46 >46 mg/dL   Total CHOL/HDL Ratio 5.0 RATIO   VLDL 13 0 - 40 mg/dL   LDL Cholesterol 803 (H) 0 - 99 mg/dL    Comment:        Total Cholesterol/HDL:CHD Risk Coronary Heart Disease Risk Table                     Men   Women  1/2 Average Risk   3.4   3.3  Average Risk       5.0   4.4  2 X Average Risk   9.6   7.1  3 X Average Risk  23.4   11.0        Use the calculated Patient Ratio above and the CHD Risk Table to determine the patient's CHD Risk.        ATP III CLASSIFICATION (LDL):  <100     mg/dL   Optimal  212-248  mg/dL   Near or Above                    Optimal  130-159  mg/dL   Borderline  250-037  mg/dL   High  >048     mg/dL   Very High Performed at [facility], [address] [place], [place] [address]   D-dimer, quantitative (not at [facility])     Status: Abnormal   Collection Time: 03/12/20  2:17 AM  Result Value Ref Range   D-Dimer, Quant 1.39 (H) 0.00 - 0.50 ug/mL-FEU    Comment: (NOTE) At the manufacturer cut-off value of 0.5 g/mL FEU, this assay has a negative predictive value of 95-100%.This assay is intended for use in conjunction with a clinical pretest probability (PTP) assessment model to exclude pulmonary embolism (PE) and deep venous thrombosis (DVT) in outpatients suspected of PE or DVT. Results should be correlated with clinical presentation. Performed at [facility], [address] [place], [place] [address]   Magnesium     Status: None   Collection Time: 03/12/20  2:17 AM  Result Value Ref Range   Magnesium 1.8 1.7 - 2.4 mg/dL    Comment: Performed at [facility], [address], [place], [place] [address]  Troponin I  (High Sensitivity)     Status: Abnormal   Collection Time: 03/12/20  2:17 AM  Result Value Ref Range   Troponin I (High Sensitivity) 129 (HH) <18 ng/L    Comment: CRITICAL RESULT CALLED TO, READ BACK BY AND VERIFIED WITH: [place] A,RN 03/12/20 0316 WAYK Performed at [facility], [address], [place], [place] [address]   Urinalysis, Complete w Microscopic     Status: Abnormal   Collection Time: 03/12/20  4:44 AM  Result Value Ref Range   Color, Urine STRAW (A) YELLOW   APPearance CLEAR CLEAR   Specific Gravity, Urine 1.008 1.005 - 1.030   pH 7.0 5.0 - 8.0   Glucose, UA NEGATIVE NEGATIVE mg/dL   Hgb urine dipstick MODERATE (A) NEGATIVE   Bilirubin Urine NEGATIVE NEGATIVE  Ketones, ur 5 (A) NEGATIVE mg/dL   Protein, ur 782 (A) NEGATIVE mg/dL   Nitrite NEGATIVE NEGATIVE   Leukocytes,Ua NEGATIVE NEGATIVE   RBC / HPF 0-5 0 - 5 RBC/hpf   WBC, UA 0-5 0 - 5 WBC/hpf   Bacteria, UA NONE SEEN NONE SEEN   Hyaline Casts, UA PRESENT     Comment: Performed at [facility], [address] [place], [place] [address]  Glucose, capillary     Status: Abnormal   Collection Time: 03/12/20  8:02 AM  Result Value Ref Range   Glucose-Capillary 111 (H) 70 - 99 mg/dL    Comment: Glucose reference range applies only to samples taken after fasting for at least 8 hours.   Comment 1 Notify RN    Comment 2 Document in Chart   Glucose, capillary     Status: Abnormal   Collection Time: 03/12/20  8:33 AM  Result Value Ref Range   Glucose-Capillary 133 (H) 70 - 99 mg/dL    Comment: Glucose reference range applies only to samples taken after fasting for at least 8 hours.   DG Chest 2 View  Result Date: 03/11/2020 CLINICAL DATA:  Cough, shortness of breath EXAM: CHEST - 2 VIEW COMPARISON:  08/21/2007 FINDINGS: Cardiomegaly. No confluent opacities, effusions or edema. No acute bony abnormality. IMPRESSION: Cardiomegaly.  No acute cardiopulmonary disease. Electronically Signed   By: Charlett Nose M.D.   On: 03/11/2020 18:53   US RENAL  Result Date: 03/12/2020 CLINICAL DATA:  Renal failure EXAM: RENAL / URINARY TRACT ULTRASOUND COMPLETE COMPARISON:  08/22/2007 FINDINGS: Right Kidney: Renal measurements: 11 x 5 x 5 cm = volume: 140 mL. Echogenicity is borderline increased based on corticomedullary differentiation. No mass or hydronephrosis visualized. Left Kidney: Renal measurements: 11 x 7 x 5 cm = volume: There is 200 mL. Echogenicity is borderline increased. No mass or hydronephrosis visualized. Bladder: Appears normal for degree of bladder distention. Other: Prominent liver echogenicity which could reflect steatosis. IMPRESSION: No hydronephrosis or renal atrophy. Electronically Signed   By: Marnee Spring M.D.   On: 03/12/2020 04:09   CT HIP RIGHT WO CONTRAST  Result Date: 03/12/2020 CLINICAL DATA:  Concern for hip fracture.  Inconclusive x-ray EXAM: CT OF THE RIGHT HIP WITHOUT CONTRAST TECHNIQUE: Multidetector CT imaging of the right hip was performed according to the standard protocol. Multiplanar CT image reconstructions were also generated. COMPARISON:  Radiography from yesterday FINDINGS: Bones/Joint/Cartilage No acute fracture or dislocation. Hip osteoarthritis with superolateral joint narrowing causing bone-on-bone contact and subchondral cystic formation. Degenerative marginal spurring at the hip joint which is fairly bulky. Degenerative right sacroiliac spurring. Ligaments Suboptimally assessed by CT. Muscles and Tendons No visible injury. Soft tissues No hematoma or opaque foreign body IMPRESSION: 1. No acute finding. 2. Advanced right hip osteoarthritis. Electronically Signed   By: Marnee Spring M.D.   On: 03/12/2020 04:07   DG HIP UNILAT WITH PELVIS 2-3 VIEWS RIGHT  Result Date: 03/11/2020 CLINICAL DATA:  Fall EXAM: DG HIP (WITH OR WITHOUT PELVIS) 2-3V RIGHT COMPARISON:  None. FINDINGS: Image quality degraded by patient body habitus. Subcapital band of sclerosis and  questionable transcortical lucency of the right femoral neck with possible impaction. Best seen on the frogleg lateral view. Femoral head remains normally located albeit severe arthrosis and bone-on-bone contact along the superior acetabular margin. Additional severe degenerative changes in the left hip as well. Both hips demonstrate extensive subcortical cystic change, sclerotic features and geode formations. More diffuse enthesopathic changes about  the hips and pelvis. Degenerative features in the lower lumbar spine. Mild soft tissue swelling noted over the lateral right hip as well as mild diffuse body wall edema. IMPRESSION: 1. Subcapital band of sclerosis and questionable transcortical lucency of the right femoral neck, suspicious for minimally impacted subcapital femur fracture. Right lateral hip swelling. 2. 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 3. Additional enthesopathic changes pelvis and degenerative changes in the spine. 4. Mild body wall edema. Electronically Signed   By: Kreg Shropshire M.D.   On: 03/11/2020 21:37    Assessment: 64 y.o. male initially presenting with worsening SOB in the context of a history of CHF, with right sided weakness starting at about 5:30 PM yesterday, most likely worsened significantly overnight. Code Stroke was called for the prominent right sided weakness, facial droop, dysarthria and left sided gaze preference.  1. CT head without acute abnormality in the brain parenchyma. However, there is equivocal hyperdensity in the left MCA.  2. CTA of head and neck reveals, on preliminary review, significant intracranial atherosclerosis and left MCA stenosis. Severe right MCA branch stenoses appreciated. Posterior circulation atherosclerotic changes also noted. No LVO is seen.  3. Stroke Risk Factors - DM2, CAD, systolic and diastolic CHF, HLD and HTN 4. The patient is not an endovascular candidate.  5. Given no LVO seen on CTA, but with cortical  findings on exam, the stroke is most likely due to a left MCA branch occlusion that is too distal to be visualized on CT and therefore would not be accessible by catheter for clot retraction.   Recommendations: 1. Benefits of continuing heparin for suspected PE outweighs the risk of hemorrhagic conversion of the probable stroke from a short term and long term mortality/morbidity standpoint.  2. Expedited MRI of the brain without contrast 3. PT consult, OT consult, Speech consult 4. Echocardiogram 5. After heparin is discontinued, will need to be started on DAPT with ASA and Plavix for secondary stroke prevention given the severe intracranial atherosclerosis seen on CTA 6. Statin 7. Risks of permissive HTN outweigh potential benefits. Would manage BP per CHF exacerbation protocol as the morbidity/mortality risk from acute CHF complications is felt to significantly outweigh the risk of stroke extension if not implementing a permissive HTN protocol.  8. Telemetry monitoring 9. Frequent neuro checks 10. PE and CHF exacerbation diagnostic pathway and management per primary team.   60 minutes spent in the emergent neurological evaluation and management of this critically ill patient  @Electronically  signed: Dr.   @[email or web address] @ 03/12/2020, 8:38 AM

## 2020-03-12 NOTE — Progress Notes (Signed)
Dr. Lucianne Muss aware of elevated BP and red MEWS.  He is making round and will be at bedside to evaluate pt this morning.  Received verbal order to administer PRN hydralazine now.

## 2020-03-12 NOTE — Evaluation (Signed)
Clinical/Bedside Swallow Evaluation Patient Details  Name: Trevor Anderson MRN: [id number] Date of Birth: Jun 05, 1955  Today's Date: 03/12/2020 Time: SLP Start Time (ACUTE ONLY): 1130 SLP Stop Time (ACUTE ONLY): 1145 SLP Time Calculation (min) (ACUTE ONLY): 15 min  Past Medical History:  Past Medical History:  Diagnosis Date  . 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 CONGESTIVE HEART FAILURE 11/12/2007  . DIABETES MELLITUS, TYPE II 11/20/2008  . HYPERLIPIDEMIA 11/12/2007  . HYPERTENSION 11/12/2007  . TACHYCARDIA 01/24/2010   Past Surgical History:  Past Surgical History:  Procedure Laterality Date  . TONSILLECTOMY  [address]31   HPI:  64 year old male with past medical history of diabetes mellitus type 2, coronary artery disease (cath 2009 with nonobstructive disease), systolic and diastolic congestive heart failure, hyperlipidemia, hypertension and medical noncompliance who presented to [facility] emergency department 11/18 with complaints of shortness of breath. Code stroke called am 11/19 due to acute right sided weakness and dysarthria.  W/u pending. Failed RN stroke swallow screen.    Assessment / Plan / Recommendation Clinical Impression  Pt participated in clinical swallowing assessment after acute neuro changes this morning. He demonstrates right lower face assymetry (CN VII), tongue with significant deviation to right upon extension (CN XII). Speech is dysarthric.  Pt demonstrates adequate attention/reception of purees with no oral residue post-swallow and no s/s of aspiration.  He had difficulty drawing water from a straw, and took several sips with the appearance of a delayed swallow and several sub-swallows per bolus.  There was no overt coughing. Given degree of CN involvement and concerns for potential silent aspiration, recommend holding POs today except for critical meds crushed in puree and occasional ice chips. D/W RN and pt's son, who was at bedside.  SLP will f/u for plan of  care (potential for thrombectomy) and readiness for PO diet vs the need for MBS.  SLP Visit Diagnosis: Dysphagia, oropharyngeal phase (R13.12)    Aspiration Risk       Diet Recommendation   npo except CRITICAL meds crushed and occasional ice chips after oral care  Medication Administration: Crushed with puree    Other  Recommendations Oral Care Recommendations: Oral care QID   Follow up Recommendations Other (comment) (tba)      Frequency and Duration min 2x/week  1 week       Prognosis        Swallow Study   General Date of Onset: 03/12/20 HPI: 64 year old male with past medical history of diabetes mellitus type 2, coronary artery disease (cath 2009 with nonobstructive disease), systolic and diastolic congestive heart failure, hyperlipidemia, hypertension and medical noncompliance who presented to [facility] emergency department 11/18 with complaints of shortness of breath. Code stroke called am 11/19 due to acute right sided weakness and dysarthria.  W/u pending. Failed RN stroke swallow screen.  Type of Study: Bedside Swallow Evaluation Previous Swallow Assessment: no Diet Prior to this Study: NPO Temperature Spikes Noted: No Respiratory Status: Nasal cannula History of Recent Intubation: No Behavior/Cognition: Alert;Cooperative Oral Cavity Assessment: Within Functional Limits Oral Care Completed by SLP: Recent completion by staff Vision: Functional for self-feeding Self-Feeding Abilities: Needs assist Patient Positioning: Upright in bed Baseline Vocal Quality: Normal Volitional Cough: Weak Volitional Swallow: Able to elicit    Oral/Motor/Sensory Function Overall Oral Motor/Sensory Function: Moderate impairment Facial ROM: Reduced right Facial Symmetry: Abnormal symmetry right;Suspected CN VII (facial) dysfunction Facial Strength: Reduced right Lingual ROM: Reduced right Lingual Symmetry: Abnormal symmetry right;Suspected CN XII (hypoglossal)  dysfunction Lingual Strength: Reduced;Suspected  CN XII (hypoglossal) dysfunction   Ice Chips Ice chips: Within functional limits   Thin Liquid Thin Liquid: Impaired Presentation: Cup;Straw Oral Phase Functional Implications: Right anterior spillage Pharyngeal  Phase Impairments: Multiple swallows;Suspected delayed Swallow    Nectar Thick Nectar Thick Liquid: Not tested   Honey Thick Honey Thick Liquid: Not tested   Puree Puree: Within functional limits   Solid     Solid: Not tested      Blenda Mounts Laurice 03/12/2020,1:32 PM  Marchelle Folks L. Samson Frederic, MA CCC/SLP Acute Rehabilitation Services Office number [phone] Pager [phone]

## 2020-03-12 NOTE — H&P (Signed)
History and Physical    Trevor Anderson [name]:[id number] DOB: 1956-02-20 DOA: 03/11/2020  PCP: [name], MD  Patient coming from: Home   Chief Complaint:  Chief Complaint  Patient presents with  . Shortness of Breath     HPI:    64 year old male with past medical history of diabetes mellitus type 2, coronary artery disease (cath 2009 with nonobstructive disease), systolic and diastolic congestive heart failure (echo 2016 EF 55-60% with diastolic dysfunction), hyperlipidemia, hypertension and medical noncompliance who presents to [facility] emergency department with complaints of shortness of breath.  Patient explains that approximately 2 weeks ago he noticed that he began to experience shortness of breath whenever lying flat.  He also noticed that he would have to wake up multiple times throughout the evening to reposition himself due to sudden development of shortness of breath.  Approximately 1 week ago, patient began to develop shortness of breath during the day.  The shortness of breath was initially mild in intensity but progressively became more and more severe.  Shortness of breath was worse with exertion and improved with rest.  As patient symptoms progressed patient also developed associated increasing abdominal girth.  Patient denies any significant leg edema however.  Patient also complains of some mild associated nonproductive cough although he attributes this to a "cold" that he developed several weeks ago.  Of note, patient has been vaccinated for COVID-19.  Patient symptoms continue to worsen.  On the morning of 11/18 patient states that he fell out of his chair while watching TV, resulting in severe right hip pain, sharp in quality, nonradiating, worse with weightbearing.  Because of the patient's progressively worsening shortness of breath patient eventually presented to [facility] emergency department for evaluation.  Upon evaluation in the emergency room  patient was found to be tachypneic and tachycardic.  After an evaluation by the emergency department staff there was concern for possible acute congestive heart failure versus acute pulmonary embolism.  Patient was initiated on intravenous heparin infusion by the emergency department staff as well as being provided 20 mg of intravenous Lasix.  Hospitalist group was then called to assess the patient for admission to the hospital.  Review of Systems:   Review of Systems  Respiratory: Positive for cough and shortness of breath.   All other systems reviewed and are negative.   Past Medical History:  Diagnosis Date  . 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 CONGESTIVE HEART FAILURE 11/12/2007  . DIABETES MELLITUS, TYPE II 11/20/2008  . HYPERLIPIDEMIA 11/12/2007  . HYPERTENSION 11/12/2007  . TACHYCARDIA 01/24/2010    Past Surgical History:  Procedure Laterality Date  . TONSILLECTOMY  1970     reports that he has never smoked. He has never used smokeless tobacco. He reports current alcohol use. He reports that he does not use drugs.  No Known Allergies  Family History  Problem Relation Age of Onset  . Kidney disease Mother      Prior to Admission medications   Medication Sig Start Date End Date Taking? Authorizing Provider  amLODipine (NORVASC) 5 MG tablet TAKE 1 TABLET BY MOUTH ONCE DAILY Patient taking differently: Take 5 mg by mouth daily.  05/07/17  Yes [name], MD  aspirin 81 MG tablet Take 81 mg by mouth daily.     Yes [provider]  carvedilol (COREG) 25 MG tablet TAKE 1 TABLET BY MOUTH TWICE DAILY WITH MEALS Patient taking differently: Take 25 mg by mouth 2 (two) times  daily with a meal.  06/11/17  Yes [name], MD  furosemide (LASIX) 40 MG tablet TAKE 1 TABLET BY MOUTH ONCE DAILY Patient taking differently: Take 40 mg by mouth daily.  07/30/17  Yes [name], MD  hydrALAZINE (APRESOLINE) 25 MG tablet TAKE 1 TABLET BY MOUTH THREE TIMES DAILY Patient taking  differently: Take 25 mg by mouth 3 (three) times daily.  05/02/18  Yes [name], MD  isosorbide mononitrate (IMDUR) 60 MG 24 hr tablet TAKE 1 TABLET BY MOUTH ONCE DAILY Patient taking differently: Take 60 mg by mouth daily.  07/30/17  Yes [name], MD  KLOR-CON M20 20 MEQ tablet TAKE 1 TABLET BY MOUTH ONCE DAILY Patient taking differently: Take 20 mEq by mouth daily.  07/30/17  Yes [name], MD  lisinopril (PRINIVIL,ZESTRIL) 20 MG tablet TAKE 1 TABLET BY MOUTH ONCE DAILY Patient taking differently: Take 20 mg by mouth daily.  07/16/17  Yes [name], MD  metFORMIN (GLUCOPHAGE) 500 MG tablet TAKE 1 TABLET BY MOUTH ONCE DAILY Patient taking differently: Take 500 mg by mouth daily with breakfast.  07/23/17  Yes [name], MD  simvastatin (ZOCOR) 40 MG tablet TAKE 1 TABLET BY MOUTH AT BEDTIME, NEED DR VISIT FOR MORE REFILLS Patient taking differently: Take 40 mg by mouth every morning.  12/25/17  Yes [name], MD  Lancets MISC 1 application by Does not apply route daily. 05/29/11   [name], MD    Physical Exam: Vitals:   03/11/20 2330 03/12/20 0000 03/12/20 0030 03/12/20 0115  BP: (!) 202/138 (!) 196/133 (!) 181/159 (!) 159/134  Pulse: (!) 103 (!) 102 (!) 105 (!) 109  Resp: (!) 37 (!) 25 (!) 25 (!) 23  Temp:      TempSrc:      SpO2: 97% 94% 90% 97%  Weight:      Height:        Constitutional: Acute alert and oriented x3, patient is in respiratory distress Skin: no rashes, no lesions, good skin turgor noted. Eyes: Pupils are equally reactive to light.  No evidence of scleral icterus or conjunctival pallor.  ENMT: Moist mucous membranes noted.  Posterior pharynx clear of any exudate or lesions.   Neck: normal, supple, no masses, no thyromegaly.  No evidence of jugular venous distension.   Respiratory: Faint bibasilar rales noted.  No evidence of wheezing.  Patient is notably tachypneic without evidence of accessory muscle use.  Cardiovascular: Tachycardic rate with  regular rhythm, no murmurs / rubs / gallops.  +1 edema of the distal bilateral lower extremities.  2+ pedal pulses. No carotid bruits.  Chest:   Nontender without crepitus or deformity.   Back:   Nontender without crepitus or deformity. Abdomen: Markedly protuberant abdomen that is soft and nontender.  No evidence of intra-abdominal masses.  Positive bowel sounds noted in all quadrants.   Musculoskeletal: No joint deformity upper and lower extremities. Good ROM, no contractures. Normal muscle tone.  Neurologic: CN 2-12 grossly intact. Sensation intact.  Patient moving all 4 extremities spontaneously.  Patient is following all commands.  Patient is responsive to verbal stimuli.   Psychiatric: Patient exhibits normal mood with appropriate affect.  Patient seems to possess insight as to their current situation.     Labs on Admission: I have personally reviewed following labs and imaging studies -   CBC: Recent Labs  Lab 03/11/20 1854  WBC 10.9*  HGB 12.6*  HCT 40.7  MCV 98.8  PLT 276   Basic Metabolic Panel: Recent Labs  Lab 03/11/20 1854  NA 141  K 3.2*  CL 107  CO2 18*  GLUCOSE 146*  BUN 23  CREATININE 2.11*  CALCIUM 8.9   GFR: Estimated Creatinine Clearance: 48 mL/min (A) (by C-G formula based on SCr of 2.11 mg/dL (H)). Liver Function Tests: No results for input(s): AST, ALT, ALKPHOS, BILITOT, PROT, ALBUMIN in the last 168 hours. No results for input(s): LIPASE, AMYLASE in the last 168 hours. No results for input(s): AMMONIA in the last 168 hours. Coagulation Profile: No results for input(s): INR, PROTIME in the last 168 hours. Cardiac Enzymes: No results for input(s): CKTOTAL, CKMB, CKMBINDEX, TROPONINI in the last 168 hours. BNP (last 3 results) No results for input(s): PROBNP in the last 8760 hours. HbA1C: No results for input(s): HGBA1C in the last 72 hours. CBG: Recent Labs  Lab 03/11/20 1930  GLUCAP 130*   Lipid Profile: No results for input(s): CHOL, HDL,  LDLCALC, TRIG, CHOLHDL, LDLDIRECT in the last 72 hours. Thyroid Function Tests: No results for input(s): TSH, T4TOTAL, FREET4, T3FREE, THYROIDAB in the last 72 hours. Anemia Panel: No results for input(s): VITAMINB12, FOLATE, FERRITIN, TIBC, IRON, RETICCTPCT in the last 72 hours. Urine analysis:    Component Value Date/Time   COLORURINE YELLOW 05/03/2017 1231   APPEARANCEUR CLEAR 05/03/2017 1231   LABSPEC 1.025 05/03/2017 1231   PHURINE 6.0 05/03/2017 1231   GLUCOSEU NEGATIVE 05/03/2017 1231   HGBUR NEGATIVE 05/03/2017 1231   BILIRUBINUR SMALL (A) 05/03/2017 1231   KETONESUR NEGATIVE 05/03/2017 1231   PROTEINUR 100 (A) 08/21/2007 1200   UROBILINOGEN 0.2 05/03/2017 1231   NITRITE NEGATIVE 05/03/2017 1231   LEUKOCYTESUR NEGATIVE 05/03/2017 1231    Radiological Exams on Admission - Personally Reviewed: DG Chest 2 View  Result Date: 03/11/2020 CLINICAL DATA:  Cough, shortness of breath EXAM: CHEST - 2 VIEW COMPARISON:  08/21/2007 FINDINGS: Cardiomegaly. No confluent opacities, effusions or edema. No acute bony abnormality. IMPRESSION: Cardiomegaly.  No acute cardiopulmonary disease. Electronically Signed   By: Charlett Nose M.D.   On: 03/11/2020 18:53   DG HIP UNILAT WITH PELVIS 2-3 VIEWS RIGHT  Result Date: 03/11/2020 CLINICAL DATA:  Fall EXAM: DG HIP (WITH OR WITHOUT PELVIS) 2-3V RIGHT COMPARISON:  None. FINDINGS: Image quality degraded by patient body habitus. Subcapital band of sclerosis and questionable transcortical lucency of the right femoral neck with possible impaction. Best seen on the frogleg lateral view. Femoral head remains normally located albeit severe arthrosis and bone-on-bone contact along the superior acetabular margin. Additional severe degenerative changes in the left hip as well. Both hips demonstrate extensive subcortical cystic change, sclerotic features and geode formations. More diffuse enthesopathic changes about the hips and pelvis. Degenerative features in the  lower lumbar spine. Mild soft tissue swelling noted over the lateral right hip as well as mild diffuse body wall edema. IMPRESSION: 1. Subcapital band of sclerosis and questionable transcortical lucency of the right femoral neck, suspicious for minimally impacted subcapital femur fracture. Right lateral hip swelling. 2. 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 3. Additional enthesopathic changes pelvis and degenerative changes in the spine. 4. Mild body wall edema. Electronically Signed   By: Kreg Shropshire M.D.   On: 03/11/2020 21:37    EKG: Personally reviewed.  Rhythm is sinus tachycardia with heart rate of 125 bpm.  Evidence of left ventricular hypertrophy.  No dynamic ST segment changes appreciated.  Assessment/Plan Principal Problem:  Acute on chronic systolic CHF (congestive heart failure) (HCC)   Patient presenting with 2-week history of paroxysmal nocturnal dyspnea and increasing abdominal girth with 1 week history of progressively worsening shortness of breath  Upon evaluation here patient had a markedly elevated BNP of 705.    Patient also known to have systolic and diastolic congestive heart failure and has been noncompliant with his antihypertensives likely precipitating acute failure.  Placing patient on scheduled intravenous Lasix 40 mg every 12.  Strict input and output monitoring  Monitoring renal function electrolytes with serial chemistries  Monitoring patient on telemetry  Obtaining echocardiogram in the morning  Due to ongoing tachycardia and relatively clear chest x-ray emergency department provider was also concerned about possible thromboembolic disease and placed the patient on heparin infusion.  We will continue the heparin infusion for now and obtain a VQ scan in the morning after which heparin will be discontinued if negative. Active Problems:   Hypertensive urgency   Patient presenting with substantial hypertensive urgency with  blood pressures as high as 213/151.  Nursing reports that blood pressures in the emergency department have ranged even higher than that approaching 250 systolic even though I do not see them in epic.  Patient admits to "not always taking my blood pressure medications".  When asked where he gets his antihypertensives, he states that he gets them from Dr. Jonny Ruiz.  Per review of our records patient has not seen Dr. Jonny Ruiz since January 2019.  Providing patient with as needed intravenous antihypertensives for systolic blood pressures above 200 at this time.  Targeting 25% blood pressure reduction overnight, around a systolic blood pressure of 180.  We will slowly restart oral antihypertensives tomorrow morning.  We will start with Coreg and add additional antihypertensives as filled appropriate  Placing patient in progressive unit for close monitoring    AKI (acute kidney injury) [facility])  Patient presenting with creatinine of 2.11, an increase from 1.19 2 years ago  Because of patient's lack of outpatient follow-up it is unclear as to whether or not this elevated creatinine is due to acute kidney injury or progression of chronic kidney disease in the setting of uncontrolled blood pressure.  We will manage this by initially attempting to slowly manage blood pressure via intravenous and oral antihypertensives  Avoiding intravenous pharmacist patient due to concerns for acute congestive heart failure.  Strict input and output monitoring  Monitoring renal function and electrolytes with serial chemistries  Obtaining urinalysis and renal ultrasound    Fall at home, initial encounter  Patient reports suffering a fall at home earlier in the day on 11/18.  Patient complains of right hip pain however upon isolation of right hip joint on examination pain is not necessarily reproducible  X-ray of the right hip reveals a possible femur fracture -however upon discussion of these images between the  emergency department provider and Dr. Cyndia Bent orthopedic surgery disagrees with this read and recommends more advanced imaging.  If more advanced imaging does indeed confirm a hip fracture and orthopedic surgery can be consulted in the morning.    Right hip pain   Please see assessment and plan above    Type 2 diabetes mellitus without complication, with long-term current use of insulin (HCC)   Accu-Cheks before every meal and nightly with signs scale insulin  Hemoglobin A1c pending  Holding home regimen of Metformin although I do not think patient is consistently taking this anyway.    Coronary artery disease involving native coronary artery of  native heart without angina pectoris   Placing patient on aspirin and beta-blocker  Patient is chest pain-free  Monitoring patient on telemetry   EKG reveals no evidence of dynamic ST segment changes  Troponin pending  Lipid panel pending and will initiate statin if greater than 70.    Mixed hyperlipidemia    Question whether patient is actually taking statin at this time.  Patient states that he inconsistently takes any medications.  Obtaining lipid panel for the morning, will resume statin therapy if LDL is greater than 70.   Code Status:  Full code Family Communication: Son is at bedside and has been updated on plan of care  Status is: Observation  The patient remains OBS appropriate and will d/c before 2 midnights.  Dispo: The patient is from: Home              Anticipated d/c is to: Home              Anticipated d/c date is: 2 days              Patient currently is not medically stable to d/c.        Marinda Elk MD [facility] Pager [phone]  If 7PM-7AM, please contact night-coverage [email or web address] Use universal [facility] password for that web site. If you do not have the password, please call the hospital operator.  03/12/2020, 1:48 AM

## 2020-03-12 NOTE — Progress Notes (Signed)
ANTICOAGULATION CONSULT NOTE  Pharmacy Consult for Heparin Indication: pulmonary embolus r/o  No Known Allergies  Patient Measurements: Height: 5\' 11"  (180.3 cm) Weight: 113.9 kg (251 lb 1.7 oz) IBW/kg (Calculated) : 75.3 Heparin Dosing Weight: 102 kg  Vital Signs: Temp: 98.5 F (36.9 C) (11/19 0211) Temp Source: Oral (11/19 0211) BP: 199/136 (11/19 0216) Pulse Rate: 108 (11/19 0216)  Labs: Recent Labs    03/11/20 1854 03/12/20 0217  HGB 12.6* 13.0  HCT 40.7 40.9  PLT 276 295  HEPARINUNFRC  --  0.68  CREATININE 2.11*  --   TROPONINIHS  --  129*    Estimated Creatinine Clearance: 46.6 mL/min (A) (by C-G formula based on SCr of 2.11 mg/dL (H)).   Medical History: Past Medical History:  Diagnosis Date  . 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 CONGESTIVE HEART FAILURE 11/12/2007  . DIABETES MELLITUS, TYPE II 11/20/2008  . HYPERLIPIDEMIA 11/12/2007  . HYPERTENSION 11/12/2007  . TACHYCARDIA 01/24/2010    Medications:  Scheduled:  . aspirin EC  81 mg Oral Daily  . carvedilol  25 mg Oral BID WC  . furosemide  40 mg Intravenous BID  . insulin aspart  0-15 Units Subcutaneous TID AC & HS  . simvastatin  40 mg Oral Daily  . sodium chloride flush  3 mL Intravenous Q12H    Assessment: Patient is a 74 yom that presents to the ED with c/o SOB. COVID is negative however provider has high suspicion for PE at this time. Pharmacy has been asked to dose heparin for r/o PE.   11/19 AM update:  Heparin level at goal  Goal of Therapy:  Heparin level 0.3-0.7 units/ml Monitor platelets by anticoagulation protocol: Yes   Plan:  Cont heparin 1600 units/hr 1200 heparin level F/U VQ scan results later today  12/19, PharmD, BCPS Clinical Pharmacist Phone: [phone]

## 2020-03-12 NOTE — Progress Notes (Signed)
ANTICOAGULATION CONSULT NOTE  Pharmacy Consult for Heparin Indication: pulmonary embolus r/o  No Known Allergies  Patient Measurements: Height: 5\' 11"  (180.3 cm) Weight: 113.9 kg (251 lb 1.7 oz) IBW/kg (Calculated) : 75.3 Heparin Dosing Weight: 102 kg  Vital Signs: Temp: 98.2 F (36.8 C) (11/19 1256) Temp Source: Oral (11/19 1256) BP: 164/117 (11/19 1256) Pulse Rate: 95 (11/19 1256)  Labs: Recent Labs    03/11/20 1854 03/12/20 0217 03/12/20 0612 03/12/20 1140  HGB 12.6* 13.0  --   --   HCT 40.7 40.9  --   --   PLT 276 295  --   --   HEPARINUNFRC  --  0.68  --  0.43  CREATININE 2.11*  --   --   --   TROPONINIHS  --  129* 112*  --     Estimated Creatinine Clearance: 46.6 mL/min (A) (by C-G formula based on SCr of 2.11 mg/dL (H)).   Medical History: Past Medical History:  Diagnosis Date  . 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 CONGESTIVE HEART FAILURE 11/12/2007  . DIABETES MELLITUS, TYPE II 11/20/2008  . HYPERLIPIDEMIA 11/12/2007  . HYPERTENSION 11/12/2007  . TACHYCARDIA 01/24/2010    Medications:  Scheduled:  . aspirin EC  81 mg Oral Daily  . carvedilol  25 mg Oral BID WC  . furosemide  40 mg Intravenous BID  . insulin aspart  0-15 Units Subcutaneous TID AC & HS  . simvastatin  40 mg Oral Daily  . sodium chloride flush  3 mL Intravenous Q12H    Assessment: Patient is a 87 yom that presents to the ED with c/o SOB. COVID is negative however provider has high suspicion for PE at this time. Pharmacy has been asked to dose heparin for r/o PE.    Heparin drip 1600 uts/hr HL 0.43 at goal.  No bleeding noted CT negative bleed, awaiting VQ scan to to evaluate PE   Goal of Therapy:  Heparin level 0.3-0.7 units/ml Monitor platelets by anticoagulation protocol: Yes   Plan:  Cont heparin 1600 units/hr Daily HL, CBC Monitor s/s bleeding    68 Pharm.D. CPP, BCPS Clinical Pharmacist [phone] 03/12/2020 1:18 PM

## 2020-03-12 NOTE — Progress Notes (Signed)
IV team no longer needed at this time other access has been obtained

## 2020-03-12 NOTE — Progress Notes (Signed)
Primary RN notified of elevated BP of 200/116.  PRN hydralazine not available to be given again until 0820.  Scheduled coreg administered.  Primary RN also aware of red MEWS.

## 2020-03-12 NOTE — Progress Notes (Signed)
PT Cancellation Note  Patient Details Name: Trevor Anderson MRN: [id number] DOB: April 04, 1956   Cancelled Treatment:    Reason Eval/Treat Not Completed: Patient not medically ready Holding per RN as pt with elevated BPs. Awaiting possible VQ scan. Will follow.   Blake Divine A Neeko Pharo 03/12/2020, 8:26 AM [place], PT, DPT Acute Rehabilitation Services Pager [phone] Office [phone]

## 2020-03-13 ENCOUNTER — Inpatient Hospital Stay (HOSPITAL_COMMUNITY): Payer: No Typology Code available for payment source

## 2020-03-13 LAB — BASIC METABOLIC PANEL
Anion gap: 14 (ref 5–15)
BUN: 31 mg/dL — ABNORMAL HIGH (ref 8–23)
CO2: 23 mmol/L (ref 22–32)
Calcium: 8.5 mg/dL — ABNORMAL LOW (ref 8.9–10.3)
Chloride: 103 mmol/L (ref 98–111)
Creatinine, Ser: 2.38 mg/dL — ABNORMAL HIGH (ref 0.61–1.24)
GFR, Estimated: 30 mL/min — ABNORMAL LOW (ref >60)
Glucose, Bld: 108 mg/dL — ABNORMAL HIGH (ref 70–99)
Potassium: 3.4 mmol/L — ABNORMAL LOW (ref 3.5–5.1)
Sodium: 140 mmol/L (ref 135–145)

## 2020-03-13 LAB — CBC
HCT: 41.9 % (ref 39.0–52.0)
Hemoglobin: 13.5 g/dL (ref 13.0–17.0)
MCH: 31.2 pg (ref 26.0–34.0)
MCHC: 32.2 g/dL (ref 30.0–36.0)
MCV: 96.8 fL (ref 80.0–100.0)
Platelets: 280 10*3/uL (ref 150–400)
RBC: 4.33 MIL/uL (ref 4.22–5.81)
RDW: 13.7 % (ref 11.5–15.5)
WBC: 10.9 10*3/uL — ABNORMAL HIGH (ref 4.0–10.5)
nRBC: 0 % (ref 0.0–0.2)

## 2020-03-13 LAB — GLUCOSE, CAPILLARY
Glucose-Capillary: 109 mg/dL — ABNORMAL HIGH (ref 70–99)
Glucose-Capillary: 123 mg/dL — ABNORMAL HIGH (ref 70–99)
Glucose-Capillary: 145 mg/dL — ABNORMAL HIGH (ref 70–99)
Glucose-Capillary: 102 mg/dL — ABNORMAL HIGH (ref 70–99)
Glucose-Capillary: 128 mg/dL — ABNORMAL HIGH (ref 70–99)

## 2020-03-13 LAB — PHOSPHORUS: Phosphorus: 5.4 mg/dL — ABNORMAL HIGH (ref 2.5–4.6)

## 2020-03-13 LAB — HEPARIN LEVEL (UNFRACTIONATED): Heparin Unfractionated: <0.1 IU/mL — ABNORMAL LOW (ref 0.30–0.70)

## 2020-03-13 LAB — MAGNESIUM: Magnesium: 2 mg/dL (ref 1.7–2.4)

## 2020-03-13 MED ORDER — RESOURCE THICKENUP CLEAR PO POWD
ORAL | Status: DC | PRN
  Filled 2020-03-13: qty 125

## 2020-03-13 MED ORDER — POTASSIUM CHLORIDE 10 MEQ/100ML IV SOLN
10.0000 meq | INTRAVENOUS | Status: AC
  Administered 2020-03-13 (×2): 10 meq via INTRAVENOUS
  Filled 2020-03-13 (×2): qty 100

## 2020-03-13 NOTE — Evaluation (Signed)
Occupational Therapy Evaluation Patient Details Name: Trevor Anderson MRN: [id number] DOB: 11-06-1955 Today's Date: 03/13/2020    History of Present Illness HPI: 64 year old male with past medical history of diabetes mellitus type 2, coronary artery disease (cath 2009 with nonobstructive disease), systolic and diastolic congestive heart failure, hyperlipidemia, hypertension and medical noncompliance who presented to [facility] emergency department 11/18 with complaints of shortness of breath. Code stroke called am 11/19 due to acute right sided weakness and dysarthria.   Clinical Impression   Patient admitted with the above diagnosis.  PTA he was independent with all care and mobility.  Currently he needs Mod A to Total assist for ADL, toilet skills and functional mobility.  Deficits are listed below.  CIR was recommended, but they are not recommending admission.  SNF would be the next best choice.  He will need 24 hour care and longer term rehab to maximize independence for quality of life.  OT will continue to follow in the acute setting.      Follow Up Recommendations  SNF;Supervision/Assistance - 24 hour    Equipment Recommendations  Wheelchair (measurements OT);Wheelchair cushion (measurements OT)    Recommendations for Other Services       Precautions / Restrictions Precautions Precautions: Fall Restrictions Weight Bearing Restrictions: No RUE Weight Bearing: Non weight bearing RLE Weight Bearing: Non weight bearing Other Position/Activity Restrictions: R sided hemiparesis.  Thickened liquids and puree diet.      Mobility Bed Mobility   Bed Mobility: Rolling Rolling: Mod assist                                                                               ADL either performed or assessed with clinical judgement   ADL Overall ADL's : Needs assistance/impaired Eating/Feeding: Moderate assistance;Bed level   Grooming: Wash/dry  hands;Wash/dry face;Moderate assistance;Bed level   Upper Body Bathing: Maximal assistance;Bed level   Lower Body Bathing: Total assistance;Bed level   Upper Body Dressing : Maximal assistance;Bed level   Lower Body Dressing: Total assistance;Bed level       Toileting- Clothing Manipulation and Hygiene: Total assistance;Bed level               Vision Patient Visual Report: Other (comment) Vision Assessment?: Vision impaired- to be further tested in functional context Additional Comments: hemianopsia noted by Neuro doc.  Patient unable to focus enough to test peripheral vison.  Son notes he turns his head to the right and makes eye contact with him.  Confrontation testing attempted.     Perception     Praxis      Pertinent Vitals/Pain Pain Assessment: Faces Faces Pain Scale: Hurts little more Pain Location: R hip Pain Descriptors / Indicators: Sharp Pain Intervention(s): Monitored during session     Hand Dominance Right   Extremity/Trunk Assessment Upper Extremity Assessment Upper Extremity Assessment: RUE deficits/detail RUE Deficits / Details: flaccid RUE Sensation: decreased light touch RUE Coordination: decreased fine motor;decreased gross motor   Lower Extremity Assessment Lower Extremity Assessment: Defer to PT evaluation       Communication Communication Communication: Expressive difficulties   Cognition Arousal/Alertness: Awake/alert Behavior During Therapy: WFL for tasks assessed/performed Overall Cognitive Status: Difficult to assess  General Comments: Patient able to follow ine step and two step commands with increased processing time.   General Comments       Exercises     Shoulder Instructions      Home Living Family/patient expects to be discharged to:: Private residence Living Arrangements: Children Available Help at Discharge: Family;Available PRN/intermittently Type of Home:  House Home Access: Stairs to enter [employer] of Steps: 3 Entrance Stairs-Rails: Can reach both Home Layout: One level     Bathroom Shower/Tub: Chief Strategy Officer: Standard     Home Equipment: Cane - single point          Prior Functioning/Environment Level of Independence: Independent                 OT Problem List: Decreased strength;Decreased range of motion;Decreased activity tolerance;Impaired balance (sitting and/or standing);Decreased safety awareness;Decreased knowledge of use of DME or AE;Impaired vision/perception;Impaired UE functional use;Impaired sensation;Pain      OT Treatment/Interventions: Self-care/ADL training;Therapeutic exercise;Neuromuscular education;DME and/or AE instruction;Balance training;Patient/family education;Therapeutic activities    OT Goals(Current goals can be found in the care plan section) Acute Rehab OT Goals Patient Stated Goal: Patient is unsure what comes next. OT Goal Formulation: With patient Time For Goal Achievement: 03/27/20 Potential to Achieve Goals: Fair  OT Frequency: Min 2X/week   Barriers to D/C:    medical status       Co-evaluation              AM-PAC OT "6 Clicks" Daily Activity     Outcome Measure Help from another person eating meals?: A Lot Help from another person taking care of personal grooming?: A Lot Help from another person toileting, which includes using toliet, bedpan, or urinal?: Total Help from another person bathing (including washing, rinsing, drying)?: A Lot Help from another person to put on and taking off regular upper body clothing?: A Lot Help from another person to put on and taking off regular lower body clothing?: Total 6 Click Score: 10   End of Session    Activity Tolerance: Patient tolerated treatment well Patient left: in bed;with call bell/phone within reach;with bed alarm set;with family/visitor present  OT Visit Diagnosis: Other  abnormalities of gait and mobility (R26.89);Muscle weakness (generalized) (M62.81);Feeding difficulties (R63.3);Hemiplegia and hemiparesis;Pain Hemiplegia - Right/Left: Right Hemiplegia - dominant/non-dominant: Dominant Hemiplegia - caused by: Cerebral infarction Pain - Right/Left: Right Pain - part of body: Hip                Time: 9518-8416 OT Time Calculation (min): 22 min Charges:  OT General Charges $OT Visit: 1 Visit OT Evaluation $OT Eval Moderate Complexity: 1 Mod  03/13/2020  Rich, OTR/L  Acute Rehabilitation Services  Office:  [phone]   Suzanna Obey 03/13/2020, 5:32 PM

## 2020-03-13 NOTE — Progress Notes (Signed)
BP 170/120 Hydralazine 10 mg I.V. given per prn orders

## 2020-03-13 NOTE — Progress Notes (Signed)
  Speech Language Pathology Treatment: Dysphagia  Patient Details Name: Trevor Anderson MRN: [id number] DOB: [birth year] Today's Date: 03/13/2020 Time: 6283-6629 SLP Time Calculation (min) (ACUTE ONLY): 17 min  Assessment / Plan / Recommendation Clinical Impression  Pt demonstrates ongoing oral dysphagia. He has tolerated ice chips and meds crushed in puree. At baseline he is short to breath and complaining of right flank pain. After repositioning Pt was able to consume consecutive cup sips of water with anterior spillage and one cough. Suspect some poor coordination of swallowing and breathing with the added difficulty of oral control. Good prognosis for initiating an oral diet today, but will proceed with MBS for instrumental assessment of best diet.   HPI HPI: 64 year old male with past medical history of diabetes mellitus type 2, coronary artery disease (cath 2009 with nonobstructive disease), systolic and diastolic congestive heart failure, hyperlipidemia, hypertension and medical noncompliance who presented to [facility] emergency department 11/18 with complaints of shortness of breath. Code stroke called am 11/19 due to acute right sided weakness and dysarthria.  W/u pending. Failed RN stroke swallow screen.       SLP Plan  MBS       Recommendations  Diet recommendations:  (ice chips and meds) Medication Administration: Crushed with puree                Follow up Recommendations: Inpatient Rehab SLP Visit Diagnosis: Dysphagia, oropharyngeal phase (R13.12) Plan: MBS       GO               Harlon Ditty, MA CCC-SLP  Acute Rehabilitation Services Pager [phone] Office [phone]  Claudine Mouton 03/13/2020, 9:30 AM

## 2020-03-13 NOTE — Progress Notes (Signed)
Inpatient Rehab Admissions Coordinator Note:   Per Pt/ST recommendations, pt was screened for CIR candidacy by Wolfgang Phoenix, MS, CCC-SLP.  At this time we are not recommending an inpatient rehab consult.  Will continue to monitor pt's progress and tolerance of therapy from a distance.  Please contact me with questions.    Wolfgang Phoenix, MS, CCC-SLP Admissions Coordinator [phone] 03/13/20 2:53 PM

## 2020-03-13 NOTE — Progress Notes (Signed)
PROGRESS NOTE    Trevor Anderson  UEA:[id number] DOB: 08/19/1955 DOA: 03/11/2020 PCP: [name], MD   Brief Narrative:  This 64 years old male with PMH of DM II, CAD (cath 2009 nonobstructive disease), systolic and diastolic CHF (EF 47%), hyperlipidemia, hypertension and medical noncompliance presents in the emergency department with complaints of acute shortness of breath. He reports shortness of breath is progressively getting worse. Patient also reports fall from his chair while watching TV resulting in right hip pain,  worse with weightbearing. Patient was found to be tachycardic and tachypneic, after an evaluation. Patient was started on intravenous heparin with a concern for possible PE. VQ scan is ordered to rule out PE due to worsening renal functions. Patient was admitted for acute on chronic CHF exacerbation, suspected PE, hypertensive urgency, right hip pain. VQ scan negative, heparin discontinued. Patient was found to have right-sided weakness, slurring of his speech and  facial droop during morning round. Code stroke was called. Patient was not deemed a candidate for TPA. Exact time of symptoms unknown. CT head without abnormality in brain parenchyma. Patient was seen by neurology recommended MRI of the brain, PT/ OT consult speech consult.  Echocardiogram.  PT recommended CIR   Assessment & Plan:   Principal Problem:   Acute on chronic systolic CHF (congestive heart failure) (HCC) Active Problems:   Type 2 diabetes mellitus without complication, with long-term current use of insulin (HCC)   Mixed hyperlipidemia   Coronary artery disease involving native coronary artery of native heart without angina pectoris   Hypertensive urgency   AKI (acute kidney injury) (HCC)   Fall at home, initial encounter   Right hip pain   Hypokalemia  Right-sided weakness secondary to  the stroke. Patient is found to have right-sided weakness in the morning round. Code Stroke called,   neurology evaluated the patient  CTA Head/ Neck : Negative for large vessel occlusion, and no core infarct or ischemic penumbra detected by CT Perfusion. Advised stroke pathway. Neurology recommended dual antiplatelet therapy for now (aspirin and Plavix Speech and swallow evaluation completed recommended MBS PT evaluation completed recommended CIR / SNF Neurology will follow.    Acute on chronic systolic CHF (congestive heart failure) (HCC)   Patient presenting with 2-week history of paroxysmal nocturnal dyspnea and increasing abdominal girth with 1 week history of progressively worsening shortness of breath  BNP of 705.    Patient has been noncompliant with his antihypertensives likely precipitating acute failure.  Patient was started on Lasix 40 mg IV twice daily  Strict input and output monitoring  Monitoring renal function electrolytes with serial chemistries  Monitoring patient on telemetry  Echocardiogram LVEF 45 to 50% , left ventricle has decreased function.  Global hypokinesis   Due to ongoing tachycardia and relatively clear chest x-ray emergency department provider was also concerned about possible thromboembolic disease and placed the patient on heparin infusion.  VQ scan negative, heparin discontinued.     Hypertensive urgency   Patient presenting with hypertensive urgency with blood pressures as high as 213/151.    Patient admits to "not always taking my blood pressure medications".  When asked where he gets his antihypertensives, he states that he gets them from Dr. Jonny Ruiz.  Per review of our records patient has not seen Dr. Jonny Ruiz since January 2019.  Providing patient with as needed intravenous antihypertensives for systolic blood pressures above 200 at this time.  Targeting 25% blood pressure reduction overnight, around a systolic blood pressure of  180.  Blood pressure improved.  Placing patient in progressive unit for close monitoring    AKI (acute  kidney injury) [facility])  Patient presenting with creatinine of 2.11, an increase from 1.19 2 years ago  Because of patient's lack of outpatient follow-up it is unclear as to whether or not this elevated creatinine is due to acute kidney injury or progression of chronic kidney disease in the setting of uncontrolled blood pressure.  We will manage blood pressure slowly with IV n.p.o. blood pressure medications  Strict input and output monitoring  Monitoring renal function and electrolytes with serial chemistries  Renal ultrasound unremarkable    Fall at home, initial encounter  Patient reports suffering a fall at home earlier in the day on 11/18.  Patient complains of right hip pain .  X-ray of the right hip reveals a possible femur fracture -however upon discussion of these images between the emergency department provider and Dr. Cyndia Bent orthopedic surgery disagrees with this read and recommends more advanced imaging.    CT hip shows unremarkable,  no fracture noted.     Type 2 diabetes mellitus without complication, with long-term current use of insulin (HCC)   Accu-Cheks before every meal and nightly with signs scale insulin  Hemoglobin A1c  Hold Metformin    Coronary artery disease involving native coronary artery of native heart without angina pectoris   Continue aspirin and beta-blocker, Plavix was added due to stroke.  Patient is chest pain-free  Monitoring patient on telemetry   EKG reveals no evidence of dynamic ST segment changes  Troponin pending  Lipid panel pending and will initiate statin if greater than 70.    Mixed hyperlipidemia    Question whether patient is actually taking statin at this time.  Patient states that he inconsistently takes any medications.  Obtaining lipid panel for the morning, will resume statin therapy if LDL is greater than 70.    DVT prophylaxis: SCDs Code Status: Full Family Communication: spoke with son at bed  side. Disposition Plan:  Status is: Inpatient  Remains inpatient appropriate because:Inpatient level of care appropriate due to severity of illness   Dispo: The patient is from: Home              Anticipated d/c is to: SNF              Anticipated d/c date is: > 3 days              Patient currently is not medically stable to d/c.   Consultants:   Neurology  Procedures:  Antimicrobials:  Anti-infectives (From admission, onward)   None      Subjective: Patient was seen and examined at bedside.  Overnight events noted.  Patient has right-sided weakness.  Patient still has slurring of his speech.  Patient blood pressure is improved.  Objective: Vitals:   03/13/20 1000 03/13/20 1120 03/13/20 1139 03/13/20 1309  BP: (!) 141/123 (!) 142/102 (!) 129/96   Pulse: 79 81 81   Resp: 16 16 17    Temp:  98 F (36.7 C)    TempSrc:  Oral    SpO2: 99% 100% 100% 99%  Weight:      Height:        Intake/Output Summary (Last 24 hours) at 03/13/2020 1521 Last data filed at 03/13/2020 1139 Gross per 24 hour  Intake 100 ml  Output 1925 ml  Net -1825 ml   Filed Weights   03/11/20 2100 03/12/20 0216 03/13/20 0419  Weight:  120.7 kg 113.9 kg 111.6 kg    Examination:  General exam: Appears calm and comfortable , facial droop Respiratory system: Clear to auscultation. Respiratory effort normal. Cardiovascular system: S1 & S2 heard, RRR. No JVD, murmurs, rubs, gallops or clicks. No pedal edema. Gastrointestinal system: Abdomen is nondistended, soft and nontender. No organomegaly or masses felt. Normal bowel sounds heard. Central nervous system: Alert and oriented.  Facial droop, slurring of speech Extremities: Right-sided weakness, RUE 1/5 , RLE 1/5 Skin: No rashes, lesions or ulcers Psychiatry: Judgement and insight appear normal. Mood & affect appropriate.     Data Reviewed: I have personally reviewed following labs and imaging studies  CBC: Recent Labs  Lab 03/11/20 1854  03/12/20 0217 03/13/20 0129  WBC 10.9* 12.4* 10.9*  HGB 12.6* 13.0 13.5  HCT 40.7 40.9 41.9  MCV 98.8 96.0 96.8  PLT 276 295 280   Basic Metabolic Panel: Recent Labs  Lab 03/11/20 1854 03/12/20 0217 03/13/20 0129  NA 141  --  140  K 3.2*  --  3.4*  CL 107  --  103  CO2 18*  --  23  GLUCOSE 146*  --  108*  BUN 23  --  31*  CREATININE 2.11*  --  2.38*  CALCIUM 8.9  --  8.5*  MG  --  1.8 2.0  PHOS  --   --  5.4*   GFR: Estimated Creatinine Clearance: 40.9 mL/min (A) (by C-G formula based on SCr of 2.38 mg/dL (H)). Liver Function Tests: No results for input(s): AST, ALT, ALKPHOS, BILITOT, PROT, ALBUMIN in the last 168 hours. No results for input(s): LIPASE, AMYLASE in the last 168 hours. No results for input(s): AMMONIA in the last 168 hours. Coagulation Profile: No results for input(s): INR, PROTIME in the last 168 hours. Cardiac Enzymes: No results for input(s): CKTOTAL, CKMB, CKMBINDEX, TROPONINI in the last 168 hours. BNP (last 3 results) No results for input(s): PROBNP in the last 8760 hours. HbA1C: Recent Labs    03/12/20 0217  HGBA1C 6.8*   CBG: Recent Labs  Lab 03/12/20 1609 03/12/20 2129 03/13/20 0237 03/13/20 0605 03/13/20 1120  GLUCAP 108* 134* 109* 123* 145*   Lipid Profile: Recent Labs    03/12/20 0217  CHOL 232*  HDL 46  LDLCALC 173*  TRIG 67  CHOLHDL 5.0   Thyroid Function Tests: No results for input(s): TSH, T4TOTAL, FREET4, T3FREE, THYROIDAB in the last 72 hours. Anemia Panel: No results for input(s): VITAMINB12, FOLATE, FERRITIN, TIBC, IRON, RETICCTPCT in the last 72 hours. Sepsis Labs: No results for input(s): PROCALCITON, LATICACIDVEN in the last 168 hours.  Recent Results (from the past 240 hour(s))  Respiratory Panel by RT PCR (Flu A&B, Covid) - Nasopharyngeal Swab     Status: None   Collection Time: 03/11/20  6:15 PM   Specimen: Nasopharyngeal Swab; Nasopharyngeal(NP) swabs in vial transport medium  Result Value Ref Range  Status   SARS Coronavirus 2 by RT PCR NEGATIVE NEGATIVE Final    Comment: (NOTE) SARS-CoV-2 target nucleic acids are NOT DETECTED.  The SARS-CoV-2 RNA is generally detectable in upper respiratoy specimens during the acute phase of infection. The lowest concentration of SARS-CoV-2 viral copies this assay can detect is 131 copies/mL. A negative result does not preclude SARS-Cov-2 infection and should not be used as the sole basis for treatment or other patient management decisions. A negative result may occur with  improper specimen collection/handling, submission of specimen other than nasopharyngeal swab, presence of viral mutation(s) within  the areas targeted by this assay, and inadequate number of viral copies (<131 copies/mL). A negative result must be combined with clinical observations, patient history, and epidemiological information. The expected result is Negative.  Fact Sheet for Patients:  https://www.moore.com/  Fact Sheet for Healthcare Providers:  https://www.young.biz/  This test is no t yet approved or cleared by the [place] FDA and  has been authorized for detection and/or diagnosis of SARS-CoV-2 by FDA under an Emergency Use Authorization (EUA). This EUA will remain  in effect (meaning this test can be used) for the duration of the COVID-19 declaration under Section 564(b)(1) of the Act, 21 U.S.C. section 360bbb-3(b)(1), unless the authorization is terminated or revoked sooner.     Influenza A by PCR NEGATIVE NEGATIVE Final   Influenza B by PCR NEGATIVE NEGATIVE Final    Comment: (NOTE) The Xpert Xpress SARS-CoV-2/FLU/RSV assay is intended as an aid in  the diagnosis of influenza from Nasopharyngeal swab specimens and  should not be used as a sole basis for treatment. Nasal washings and  aspirates are unacceptable for Xpert Xpress SARS-CoV-2/FLU/RSV  testing.  Fact Sheet for  Patients: https://www.moore.com/  Fact Sheet for Healthcare Providers: https://www.young.biz/  This test is not yet approved or cleared by the [place] FDA and  has been authorized for detection and/or diagnosis of SARS-CoV-2 by  FDA under an Emergency Use Authorization (EUA). This EUA will remain  in effect (meaning this test can be used) for the duration of the  Covid-19 declaration under Section 564(b)(1) of the Act, 21  U.S.C. section 360bbb-3(b)(1), unless the authorization is  terminated or revoked. Performed at [facility], [address], [place], [place] [address]          Radiology Studies: CT Code Stroke CTA Head W/WO contrast  Result Date: 03/12/2020 CLINICAL DATA:  64 year old male code stroke presentation. Right side symptoms. EXAM: CT ANGIOGRAPHY HEAD AND NECK CT PERFUSION BRAIN TECHNIQUE: Multidetector CT imaging of the head and neck was performed using the standard protocol during bolus administration of intravenous contrast. Multiplanar CT image reconstructions and MIPs were obtained to evaluate the vascular anatomy. Carotid stenosis measurements (when applicable) are obtained utilizing NASCET criteria, using the distal internal carotid diameter as the denominator. Multiphase CT imaging of the brain was performed following IV bolus contrast injection. Subsequent parametric perfusion maps were calculated using RAPID software. CONTRAST:  OMNIPAQUE IOHEXOL 350 MG/ML SOLN COMPARISON:  Plain head CT 0845 hours. FINDINGS: CT Brain Perfusion Findings: ASPECTS: 10 CBF (<30%) Volume: None Perfusion (Tmax>6.0s) volume: None Mismatch Volume: Not applicable Infarction Location:Not applicable CTA NECK Skeleton: No acute osseous abnormality identified. Upper chest: Upper lung atelectasis. Negative visible superior mediastinum. Other neck: The glottis is closed. No acute neck soft tissue finding is identified. Aortic arch: 3 vessel  arch configuration. Minimal arch atherosclerosis. Right carotid system: Tortuous brachiocephalic artery and proximal right CCA without stenosis. Minimal calcified plaque at the right ICA origin and bulb without stenosis. Left carotid system: Mildly tortuous proximal left CCA. Mild calcified plaque at the left ICA origin and bulb without stenosis. Vertebral arteries: Calcified plaque and tortuosity at the right subclavian artery origin without significant stenosis. Normal right vertebral artery origin. Tortuous right V1 segment. Patent right vertebral to the skull base without stenosis. Mildly tortuous proximal left subclavian artery without significant plaque. Normal left vertebral artery origin. Mildly tortuous left V1 segment. Codominant left vertebral is patent to the skull base without plaque or stenosis. CTA HEAD Posterior circulation: Patent  right vertebral V4 segment to the vertebrobasilar junction without stenosis. Patent right PICA origin. Normal left PICA origin. Beyond the left PICA in the left V4 segment there is evidence of soft plaque on series 7, image 159 but only mild associated stenosis. Patent left vertebrobasilar junction. Patent basilar artery with mild irregularity but no stenosis. Patent SCA and left PCA origins. Fetal right PCA origin. Left posterior communicating artery diminutive or absent. Mild to moderate left PCA P2 segment irregularity and stenosis on series 12, image 23. Mild right PCA irregularity without stenosis. Anterior circulation: Both ICA siphons are patent without plaque or stenosis. Normal ophthalmic and right posterior communicating artery origins. Patent carotid termini. Patent MCA and ACA origins. Diminutive or absent anterior communicating artery. Mild bilateral ACA irregularity. No proximal ACA stenosis. Up to moderate distal ACA irregularity and stenosis on series 12, image 21. Right MCA M1 segment is patent to the bifurcation with mild irregularity and stenosis. At the  right MCA bifurcation there is moderate stenosis. Right MCA M2 branches are patent. There is moderate right posterior MCA M2 and M3 branch irregularity and stenosis (series 12, image 12. Moderate right anterior M3 irregularity and stenosis. No right MCA branch occlusion identified. Mild to moderate irregularity with mild stenosis of the distal left MCA M1 (series 7, image 116). Left MCA bifurcation is patent. There is no M2 branch occlusion. Only mild proximal M2 irregularity. There is more moderate irregularity of the distal posterior M2 with mild to moderate stenosis (series 12, image 29). No right MCA branch occlusion is identified. Venous sinuses: Early contrast timing, not well evaluated. Anatomic variants: Fetal right PCA origin. Review of the MIP images confirms the above findings IMPRESSION: 1. Negative for large vessel occlusion, and no core infarct or ischemic penumbra detected by CT Perfusion. 2. Mild for age extracranial but fairly advanced intracranial atherosclerosis. Up to moderate circle-of-Willis branch irregularity and stenoses in the bilateral MCAs (M2 and M3), left PCA (P2), and distal ACAs 3. Preliminary results of the above discussed by telephone with Dr. Otelia Limes at [phone] hours. Final results were communicated to Dr. Otelia Limes at [phone] hours 03/12/2020 by text page via the [facility] messaging system. Electronically Signed   By: Odessa Fleming M.D.   On: 03/12/2020 09:55   DG Chest 2 View  Result Date: 03/11/2020 CLINICAL DATA:  Cough, shortness of breath EXAM: CHEST - 2 VIEW COMPARISON:  08/21/2007 FINDINGS: Cardiomegaly. No confluent opacities, effusions or edema. No acute bony abnormality. IMPRESSION: Cardiomegaly.  No acute cardiopulmonary disease. Electronically Signed   By: Charlett Nose M.D.   On: 03/11/2020 18:53   CT Code Stroke CTA Neck W/WO contrast  Result Date: 03/12/2020 CLINICAL DATA:  64 year old male code stroke presentation. Right side symptoms. EXAM: CT ANGIOGRAPHY HEAD AND NECK CT  PERFUSION BRAIN TECHNIQUE: Multidetector CT imaging of the head and neck was performed using the standard protocol during bolus administration of intravenous contrast. Multiplanar CT image reconstructions and MIPs were obtained to evaluate the vascular anatomy. Carotid stenosis measurements (when applicable) are obtained utilizing NASCET criteria, using the distal internal carotid diameter as the denominator. Multiphase CT imaging of the brain was performed following IV bolus contrast injection. Subsequent parametric perfusion maps were calculated using RAPID software. CONTRAST:  OMNIPAQUE IOHEXOL 350 MG/ML SOLN COMPARISON:  Plain head CT 0845 hours. FINDINGS: CT Brain Perfusion Findings: ASPECTS: 10 CBF (<30%) Volume: None Perfusion (Tmax>6.0s) volume: None Mismatch Volume: Not applicable Infarction Location:Not applicable CTA NECK Skeleton: No acute osseous abnormality identified. Upper  chest: Upper lung atelectasis. Negative visible superior mediastinum. Other neck: The glottis is closed. No acute neck soft tissue finding is identified. Aortic arch: 3 vessel arch configuration. Minimal arch atherosclerosis. Right carotid system: Tortuous brachiocephalic artery and proximal right CCA without stenosis. Minimal calcified plaque at the right ICA origin and bulb without stenosis. Left carotid system: Mildly tortuous proximal left CCA. Mild calcified plaque at the left ICA origin and bulb without stenosis. Vertebral arteries: Calcified plaque and tortuosity at the right subclavian artery origin without significant stenosis. Normal right vertebral artery origin. Tortuous right V1 segment. Patent right vertebral to the skull base without stenosis. Mildly tortuous proximal left subclavian artery without significant plaque. Normal left vertebral artery origin. Mildly tortuous left V1 segment. Codominant left vertebral is patent to the skull base without plaque or stenosis. CTA HEAD Posterior circulation: Patent right  vertebral V4 segment to the vertebrobasilar junction without stenosis. Patent right PICA origin. Normal left PICA origin. Beyond the left PICA in the left V4 segment there is evidence of soft plaque on series 7, image 159 but only mild associated stenosis. Patent left vertebrobasilar junction. Patent basilar artery with mild irregularity but no stenosis. Patent SCA and left PCA origins. Fetal right PCA origin. Left posterior communicating artery diminutive or absent. Mild to moderate left PCA P2 segment irregularity and stenosis on series 12, image 23. Mild right PCA irregularity without stenosis. Anterior circulation: Both ICA siphons are patent without plaque or stenosis. Normal ophthalmic and right posterior communicating artery origins. Patent carotid termini. Patent MCA and ACA origins. Diminutive or absent anterior communicating artery. Mild bilateral ACA irregularity. No proximal ACA stenosis. Up to moderate distal ACA irregularity and stenosis on series 12, image 21. Right MCA M1 segment is patent to the bifurcation with mild irregularity and stenosis. At the right MCA bifurcation there is moderate stenosis. Right MCA M2 branches are patent. There is moderate right posterior MCA M2 and M3 branch irregularity and stenosis (series 12, image 12. Moderate right anterior M3 irregularity and stenosis. No right MCA branch occlusion identified. Mild to moderate irregularity with mild stenosis of the distal left MCA M1 (series 7, image 116). Left MCA bifurcation is patent. There is no M2 branch occlusion. Only mild proximal M2 irregularity. There is more moderate irregularity of the distal posterior M2 with mild to moderate stenosis (series 12, image 29). No right MCA branch occlusion is identified. Venous sinuses: Early contrast timing, not well evaluated. Anatomic variants: Fetal right PCA origin. Review of the MIP images confirms the above findings IMPRESSION: 1. Negative for large vessel occlusion, and no core  infarct or ischemic penumbra detected by CT Perfusion. 2. Mild for age extracranial but fairly advanced intracranial atherosclerosis. Up to moderate circle-of-Willis branch irregularity and stenoses in the bilateral MCAs (M2 and M3), left PCA (P2), and distal ACAs 3. Preliminary results of the above discussed by telephone with Dr. Otelia Limes at [phone] hours. Final results were communicated to Dr. Otelia Limes at [phone] hours 03/12/2020 by text page via the [facility] messaging system. Electronically Signed   By: Odessa Fleming M.D.   On: 03/12/2020 09:55   NM Pulmonary Perfusion  Result Date: 03/12/2020 CLINICAL DATA:  PE suspected.  High probability. EXAM: NUCLEAR MEDICINE PERFUSION LUNG SCAN TECHNIQUE: Perfusion images were obtained in multiple projections after intravenous injection of radiopharmaceutical. Ventilation scans intentionally deferred if perfusion scan and chest x-ray adequate for interpretation during COVID 19 epidemic. RADIOPHARMACEUTICALS:  4.3 mCi Tc-5m MAA IV COMPARISON:  None. FINDINGS: No significant filling  defects are present. Normal perfusion is present bilaterally. IMPRESSION: Normal V/Q scan.  No pulmonary embolus. Electronically Signed   By: Marin Roberts M.D.   On: 03/12/2020 13:44   US RENAL  Result Date: 03/12/2020 CLINICAL DATA:  Renal failure EXAM: RENAL / URINARY TRACT ULTRASOUND COMPLETE COMPARISON:  08/22/2007 FINDINGS: Right Kidney: Renal measurements: 11 x 5 x 5 cm = volume: 140 mL. Echogenicity is borderline increased based on corticomedullary differentiation. No mass or hydronephrosis visualized. Left Kidney: Renal measurements: 11 x 7 x 5 cm = volume: There is 200 mL. Echogenicity is borderline increased. No mass or hydronephrosis visualized. Bladder: Appears normal for degree of bladder distention. Other: Prominent liver echogenicity which could reflect steatosis. IMPRESSION: No hydronephrosis or renal atrophy. Electronically Signed   By: Marnee Spring M.D.   On: 03/12/2020  04:09   CT HIP RIGHT WO CONTRAST  Result Date: 03/12/2020 CLINICAL DATA:  Concern for hip fracture.  Inconclusive x-ray EXAM: CT OF THE RIGHT HIP WITHOUT CONTRAST TECHNIQUE: Multidetector CT imaging of the right hip was performed according to the standard protocol. Multiplanar CT image reconstructions were also generated. COMPARISON:  Radiography from yesterday FINDINGS: Bones/Joint/Cartilage No acute fracture or dislocation. Hip osteoarthritis with superolateral joint narrowing causing bone-on-bone contact and subchondral cystic formation. Degenerative marginal spurring at the hip joint which is fairly bulky. Degenerative right sacroiliac spurring. Ligaments Suboptimally assessed by CT. Muscles and Tendons No visible injury. Soft tissues No hematoma or opaque foreign body IMPRESSION: 1. No acute finding. 2. Advanced right hip osteoarthritis. Electronically Signed   By: Marnee Spring M.D.   On: 03/12/2020 04:07   CT Code Stroke Cerebral Perfusion with contrast  Result Date: 03/12/2020 CLINICAL DATA:  64 year old male code stroke presentation. Right side symptoms. EXAM: CT ANGIOGRAPHY HEAD AND NECK CT PERFUSION BRAIN TECHNIQUE: Multidetector CT imaging of the head and neck was performed using the standard protocol during bolus administration of intravenous contrast. Multiplanar CT image reconstructions and MIPs were obtained to evaluate the vascular anatomy. Carotid stenosis measurements (when applicable) are obtained utilizing NASCET criteria, using the distal internal carotid diameter as the denominator. Multiphase CT imaging of the brain was performed following IV bolus contrast injection. Subsequent parametric perfusion maps were calculated using RAPID software. CONTRAST:  OMNIPAQUE IOHEXOL 350 MG/ML SOLN COMPARISON:  Plain head CT 0845 hours. FINDINGS: CT Brain Perfusion Findings: ASPECTS: 10 CBF (<30%) Volume: None Perfusion (Tmax>6.0s) volume: None Mismatch Volume: Not applicable Infarction  Location:Not applicable CTA NECK Skeleton: No acute osseous abnormality identified. Upper chest: Upper lung atelectasis. Negative visible superior mediastinum. Other neck: The glottis is closed. No acute neck soft tissue finding is identified. Aortic arch: 3 vessel arch configuration. Minimal arch atherosclerosis. Right carotid system: Tortuous brachiocephalic artery and proximal right CCA without stenosis. Minimal calcified plaque at the right ICA origin and bulb without stenosis. Left carotid system: Mildly tortuous proximal left CCA. Mild calcified plaque at the left ICA origin and bulb without stenosis. Vertebral arteries: Calcified plaque and tortuosity at the right subclavian artery origin without significant stenosis. Normal right vertebral artery origin. Tortuous right V1 segment. Patent right vertebral to the skull base without stenosis. Mildly tortuous proximal left subclavian artery without significant plaque. Normal left vertebral artery origin. Mildly tortuous left V1 segment. Codominant left vertebral is patent to the skull base without plaque or stenosis. CTA HEAD Posterior circulation: Patent right vertebral V4 segment to the vertebrobasilar junction without stenosis. Patent right PICA origin. Normal left PICA origin. Beyond the left PICA in  the left V4 segment there is evidence of soft plaque on series 7, image 159 but only mild associated stenosis. Patent left vertebrobasilar junction. Patent basilar artery with mild irregularity but no stenosis. Patent SCA and left PCA origins. Fetal right PCA origin. Left posterior communicating artery diminutive or absent. Mild to moderate left PCA P2 segment irregularity and stenosis on series 12, image 23. Mild right PCA irregularity without stenosis. Anterior circulation: Both ICA siphons are patent without plaque or stenosis. Normal ophthalmic and right posterior communicating artery origins. Patent carotid termini. Patent MCA and ACA origins. Diminutive or  absent anterior communicating artery. Mild bilateral ACA irregularity. No proximal ACA stenosis. Up to moderate distal ACA irregularity and stenosis on series 12, image 21. Right MCA M1 segment is patent to the bifurcation with mild irregularity and stenosis. At the right MCA bifurcation there is moderate stenosis. Right MCA M2 branches are patent. There is moderate right posterior MCA M2 and M3 branch irregularity and stenosis (series 12, image 12. Moderate right anterior M3 irregularity and stenosis. No right MCA branch occlusion identified. Mild to moderate irregularity with mild stenosis of the distal left MCA M1 (series 7, image 116). Left MCA bifurcation is patent. There is no M2 branch occlusion. Only mild proximal M2 irregularity. There is more moderate irregularity of the distal posterior M2 with mild to moderate stenosis (series 12, image 29). No right MCA branch occlusion is identified. Venous sinuses: Early contrast timing, not well evaluated. Anatomic variants: Fetal right PCA origin. Review of the MIP images confirms the above findings IMPRESSION: 1. Negative for large vessel occlusion, and no core infarct or ischemic penumbra detected by CT Perfusion. 2. Mild for age extracranial but fairly advanced intracranial atherosclerosis. Up to moderate circle-of-Willis branch irregularity and stenoses in the bilateral MCAs (M2 and M3), left PCA (P2), and distal ACAs 3. Preliminary results of the above discussed by telephone with Dr. Otelia Limes at [phone] hours. Final results were communicated to Dr. Otelia Limes at [phone] hours 03/12/2020 by text page via the [facility] messaging system. Electronically Signed   By: Odessa Fleming M.D.   On: 03/12/2020 09:55   DG Swallowing Func-Speech Pathology  Result Date: 03/13/2020 Objective Swallowing Evaluation: Type of Study: MBS-Modified Barium Swallow Study  Patient Details Name: CORDARO MUKAI MRN: [id number] Date of Birth: 04/29/1955 Today's Date: 03/13/2020 Time: SLP Start Time (ACUTE  ONLY): 1000 -SLP Stop Time (ACUTE ONLY): 1024 SLP Time Calculation (min) (ACUTE ONLY): 24 min Past Medical History: Past Medical History: Diagnosis Date . 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 CONGESTIVE HEART FAILURE 11/12/2007 . DIABETES MELLITUS, TYPE II 11/20/2008 . HYPERLIPIDEMIA 11/12/2007 . HYPERTENSION 11/12/2007 . TACHYCARDIA 01/24/2010 Past Surgical History: Past Surgical History: Procedure Laterality Date . TONSILLECTOMY  2217 HPI: 64 year old male with past medical history of diabetes mellitus type 2, coronary artery disease (cath 2009 with nonobstructive disease), systolic and diastolic congestive heart failure, hyperlipidemia, hypertension and medical noncompliance who presented to [facility] emergency department 11/18 with complaints of shortness of breath. Code stroke called am 11/19 due to acute right sided weakness and dysarthria.  W/u pending. Failed RN stroke swallow screen.  Subjective: alert Assessment / Plan / Recommendation CHL IP CLINICAL IMPRESSIONS 03/13/2020 Clinical Impression Pt presents with a primary oral dysphagia with right CN VII and XII weakness with senory and motor impairment leading to difficult lingual manipulation of solids and right sided anterior bolus loss and residue. Oral phase contributes to mild pharyngeal dysphagia with premature spillage and at times, delayed swallow initiation. WIth  thin liquids there are instances of silent frank penetration with ejection and trace sensed aspiration with ejection. Pt is dyspneic and mildy impulsive with poor trunk control for adequate positioning and impaired self feeding. Over the weekend recommend a conservative diet of puree and nectar thick liquids, though given full airway protection expect pt to upgrade to thin liquids under supervision. Recommend CIR, will f/u for tolerance of diet.  SLP Visit Diagnosis Dysphagia, oropharyngeal phase (R13.12) Attention and concentration deficit following -- Frontal lobe and executive  function deficit following -- Impact on safety and function Moderate aspiration risk   CHL IP TREATMENT RECOMMENDATION 03/12/2020 Treatment Recommendations Therapy as outlined in treatment plan below   Prognosis 03/13/2020 Prognosis for Safe Diet Advancement Good Barriers to Reach Goals -- Barriers/Prognosis Comment -- CHL IP DIET RECOMMENDATION 03/13/2020 SLP Diet Recommendations Dysphagia 2 (Fine chop) solids;Nectar thick liquid Liquid Administration via Cup;Straw Medication Administration Whole meds with puree Compensations Minimize environmental distractions;Lingual sweep for clearance of pocketing;Monitor for anterior loss Postural Changes Seated upright at 90 degrees;Remain semi-upright after after feeds/meals (Comment)   CHL IP OTHER RECOMMENDATIONS 03/13/2020 Recommended Consults -- Oral Care Recommendations Oral care BID Other Recommendations --   CHL IP FOLLOW UP RECOMMENDATIONS 03/13/2020 Follow up Recommendations Inpatient Rehab   CHL IP FREQUENCY AND DURATION 03/13/2020 Speech Therapy Frequency (ACUTE ONLY) min 2x/week Treatment Duration 1 week      CHL IP ORAL PHASE 03/13/2020 Oral Phase Impaired Oral - Pudding Teaspoon -- Oral - Pudding Cup -- Oral - Honey Teaspoon -- Oral - Honey Cup -- Oral - Nectar Teaspoon -- Oral - Nectar Cup Lingual/palatal residue;Right pocketing in lateral sulci Oral - Nectar Straw Lingual/palatal residue;Right pocketing in lateral sulci;Other (Comment) Oral - Thin Teaspoon -- Oral - Thin Cup Right anterior bolus loss;Right pocketing in lateral sulci;Lingual/palatal residue;Decreased bolus cohesion;Premature spillage;Other (Comment) Oral - Thin Straw Right anterior bolus loss;Right pocketing in lateral sulci;Lingual/palatal residue;Decreased bolus cohesion;Premature spillage;Other (Comment) Oral - Puree Right pocketing in lateral sulci;Lingual/palatal residue Oral - Mech Soft Decreased bolus cohesion;Lingual/palatal residue;Reduced posterior propulsion;Weak lingual  manipulation;Impaired mastication Oral - Regular -- Oral - Multi-Consistency -- Oral - Pill WFL Oral Phase - Comment --  CHL IP PHARYNGEAL PHASE 03/13/2020 Pharyngeal Phase Impaired Pharyngeal- Pudding Teaspoon -- Pharyngeal -- Pharyngeal- Pudding Cup -- Pharyngeal -- Pharyngeal- Honey Teaspoon -- Pharyngeal -- Pharyngeal- Honey Cup -- Pharyngeal -- Pharyngeal- Nectar Teaspoon -- Pharyngeal -- Pharyngeal- Nectar Cup Delayed swallow initiation-pyriform sinuses;Delayed swallow initiation-vallecula Pharyngeal -- Pharyngeal- Nectar Straw Delayed swallow initiation-vallecula;Delayed swallow initiation-pyriform sinuses Pharyngeal -- Pharyngeal- Thin Teaspoon -- Pharyngeal -- Pharyngeal- Thin Cup Delayed swallow initiation-pyriform sinuses;Penetration/Aspiration before swallow Pharyngeal Material enters airway, CONTACTS cords and then ejected out;Material enters airway, passes BELOW cords then ejected out;Material does not enter airway Pharyngeal- Thin Straw Delayed swallow initiation-pyriform sinuses;Penetration/Aspiration before swallow Pharyngeal Material enters airway, CONTACTS cords and then ejected out;Material enters airway, passes BELOW cords then ejected out;Material does not enter airway Pharyngeal- Puree Delayed swallow initiation-vallecula Pharyngeal -- Pharyngeal- Mechanical Soft Delayed swallow initiation-vallecula Pharyngeal -- Pharyngeal- Regular -- Pharyngeal -- Pharyngeal- Multi-consistency -- Pharyngeal -- Pharyngeal- Pill Delayed swallow initiation-vallecula Pharyngeal -- Pharyngeal Comment --  No flowsheet data found. Harlon Ditty, MA CCC-SLP Acute Rehabilitation Services Pager [phone] Office [phone] Claudine Mouton 03/13/2020, 10:43 AM              ECHOCARDIOGRAM COMPLETE  Result Date: 03/12/2020    ECHOCARDIOGRAM REPORT   Patient Name:   Trevor Anderson Date of Exam: 03/12/2020 Medical Rec #:  [id number]  Height:       71.0 in Accession #:    [id number]    Weight:        251.1 lb Date of Birth:  06-Jun-1955     BSA:          2.323 m Patient Age:    64 years      BP:           200/116 mmHg Patient Gender: M             HR:           91 bpm. Exam Location:  Inpatient Procedure: 2D Echo, Cardiac Doppler, Color Doppler and Intracardiac            Opacification Agent Indications:    CHF-Acute Systolic 428.21 / I50.21  History:        Patient has prior history of Echocardiogram examinations, most                 recent 09/15/2014. CHF and Cardiomyopathy; Risk                 Factors:Hypertension, Diabetes, Dyslipidemia and Non-Smoker.  Sonographer:    Renella Cunas RDCS Referring Phys: [id number] Deno Lunger [facility]  Sonographer Comments: Technically difficult study due to poor echo windows. IMPRESSIONS  1. Left ventricular ejection fraction, by estimation, is 45 to 50%. The left ventricle has mildly decreased function. The left ventricle demonstrates global hypokinesis. There is mild concentric left ventricular hypertrophy. Left ventricular diastolic parameters are consistent with Grade I diastolic dysfunction (impaired relaxation).  2. Right ventricular systolic function is normal. The right ventricular size is normal. Tricuspid regurgitation signal is inadequate for assessing PA pressure.  3. The mitral valve is grossly normal. Trivial mitral valve regurgitation. No evidence of mitral stenosis.  4. The aortic valve is tricuspid. Aortic valve regurgitation is not visualized. No aortic stenosis is present.  5. The inferior vena cava is normal in size with greater than 50% respiratory variability, suggesting right atrial pressure of 3 mmHg. Comparison(s): Changes from prior study are noted. EF mildly reduced 45-50% with global HK. FINDINGS  Left Ventricle: Left ventricular ejection fraction, by estimation, is 45 to 50%. The left ventricle has mildly decreased function. The left ventricle demonstrates global hypokinesis. Definity contrast agent was given IV to delineate the left ventricular   endocardial borders. The left ventricular internal cavity size was normal in size. There is mild concentric left ventricular hypertrophy. Left ventricular diastolic parameters are consistent with Grade I diastolic dysfunction (impaired relaxation). Normal left ventricular filling pressure. Right Ventricle: The right ventricular size is normal. No increase in right ventricular wall thickness. Right ventricular systolic function is normal. Tricuspid regurgitation signal is inadequate for assessing PA pressure. Left Atrium: Left atrial size was normal in size. Right Atrium: Right atrial size was normal in size. Pericardium: Trivial pericardial effusion is present. Mitral Valve: The mitral valve is grossly normal. Trivial mitral valve regurgitation. No evidence of mitral valve stenosis. Tricuspid Valve: The tricuspid valve is grossly normal. Tricuspid valve regurgitation is trivial. No evidence of tricuspid stenosis. Aortic Valve: The aortic valve is tricuspid. Aortic valve regurgitation is not visualized. No aortic stenosis is present. Pulmonic Valve: The pulmonic valve was grossly normal. Pulmonic valve regurgitation is not visualized. No evidence of pulmonic stenosis. Aorta: The aortic root is normal in size and structure. Venous: The inferior vena cava is normal in size with greater than 50% respiratory variability, suggesting right atrial pressure of 3 mmHg. IAS/Shunts:  The atrial septum is grossly normal.  LEFT VENTRICLE PLAX 2D LVIDd:         5.70 cm      Diastology LVIDs:         4.40 cm      LV e' medial:    3.83 cm/s LV PW:         1.10 cm      LV E/e' medial:  10.6 LV IVS:        1.10 cm      LV e' lateral:   4.88 cm/s LVOT diam:     2.20 cm      LV E/e' lateral: 8.3 LV SV:         33 LV SV Index:   14 LVOT Area:     3.80 cm  LV Volumes (MOD) LV vol d, MOD A4C: 193.0 ml LV vol s, MOD A4C: 121.0 ml LV SV MOD A4C:     193.0 ml LEFT ATRIUM           Index LA diam:      4.50 cm 1.94 cm/m LA Vol (A2C): 52.7 ml  22.69 ml/m LA Vol (A4C): 46.7 ml 20.11 ml/m  AORTIC VALVE LVOT Vmax:   57.80 cm/s LVOT Vmean:  39.600 cm/s LVOT VTI:    0.086 m  AORTA Ao Root diam: 3.40 cm MITRAL VALVE MV Area (PHT): 2.18 cm    SHUNTS MV Decel Time: 348 msec    Systemic VTI:  0.09 m MV E velocity: 40.70 cm/s  Systemic Diam: 2.20 cm MV A velocity: 49.00 cm/s MV E/A ratio:  0.83 Lennie Odor MD Electronically signed by Lennie Odor MD Signature Date/Time: 03/12/2020/12:24:17 PM    Final    DG HIP UNILAT WITH PELVIS 2-3 VIEWS RIGHT  Result Date: 03/11/2020 CLINICAL DATA:  Fall EXAM: DG HIP (WITH OR WITHOUT PELVIS) 2-3V RIGHT COMPARISON:  None. FINDINGS: Image quality degraded by patient body habitus. Subcapital band of sclerosis and questionable transcortical lucency of the right femoral neck with possible impaction. Best seen on the frogleg lateral view. Femoral head remains normally located albeit severe arthrosis and bone-on-bone contact along the superior acetabular margin. Additional severe degenerative changes in the left hip as well. Both hips demonstrate extensive subcortical cystic change, sclerotic features and geode formations. More diffuse enthesopathic changes about the hips and pelvis. Degenerative features in the lower lumbar spine. Mild soft tissue swelling noted over the lateral right hip as well as mild diffuse body wall edema. IMPRESSION: 1. Subcapital band of sclerosis and questionable transcortical lucency of the right femoral neck, suspicious for minimally impacted subcapital femur fracture. Right lateral hip swelling. 2. 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 3. Additional enthesopathic changes pelvis and degenerative changes in the spine. 4. Mild body wall edema. Electronically Signed   By: Kreg Shropshire M.D.   On: 03/11/2020 21:37   CT HEAD CODE STROKE WO CONTRAST  Result Date: 03/12/2020 CLINICAL DATA:  Code stroke. 64 year old male with right side weakness and facial droop. EXAM: CT  HEAD WITHOUT CONTRAST TECHNIQUE: Contiguous axial images were obtained from the base of the skull through the vertex without intravenous contrast. COMPARISON:  None. FINDINGS: Brain: Scattered bilateral Patchy and confluent cerebral white matter hypodensity with similar heterogeneity in the left basal ganglia and bilateral thalami. No acute intracranial hemorrhage identified. No midline shift, mass effect, or evidence of intracranial mass lesion. No changes of acute cortically based infarct identified. Incidental cavum septum pellucidum (normal  variant). Vascular: Calcified atherosclerosis at the skull base. Hyperdense left MCA branch in the left sylvian fissure on series 3, image 20. Skull: No acute osseous abnormality identified. Sinuses/Orbits: Occasional sinus mucosal thickening or small retention cysts. Tympanic cavities and mastoids are well aerated. Other: Leftward gaze. No other acute orbit or scalp soft tissue finding. ASPECTS [facility] Stroke Program Early CT Score) Total score (0-10 with 10 being normal): 10 IMPRESSION: 1. Hyperdense Left MCA branch in the left Sylvian fissure suspicious for emergent large vessel occlusion. 2. No acute cortically based infarct or acute intracranial hemorrhage identified. ASPECTS 10. 3. Evidence of advanced underlying small vessel disease. 4. Study discussed by telephone with Dr. Otelia Limes on 03/12/2020 at 08:53 . Electronically Signed   By: Odessa Fleming M.D.   On: 03/12/2020 08:55     Scheduled Meds: . aspirin EC  81 mg Oral Daily  . carvedilol  25 mg Oral BID WC  . clopidogrel  75 mg Oral Daily  . furosemide  40 mg Intravenous BID  . insulin aspart  0-15 Units Subcutaneous TID AC & HS  . simvastatin  40 mg Oral Daily  . sodium chloride flush  3 mL Intravenous Q12H   Continuous Infusions: . sodium chloride       LOS: 1 day    Time spent: 35 mins    Aliany Fiorenza, MD [facility]   If 7PM-7AM, please contact night-coverage

## 2020-03-13 NOTE — Progress Notes (Signed)
Patient has periods of sleep apnea 10-20 seconds

## 2020-03-13 NOTE — Progress Notes (Signed)
Patient adiaphoric checked CBG 109.

## 2020-03-13 NOTE — Evaluation (Signed)
Physical Therapy Evaluation Patient Details Name: Trevor Anderson MRN: [id number] DOB: February 27, 1956 Today's Date: 03/13/2020   History of Present Illness  HPI: 64 year old male with past medical history of diabetes mellitus type 2, coronary artery disease (cath 2009 with nonobstructive disease), systolic and diastolic congestive heart failure, hyperlipidemia, hypertension and medical noncompliance who presented to [facility] emergency department 11/18 with complaints of shortness of breath. Code stroke called am 11/19 due to acute right sided weakness and dysarthria.  Clinical Impression  Pt with difficulty expressing himself, son present to answer questions during evaluation. Pt was I without AD prior to admission. Son states pt is unable to move the right side of his body. When assess therapist noted trace activation of R quad when pushing into therapist hand and trace activation of R glute. Performed neuro re education with quad activation and bridges with support in hooklying and manual cueing on glute for activation. Pt is demonstrating severe deficits in strength, tone, coordination, balance and endurance and will benefit from skilled PT to address deficits to maximize independence with functional mobility prior to discharge     Follow Up Recommendations CIR;Supervision/Assistance - 24 hour    Equipment Recommendations  Wheelchair (measurements PT);Wheelchair cushion (measurements PT);3in1 (PT);Hospital bed    Recommendations for Other Services OT consult;Rehab consult     Precautions / Restrictions Precautions Precautions: Fall Restrictions Weight Bearing Restrictions: No      Mobility  Bed Mobility Overal bed mobility: Needs Assistance Bed Mobility: Rolling;Supine to Sit;Sit to Supine Rolling: Mod assist   Supine to sit: HOB elevated;Mod assist Sit to supine: Mod assist   General bed mobility comments: pt able to bring LLE in and out of bed, pt able to use L UE on  bedrail to assist wtih supine<>sit    Transfers                    Ambulation/Gait                Stairs            Wheelchair Mobility    Modified Rankin (Stroke Patients Only) Modified Rankin (Stroke Patients Only) Pre-Morbid Rankin Score: No symptoms Modified Rankin: Severe disability     Balance Overall balance assessment: Needs assistance Sitting-balance support: Single extremity supported;Feet supported Sitting balance-Leahy Scale: Zero Sitting balance - Comments: initially pt with no sitting balance requiring max A to remain upright, pt pushing wtih LUE posterior and to R. therapist providing cueing to come forward to the L toward therapist, pt able to correct balance and maintain sitting 2 minutes wtih min A, pt became fatigued and begain pushing posterior and R again Postural control: Posterior lean;Right lateral lean;Other (comment) (pt pushing with LUE)                                   Pertinent Vitals/Pain Pain Assessment: 0-10 Pain Score: 7  Pain Location: R hip    Home Living Family/patient expects to be discharged to:: Private residence Living Arrangements: Children Available Help at Discharge: Family;Available PRN/intermittently Type of Home: House Home Access: Stairs to enter Entrance Stairs-Rails: Can reach both Entrance Stairs-Number of Steps: 3 Home Layout: One level Home Equipment: Cane - single point      Prior Function Level of Independence: Independent               Hand Dominance   Dominant Hand: Right  Extremity/Trunk Assessment   Upper Extremity Assessment Upper Extremity Assessment: Defer to OT evaluation    Lower Extremity Assessment Lower Extremity Assessment: RLE deficits/detail RLE Deficits / Details: trace activation of R quad and glute noted RLE Sensation: WNL RLE Coordination: decreased gross motor;decreased fine motor       Communication   Communication: Expressive  difficulties  Cognition Arousal/Alertness: Awake/alert Behavior During Therapy: Flat affect Overall Cognitive Status: Impaired/Different from baseline Area of Impairment: Following commands;Safety/judgement                       Following Commands: Follows one step commands inconsistently Safety/Judgement: Decreased awareness of safety;Decreased awareness of deficits            General Comments      Exercises     Assessment/Plan    PT Assessment Patient needs continued PT services  PT Problem List Decreased strength;Decreased mobility;Decreased safety awareness;Impaired tone;Decreased range of motion;Decreased coordination;Obesity;Decreased activity tolerance;Decreased cognition;Decreased balance;Decreased knowledge of use of DME;Pain       PT Treatment Interventions DME instruction;Therapeutic exercise;Wheelchair mobility training;Gait training;Balance training;Neuromuscular re-education;Functional mobility training;Therapeutic activities;Patient/family education    PT Goals (Current goals can be found in the Care Plan section)  Acute Rehab PT Goals Patient Stated Goal: none stated pt patient PT Goal Formulation: With patient/family Time For Goal Achievement: 03/27/20 Potential to Achieve Goals: Fair    Frequency Min 4X/week   Barriers to discharge        Co-evaluation               AM-PAC PT "6 Clicks" Mobility  Outcome Measure Help needed turning from your back to your side while in a flat bed without using bedrails?: A Lot Help needed moving from lying on your back to sitting on the side of a flat bed without using bedrails?: A Lot Help needed moving to and from a bed to a chair (including a wheelchair)?: Total Help needed standing up from a chair using your arms (e.g., wheelchair or bedside chair)?: Total Help needed to walk in hospital room?: Total Help needed climbing 3-5 steps with a railing? : Total 6 Click Score: 8    End of Session  Equipment Utilized During Treatment: Gait belt;Oxygen Activity Tolerance: Patient tolerated treatment well Patient left: in bed;with bed alarm set;with call bell/phone within reach;with family/visitor present Nurse Communication: Mobility status PT Visit Diagnosis: Unsteadiness on feet (R26.81);Other abnormalities of gait and mobility (R26.89);Muscle weakness (generalized) (M62.81);Hemiplegia and hemiparesis;Pain Hemiplegia - Right/Left: Right Hemiplegia - dominant/non-dominant: Dominant Hemiplegia - caused by: Cerebral infarction Pain - Right/Left: Right Pain - part of body: Hip    Time: 1130-1201 PT Time Calculation (min) (ACUTE ONLY): 31 min   Charges:   PT Evaluation $PT Eval Low Complexity: 1 Low PT Treatments $Neuromuscular Re-education: 8-22 mins        Ginette Otto, DPT Acute Rehabilitation Services [phone]  Lucretia Field 03/13/2020, 1:17 PM

## 2020-03-13 NOTE — Progress Notes (Signed)
Modified Barium Swallow Progress Note  Patient Details  Name: Trevor Anderson MRN: [id number] Date of Birth: [birth year]  Today's Date: 03/13/2020  Modified Barium Swallow completed.  Full report located under Chart Review in the Imaging Section.  Brief recommendations include the following:  Clinical Impression  Pt presents with a primary oral dysphagia with right CN VII and XII weakness with senory and motor impairment leading to difficult lingual manipulation of solids and right sided anterior bolus loss and residue. Oral phase contributes to mild pharyngeal dysphagia with premature spillage and at times, delayed swallow initiation. WIth thin liquids there are instances of silent frank penetration with ejection and trace sensed aspiration with ejection. Pt is dyspneic and mildy impulsive with poor trunk control for adequate positioning and impaired self feeding. Over the weekend recommend a conservative diet of puree and nectar thick liquids, though given full airway protection expect pt to upgrade to thin liquids under supervision. Recommend CIR, will f/u for tolerance of diet.    Swallow Evaluation Recommendations       SLP Diet Recommendations: Dysphagia 2 (Fine chop) solids;Nectar thick liquid   Liquid Administration via: Cup;Straw   Medication Administration: Whole meds with puree   Supervision: Patient able to self feed;Staff to assist with self feeding   Compensations: Minimize environmental distractions;Lingual sweep for clearance of pocketing;Monitor for anterior loss   Postural Changes: Seated upright at 90 degrees;Remain semi-upright after after feeds/meals (Comment)   Oral Care Recommendations: Oral care BID      Harlon Ditty, MA CCC-SLP  Acute Rehabilitation Services Pager [phone] Office [phone]   [name], Riley Nearing 03/13/2020,10:42 AM

## 2020-03-14 ENCOUNTER — Inpatient Hospital Stay (HOSPITAL_COMMUNITY): Payer: No Typology Code available for payment source

## 2020-03-14 DIAGNOSIS — I6330 Cerebral infarction due to thrombosis of unspecified cerebral artery: Secondary | ICD-10-CM | POA: Insufficient documentation

## 2020-03-14 LAB — CBC
HCT: 42.3 % (ref 39.0–52.0)
Hemoglobin: 13.6 g/dL (ref 13.0–17.0)
MCH: 31 pg (ref 26.0–34.0)
MCHC: 32.2 g/dL (ref 30.0–36.0)
MCV: 96.4 fL (ref 80.0–100.0)
Platelets: 297 10*3/uL (ref 150–400)
RBC: 4.39 MIL/uL (ref 4.22–5.81)
RDW: 13.5 % (ref 11.5–15.5)
WBC: 8.8 10*3/uL (ref 4.0–10.5)
nRBC: 0 % (ref 0.0–0.2)

## 2020-03-14 LAB — BASIC METABOLIC PANEL
Anion gap: 11 (ref 5–15)
BUN: 35 mg/dL — ABNORMAL HIGH (ref 8–23)
CO2: 24 mmol/L (ref 22–32)
Calcium: 8.4 mg/dL — ABNORMAL LOW (ref 8.9–10.3)
Chloride: 105 mmol/L (ref 98–111)
Creatinine, Ser: 2.16 mg/dL — ABNORMAL HIGH (ref 0.61–1.24)
GFR, Estimated: 34 mL/min — ABNORMAL LOW (ref >60)
Glucose, Bld: 123 mg/dL — ABNORMAL HIGH (ref 70–99)
Potassium: 3.2 mmol/L — ABNORMAL LOW (ref 3.5–5.1)
Sodium: 140 mmol/L (ref 135–145)

## 2020-03-14 LAB — PHOSPHORUS: Phosphorus: 4.6 mg/dL (ref 2.5–4.6)

## 2020-03-14 LAB — GLUCOSE, CAPILLARY
Glucose-Capillary: 112 mg/dL — ABNORMAL HIGH (ref 70–99)
Glucose-Capillary: 167 mg/dL — ABNORMAL HIGH (ref 70–99)

## 2020-03-14 LAB — MAGNESIUM: Magnesium: 2.1 mg/dL (ref 1.7–2.4)

## 2020-03-14 MED ORDER — POTASSIUM CHLORIDE 20 MEQ PO PACK
40.0000 meq | PACK | Freq: Once | ORAL | Status: AC
  Administered 2020-03-14: 40 meq via ORAL
  Filled 2020-03-14: qty 2

## 2020-03-14 NOTE — Plan of Care (Signed)

## 2020-03-14 NOTE — Progress Notes (Signed)
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 He reports shortness of breath is progressively getting worse. Patient also reports fall from his chair while watching TV resulting in right hip pain,  worse with weightbearing. Patient was found to be tachycardic and tachypneic, after an evaluation. Patient was started on intravenous heparin with a concern for possible PE. VQ scan is ordered to rule out PE due to worsening renal functions. Patient was admitted for acute on chronic CHF exacerbation, suspected PE, hypertensive urgency, right hip pain. VQ scan negative, heparin discontinued. Patient was found to have right-sided weakness, slurring of his speech and  facial droop during morning round. Code stroke was called. Patient was not deemed a candidate for TPA. Exact time of symptoms unknown. CT head without abnormality in brain parenchyma. Patient was seen by neurology recommended MRI of the brain, PT/ OT consult speech consult.  Echocardiogram.  PT recommended CIR   Assessment & Plan:   Principal Problem:   Acute on chronic systolic CHF (congestive heart failure) (HCC) Active Problems:   Type 2 diabetes mellitus without complication, with long-term current use of insulin (HCC)   Mixed hyperlipidemia   Coronary artery disease involving native coronary artery of native heart without angina pectoris   Hypertensive urgency   AKI (acute kidney injury) (HCC)   Fall at home, initial encounter   Right hip pain   Hypokalemia   Cerebral thrombosis with cerebral infarction  Right-sided weakness secondary to  the stroke. Patient is found to have right-sided weakness in  the morning round. Code Stroke called,  neurology evaluated the patient  CTA Head/ Neck : Negative for large vessel occlusion, and no core infarct or ischemic penumbra detected by CT Perfusion. Advised stroke pathway. Neurology recommended dual antiplatelet therapy for now (aspirin and Plavix) Speech and swallow evaluation completed recommended thick liquids. PT evaluation completed recommended CIR / SNF Neurology will follow.   Acute on chronic systolic CHF (congestive heart failure) (HCC)   Patient presented with 2-week history of paroxysmal nocturnal dyspnea and increasing abdominal girth with 1 week history of progressively worsening shortness of breath  BNP of 705.    Patient has been noncompliant with his antihypertensives likely precipitating acute flare.  Patient was started on Lasix 40 mg IV twice daily.  Strict input and output monitoring  Monitoring renal function electrolytes with serial chemistries  Monitoring patient on telemetry  Echocardiogram LVEF 45 to 50% , left ventricle has decreased function.  Global hypokinesis Due to ongoing tachycardia and relatively clear chest x-ray emergency department provider was also concerned about possible thromboembolic disease and placed the patient on heparin infusion.  VQ scan negative, heparin discontinued.     Hypertensive urgency >>> Improving   Patient presented with hypertensive urgency with blood pressures as high as 213/151.    Patient admits to "not always taking his blood pressure medications".   Per review of our records patient has not seen Dr. Jonny Ruiz ( PCP)  since January 2019.  Providing patient with as needed intravenous antihypertensives for systolic blood pressures above 200 at this time.  Targeting 25% blood pressure reduction overnight, around a systolic blood pressure of 180.  Blood pressure improved.  Placing patient in progressive unit for close monitoring    AKI (acute kidney injury)  [facility])  Patient presenting with creatinine of 2.11, an increase from 1.19 2 years ago  Because of patient's lack of outpatient follow-up it is unclear as to whether or not this elevated creatinine is due to acute kidney injury or progression of chronic kidney disease in the setting of uncontrolled blood pressure.  We will manage blood pressure slowly with IV and po Meds  Strict input and output monitoring  Monitoring renal function and electrolytes with serial chemistries  Renal ultrasound unremarkable.    Fall at home, initial encounter  Patient reports suffering a fall at home earlier in the day on 11/18.  Patient complains of right hip pain .  X-ray of the right hip reveals a possible femur fracture -however upon discussion of these images between the emergency department provider and Dr. Cyndia Bent orthopedic surgery disagrees with this read and recommends more advanced imaging.    CT hip shows unremarkable,  no fracture noted.     Type 2 diabetes mellitus without complication, with long-term current use of insulin (HCC)   Accu-Cheks before every meal and nightly with signs scale insulin  Hemoglobin A1c : 6.8  Hold Metformin    Coronary artery disease involving native coronary artery of native heart without angina pectoris   Continue aspirin and beta-blocker, Plavix was added due to stroke.  Patient is chest pain-free  Monitoring patient on telemetry   EKG reveals no evidence of dynamic ST segment changes  Troponin trended down could be due to demand ischemia    Mixed hyperlipidemia    Question whether patient is actually taking statin at this time.  Patient states that he inconsistently takes any medications.  Continue Lipitor 40 mg     DVT prophylaxis: SCDs Code Status: Full Family Communication: spoke with son at bed side. Disposition Plan:  Status is: Inpatient  Remains inpatient appropriate because:Inpatient level of care appropriate due to  severity of illness   Dispo: The patient is from: Home              Anticipated d/c is to: SNF              Anticipated d/c date is: > 3 days              Patient currently is not medically stable to d/c.   Consultants:   Neurology  Procedures:  Antimicrobials:  Anti-infectives (From admission, onward)   None      Subjective: Patient was seen and examined at bedside.  Overnight events noted.  Patient has right-sided weakness.   Patient blood pressure has improved.  He appears better, he has significant weakness on the right side. Objective: Vitals:   03/14/20 0422 03/14/20 0700 03/14/20 0820 03/14/20 0828  BP: (!) 146/105 (!) 160/111 (!) 160/111   Pulse: 86 86 86 85  Resp: 14 20 (!) 34 20  Temp: 97.8 F (36.6 C) 98 F (36.7 C)    TempSrc: Oral Oral    SpO2:  100% 98% 100%  Weight: 110.8 kg     Height:        Intake/Output Summary (Last 24 hours) at 03/14/2020 1103 Last data filed at 03/14/2020 0200 Gross per 24 hour  Intake 250 ml  Output 1175 ml  Net -925 ml   Filed Weights   03/12/20 0216 03/13/20 0419 03/14/20 0422  Weight: 113.9 kg 111.6 kg 110.8 kg    Examination:  General  exam: Appears calm and comfortable , facial droop Respiratory system: Clear to auscultation. Respiratory effort normal. Cardiovascular system: S1 & S2 heard, RRR. No JVD, murmurs, rubs, gallops or clicks. No pedal edema. Gastrointestinal system: Abdomen is nondistended, soft and nontender. No organomegaly or masses felt. Normal bowel sounds heard. Central nervous system: Alert and oriented.  Facial droop, slurring of speech Extremities: Right-sided weakness, RUE 1/5 , RLE 1/5 Skin: No rashes, lesions or ulcers Psychiatry: Judgement and insight appear normal. Mood & affect appropriate.     Data Reviewed: I have personally reviewed following labs and imaging studies  CBC: Recent Labs  Lab 03/11/20 1854 03/12/20 0217 03/13/20 0129 03/14/20 0217  WBC 10.9* 12.4* 10.9* 8.8   HGB 12.6* 13.0 13.5 13.6  HCT 40.7 40.9 41.9 42.3  MCV 98.8 96.0 96.8 96.4  PLT 276 295 280 297   Basic Metabolic Panel: Recent Labs  Lab 03/11/20 1854 03/12/20 0217 03/13/20 0129 03/14/20 0217  NA 141  --  140 140  K 3.2*  --  3.4* 3.2*  CL 107  --  103 105  CO2 18*  --  23 24  GLUCOSE 146*  --  108* 123*  BUN 23  --  31* 35*  CREATININE 2.11*  --  2.38* 2.16*  CALCIUM 8.9  --  8.5* 8.4*  MG  --  1.8 2.0 2.1  PHOS  --   --  5.4* 4.6   GFR: Estimated Creatinine Clearance: 44.9 mL/min (A) (by C-G formula based on SCr of 2.16 mg/dL (H)). Liver Function Tests: No results for input(s): AST, ALT, ALKPHOS, BILITOT, PROT, ALBUMIN in the last 168 hours. No results for input(s): LIPASE, AMYLASE in the last 168 hours. No results for input(s): AMMONIA in the last 168 hours. Coagulation Profile: No results for input(s): INR, PROTIME in the last 168 hours. Cardiac Enzymes: No results for input(s): CKTOTAL, CKMB, CKMBINDEX, TROPONINI in the last 168 hours. BNP (last 3 results) No results for input(s): PROBNP in the last 8760 hours. HbA1C: Recent Labs    03/12/20 0217  HGBA1C 6.8*   CBG: Recent Labs  Lab 03/13/20 0605 03/13/20 1120 03/13/20 1651 03/13/20 2126 03/14/20 0613  GLUCAP 123* 145* 102* 128* 112*   Lipid Profile: Recent Labs    03/12/20 0217  CHOL 232*  HDL 46  LDLCALC 173*  TRIG 67  CHOLHDL 5.0   Thyroid Function Tests: No results for input(s): TSH, T4TOTAL, FREET4, T3FREE, THYROIDAB in the last 72 hours. Anemia Panel: No results for input(s): VITAMINB12, FOLATE, FERRITIN, TIBC, IRON, RETICCTPCT in the last 72 hours. Sepsis Labs: No results for input(s): PROCALCITON, LATICACIDVEN in the last 168 hours.  Recent Results (from the past 240 hour(s))  Respiratory Panel by RT PCR (Flu A&B, Covid) - Nasopharyngeal Swab     Status: None   Collection Time: 03/11/20  6:15 PM   Specimen: Nasopharyngeal Swab; Nasopharyngeal(NP) swabs in vial transport medium   Result Value Ref Range Status   SARS Coronavirus 2 by RT PCR NEGATIVE NEGATIVE Final    Comment: (NOTE) SARS-CoV-2 target nucleic acids are NOT DETECTED.  The SARS-CoV-2 RNA is generally detectable in upper respiratoy specimens during the acute phase of infection. The lowest concentration of SARS-CoV-2 viral copies this assay can detect is 131 copies/mL. A negative result does not preclude SARS-Cov-2 infection and should not be used as the sole basis for treatment or other patient management decisions. A negative result may occur with  improper specimen collection/handling, submission of specimen other than  nasopharyngeal swab, presence of viral mutation(s) within the areas targeted by this assay, and inadequate number of viral copies (<131 copies/mL). A negative result must be combined with clinical observations, patient history, and epidemiological information. The expected result is Negative.  Fact Sheet for Patients:  https://www.moore.com/  Fact Sheet for Healthcare Providers:  https://www.young.biz/  This test is no t yet approved or cleared by the [place] FDA and  has been authorized for detection and/or diagnosis of SARS-CoV-2 by FDA under an Emergency Use Authorization (EUA). This EUA will remain  in effect (meaning this test can be used) for the duration of the COVID-19 declaration under Section 564(b)(1) of the Act, 21 U.S.C. section 360bbb-3(b)(1), unless the authorization is terminated or revoked sooner.     Influenza A by PCR NEGATIVE NEGATIVE Final   Influenza B by PCR NEGATIVE NEGATIVE Final    Comment: (NOTE) The Xpert Xpress SARS-CoV-2/FLU/RSV assay is intended as an aid in  the diagnosis of influenza from Nasopharyngeal swab specimens and  should not be used as a sole basis for treatment. Nasal washings and  aspirates are unacceptable for Xpert Xpress SARS-CoV-2/FLU/RSV  testing.  Fact Sheet for  Patients: https://www.moore.com/  Fact Sheet for Healthcare Providers: https://www.young.biz/  This test is not yet approved or cleared by the [place] FDA and  has been authorized for detection and/or diagnosis of SARS-CoV-2 by  FDA under an Emergency Use Authorization (EUA). This EUA will remain  in effect (meaning this test can be used) for the duration of the  Covid-19 declaration under Section 564(b)(1) of the Act, 21  U.S.C. section 360bbb-3(b)(1), unless the authorization is  terminated or revoked. Performed at [facility], [address] [place], [place] [address]          Radiology Studies: NM Pulmonary Perfusion  Result Date: 03/12/2020 CLINICAL DATA:  PE suspected.  High probability. EXAM: NUCLEAR MEDICINE PERFUSION LUNG SCAN TECHNIQUE: Perfusion images were obtained in multiple projections after intravenous injection of radiopharmaceutical. Ventilation scans intentionally deferred if perfusion scan and chest x-ray adequate for interpretation during COVID 19 epidemic. RADIOPHARMACEUTICALS:  4.3 mCi Tc-59m MAA IV COMPARISON:  None. FINDINGS: No significant filling defects are present. Normal perfusion is present bilaterally. IMPRESSION: Normal V/Q scan.  No pulmonary embolus. Electronically Signed   By: Marin Roberts M.D.   On: 03/12/2020 13:44   DG Swallowing Func-Speech Pathology  Result Date: 03/13/2020 Objective Swallowing Evaluation: Type of Study: MBS-Modified Barium Swallow Study  Patient Details Name: RIHAN SCHUELER MRN: [id number] Date of Birth: 1955/05/14 Today's Date: 03/13/2020 Time: SLP Start Time (ACUTE ONLY): 1000 -SLP Stop Time (ACUTE ONLY): 1024 SLP Time Calculation (min) (ACUTE ONLY): 24 min Past Medical History: Past Medical History: Diagnosis Date . 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 CONGESTIVE HEART FAILURE 11/12/2007 . DIABETES MELLITUS, TYPE II 11/20/2008 . HYPERLIPIDEMIA 11/12/2007 . HYPERTENSION  11/12/2007 . TACHYCARDIA 01/24/2010 Past Surgical History: Past Surgical History: Procedure Laterality Date . TONSILLECTOMY  [id number] HPI: 64 year old male with past medical history of diabetes mellitus type 2, coronary artery disease (cath 2009 with nonobstructive disease), systolic and diastolic congestive heart failure, hyperlipidemia, hypertension and medical noncompliance who presented to [facility] department 11/18 with complaints of shortness of breath. Code stroke called am 11/19 due to acute right sided weakness and dysarthria.  W/u pending. Failed RN stroke swallow screen.  Subjective: alert Assessment / Plan / Recommendation CHL IP CLINICAL IMPRESSIONS 03/13/2020 Clinical Impression Pt presents with a primary oral dysphagia with right CN VII  and XII weakness with senory and motor impairment leading to difficult lingual manipulation of solids and right sided anterior bolus loss and residue. Oral phase contributes to mild pharyngeal dysphagia with premature spillage and at times, delayed swallow initiation. WIth thin liquids there are instances of silent frank penetration with ejection and trace sensed aspiration with ejection. Pt is dyspneic and mildy impulsive with poor trunk control for adequate positioning and impaired self feeding. Over the weekend recommend a conservative diet of puree and nectar thick liquids, though given full airway protection expect pt to upgrade to thin liquids under supervision. Recommend CIR, will f/u for tolerance of diet.  SLP Visit Diagnosis Dysphagia, oropharyngeal phase (R13.12) Attention and concentration deficit following -- Frontal lobe and executive function deficit following -- Impact on safety and function Moderate aspiration risk   CHL IP TREATMENT RECOMMENDATION 03/12/2020 Treatment Recommendations Therapy as outlined in treatment plan below   Prognosis 03/13/2020 Prognosis for Safe Diet Advancement Good Barriers to Reach Goals -- Barriers/Prognosis  Comment -- CHL IP DIET RECOMMENDATION 03/13/2020 SLP Diet Recommendations Dysphagia 2 (Fine chop) solids;Nectar thick liquid Liquid Administration via Cup;Straw Medication Administration Whole meds with puree Compensations Minimize environmental distractions;Lingual sweep for clearance of pocketing;Monitor for anterior loss Postural Changes Seated upright at 90 degrees;Remain semi-upright after after feeds/meals (Comment)   CHL IP OTHER RECOMMENDATIONS 03/13/2020 Recommended Consults -- Oral Care Recommendations Oral care BID Other Recommendations --   CHL IP FOLLOW UP RECOMMENDATIONS 03/13/2020 Follow up Recommendations Inpatient Rehab   CHL IP FREQUENCY AND DURATION 03/13/2020 Speech Therapy Frequency (ACUTE ONLY) min 2x/week Treatment Duration 1 week      CHL IP ORAL PHASE 03/13/2020 Oral Phase Impaired Oral - Pudding Teaspoon -- Oral - Pudding Cup -- Oral - Honey Teaspoon -- Oral - Honey Cup -- Oral - Nectar Teaspoon -- Oral - Nectar Cup Lingual/palatal residue;Right pocketing in lateral sulci Oral - Nectar Straw Lingual/palatal residue;Right pocketing in lateral sulci;Other (Comment) Oral - Thin Teaspoon -- Oral - Thin Cup Right anterior bolus loss;Right pocketing in lateral sulci;Lingual/palatal residue;Decreased bolus cohesion;Premature spillage;Other (Comment) Oral - Thin Straw Right anterior bolus loss;Right pocketing in lateral sulci;Lingual/palatal residue;Decreased bolus cohesion;Premature spillage;Other (Comment) Oral - Puree Right pocketing in lateral sulci;Lingual/palatal residue Oral - Mech Soft Decreased bolus cohesion;Lingual/palatal residue;Reduced posterior propulsion;Weak lingual manipulation;Impaired mastication Oral - Regular -- Oral - Multi-Consistency -- Oral - Pill WFL Oral Phase - Comment --  CHL IP PHARYNGEAL PHASE 03/13/2020 Pharyngeal Phase Impaired Pharyngeal- Pudding Teaspoon -- Pharyngeal -- Pharyngeal- Pudding Cup -- Pharyngeal -- Pharyngeal- Honey Teaspoon -- Pharyngeal --  Pharyngeal- Honey Cup -- Pharyngeal -- Pharyngeal- Nectar Teaspoon -- Pharyngeal -- Pharyngeal- Nectar Cup Delayed swallow initiation-pyriform sinuses;Delayed swallow initiation-vallecula Pharyngeal -- Pharyngeal- Nectar Straw Delayed swallow initiation-vallecula;Delayed swallow initiation-pyriform sinuses Pharyngeal -- Pharyngeal- Thin Teaspoon -- Pharyngeal -- Pharyngeal- Thin Cup Delayed swallow initiation-pyriform sinuses;Penetration/Aspiration before swallow Pharyngeal Material enters airway, CONTACTS cords and then ejected out;Material enters airway, passes BELOW cords then ejected out;Material does not enter airway Pharyngeal- Thin Straw Delayed swallow initiation-pyriform sinuses;Penetration/Aspiration before swallow Pharyngeal Material enters airway, CONTACTS cords and then ejected out;Material enters airway, passes BELOW cords then ejected out;Material does not enter airway Pharyngeal- Puree Delayed swallow initiation-vallecula Pharyngeal -- Pharyngeal- Mechanical Soft Delayed swallow initiation-vallecula Pharyngeal -- Pharyngeal- Regular -- Pharyngeal -- Pharyngeal- Multi-consistency -- Pharyngeal -- Pharyngeal- Pill Delayed swallow initiation-vallecula Pharyngeal -- Pharyngeal Comment --  No flowsheet data found. Harlon Ditty, MA CCC-SLP Acute Rehabilitation Services Pager [phone] Office [phone] Claudine Mouton 03/13/2020, 10:43 AM  Scheduled Meds: . aspirin EC  81 mg Oral Daily  . carvedilol  25 mg Oral BID WC  . clopidogrel  75 mg Oral Daily  . furosemide  40 mg Intravenous BID  . insulin aspart  0-15 Units Subcutaneous TID AC & HS  . simvastatin  40 mg Oral Daily  . sodium chloride flush  3 mL Intravenous Q12H   Continuous Infusions: . sodium chloride       LOS: 2 days    Time spent: 25 mins    Ashantee Deupree, MD [facility]   If 7PM-7AM, please contact night-coverage

## 2020-03-14 NOTE — Progress Notes (Signed)
STROKE TEAM PROGRESS NOTE   HISTORY OF PRESENT ILLNESS (per record) Trevor Anderson is an 64 y.o. male with a PMHx of obesity, DM2, CAD, systolic and diastolic CHF, HLD, HTN and medical noncompliance who presented to the [facility] ED yesterday with a 2 week history of orthopnea at night that then progressed to SOB during the day, which gradually became more severe. The SOB was worse with exertion and improved with rest. He then developed increased abdominal girth, but did not have any significant leg edema. He noted that he had developed a mild nonproductive cough attributed to a "cold" several weeks ago. He has been vaccinated for Covid. Of note, he fell out of his chair on 11/18 resulting in severe right hip pain that was worse with weightbearing.   He was tachypneic and tachycardic on presentation to the ED. There was concern for PE versus CHF exacerbation. He was started on IV heparin and Lasix was administered prior to admission.   This AM during Hospitalist rounds, he was informed by RN that the patient seemed to have significant right sided weakness. When hospitalist evaluated the patient, prominent right facial droop and dysarthria were also noted. Code Stroke was called.   On further discussion with RN taking care of the patient, at about 2 AM, during sign out from ED RN to floor RN, right sided weakness was described, per verbal report from RN this AM. Official note from admitting Hospitalist did not document weakness however. On detailed interview with the patient in CT, he states that he first started to feel weak at about 5 to 6 PM yesterday.   LSN: 5-6 PM on Thursday per patient. Best estimate for LKN is 5:30 PM. tPA Given: No: Out of the time window NIHSS: 10   INTERVAL HISTORY The patient son is at the bedside.  The patient presented about 3 to 4 days ago on Thursday with 2-week history of dyspnea but with apparent acute worsening.  It appeared that the patient also had developed  right  leg weakness at that time and some dysarthria at that time in retrospect.  This became evidence on rounding yesterday by the admitting team.    OBJECTIVE Vitals:   03/14/20 0031 03/14/20 0200 03/14/20 0357 03/14/20 0422  BP: (!) 167/117 (!) 149/79 (!) 146/105 (!) 146/105  Pulse: 87 84  86  Resp: 16 20 20 14   Temp: 98.6 F (37 C)  97.8 F (36.6 C) 97.8 F (36.6 C)  TempSrc: Tympanic  Oral Oral  SpO2: 99% 98%    Weight:    110.8 kg  Height:        CBC:  Recent Labs  Lab 03/13/20 0129 03/14/20 0217  WBC 10.9* 8.8  HGB 13.5 13.6  HCT 41.9 42.3  MCV 96.8 96.4  PLT 280 297    Basic Metabolic Panel:  Recent Labs  Lab 03/13/20 0129 03/14/20 0217  NA 140 140  K 3.4* 3.2*  CL 103 105  CO2 23 24  GLUCOSE 108* 123*  BUN 31* 35*  CREATININE 2.38* 2.16*  CALCIUM 8.5* 8.4*  MG 2.0 2.1  PHOS 5.4* 4.6    Lipid Panel:     Component Value Date/Time   CHOL 232 (H) 03/12/2020 0217   TRIG 67 03/12/2020 0217   HDL 46 03/12/2020 0217   CHOLHDL 5.0 03/12/2020 0217   VLDL 13 03/12/2020 0217   LDLCALC 173 (H) 03/12/2020 0217   HgbA1c:  Lab Results  Component Value Date   HGBA1C  6.8 (H) 03/12/2020   Urine Drug Screen:     Component Value Date/Time   LABOPIA NONE DETECTED 08/21/2007 1200   COCAINSCRNUR NONE DETECTED 08/21/2007 1200   LABBENZ NONE DETECTED 08/21/2007 1200   AMPHETMU NONE DETECTED 08/21/2007 1200   THCU NONE DETECTED 08/21/2007 1200   LABBARB  08/21/2007 1200    NONE DETECTED        DRUG SCREEN FOR MEDICAL PURPOSES ONLY.  IF CONFIRMATION IS NEEDED FOR ANY PURPOSE, NOTIFY LAB WITHIN 5 DAYS.    Alcohol Level No results found for: [facility]  IMAGING  CT Code Stroke CTA Head W/WO contrast CT Code Stroke CTA Neck W/WO contrast CT Code Stroke Cerebral Perfusion with contrast 03/12/2020 IMPRESSION:  1. Negative for large vessel occlusion, and no core infarct or ischemic penumbra detected by CT Perfusion.  2. Mild for age extracranial but fairly advanced  intracranial atherosclerosis. Up to moderate circle-of-Willis branch irregularity and stenoses in the bilateral MCAs (M2 and M3), left PCA (P2), and distal ACAs  .   CT HEAD CODE STROKE WO CONTRAST 03/12/2020 IMPRESSION:  1. Hyperdense Left MCA branch in the left Sylvian fissure suspicious for emergent large vessel occlusion.  2. No acute cortically based infarct or acute intracranial hemorrhage identified. ASPECTS 10.  3. Evidence of advanced underlying small vessel disease.  NM Pulmonary Perfusion 03/12/2020 IMPRESSION:  Normal V/Q scan.  No pulmonary embolus.   DG Swallowing Func-Speech Pathology 03/13/2020 Objective Swallowing Evaluation: Type of Study: MBS-Modified Barium Swallow Study  Patient Details Name: Trevor Anderson MRN: [id number] Date of Birth: [birth year] Today's Date: 03/13/2020 Time: SLP Start Time (ACUTE ONLY): 1000 -SLP Stop Time (ACUTE ONLY): 1024 SLP Time Calculation (min) (ACUTE ONLY): 24 min Past Medical History: Past Medical History: Diagnosis Date . 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 CONGESTIVE HEART FAILURE 11/12/2007 . DIABETES MELLITUS, TYPE II 11/20/2008 . HYPERLIPIDEMIA 11/12/2007 . HYPERTENSION 11/12/2007 . TACHYCARDIA 01/24/2010 Past Surgical History: Past Surgical History: Procedure Laterality Date . TONSILLECTOMY  6152 HPI: 64 year old male with past medical history of diabetes mellitus type 2, coronary artery disease (cath 2009 with nonobstructive disease), systolic and diastolic congestive heart failure, hyperlipidemia, hypertension and medical noncompliance who presented to [facility] emergency department 11/18 with complaints of shortness of breath. Code stroke called am 11/19 due to acute right sided weakness and dysarthria.  W/u pending. Failed RN stroke swallow screen.  Subjective: alert Assessment / Plan / Recommendation CHL IP CLINICAL IMPRESSIONS 03/13/2020 Clinical Impression Pt presents with a primary oral dysphagia with right CN VII and XII weakness  with senory and motor impairment leading to difficult lingual manipulation of solids and right sided anterior bolus loss and residue. Oral phase contributes to mild pharyngeal dysphagia with premature spillage and at times, delayed swallow initiation. WIth thin liquids there are instances of silent frank penetration with ejection and trace sensed aspiration with ejection. Pt is dyspneic and mildy impulsive with poor trunk control for adequate positioning and impaired self feeding. Over the weekend recommend a conservative diet of puree and nectar thick liquids, though given full airway protection expect pt to upgrade to thin liquids under supervision. Recommend CIR, will f/u for tolerance of diet.  SLP Visit Diagnosis Dysphagia, oropharyngeal phase (R13.12) Attention and concentration deficit following -- Frontal lobe and executive function deficit following -- Impact on safety and function Moderate aspiration risk   CHL IP TREATMENT RECOMMENDATION 03/12/2020 Treatment Recommendations Therapy as outlined in treatment plan below   Prognosis 03/13/2020 Prognosis for Safe Diet Advancement Good Barriers to  Reach Goals -- Barriers/Prognosis Comment -- CHL IP DIET RECOMMENDATION 03/13/2020 SLP Diet Recommendations Dysphagia 2 (Fine chop) solids;Nectar thick liquid Liquid Administration via Cup;Straw Medication Administration Whole meds with puree Compensations Minimize environmental distractions;Lingual sweep for clearance of pocketing;Monitor for anterior loss Postural Changes Seated upright at 90 degrees;Remain semi-upright after after feeds/meals (Comment)   CHL IP OTHER RECOMMENDATIONS 03/13/2020 Recommended Consults -- Oral Care Recommendations Oral care BID Other Recommendations --   CHL IP FOLLOW UP RECOMMENDATIONS 03/13/2020 Follow up Recommendations Inpatient Rehab   CHL IP FREQUENCY AND DURATION 03/13/2020 Speech Therapy Frequency (ACUTE ONLY) min 2x/week Treatment Duration 1 week      CHL IP ORAL PHASE  03/13/2020 Oral Phase Impaired Oral - Pudding Teaspoon -- Oral - Pudding Cup -- Oral - Honey Teaspoon -- Oral - Honey Cup -- Oral - Nectar Teaspoon -- Oral - Nectar Cup Lingual/palatal residue;Right pocketing in lateral sulci Oral - Nectar Straw Lingual/palatal residue;Right pocketing in lateral sulci;Other (Comment) Oral - Thin Teaspoon -- Oral - Thin Cup Right anterior bolus loss;Right pocketing in lateral sulci;Lingual/palatal residue;Decreased bolus cohesion;Premature spillage;Other (Comment) Oral - Thin Straw Right anterior bolus loss;Right pocketing in lateral sulci;Lingual/palatal residue;Decreased bolus cohesion;Premature spillage;Other (Comment) Oral - Puree Right pocketing in lateral sulci;Lingual/palatal residue Oral - Mech Soft Decreased bolus cohesion;Lingual/palatal residue;Reduced posterior propulsion;Weak lingual manipulation;Impaired mastication Oral - Regular -- Oral - Multi-Consistency -- Oral - Pill WFL Oral Phase - Comment --  CHL IP PHARYNGEAL PHASE 03/13/2020 Pharyngeal Phase Impaired Pharyngeal- Pudding Teaspoon -- Pharyngeal -- Pharyngeal- Pudding Cup -- Pharyngeal -- Pharyngeal- Honey Teaspoon -- Pharyngeal -- Pharyngeal- Honey Cup -- Pharyngeal -- Pharyngeal- Nectar Teaspoon -- Pharyngeal -- Pharyngeal- Nectar Cup Delayed swallow initiation-pyriform sinuses;Delayed swallow initiation-vallecula Pharyngeal -- Pharyngeal- Nectar Straw Delayed swallow initiation-vallecula;Delayed swallow initiation-pyriform sinuses Pharyngeal -- Pharyngeal- Thin Teaspoon -- Pharyngeal -- Pharyngeal- Thin Cup Delayed swallow initiation-pyriform sinuses;Penetration/Aspiration before swallow Pharyngeal Material enters airway, CONTACTS cords and then ejected out;Material enters airway, passes BELOW cords then ejected out;Material does not enter airway Pharyngeal- Thin Straw Delayed swallow initiation-pyriform sinuses;Penetration/Aspiration before swallow Pharyngeal Material enters airway, CONTACTS cords and then  ejected out;Material enters airway, passes BELOW cords then ejected out;Material does not enter airway Pharyngeal- Puree Delayed swallow initiation-vallecula Pharyngeal -- Pharyngeal- Mechanical Soft Delayed swallow initiation-vallecula Pharyngeal -- Pharyngeal- Regular -- Pharyngeal -- Pharyngeal- Multi-consistency -- Pharyngeal -- Pharyngeal- Pill Delayed swallow initiation-vallecula Pharyngeal -- Pharyngeal Comment --  No flowsheet data found. Harlon Ditty, MA CCC-SLP Acute Rehabilitation Services Pager [phone] Office [phone] Claudine Mouton 03/13/2020, 10:43 AM              ECHOCARDIOGRAM COMPLETE 03/12/2020 IMPRESSIONS   1. Left ventricular ejection fraction, by estimation, is 45 to 50%. The left ventricle has mildly decreased function. The left ventricle demonstrates global hypokinesis. There is mild concentric left ventricular hypertrophy. Left ventricular diastolic parameters are consistent with Grade I diastolic dysfunction (impaired relaxation).   2. Right ventricular systolic function is normal. The right ventricular size is normal. Tricuspid regurgitation signal is inadequate for assessing PA pressure.   3. The mitral valve is grossly normal. Trivial mitral valve regurgitation. No evidence of mitral stenosis.   4. The aortic valve is tricuspid. Aortic valve regurgitation is not visualized. No aortic stenosis is present.   5. The inferior vena cava is normal in size with greater than 50% respiratory variability, suggesting right atrial pressure of 3 mmHg. Comparison(s): Changes from prior study are noted. EF mildly reduced 45-50% with global HK.   ECG - ST rate 125  BPM. (See cardiology reading for complete details)  PHYSICAL EXAM Blood pressure (!) 146/105, pulse 86, temperature 97.8 F (36.6 C), temperature source Oral, resp. rate 14, height 5\' 11"  (1.803 m), weight 110.8 kg, SpO2 98 %. GENERAL: He is mildly dyspneic and with increased respiratory rate.  HEENT:  Supple no trauma noted  ABDOMEN: soft  EXTREMITIES: No edema   BACK: Normal  SKIN: Normal by inspection.    MENTAL STATUS: He is awake and responsive and cooperates with evaluation.  He has moderate to severe dysarthria.  No clear aphasia noted.  CRANIAL NERVES: Pupils are equal, round and reactive to light and accomodation; extra ocular movements are full, there is no significant nystagmus; visual fields are full;  there is plegia of the right lower facial muscles; tongue deviates to the right; uvula is midline  MOTOR: Flaccid right hemiplegia.  Left side shows normal tone, bulk and strength.  COORDINATION: No dysmetria, tremor or parkinsonism noted.  No myoclonus.  SENSATION: Normal to light touch, temperature, and pain.               ASSESSMENT/PLAN Trevor Anderson is a 64 y.o. male with history of obesity, DM2, CAD, systolic and diastolic CHF, HLD, HTN and medical noncompliance who presented to the [facility] ED 11/17 with a 2 week history of orthopnea, SOB, increased abdominal girth, and a mild nonproductive cough. On the morning of 11/18 the RN noted that the patient seemed to have significant right sided weakness. He was later found to have a prominent right facial droop and dysarthria.  NIHSS was 10. Code Stroke was called. He did not receive IV t-PA due to being out of the therapeutic window for treatment.  Possible TIA:   Resultant moderate to severe dysarthria, right hemiplegia, right lower facial weakness and tongue deviation to the right.  Code Stroke CT Head - Hyperdense Left MCA branch in the left Sylvian fissure suspicious for emergent large vessel occlusion. No acute cortically based infarct or acute intracranial hemorrhage identified. ASPECTS 10. Evidence of advanced underlying small vessel disease.  CT head - not ordered  MRI head - pending  MRA head - not ordered   CTA H&N - Mild for age extracranial but fairly advanced intracranial atherosclerosis. Up to  moderate circle-of-Willis branch irregularity and stenoses in the bilateral MCAs (M2 and M3), left PCA (P2), and distal ACAs   CT Perfusion - no core infarct or ischemic penumbra detected by CT Perfusion.   Carotid Doppler - CTA neck ordered - carotid dopplers not indicated.  2D Echo - EF 45 - 50%. No cardiac source of emboli identified.   Sars Corona Virus 2 - negative  LDL - 173  HgbA1c - 6.8  UDS - not ordered  VTE prophylaxis - none Diet  Diet Order            DIET - DYS 1 Room service appropriate? Yes; Fluid consistency: Nectar Thick  Diet effective now                 aspirin 81 mg daily prior to admission, now on aspirin 81 mg daily and clopidogrel 75 mg daily  Patient counseled to be compliant with his antithrombotic medications  Ongoing aggressive stroke risk factor management  Therapy recommendations:  Skilled Nursing Facility placement recommended by therapists  Disposition:  Pending  Hypertension  Home BP meds: Coreg ; Norvasc ; Lisinopril ; hydralazine  Current BP meds: Coreg  Stable . Permissive hypertension (OK if < 220/120)  but gradually normalize in 5-7 days  . Long-term BP goal normotensive  Hyperlipidemia  Home Lipid lowering medication: Zocor 40 mg daily  LDL 173, goal < 70  Current lipid lowering medication: Zocor 40 mg daily  Continue statin at discharge  Diabetes  Home diabetic meds: insulin ; metformin  Current diabetic meds: SSI   HgbA1c 6.8, goal < 7.0 Recent Labs    03/13/20 1651 03/13/20 2126 03/14/20 0613  GLUCAP 102* 128* 112*    Other Stroke Risk Factors  Advanced age  ETOH use, advised to drink no more than 1 alcoholic beverage per day.  Obesity, Body mass index is 34.07 kg/m., recommend weight loss, diet and exercise as appropriate   Hx of medical non compliance  Coronary artery disease  Congestive Heart Failure  Other Active Problems  Code status - Full code  Hypokalemia - potassium - 3.2 -  supplemented  Acute vs CKD - creatinine - 2.16   Hospital day # 2  Dr. Gerilyn Pilgrim -- The patient was seen and examined by me; notes, chart and tests reviewed and discussed with the practice provider, other providers, patient, and family.     To contact Stroke Continuity provider, please refer to [email or web address]. After hours, contact General Neurology

## 2020-03-14 NOTE — Progress Notes (Signed)
Mobility Specialist - Progress Note   03/14/20 1616  Mobility  Activity  (ROM exercises)  Mobility Response Tolerated well  Mobility performed by Mobility specialist  $Mobility charge 1 Mobility   Guided pt's RUE and RLE through ROM exercises, both limbs felt flaccid. VSS. Pt was able to move his left extremities w/o difficulty.   Mamie Levers Mobility Specialist Mobility Specialist Phone: [phone]

## 2020-03-15 LAB — CBC
HCT: 43.5 % (ref 39.0–52.0)
Hemoglobin: 13.9 g/dL (ref 13.0–17.0)
MCH: 30.5 pg (ref 26.0–34.0)
MCHC: 32 g/dL (ref 30.0–36.0)
MCV: 95.4 fL (ref 80.0–100.0)
Platelets: 280 10*3/uL (ref 150–400)
RBC: 4.56 MIL/uL (ref 4.22–5.81)
RDW: 13.6 % (ref 11.5–15.5)
WBC: 7.5 10*3/uL (ref 4.0–10.5)
nRBC: 0 % (ref 0.0–0.2)

## 2020-03-15 LAB — GLUCOSE, CAPILLARY
Glucose-Capillary: 130 mg/dL — ABNORMAL HIGH (ref 70–99)
Glucose-Capillary: 154 mg/dL — ABNORMAL HIGH (ref 70–99)
Glucose-Capillary: 103 mg/dL — ABNORMAL HIGH (ref 70–99)
Glucose-Capillary: 138 mg/dL — ABNORMAL HIGH (ref 70–99)

## 2020-03-15 LAB — BASIC METABOLIC PANEL
Anion gap: 11 (ref 5–15)
BUN: 36 mg/dL — ABNORMAL HIGH (ref 8–23)
CO2: 25 mmol/L (ref 22–32)
Calcium: 8.5 mg/dL — ABNORMAL LOW (ref 8.9–10.3)
Chloride: 106 mmol/L (ref 98–111)
Creatinine, Ser: 2.09 mg/dL — ABNORMAL HIGH (ref 0.61–1.24)
GFR, Estimated: 35 mL/min — ABNORMAL LOW (ref >60)
Glucose, Bld: 98 mg/dL (ref 70–99)
Potassium: 3.2 mmol/L — ABNORMAL LOW (ref 3.5–5.1)
Sodium: 142 mmol/L (ref 135–145)

## 2020-03-15 MED ORDER — POTASSIUM CHLORIDE 20 MEQ PO PACK
40.0000 meq | PACK | Freq: Once | ORAL | Status: AC
  Administered 2020-03-15: 40 meq via ORAL
  Filled 2020-03-15: qty 2

## 2020-03-15 NOTE — TOC Initial Note (Signed)
Transition of Care [facility]) - Initial/Assessment Note    Patient Details  Name: Trevor Anderson MRN: [id number] Date of Birth: 03-07-1956  Transition of Care [facility]) CM/SW Contact:    Eduard Roux, LCSWA Phone Number: 03/15/2020, 5:33 PM  Clinical Narrative:                  CSW visit patient at bedside. CSW introduced self and explained role. CSW discussed short term rehab at [facility]. Patient states he was agreeable to rehab at [facility]. Patient states he has son lives in the home with him. CSW was given permission to talk with his son.  CSW spoke with patient's son, Abrham- he was agreeable to SNF.  He states the patient was independent prior to being admitted into the hospital. He states the patient's only insurance is [place]. His income is retirement and [employer]. He has been vaccinated.  CSW called-spoke with [facility] [place] - she informed CSW patient is non-service connected and is not being seen in any [facility] clinic.  It may be possible for patient patient to get approval for [facility] contract for SNF placement- Lorayne Marek was emailing paperwork but CSW has not received yet- CSW called [phone] ext [phone], lefty voice message , needs forms.  Per CIR- they are following from a distance- monitoring patient's progress and tolerance of therapy CSW will follow up with [facility] tomorrow  CSW will continue to follow and assist with discharge planning.  Antony Blackbird, MSW, LCSWA Clinical Social Worker   Expected Discharge Plan: Skilled Nursing Facility Barriers to Discharge: Continued Medical Work up, SNF Pending bed offer, Inadequate or no insurance   Patient Goals and CMS Choice        Expected Discharge Plan and Services Expected Discharge Plan: Skilled Nursing Facility In-house Referral: Clinical Social Work                                            Prior Living Arrangements/Services     Patient language and need for interpreter reviewed:: No        Need for Family  Participation in Patient Care: Yes (Comment) Care giver support system in place?: Yes (comment)   Criminal Activity/Legal Involvement Pertinent to Current Situation/Hospitalization: No - Comment as needed  Activities of Daily Living      Permission Sought/Granted Permission sought to share information with : Family Supports Permission granted to share information with : Yes, Verbal Permission Granted  Share Information with NAME: Brannan Cassedy  Permission granted to share info w AGENCY: SNFs  Permission granted to share info w Relationship: son  Permission granted to share info w Contact Information: [phone]  Emotional Assessment Appearance:: Appears stated age   Affect (typically observed): Accepting Orientation: : Oriented to Self, Oriented to Place, Oriented to  Time, Oriented to Situation Alcohol / Substance Use: Not Applicable Psych Involvement: No (comment)  Admission diagnosis:  Shortness of breath [R06.02] SOB (shortness of breath) [R06.02] Pain [R52] Right hip pain [M25.551] Hypertensive urgency [I16.0] Patient Active Problem List   Diagnosis Date Noted  . 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  . Hypertensive urgency 03/12/2020  . Acute on chronic systolic CHF (congestive heart failure) (HCC) 03/12/2020  . AKI (acute kidney injury) (HCC) 03/12/2020  . Fall at home, initial encounter 03/12/2020  . Right hip pain 03/12/2020  . Hypokalemia 03/12/2020  . Coronary artery disease  involving native coronary artery of native heart without angina pectoris 09/07/2014  . Renal insufficiency 12/02/2012  . Preventative health care 11/13/2010  . 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 TACHYCARDIA 01/24/2010  . Type 2 diabetes mellitus without complication, with long-term current use of insulin (HCC) 11/20/2008  . Mixed hyperlipidemia 11/12/2007  . Essential hypertension 11/12/2007  . CONGESTIVE HEART FAILURE 11/12/2007   PCP:  [name],  MD Pharmacy:   [facility] [address] [place]), [place] - [address] [place] ([place]) [place] [address] Phone: [phone] Fax: [phone]     Social Determinants of Health (SDOH) Interventions    Readmission Risk Interventions No flowsheet data found.

## 2020-03-15 NOTE — Progress Notes (Signed)
  Speech Language Pathology Treatment: Dysphagia  Patient Details Name: DIONDRE PULIS MRN: [id number] DOB: 02-25-1956 Today's Date: 03/15/2020 Time: 1040-1055 SLP Time Calculation (min) (ACUTE ONLY): 15 min  Assessment / Plan / Recommendation Clinical Impression  Pt was seen for dysphagia treatment with her brother present. He was alert and cooperative troughout the session. Pt initially tolerated individual sips of thin liquids via cup and straw without overt s/sx of aspiration. Coughing was initially only noted when cueing was not provided and pt demonstrated consecutive swallows; however, pt became more dyspneic as the session progressed and became symptomatic of aspiration with even smaller individual sips of thin liquids. Mastication of dysphagia 2 solids was adequate and oral residue was reduced to a more functional level with a liquid wash. Pt's diet will be advanced to dysphagia 2 solids; nectar thick liquids will be continued at this time since dyspnea appears to increase laryngeal invasion even when swallowing precautions are observed. Full supervision will be needed to facilitate observance of swallowing precautions.    HPI HPI: 64 year old male with past medical history of diabetes mellitus type 2, coronary artery disease (cath 2009 with nonobstructive disease), systolic and diastolic congestive heart failure, hyperlipidemia, hypertension and medical noncompliance who presented to [facility] emergency department 11/18 with complaints of shortness of breath. Code stroke called am 11/19 due to acute right sided weakness and dysarthria.  Failed RN stroke swallow screen. MRI brain:11/22: Moderate size acute infarction involving the left basal ganglia and adjacent white matter. Additional small acute infarcts involving the right corona radiata and basal ganglia and left paramedian cerebellum.      SLP Plan  Continue with current plan of care       Recommendations  Diet  recommendations: Dysphagia 2 (fine chop);Nectar-thick liquid Liquids provided via: Cup;Straw Medication Administration: Crushed with puree Supervision: Full supervision/cueing for compensatory strategies;Staff to assist with self feeding Compensations: Minimize environmental distractions;Lingual sweep for clearance of pocketing;Monitor for anterior loss;Follow solids with liquid Postural Changes and/or Swallow Maneuvers: Seated upright 90 degrees                Oral Care Recommendations: Oral care BID Follow up Recommendations: Inpatient Rehab SLP Visit Diagnosis: Dysphagia, oropharyngeal phase (R13.12) Plan: Continue with current plan of care       Regla Fitzgibbon I. Vear Clock, MS, CCC-SLP Acute Rehabilitation Services Office number [phone] Pager [phone]                Scheryl Marten 03/15/2020, 11:27 AM

## 2020-03-15 NOTE — Progress Notes (Signed)
STROKE TEAM PROGRESS NOTE    INTERVAL HISTORY The patient`s  brother is at the bedside.   The patient continues to have significant dysarthria and dense right hemiplegia.  MRI scan shows a moderate size acute infarct involving left basal ganglia and adjacent white matter as well as additional mall infarcts involving right corona radiata, basal ganglia and left cerebellum.  Echocardiogram shows slightly diminished ejection fraction of 45 to 50% but no definite clot.  Patient denies any prior history of atrial fibrillation or significant cardiac disease.  He has never been evaluated for sleep apnea but appears to be at high risk due to body habitus   OBJECTIVE Vitals:   03/15/20 0347 03/15/20 0634 03/15/20 0915 03/15/20 1144  BP: (!) 160/103  (!) 124/106 (!) 133/107  Pulse: 87 86 87 88  Resp: (!) 22 17 16 16   Temp: 97.9 F (36.6 C)  97.8 F (36.6 C) 98.4 F (36.9 C)  TempSrc: Oral  Oral Oral  SpO2: 100% 97% 98% 92%  Weight:  110 kg    Height:        CBC:  Recent Labs  Lab 03/14/20 0217 03/15/20 0117  WBC 8.8 7.5  HGB 13.6 13.9  HCT 42.3 43.5  MCV 96.4 95.4  PLT 297 280    Basic Metabolic Panel:  Recent Labs  Lab 03/13/20 0129 03/13/20 0129 03/14/20 0217 03/15/20 0117  NA 140   < > 140 142  K 3.4*   < > 3.2* 3.2*  CL 103   < > 105 106  CO2 23   < > 24 25  GLUCOSE 108*   < > 123* 98  BUN 31*   < > 35* 36*  CREATININE 2.38*   < > 2.16* 2.09*  CALCIUM 8.5*   < > 8.4* 8.5*  MG 2.0  --  2.1  --   PHOS 5.4*  --  4.6  --    < > = values in this interval not displayed.    Lipid Panel:     Component Value Date/Time   CHOL 232 (H) 03/12/2020 0217   TRIG 67 03/12/2020 0217   HDL 46 03/12/2020 0217   CHOLHDL 5.0 03/12/2020 0217   VLDL 13 03/12/2020 0217   LDLCALC 173 (H) 03/12/2020 0217   HgbA1c:  Lab Results  Component Value Date   HGBA1C 6.8 (H) 03/12/2020   Urine Drug Screen:     Component Value Date/Time   LABOPIA NONE DETECTED 08/21/2007 1200    COCAINSCRNUR NONE DETECTED 08/21/2007 1200   LABBENZ NONE DETECTED 08/21/2007 1200   AMPHETMU NONE DETECTED 08/21/2007 1200   THCU NONE DETECTED 08/21/2007 1200   LABBARB  08/21/2007 1200    NONE DETECTED        DRUG SCREEN FOR MEDICAL PURPOSES ONLY.  IF CONFIRMATION IS NEEDED FOR ANY PURPOSE, NOTIFY LAB WITHIN 5 DAYS.    Alcohol Level No results found for: [facility]  IMAGING  CT Code Stroke CTA Head W/WO contrast CT Code Stroke CTA Neck W/WO contrast CT Code Stroke Cerebral Perfusion with contrast 03/12/2020 IMPRESSION:  1. Negative for large vessel occlusion, and no core infarct or ischemic penumbra detected by CT Perfusion.  2. Mild for age extracranial but fairly advanced intracranial atherosclerosis. Up to moderate circle-of-Willis branch irregularity and stenoses in the bilateral MCAs (M2 and M3), left PCA (P2), and distal ACAs  .   CT HEAD CODE STROKE WO CONTRAST 03/12/2020 IMPRESSION:  1. Hyperdense Left MCA branch in the left Sylvian  fissure suspicious for emergent large vessel occlusion.  2. No acute cortically based infarct or acute intracranial hemorrhage identified. ASPECTS 10.  3. Evidence of advanced underlying small vessel disease.  NM Pulmonary Perfusion 03/12/2020 IMPRESSION:  Normal V/Q scan.  No pulmonary embolus.   DG Swallowing Func-Speech Pathology 03/13/2020 Objective Swallowing Evaluation: Type of Study: MBS-Modified Barium Swallow Study  Patient Details Name: Trevor Anderson MRN: [id number] Date of Birth: 02/04/1956 Today's Date: 03/13/2020 Time: SLP Start Time (ACUTE ONLY): 1000 -SLP Stop Time (ACUTE ONLY): 1024 SLP Time Calculation (min) (ACUTE ONLY): 24 min Past Medical History: Past Medical History: Diagnosis Date . 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 CONGESTIVE HEART FAILURE 11/12/2007 . DIABETES MELLITUS, TYPE II 11/20/2008 . HYPERLIPIDEMIA 11/12/2007 . HYPERTENSION 11/12/2007 . TACHYCARDIA 01/24/2010 Past Surgical History: Past Surgical History: Procedure  Laterality Date . TONSILLECTOMY  [id number] HPI: 64 year old male with past medical history of diabetes mellitus type 2, coronary artery disease (cath 2009 with nonobstructive disease), systolic and diastolic congestive heart failure, hyperlipidemia, hypertension and medical noncompliance who presented to [facility] emergency department 11/18 with complaints of shortness of breath. Code stroke called am 11/19 due to acute right sided weakness and dysarthria.  W/u pending. Failed RN stroke swallow screen.  Subjective: alert Assessment / Plan / Recommendation CHL IP CLINICAL IMPRESSIONS 03/13/2020 Clinical Impression Pt presents with a primary oral dysphagia with right CN VII and XII weakness with senory and motor impairment leading to difficult lingual manipulation of solids and right sided anterior bolus loss and residue. Oral phase contributes to mild pharyngeal dysphagia with premature spillage and at times, delayed swallow initiation. WIth thin liquids there are instances of silent frank penetration with ejection and trace sensed aspiration with ejection. Pt is dyspneic and mildy impulsive with poor trunk control for adequate positioning and impaired self feeding. Over the weekend recommend a conservative diet of puree and nectar thick liquids, though given full airway protection expect pt to upgrade to thin liquids under supervision. Recommend CIR, will f/u for tolerance of diet.  SLP Visit Diagnosis Dysphagia, oropharyngeal phase (R13.12) Attention and concentration deficit following -- Frontal lobe and executive function deficit following -- Impact on safety and function Moderate aspiration risk   CHL IP TREATMENT RECOMMENDATION 03/12/2020 Treatment Recommendations Therapy as outlined in treatment plan below   Prognosis 03/13/2020 Prognosis for Safe Diet Advancement Good Barriers to Reach Goals -- Barriers/Prognosis Comment -- CHL IP DIET RECOMMENDATION 03/13/2020 SLP Diet Recommendations Dysphagia 2 (Fine  chop) solids;Nectar thick liquid Liquid Administration via Cup;Straw Medication Administration Whole meds with puree Compensations Minimize environmental distractions;Lingual sweep for clearance of pocketing;Monitor for anterior loss Postural Changes Seated upright at 90 degrees;Remain semi-upright after after feeds/meals (Comment)   CHL IP OTHER RECOMMENDATIONS 03/13/2020 Recommended Consults -- Oral Care Recommendations Oral care BID Other Recommendations --   CHL IP FOLLOW UP RECOMMENDATIONS 03/13/2020 Follow up Recommendations Inpatient Rehab   CHL IP FREQUENCY AND DURATION 03/13/2020 Speech Therapy Frequency (ACUTE ONLY) min 2x/week Treatment Duration 1 week      CHL IP ORAL PHASE 03/13/2020 Oral Phase Impaired Oral - Pudding Teaspoon -- Oral - Pudding Cup -- Oral - Honey Teaspoon -- Oral - Honey Cup -- Oral - Nectar Teaspoon -- Oral - Nectar Cup Lingual/palatal residue;Right pocketing in lateral sulci Oral - Nectar Straw Lingual/palatal residue;Right pocketing in lateral sulci;Other (Comment) Oral - Thin Teaspoon -- Oral - Thin Cup Right anterior bolus loss;Right pocketing in lateral sulci;Lingual/palatal residue;Decreased bolus cohesion;Premature spillage;Other (Comment) Oral - Thin Straw Right anterior bolus loss;Right  pocketing in lateral sulci;Lingual/palatal residue;Decreased bolus cohesion;Premature spillage;Other (Comment) Oral - Puree Right pocketing in lateral sulci;Lingual/palatal residue Oral - Mech Soft Decreased bolus cohesion;Lingual/palatal residue;Reduced posterior propulsion;Weak lingual manipulation;Impaired mastication Oral - Regular -- Oral - Multi-Consistency -- Oral - Pill WFL Oral Phase - Comment --  CHL IP PHARYNGEAL PHASE 03/13/2020 Pharyngeal Phase Impaired Pharyngeal- Pudding Teaspoon -- Pharyngeal -- Pharyngeal- Pudding Cup -- Pharyngeal -- Pharyngeal- Honey Teaspoon -- Pharyngeal -- Pharyngeal- Honey Cup -- Pharyngeal -- Pharyngeal- Nectar Teaspoon -- Pharyngeal -- Pharyngeal-  Nectar Cup Delayed swallow initiation-pyriform sinuses;Delayed swallow initiation-vallecula Pharyngeal -- Pharyngeal- Nectar Straw Delayed swallow initiation-vallecula;Delayed swallow initiation-pyriform sinuses Pharyngeal -- Pharyngeal- Thin Teaspoon -- Pharyngeal -- Pharyngeal- Thin Cup Delayed swallow initiation-pyriform sinuses;Penetration/Aspiration before swallow Pharyngeal Material enters airway, CONTACTS cords and then ejected out;Material enters airway, passes BELOW cords then ejected out;Material does not enter airway Pharyngeal- Thin Straw Delayed swallow initiation-pyriform sinuses;Penetration/Aspiration before swallow Pharyngeal Material enters airway, CONTACTS cords and then ejected out;Material enters airway, passes BELOW cords then ejected out;Material does not enter airway Pharyngeal- Puree Delayed swallow initiation-vallecula Pharyngeal -- Pharyngeal- Mechanical Soft Delayed swallow initiation-vallecula Pharyngeal -- Pharyngeal- Regular -- Pharyngeal -- Pharyngeal- Multi-consistency -- Pharyngeal -- Pharyngeal- Pill Delayed swallow initiation-vallecula Pharyngeal -- Pharyngeal Comment --  No flowsheet data found. Harlon Ditty, MA CCC-SLP Acute Rehabilitation Services Pager [phone] Office [phone] Claudine Mouton 03/13/2020, 10:43 AM              ECHOCARDIOGRAM COMPLETE 03/12/2020 IMPRESSIONS   1. Left ventricular ejection fraction, by estimation, is 45 to 50%. The left ventricle has mildly decreased function. The left ventricle demonstrates global hypokinesis. There is mild concentric left ventricular hypertrophy. Left ventricular diastolic parameters are consistent with Grade I diastolic dysfunction (impaired relaxation).   2. Right ventricular systolic function is normal. The right ventricular size is normal. Tricuspid regurgitation signal is inadequate for assessing PA pressure.   3. The mitral valve is grossly normal. Trivial mitral valve regurgitation. No evidence of  mitral stenosis.   4. The aortic valve is tricuspid. Aortic valve regurgitation is not visualized. No aortic stenosis is present.   5. The inferior vena cava is normal in size with greater than 50% respiratory variability, suggesting right atrial pressure of 3 mmHg. Comparison(s): Changes from prior study are noted. EF mildly reduced 45-50% with global HK.   ECG - ST rate 125 BPM. (See cardiology reading for complete details)  PHYSICAL EXAM Blood pressure (!) 133/107, pulse 88, temperature 98.4 F (36.9 C), temperature source Oral, resp. rate 16, height 5\' 11"  (1.803 m), weight 110 kg, SpO2 92 %. GENERAL: Obese middle-aged African-American male not in distress HEENT: Supple no trauma noted  ABDOMEN: soft  EXTREMITIES: No edema   BACK: Normal  SKIN: Normal by inspection.    MENTAL STATUS: He is awake and responsive and cooperates with evaluation.  He has moderate to severe dysarthria.  No clear aphasia noted.  CRANIAL NERVES: Pupils are equal, round and reactive to light and accomodation; extra ocular movements are full, there is no significant nystagmus; visual fields are full;  there is plegia of the right lower facial muscles; tongue deviates to the right; uvula is midline  MOTOR: Flaccid right hemiplegia.  0/5 strength.  Left side shows normal tone, bulk and strength.  COORDINATION: No dysmetria, tremor or parkinsonism noted.  No myoclonus.  SENSATION: Normal to light touch, temperature, and pain.    NIH stroke scale 12. Premorbid modified Rankin scale 0  ASSESSMENT/PLAN Trevor Anderson is a 64 y.o. male with history of obesity, DM2, CAD, systolic and diastolic CHF, HLD, HTN and medical noncompliance who presented to the [facility] ED 11/17 with a 2 week history of orthopnea, SOB, increased abdominal girth, and a mild nonproductive cough. On the morning of 11/18 the RN noted that the patient seemed to have significant right sided weakness. He was later found to have  a prominent right facial droop and dysarthria.  NIHSS was 10. Code Stroke was called. He did not receive IV t-PA due to being out of the therapeutic window for treatment.  Possible TIA:   Resultant moderate to severe dysarthria, right hemiplegia, right lower facial weakness and tongue deviation to the right.  Code Stroke CT Head - Hyperdense Left MCA branch in the left Sylvian fissure suspicious for emergent large vessel occlusion. No acute cortically based infarct or acute intracranial hemorrhage identified. ASPECTS 10. Evidence of advanced underlying small vessel disease.  CT head - not ordered  MRI head -moderate large left basal ganglia infarct as well as small right subcortical and left cerebellar white matter infarcts  MRA head - not ordered   CTA H&N - Mild for age extracranial but fairly advanced intracranial atherosclerosis. Up to moderate circle-of-Willis branch irregularity and stenoses in the bilateral MCAs (M2 and M3), left PCA (P2), and distal ACAs   CT Perfusion - no core infarct or ischemic penumbra detected by CT Perfusion.   Carotid Doppler - CTA neck ordered - carotid dopplers not indicated.  2D Echo - EF 45 - 50%. No cardiac source of emboli identified.   Sars Corona Virus 2 - negative  LDL - 173  HgbA1c - 6.8  UDS - not ordered  VTE prophylaxis - none Diet  Diet Order            Diet NPO time specified Except for: Sips with Meds  Diet effective midnight           DIET DYS 2 Room service appropriate? Yes with Assist; Fluid consistency: Nectar Thick  Diet effective now                 aspirin 81 mg daily prior to admission, now on aspirin 81 mg daily and clopidogrel 75 mg daily  Patient counseled to be compliant with his antithrombotic medications  Ongoing aggressive stroke risk factor management  Therapy recommendations:  Skilled Nursing Facility placement recommended by therapists  Disposition:  Pending  Hypertension  Home BP meds: Coreg ;  Norvasc ; Lisinopril ; hydralazine  Current BP meds: Coreg  Stable . Permissive hypertension (OK if < 220/120) but gradually normalize in 5-7 days  . Long-term BP goal normotensive  Hyperlipidemia  Home Lipid lowering medication: Zocor 40 mg daily  LDL 173, goal < 70  Current lipid lowering medication: Zocor 40 mg daily  Continue statin at discharge  Diabetes  Home diabetic meds: insulin ; metformin  Current diabetic meds: SSI   HgbA1c 6.8, goal < 7.0 Recent Labs    03/14/20 1208 03/15/20 0633 03/15/20 1141  GLUCAP 167* 130* 154*    Other Stroke Risk Factors  Advanced age  ETOH use, advised to drink no more than 1 alcoholic beverage per day.  Obesity, Body mass index is 33.82 kg/m., recommend weight loss, diet and exercise as appropriate   Hx of medical non compliance  Coronary artery disease  Congestive Heart Failure  Other Active Problems  Code status - Full code  Hypokalemia - potassium -  3.2 - supplemented  Acute vs CKD - creatinine - 2.16   Hospital day # 3  Patient has by cerebral infarcts though mostly subcortical the left basal ganglia infarct appears large enough to worry about cryptogenic's stroke.  Recommend checking transesophageal echocardiogram for cardiac source of embolism and PFO and if negative consider loop recorder for long-term monitoring for paroxysmal A. fib.  Continue ongoing therapies, mobilize out of bed.  Patient appears to be at risk for sleep apnea and may consider participation in the sleep smart stroke prevention study if interested and will be given information to review and decide.  Discussed with patient and his son and Dr. Lucianne Muss.  Greater than 50% time during this 35-minute visit was spent on counseling and coordination of care about his bicerebral strokes and discussion about evaluation and treatment plan and answering questions. Delia Heady, MD  To contact Stroke Continuity provider, please refer to [email or web address]. After  hours, contact General Neurology

## 2020-03-15 NOTE — Progress Notes (Signed)
Inpatient Rehab Admissions Coordinator Note:   Per PT/ST recommendations, pt was screened for CIR candidacy by Wolfgang Phoenix, MS, CCC-SLP.  At this time we are recommending an inpatient rehab consult. AC will place consult order per protocol.  Please contact me with questions.    Wolfgang Phoenix, MS, CCC-SLP Admissions Coordinator [phone] 03/15/20 4:06 PM

## 2020-03-15 NOTE — Progress Notes (Addendum)
    [facility] has been requested to perform a transesophageal echocardiogram on this patient for stroke.  After careful review of history and examination, the risks and benefits of transesophageal echocardiogram have been explained including risks of esophageal damage, perforation (1:10,000 risk), bleeding, pharyngeal hematoma as well as other potential complications associated with monitored anesthesia care including aspiration, arrhythmia, respiratory failure and death. Alternatives to treatment were discussed, questions were answered. Patient is willing to proceed. Of note, patient is still struggling with slurred speech/dysarthria after his stroke but is alert/oriented and answered questions appropriately with shortened answers. His brother was also at bedside to receive this information. He is scheduled for 1pm tomorrow with Dr. Cristal Deer. Orders written including to be NPO after midnight except sips with meds.   Laurann Montana, PA-C 03/15/2020 3:49 PM

## 2020-03-15 NOTE — Evaluation (Signed)
Speech Language Pathology Evaluation Patient Details Name: Trevor Anderson MRN: [id number] DOB: 05/25/1955 Today's Date: 03/15/2020 Time: 1884-1660 SLP Time Calculation (min) (ACUTE ONLY): 21 min  Problem List:  Patient Active Problem List   Diagnosis Date Noted  . 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  . Hypertensive urgency 03/12/2020  . Acute on chronic systolic CHF (congestive heart failure) (HCC) 03/12/2020  . AKI (acute kidney injury) (HCC) 03/12/2020  . Fall at home, initial encounter 03/12/2020  . Right hip pain 03/12/2020  . Hypokalemia 03/12/2020  . Coronary artery disease involving native coronary artery of native heart without angina pectoris 09/07/2014  . Renal insufficiency 12/02/2012  . Preventative health care 11/13/2010  . 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 TACHYCARDIA 01/24/2010  . Type 2 diabetes mellitus without complication, with long-term current use of insulin (HCC) 11/20/2008  . Mixed hyperlipidemia 11/12/2007  . Essential hypertension 11/12/2007  . CONGESTIVE HEART FAILURE 11/12/2007   Past Medical History:  Past Medical History:  Diagnosis Date  . 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 CONGESTIVE HEART FAILURE 11/12/2007  . DIABETES MELLITUS, TYPE II 11/20/2008  . HYPERLIPIDEMIA 11/12/2007  . HYPERTENSION 11/12/2007  . TACHYCARDIA 01/24/2010   Past Surgical History:  Past Surgical History:  Procedure Laterality Date  . TONSILLECTOMY  [id number]   HPI:  64 year old male with past medical history of diabetes mellitus type 2, coronary artery disease (cath 2009 with nonobstructive disease), systolic and diastolic congestive heart failure, hyperlipidemia, hypertension and medical noncompliance who presented to [facility] emergency department 11/18 with complaints of shortness of breath. Code stroke called am 11/19 due to acute right sided weakness and dysarthria.  Failed RN stroke swallow screen. MRI brain:11/22: Moderate size  acute infarction involving the left basal ganglia and adjacent white matter. Additional small acute infarcts involving the right corona radiata and basal ganglia and left paramedian cerebellum.   Assessment / Plan / Recommendation Clinical Impression  Pt was seen for speech/language evaluation with his brother present. Both parties denied the pt having any baseline deficits in these areas but pt reported that he now "can't get [his] words out". Pt was dyspneic throughout the evaluation and dyspnea worsened with continued attempts to speak. His language skills appeared functional; however, he exhibited difficulty with completion of 3-step commands. Complete assessment of the pt's cognitive-linguistic skills was deferred due to pt's progressive dyspnea and report of difficulty speaking. However, he demonstrated difficulty with attention which may have negatively impacted his auditory comprehension skills. Pt exhibited moderate to severe dysarthria characterized by reduced articulatory precision, an intermittently rough vocal quality, and an impaired ability to adequately coordinate respiration with speech which resulted in reduced vocal intensity. Repetition was frequently requested; but speech intelligibility was still significantly reduced at the word and phrase levels. Periods of aphonia were noted during attempts at speech production due to dyspnea which impaired pt's ability to adequately coordinate respiration with speech. Skilled SLP services are clinically indicated at this time to improve motor speech skills and further assess cognitive-linguistic skills.     SLP Assessment  SLP Recommendation/Assessment: Patient needs continued Speech Lanaguage Pathology Services SLP Visit Diagnosis: Dysarthria and anarthria (R47.1)    Follow Up Recommendations  Inpatient Rehab    Frequency and Duration min 2x/week  2 weeks      SLP Evaluation Cognition  Overall Cognitive Status: Difficult to assess (due  to severity of dysarthria ) Arousal/Alertness: Awake/alert Orientation Level: Oriented X4 Attention: Sustained;Focused Focused Attention: Impaired Focused Attention Impairment: Verbal complex Sustained Attention: Impaired  Sustained Attention Impairment: Verbal complex       Comprehension  Auditory Comprehension Overall Auditory Comprehension: Appears within functional limits for tasks assessed Yes/No Questions: Impaired Basic Immediate Environment Questions:  (3/56) Complex Questions:  (5/5) Paragraph Comprehension (via yes/no questions):  (2/4) Commands: Impaired Two Step Basic Commands:  (4/4) Multistep Basic Commands:  (1/3) Conversation: Simple    Expression Expression Primary Mode of Expression: Verbal Verbal Expression Overall Verbal Expression: Appears within functional limits for tasks assessed Initiation: No impairment Automatic Speech: Counting;Month of year;Day of week (WNL) Level of Generative/Spontaneous Verbalization: Sentence;Conversation Repetition: No impairment Naming: No impairment Pragmatics: Impairment Impairments: Abnormal affect Interfering Components: Attention   Oral / Motor  Oral Motor/Sensory Function Overall Oral Motor/Sensory Function: Moderate impairment Facial ROM: Reduced right Facial Symmetry: Abnormal symmetry right;Suspected CN VII (facial) dysfunction Facial Strength: Reduced right Lingual ROM: Reduced right Lingual Symmetry: Abnormal symmetry right;Suspected CN XII (hypoglossal) dysfunction Lingual Strength: Reduced;Suspected CN XII (hypoglossal) dysfunction Motor Speech Overall Motor Speech: Impaired Respiration: Impaired Level of Impairment: Word Phonation: Breathy;Low vocal intensity Articulation: Impaired Level of Impairment: Phrase Intelligibility: Intelligibility reduced Word: 25-49% accurate Phrase: 0-24% accurate Sentence: 0-24% accurate Conversation: Not tested Motor Planning: Witnin functional limits Motor Speech  Errors: Aware;Consistent   Paighton Godette I. Vear Clock, MS, CCC-SLP Acute Rehabilitation Services Office number [phone] Pager [phone]                    Scheryl Marten 03/15/2020, 11:45 AM

## 2020-03-15 NOTE — Plan of Care (Signed)

## 2020-03-15 NOTE — Progress Notes (Signed)
Mobility Specialist: Progress Note   03/15/20 1504  Mobility  Activity  (ROM Exercises)  Level of Assistance Maximum assist, patient does 25-49%  Assistive Device None  Mobility Response Tolerated fair  Mobility performed by Mobility specialist  $Mobility charge 1 Mobility   Pre-Mobility: 87 HR, 98% SpO2 Post-Mobility: 87 HR, BP, 97% SpO2  Assisted pt to EOB to perform ROM exercises. Both the RUE and RLE remain flaccid. Pt back to bed with bed alarm on and call bell at his L side.   [facility] [name] Mobility Specialist

## 2020-03-15 NOTE — Progress Notes (Signed)
Physical Therapy Treatment Patient Details Name: Trevor BROUGHTON MRN: [id number] DOB: Jan 17, 1956 Today's Date: 03/15/2020    History of Present Illness HPI: 64 year old male with past medical history of diabetes mellitus type 2, coronary artery disease (cath 2009 with nonobstructive disease), systolic and diastolic congestive heart failure, hyperlipidemia, hypertension and medical noncompliance who presented to [facility] emergency department 11/18 with complaints of shortness of breath. Code stroke called am 11/19 due to acute right sided weakness and dysarthria.    PT Comments    Pt supine in bed on arrival and soiled from faulty condom cath.  Pt performed mobilization to edge of bed and into standing in sara stedy frame.  He remains flaccid on R side so required heavy assistance on R side.  Pt tolerated session well but with noticeable fatigue.  Pt continues to benefit from aggressive rehab in a post acute setting to maximize functional gains before return home.     Follow Up Recommendations  CIR;Supervision/Assistance - 24 hour     Equipment Recommendations  Wheelchair (measurements PT);Wheelchair cushion (measurements PT);3in1 (PT);Hospital bed    Recommendations for Other Services OT consult;Rehab consult     Precautions / Restrictions Precautions Precautions: Fall Restrictions Weight Bearing Restrictions: No Other Position/Activity Restrictions: R sided hemiparesis.  Thickened liquids and puree diet.    Mobility  Bed Mobility Overal bed mobility: Needs Assistance Bed Mobility: Rolling Rolling: Max assist   Supine to sit: Mod assist;+2 for physical assistance     General bed mobility comments: Pt able to move L side but required heavy assistance for R side.  Pt presents with R side posterior lean in sitting edge of bed.  Sitting balance improved with use of sara stedy in front as he was able to hold to cross bar.  Transfers Overall transfer level: Needs  assistance Equipment used: Ambulation equipment used (sara stedy) Transfers: Sit to/from Stand Sit to Stand: Max assist;+2 safety/equipment;From elevated surface         General transfer comment: Pt able to follow commands to utilize L side of his body to come into standing.  Stedy blocked R knee and PTA facilitated trunk control and weight bearing to R shoulder and head control.  Emphasis on finding midline.  Ambulation/Gait                 Stairs             Wheelchair Mobility    Modified Rankin (Stroke Patients Only)       Balance Overall balance assessment: Needs assistance Sitting-balance support: Single extremity supported;Feet supported Sitting balance-Leahy Scale: Poor   Postural control: Posterior lean;Right lateral lean;Other (comment)   Standing balance-Leahy Scale: Zero                 High Level Balance Comments: Pt performed sitting on higher stedy plates with R UE and trunk supported, focusing on trunk control and finding midline while seated in front of the mirror.  15 min total.            Cognition Arousal/Alertness: Awake/alert Behavior During Therapy: WFL for tasks assessed/performed Overall Cognitive Status: Difficult to assess (due to severity of dysarthria) Area of Impairment: Following commands;Safety/judgement                       Following Commands: Follows one step commands inconsistently;Follows one step commands with increased time Safety/Judgement: Decreased awareness of safety;Decreased awareness of deficits     General Comments: Pt  fatigues quickly but willing to work.  Required increased time and redirection to follow commands.      Exercises      General Comments        Pertinent Vitals/Pain Pain Assessment: Faces Faces Pain Scale: Hurts even more Pain Location: R hip Pain Descriptors / Indicators: Sharp Pain Intervention(s): Monitored during session;Repositioned    Home Living      Available Help at Discharge: Family;Available PRN/intermittently Type of Home: House              Prior Function            PT Goals (current goals can now be found in the care plan section) Acute Rehab PT Goals Patient Stated Goal: Patient is unsure what comes next. Potential to Achieve Goals: Fair Progress towards PT goals: Progressing toward goals    Frequency    Min 4X/week      PT Plan Current plan remains appropriate    Co-evaluation              AM-PAC PT "6 Clicks" Mobility   Outcome Measure  Help needed turning from your back to your side while in a flat bed without using bedrails?: A Lot Help needed moving from lying on your back to sitting on the side of a flat bed without using bedrails?: A Lot Help needed moving to and from a bed to a chair (including a wheelchair)?: Total Help needed standing up from a chair using your arms (e.g., wheelchair or bedside chair)?: Total Help needed to walk in hospital room?: Total Help needed climbing 3-5 steps with a railing? : Total 6 Click Score: 8    End of Session Equipment Utilized During Treatment: Gait belt Activity Tolerance: Patient tolerated treatment well Patient left: in bed;with bed alarm set;with call bell/phone within reach;with family/visitor present (in chair position.) Nurse Communication: Mobility status PT Visit Diagnosis: Unsteadiness on feet (R26.81);Other abnormalities of gait and mobility (R26.89);Muscle weakness (generalized) (M62.81);Hemiplegia and hemiparesis;Pain Hemiplegia - Right/Left: Right Hemiplegia - dominant/non-dominant: Dominant Hemiplegia - caused by: Cerebral infarction Pain - Right/Left: Right Pain - part of body: Hip     Time: 4540-9811 PT Time Calculation (min) (ACUTE ONLY): 48 min  Charges:  $Therapeutic Activity: 38-52 mins                     Bonney Leitz , PTA Acute Rehabilitation Services Pager [phone] Office [phone]     Dontel Harshberger Artis Delay 03/15/2020, 12:47 PM

## 2020-03-15 NOTE — Progress Notes (Signed)
PROGRESS NOTE    Trevor Anderson  QDU:[id number] DOB: [birth year] DOA: 03/11/2020 PCP: [name], MD   Brief Narrative:  This 64 years old male with PMH of DM II, CAD (cath 2009 nonobstructive disease), systolic and diastolic CHF (EF 37%), hyperlipidemia, hypertension and medical noncompliance presents in the emergency department with complaints of acute shortness of breath. He reports shortness of breath is progressively getting worse. Patient also reports fall from his chair while watching TV resulting in right hip pain,  worse with weightbearing. Patient was found to be tachycardic and tachypneic, after an evaluation. Patient was started on intravenous heparin with a concern for possible PE. VQ scan is ordered to rule out PE due to worsening renal functions. Patient was admitted for acute on chronic CHF exacerbation, suspected PE, hypertensive urgency, right hip pain. VQ scan negative, heparin discontinued. Patient was found to have right-sided weakness, slurring of his speech and  facial droop during morning round. Code stroke was called. Patient was not deemed a candidate for TPA. Exact time of symptoms unknown. CT head without abnormality in brain parenchyma. Patient was seen by neurology recommended MRI of the brain, PT/ OT consult speech consult.  Echocardiogram. PT recommended CIR   Assessment & Plan:   Principal Problem:   Acute on chronic systolic CHF (congestive heart failure) (HCC) Active Problems:   Type 2 diabetes mellitus without complication, with long-term current use of insulin (HCC)   Mixed hyperlipidemia   Coronary artery disease involving native coronary artery of native heart without angina pectoris   Hypertensive urgency   AKI (acute kidney injury) (HCC)   Fall at home, initial encounter   Right hip pain   Hypokalemia   Cerebral thrombosis with cerebral infarction  Right-sided weakness secondary to  the stroke. Patient is found to have right-sided weakness in  the morning round. Code Stroke called,  neurology evaluated the patient  CTA Head/ Neck : Negative for large vessel occlusion, and no core infarct or ischemic penumbra detected by CT Perfusion. Advised stroke pathway. MRI: Moderate size acute infarction involving the left basal ganglia and adjacent white matter. Neurology recommended dual antiplatelet therapy for now (aspirin and Plavix) Speech and swallow evaluation completed recommended thick liquids. Neurology recommended TEE to rule out embolic source. PT evaluation completed recommended CIR / SNF Neurology will follow.   Acute on chronic systolic CHF (congestive heart failure) (HCC)   Patient presented with 2-week history of paroxysmal nocturnal dyspnea and increasing abdominal girth with 1 week history of progressively worsening shortness of breath.  BNP of 705.    Patient has been noncompliant with his antihypertensives likely precipitating acute flare.  Patient was started on Lasix 40 mg IV twice daily.  Strict input and output monitoring  Monitoring renal function and  electrolytes  Monitoring patient on telemetry  Echocardiogram LVEF 45 to 50% , left ventricle has decreased function.  Global hypokinesis       V Q scan negative, heparin discontinued.     Hypertensive urgency >>> Improving   Patient presented with hypertensive urgency with blood pressures as high as 213/151.    Patient admits to "not always taking his blood pressure medications".  Per review of our records patient has not seen Dr. Jonny Ruiz ( PCP)  since January 2019.  Providing patient with as needed intravenous antihypertensives for systolic blood pressures above 200 at this time.  Blood pressure improved.  Placing patient in progressive unit for close monitoring.    AKI (acute kidney injury) (  HCC)  Patient presenting with creatinine of 2.11, an increase from 1.19 2 years ago  Because of patient's lack of outpatient follow-up it is unclear  as to whether or not this elevated creatinine is due to acute kidney injury or progression of chronic kidney disease in the setting of uncontrolled blood pressure.  We will manage blood pressure slowly with IV and po Meds  Strict input and output monitoring  Monitoring renal function and electrolytes with serial chemistries  Renal ultrasound unremarkable.    Fall at home, initial encounter  Patient reports suffering a fall at home earlier in the day on 11/18.  Patient complains of right hip pain .  X-ray of the right hip reveals a possible femur fracture -however upon discussion of these images between the emergency department provider and Dr. Cyndia Bent orthopedic surgery disagrees with this read and recommends more advanced imaging.    CT hip shows unremarkable,  no fracture noted.     Type 2 diabetes mellitus without complication, with long-term current use of insulin (HCC)   Accu-Cheks before every meal and nightly with signs scale insulin  Hemoglobin A1c : 6.8  Hold Metformin    Coronary artery disease involving native coronary artery of native heart without angina pectoris   Continue aspirin and beta-blocker, Plavix was added due to stroke.  Patient is chest pain-free  Monitoring patient on telemetry   EKG reveals no evidence of dynamic ST segment changes  Troponin trended down could be due to demand ischemia    Mixed hyperlipidemia    Question whether patient is actually taking statin at this time.    Patient states that he inconsistently takes any medications.  Continue Lipitor 40 mg   Obstructive sleep apnea Patient does have brief periods of apnea during sleep when his respiratory rate goes down to 8. Patient agrees to get enrolled in a  Research study. If sleep study is positive,  he will get CPAP trial. He has 50% chance of getting CPAP supplies once approved.    DVT prophylaxis: SCDs Code Status: Full Family Communication: spoke with son at bed  side. Disposition Plan:  Status is: Inpatient  Remains inpatient appropriate because:Inpatient level of care appropriate due to severity of illness   Dispo: The patient is from: Home              Anticipated d/c is to: SNF              Anticipated d/c date is: > 3 days              Patient currently is not medically stable to d/c.   Consultants:   Neurology  Procedures:  Antimicrobials:  Anti-infectives (From admission, onward)   None      Subjective: Patient was seen and examined at bedside.  Overnight events noted.  Patient blood pressure has improved.  He appears better, he has significant weakness on the right side. He has had applesauce twice.  Still has significant slurring of speech. Objective: Vitals:   03/15/20 0347 03/15/20 0634 03/15/20 0915 03/15/20 1144  BP: (!) 160/103  (!) 124/106 (!) 133/107  Pulse: 87 86 87 88  Resp: (!) Temp: 97.9 F (36.6 C)  97.8 F (36.6 C) 98.4 F (36.9 C)  TempSrc: Oral  Oral Oral  SpO2: 100% 97% 98% 92%  Weight:  110 kg    Height:        Intake/Output Summary (Last 24 hours) at 03/15/2020 1354  Last data filed at 03/15/2020 1145 Gross per 24 hour  Intake 200 ml  Output 650 ml  Net -450 ml   Filed Weights   03/13/20 0419 03/14/20 0422 03/15/20 0634  Weight: 111.6 kg 110.8 kg 110 kg    Examination:  General exam: Appears calm and comfortable , facial droop, not in distress. Respiratory system: Clear to auscultation. Respiratory effort normal. Cardiovascular system: S1 & S2 heard, RRR. No JVD, murmurs, rubs, gallops or clicks. No pedal edema. Gastrointestinal system: Abdomen is nondistended, soft and nontender. No organomegaly or masses felt.  Normal bowel sounds heard. Central nervous system: Alert and oriented.  Facial droop, slurring of speech Extremities: Right-sided weakness, RUE 0/5 , RLE 1/5 Skin: No rashes, lesions or ulcers Psychiatry: Judgement and insight appear normal. Mood & affect  appropriate.     Data Reviewed: I have personally reviewed following labs and imaging studies  CBC: Recent Labs  Lab 03/11/20 1854 03/12/20 0217 03/13/20 0129 03/14/20 0217 03/15/20 0117  WBC 10.9* 12.4* 10.9* 8.8 7.5  HGB 12.6* 13.0 13.5 13.6 13.9  HCT 40.7 40.9 41.9 42.3 43.5  MCV 98.8 96.0 96.8 96.4 95.4  PLT 276 295 280 297 280   Basic Metabolic Panel: Recent Labs  Lab 03/11/20 1854 03/12/20 0217 03/13/20 0129 03/14/20 0217 03/15/20 0117  NA 141  --  140 140 142  K 3.2*  --  3.4* 3.2* 3.2*  CL 107  --  103 105 106  CO2 18*  --  23 24 25   GLUCOSE 146*  --  108* 123* 98  BUN 23  --  31* 35* 36*  CREATININE 2.11*  --  2.38* 2.16* 2.09*  CALCIUM 8.9  --  8.5* 8.4* 8.5*  MG  --  1.8 2.0 2.1  --   PHOS  --   --  5.4* 4.6  --    GFR: Estimated Creatinine Clearance: 46.2 mL/min (A) (by C-G formula based on SCr of 2.09 mg/dL (H)). Liver Function Tests: No results for input(s): AST, ALT, ALKPHOS, BILITOT, PROT, ALBUMIN in the last 168 hours. No results for input(s): LIPASE, AMYLASE in the last 168 hours. No results for input(s): AMMONIA in the last 168 hours. Coagulation Profile: No results for input(s): INR, PROTIME in the last 168 hours. Cardiac Enzymes: No results for input(s): CKTOTAL, CKMB, CKMBINDEX, TROPONINI in the last 168 hours. BNP (last 3 results) No results for input(s): PROBNP in the last 8760 hours. HbA1C: No results for input(s): HGBA1C in the last 72 hours. CBG: Recent Labs  Lab 03/13/20 2126 03/14/20 0613 03/14/20 1208 03/15/20 0633 03/15/20 1141  GLUCAP 128* 112* 167* 130* 154*   Lipid Profile: No results for input(s): CHOL, HDL, LDLCALC, TRIG, CHOLHDL, LDLDIRECT in the last 72 hours. Thyroid Function Tests: No results for input(s): TSH, T4TOTAL, FREET4, T3FREE, THYROIDAB in the last 72 hours. Anemia Panel: No results for input(s): VITAMINB12, FOLATE, FERRITIN, TIBC, IRON, RETICCTPCT in the last 72 hours. Sepsis Labs: No results for  input(s): PROCALCITON, LATICACIDVEN in the last 168 hours.  Recent Results (from the past 240 hour(s))  Respiratory Panel by RT PCR (Flu A&B, Covid) - Nasopharyngeal Swab     Status: None   Collection Time: 03/11/20  6:15 PM   Specimen: Nasopharyngeal Swab; Nasopharyngeal(NP) swabs in vial transport medium  Result Value Ref Range Status   SARS Coronavirus 2 by RT PCR NEGATIVE NEGATIVE Final    Comment: (NOTE) SARS-CoV-2 target nucleic acids are NOT DETECTED.  The SARS-CoV-2 RNA is generally  detectable in upper respiratoy specimens during the acute phase of infection. The lowest concentration of SARS-CoV-2 viral copies this assay can detect is 131 copies/mL. A negative result does not preclude SARS-Cov-2 infection and should not be used as the sole basis for treatment or other patient management decisions. A negative result may occur with  improper specimen collection/handling, submission of specimen other than nasopharyngeal swab, presence of viral mutation(s) within the areas targeted by this assay, and inadequate number of viral copies (<131 copies/mL). A negative result must be combined with clinical observations, patient history, and epidemiological information. The expected result is Negative.  Fact Sheet for Patients:  https://www.moore.com/  Fact Sheet for Healthcare Providers:  https://www.young.biz/  This test is no t yet approved or cleared by the [place] FDA and  has been authorized for detection and/or diagnosis of SARS-CoV-2 by FDA under an Emergency Use Authorization (EUA). This EUA will remain  in effect (meaning this test can be used) for the duration of the COVID-19 declaration under Section 564(b)(1) of the Act, 21 U.S.C. section 360bbb-3(b)(1), unless the authorization is terminated or revoked sooner.     Influenza A by PCR NEGATIVE NEGATIVE Final   Influenza B by PCR NEGATIVE NEGATIVE Final    Comment:  (NOTE) The Xpert Xpress SARS-CoV-2/FLU/RSV assay is intended as an aid in  the diagnosis of influenza from Nasopharyngeal swab specimens and  should not be used as a sole basis for treatment. Nasal washings and  aspirates are unacceptable for Xpert Xpress SARS-CoV-2/FLU/RSV  testing.  Fact Sheet for Patients: https://www.moore.com/  Fact Sheet for Healthcare Providers: https://www.young.biz/  This test is not yet approved or cleared by the [place] FDA and  has been authorized for detection and/or diagnosis of SARS-CoV-2 by  FDA under an Emergency Use Authorization (EUA). This EUA will remain  in effect (meaning this test can be used) for the duration of the  Covid-19 declaration under Section 564(b)(1) of the Act, 21  U.S.C. section 360bbb-3(b)(1), unless the authorization is  terminated or revoked. Performed at [facility], [address] [place], [place] [address]          Radiology Studies: MR BRAIN WO CONTRAST  Result Date: 03/14/2020 CLINICAL DATA:  Code stroke follow-up, abnormal CT EXAM: MRI HEAD WITHOUT CONTRAST TECHNIQUE: Multiplanar, multiecho pulse sequences of the brain and surrounding structures were obtained without intravenous contrast. COMPARISON:  Correlation made with prior CT imaging FINDINGS: Motion artifact is present. Brain: There is restricted diffusion involving the left caudate body, corona radiata, and posterior left lentiform nucleus. There are 3 small foci of restricted diffusion involving the right corona radiata and lentiform nucleus. Small focus of restricted diffusion in the left parasagittal cerebellum. There are two punctate foci of susceptibility in the right corona radiata and a third punctate focus in the medial left temporal lobe likely reflecting chronic microhemorrhages. Additional patchy and confluent areas of T2 hyperintensity in the supratentorial white matter are nonspecific but probably  reflect moderate chronic microvascular ischemic changes. There is no intracranial mass or significant mass effect. No hydrocephalus. Vascular: Major vessel flow voids at the skull base are preserved. Skull and upper cervical spine: Normal marrow signal is preserved. Sinuses/Orbits: Paranasal sinuses are aerated. Orbits are unremarkable. Other: Sella is partially empty.  Mastoid air cells are clear. IMPRESSION: Moderate size acute infarction involving the left basal ganglia and adjacent white matter. Additional small acute infarcts involving the right corona radiata and basal ganglia and left paramedian cerebellum. Moderate chronic  microvascular ischemic changes. Electronically Signed   By: Guadlupe Spanish M.D.   On: 03/14/2020 14:48     Scheduled Meds: . aspirin EC  81 mg Oral Daily  . carvedilol  25 mg Oral BID WC  . clopidogrel  75 mg Oral Daily  . furosemide  40 mg Intravenous BID  . insulin aspart  0-15 Units Subcutaneous TID AC & HS  . simvastatin  40 mg Oral Daily  . sodium chloride flush  3 mL Intravenous Q12H   Continuous Infusions: . sodium chloride       LOS: 3 days    Time spent: 25 mins    Dajiah Kooi, MD [facility]   If 7PM-7AM, please contact night-coverage

## 2020-03-16 ENCOUNTER — Encounter (HOSPITAL_COMMUNITY)
Admission: EM | Disposition: A | Discharge status: Home / Self Care | Payer: Self-pay | Source: Home / Self Care | Attending: Internal Medicine

## 2020-03-16 ENCOUNTER — Inpatient Hospital Stay (HOSPITAL_COMMUNITY): Payer: No Typology Code available for payment source

## 2020-03-16 ENCOUNTER — Encounter (HOSPITAL_COMMUNITY): Payer: Self-pay | Admitting: Family Medicine

## 2020-03-16 ENCOUNTER — Inpatient Hospital Stay (HOSPITAL_COMMUNITY): Payer: No Typology Code available for payment source | Admitting: Anesthesiology

## 2020-03-16 DIAGNOSIS — I639 Cerebral infarction, unspecified: Secondary | ICD-10-CM

## 2020-03-16 DIAGNOSIS — I340 Nonrheumatic mitral (valve) insufficiency: Secondary | ICD-10-CM | POA: Diagnosis not present

## 2020-03-16 HISTORY — PX: BUBBLE STUDY: SHX6837

## 2020-03-16 HISTORY — PX: TEE WITHOUT CARDIOVERSION: SHX5443

## 2020-03-16 LAB — GLUCOSE, CAPILLARY: Glucose-Capillary: 124 mg/dL — ABNORMAL HIGH (ref 70–99)

## 2020-03-16 LAB — COMPREHENSIVE METABOLIC PANEL
ALT: 22 U/L (ref 0–44)
AST: 30 U/L (ref 15–41)
Albumin: 3 g/dL — ABNORMAL LOW (ref 3.5–5.0)
Alkaline Phosphatase: 55 U/L (ref 38–126)
Anion gap: 14 (ref 5–15)
BUN: 38 mg/dL — ABNORMAL HIGH (ref 8–23)
CO2: 21 mmol/L — ABNORMAL LOW (ref 22–32)
Calcium: 8.5 mg/dL — ABNORMAL LOW (ref 8.9–10.3)
Chloride: 106 mmol/L (ref 98–111)
Creatinine, Ser: 2.32 mg/dL — ABNORMAL HIGH (ref 0.61–1.24)
GFR, Estimated: 31 mL/min — ABNORMAL LOW (ref >60)
Glucose, Bld: 134 mg/dL — ABNORMAL HIGH (ref 70–99)
Potassium: 3.8 mmol/L (ref 3.5–5.1)
Sodium: 141 mmol/L (ref 135–145)
Total Bilirubin: 1.5 mg/dL — ABNORMAL HIGH (ref 0.3–1.2)
Total Protein: 6.8 g/dL (ref 6.5–8.1)

## 2020-03-16 LAB — CBC
HCT: 44.3 % (ref 39.0–52.0)
Hemoglobin: 14.3 g/dL (ref 13.0–17.0)
MCH: 31 pg (ref 26.0–34.0)
MCHC: 32.3 g/dL (ref 30.0–36.0)
MCV: 95.9 fL (ref 80.0–100.0)
Platelets: 281 10*3/uL (ref 150–400)
RBC: 4.62 MIL/uL (ref 4.22–5.81)
RDW: 13.7 % (ref 11.5–15.5)
WBC: 8 10*3/uL (ref 4.0–10.5)
nRBC: 0 % (ref 0.0–0.2)

## 2020-03-16 LAB — PHOSPHORUS: Phosphorus: 4.6 mg/dL (ref 2.5–4.6)

## 2020-03-16 LAB — MAGNESIUM: Magnesium: 2 mg/dL (ref 1.7–2.4)

## 2020-03-16 SURGERY — ECHOCARDIOGRAM, TRANSESOPHAGEAL
Anesthesia: Monitor Anesthesia Care

## 2020-03-16 MED ORDER — PROPOFOL 500 MG/50ML IV EMUL
INTRAVENOUS | Status: DC | PRN
  Administered 2020-03-16: 125 ug/kg/min via INTRAVENOUS

## 2020-03-16 MED ORDER — PHENYLEPHRINE 40 MCG/ML (10ML) SYRINGE FOR IV PUSH (FOR BLOOD PRESSURE SUPPORT)
PREFILLED_SYRINGE | INTRAVENOUS | Status: DC | PRN
  Administered 2020-03-16 (×2): 160 ug via INTRAVENOUS
  Administered 2020-03-16 (×2): 80 ug via INTRAVENOUS

## 2020-03-16 MED ORDER — BUTAMBEN-TETRACAINE-BENZOCAINE 2-2-14 % EX AERO
INHALATION_SPRAY | CUTANEOUS | Status: DC | PRN
  Administered 2020-03-16: 2 via TOPICAL

## 2020-03-16 MED ORDER — SODIUM CHLORIDE 0.9 % IV SOLN: INTRAVENOUS | Status: DC

## 2020-03-16 MED ORDER — PROPOFOL 10 MG/ML IV BOLUS
INTRAVENOUS | Status: DC | PRN
  Administered 2020-03-16: 40 mg via INTRAVENOUS
  Administered 2020-03-16: 10 mg via INTRAVENOUS

## 2020-03-16 NOTE — Progress Notes (Addendum)
PROGRESS NOTE    Trevor Anderson  TDD:[id number] DOB: 29-Jan-1956 DOA: 03/11/2020 PCP: [name], MD   Brief Narrative:  This 64 years old male with PMH of DM II, CAD (cath 2009 nonobstructive disease), systolic and diastolic CHF (EF 62%), hyperlipidemia, hypertension and medical noncompliance presents in the emergency department with complaints of acute shortness of breath. He reports shortness of breath is progressively getting worse. Patient also reports fall from his chair while watching TV resulting in right hip pain,  worse with weightbearing. Patient was found to be tachycardic and tachypneic, after an evaluation. Patient was started on intravenous heparin with a concern for possible PE. VQ scan is ordered to rule out PE due to worsening renal functions. Patient was admitted for acute on chronic CHF exacerbation, suspected PE, hypertensive urgency, right hip pain. VQ scan negative, heparin discontinued. Patient was found to have right-sided weakness, slurring of his speech and  facial droop during morning round. Code stroke was called. Patient was not deemed a candidate for TPA. Exact time of symptoms unknown. CT head without abnormality in brain parenchyma. Patient was seen by neurology recommended MRI of the brain, PT/ OT consult speech consult. Echocardiogram.  MRI moderate size left basal ganglia stroke.  Cardiology consulted for TEE .PT recommended CIR.   Assessment & Plan:   Principal Problem:   Acute on chronic systolic CHF (congestive heart failure) (HCC) Active Problems:   Type 2 diabetes mellitus without complication, with long-term current use of insulin (HCC)   Mixed hyperlipidemia   Coronary artery disease involving native coronary artery of native heart without angina pectoris   Hypertensive urgency   AKI (acute kidney injury) (HCC)   Fall at home, initial encounter   Right hip pain   Hypokalemia   Cerebral thrombosis with cerebral infarction   Cerebrovascular  accident (CVA) (HCC)  Right-sided weakness secondary to  the stroke. Patient is found to have right-sided weakness in the morning round. Code Stroke called,  neurology evaluated the patient  CTA Head/ Neck : Negative for large vessel occlusion, and no core infarct or ischemic penumbra detected by CT Perfusion. Advised stroke pathway. MRI: Moderate size acute infarction involving the left basal ganglia and adjacent white matter. Neurology recommended dual antiplatelet therapy for now (aspirin and Plavix) Speech and swallow evaluation completed recommended thick liquids. Neurology recommended TEE to rule out embolic source. Patient underwent TEE,  report pending. PT evaluation completed recommended CIR / SNF Neurology will follow.   Acute on chronic systolic CHF (congestive heart failure) (HCC)   Patient presented with 2-week history of paroxysmal nocturnal dyspnea and increasing abdominal girth with 1 week history of progressively worsening shortness of breath.  BNP of 705.    Patient has been noncompliant with his antihypertensives likely precipitating acute flare.  Patient was started on Lasix 40 mg IV twice daily.  Strict input and output monitoring  Monitoring renal function and  electrolytes  Monitoring patient on telemetry  Echocardiogram LVEF 45 to 50% , left ventricle has decreased function.  Global hypokinesis       V Q scan negative, heparin discontinued.     Hypertensive urgency >>> Improving   Patient presented with hypertensive urgency with blood pressures as high as 213/151.    Patient admits to "not always taking his blood pressure medications".  Per review of our records patient has not seen Dr. Jonny Ruiz ( PCP)  since January 2019.  Providing patient with as needed intravenous antihypertensives for systolic blood pressures above  200 at this time.  Blood pressure improving  Placing patient in progressive unit for close monitoring.    AKI (acute kidney  injury) [facility])  Patient presenting with creatinine of 2.11, an increase from 1.19 2 years ago.  Because of patient's lack of outpatient follow-up it is unclear as to whether or not this elevated creatinine is due to acute kidney injury or progression of chronic kidney disease in the setting of uncontrolled blood pressure.  We will manage blood pressure slowly with IV and po Meds  Strict input and output monitoring  Monitoring renal function and electrolytes with serial chemistries  Renal ultrasound unremarkable.    Fall at home, initial encounter  Patient reports suffering a fall at home earlier in the day on 11/18.  Patient complains of right hip pain .  X-ray of the right hip reveals a possible femur fracture -however upon discussion of these images between the emergency department provider and Dr. Cyndia Bent orthopedic surgery disagrees with this read and recommends more advanced imaging.    CT hip shows unremarkable,  no fracture noted.     Type 2 diabetes mellitus without complication, with long-term current use of insulin (HCC)   Accu-Cheks before every meal and nightly with signs scale insulin  Hemoglobin A1c : 6.8  Hold Metformin    Coronary artery disease involving native coronary artery of native heart without angina pectoris   Continue aspirin and beta-blocker, Plavix was added due to stroke.  Patient is chest pain-free  Monitoring patient on telemetry   EKG reveals no evidence of dynamic ST segment changes  Troponin trended down could be due to demand ischemia    Mixed hyperlipidemia    Question whether patient is actually taking statin at this time.    Patient states that he inconsistently takes any medications.  Continue Lipitor 40 mg   Obstructive sleep apnea Patient does have brief periods of apnea during sleep when his respiratory rate goes down to 8. Patient agrees to get enrolled in a  Research study. If sleep study is positive,  he will get  CPAP trial. He has 50% chance of getting CPAP supplies once approved.    DVT prophylaxis: SCDs Code Status: Full Family Communication: spoke with son at bed side. Disposition Plan:  Status is: Inpatient  Remains inpatient appropriate because:Inpatient level of care appropriate due to severity of illness   Dispo: The patient is from: Home              Anticipated d/c is to:  CIR              Anticipated d/c date is: > 3 days              Patient currently is not medically stable to d/c.   Consultants:   Neurology  Procedures:  Antimicrobials:  Anti-infectives (From admission, onward)   None      Subjective: Patient was seen and examined at bedside.  Overnight events noted.  Patient blood pressure has improved.  He appears much better, he has flaccid weakness on the right side. He still has facial droop and slurring of speech.  Objective: Vitals:   03/16/20 1400 03/16/20 1410 03/16/20 1420 03/16/20 1441  BP: 105/74 108/81 127/89 (!) 125/97  Pulse: 79 80 84 82  Resp: (!) 27 (!) 22 (!) 23 16  Temp:    97.8 F (36.6 C)  TempSrc:    Oral  SpO2: 96% 100% 95% 98%  Weight:  Height:        Intake/Output Summary (Last 24 hours) at 03/16/2020 1447 Last data filed at 03/16/2020 1339 Gross per 24 hour  Intake 400 ml  Output 600 ml  Net -200 ml   Filed Weights   03/14/20 0422 03/15/20 0634 03/16/20 0358  Weight: 110.8 kg 110 kg 108.5 kg    Examination:  General exam: Appears calm and comfortable , facial droop, not in distress. Respiratory system: Clear to auscultation. Respiratory effort normal. Cardiovascular system: S1 & S2 heard, RRR. No JVD, murmurs, rubs, gallops or clicks. No pedal edema. Gastrointestinal system: Abdomen is nondistended, soft and nontender. No organomegaly or masses felt.  Normal bowel sounds heard. Central nervous system: Alert and oriented.  Facial droop, slurring of speech Extremities: Right-sided weakness, RUE 0/5 , RLE  1/5 Skin: No rashes, lesions or ulcers Psychiatry: Judgement and insight appear normal. Mood & affect appropriate.     Data Reviewed: I have personally reviewed following labs and imaging studies  CBC: Recent Labs  Lab 03/12/20 0217 03/13/20 0129 03/14/20 0217 03/15/20 0117 03/16/20 0050  WBC 12.4* 10.9* 8.8 7.5 8.0  HGB 13.0 13.5 13.6 13.9 14.3  HCT 40.9 41.9 42.3 43.5 44.3  MCV 96.0 96.8 96.4 95.4 95.9  PLT 295 280 297 280 281   Basic Metabolic Panel: Recent Labs  Lab 03/11/20 1854 03/12/20 0217 03/13/20 0129 03/14/20 0217 03/15/20 0117 03/16/20 0050  NA 141  --  140 140 142 141  K 3.2*  --  3.4* 3.2* 3.2* 3.8  CL 107  --  103 105 106 106  CO2 18*  --  23 24 25  21*  GLUCOSE 146*  --  108* 123* 98 134*  BUN 23  --  31* 35* 36* 38*  CREATININE 2.11*  --  2.38* 2.16* 2.09* 2.32*  CALCIUM 8.9  --  8.5* 8.4* 8.5* 8.5*  MG  --  1.8 2.0 2.1  --  2.0  PHOS  --   --  5.4* 4.6  --  4.6   GFR: Estimated Creatinine Clearance: 41.4 mL/min (A) (by C-G formula based on SCr of 2.32 mg/dL (H)). Liver Function Tests: Recent Labs  Lab 03/16/20 0050  AST 30  ALT 22  ALKPHOS 55  BILITOT 1.5*  PROT 6.8  ALBUMIN 3.0*   No results for input(s): LIPASE, AMYLASE in the last 168 hours. No results for input(s): AMMONIA in the last 168 hours. Coagulation Profile: No results for input(s): INR, PROTIME in the last 168 hours. Cardiac Enzymes: No results for input(s): CKTOTAL, CKMB, CKMBINDEX, TROPONINI in the last 168 hours. BNP (last 3 results) No results for input(s): PROBNP in the last 8760 hours. HbA1C: No results for input(s): HGBA1C in the last 72 hours. CBG: Recent Labs  Lab 03/15/20 0633 03/15/20 1141 03/15/20 1706 03/15/20 2148 03/16/20 0616  GLUCAP 130* 154* 103* 138* 124*   Lipid Profile: No results for input(s): CHOL, HDL, LDLCALC, TRIG, CHOLHDL, LDLDIRECT in the last 72 hours. Thyroid Function Tests: No results for input(s): TSH, T4TOTAL, FREET4, T3FREE,  THYROIDAB in the last 72 hours. Anemia Panel: No results for input(s): VITAMINB12, FOLATE, FERRITIN, TIBC, IRON, RETICCTPCT in the last 72 hours. Sepsis Labs: No results for input(s): PROCALCITON, LATICACIDVEN in the last 168 hours.  Recent Results (from the past 240 hour(s))  Respiratory Panel by RT PCR (Flu A&B, Covid) - Nasopharyngeal Swab     Status: None   Collection Time: 03/11/20  6:15 PM   Specimen: Nasopharyngeal Swab; Nasopharyngeal(NP) swabs  in vial transport medium  Result Value Ref Range Status   SARS Coronavirus 2 by RT PCR NEGATIVE NEGATIVE Final    Comment: (NOTE) SARS-CoV-2 target nucleic acids are NOT DETECTED.  The SARS-CoV-2 RNA is generally detectable in upper respiratoy specimens during the acute phase of infection. The lowest concentration of SARS-CoV-2 viral copies this assay can detect is 131 copies/mL. A negative result does not preclude SARS-Cov-2 infection and should not be used as the sole basis for treatment or other patient management decisions. A negative result may occur with  improper specimen collection/handling, submission of specimen other than nasopharyngeal swab, presence of viral mutation(s) within the areas targeted by this assay, and inadequate number of viral copies (<131 copies/mL). A negative result must be combined with clinical observations, patient history, and epidemiological information. The expected result is Negative.  Fact Sheet for Patients:  https://www.moore.com/  Fact Sheet for Healthcare Providers:  https://www.young.biz/  This test is no t yet approved or cleared by the [place] FDA and  has been authorized for detection and/or diagnosis of SARS-CoV-2 by FDA under an Emergency Use Authorization (EUA). This EUA will remain  in effect (meaning this test can be used) for the duration of the COVID-19 declaration under Section 564(b)(1) of the Act, 21 U.S.C. section 360bbb-3(b)(1),  unless the authorization is terminated or revoked sooner.     Influenza A by PCR NEGATIVE NEGATIVE Final   Influenza B by PCR NEGATIVE NEGATIVE Final    Comment: (NOTE) The Xpert Xpress SARS-CoV-2/FLU/RSV assay is intended as an aid in  the diagnosis of influenza from Nasopharyngeal swab specimens and  should not be used as a sole basis for treatment. Nasal washings and  aspirates are unacceptable for Xpert Xpress SARS-CoV-2/FLU/RSV  testing.  Fact Sheet for Patients: https://www.moore.com/  Fact Sheet for Healthcare Providers: https://www.young.biz/  This test is not yet approved or cleared by the [place] FDA and  has been authorized for detection and/or diagnosis of SARS-CoV-2 by  FDA under an Emergency Use Authorization (EUA). This EUA will remain  in effect (meaning this test can be used) for the duration of the  Covid-19 declaration under Section 564(b)(1) of the Act, 21  U.S.C. section 360bbb-3(b)(1), unless the authorization is  terminated or revoked. Performed at [facility], [address] [place], [place] [address]          Radiology Studies: No results found.   Scheduled Meds:  aspirin EC  81 mg Oral Daily   carvedilol  25 mg Oral BID WC   clopidogrel  75 mg Oral Daily   furosemide  40 mg Intravenous BID   insulin aspart  0-15 Units Subcutaneous TID AC & HS   simvastatin  40 mg Oral Daily   sodium chloride flush  3 mL Intravenous Q12H   Continuous Infusions:  sodium chloride       LOS: 4 days    Time spent: 25 mins    Jodine Muchmore, MD [facility]   If 7PM-7AM, please contact night-coverage

## 2020-03-16 NOTE — Progress Notes (Signed)
  Echocardiogram Echocardiogram Transesophageal has been performed.  Gerda Diss 03/16/2020, 2:22 PM

## 2020-03-16 NOTE — Progress Notes (Signed)
Inpatient Rehab Admissions Coordinator:   Met with patient and his sister at the bedside.  Pt very sleepy, does not stay awake for conversation.  Note VA coverage, which does not contract with [facility]'s inpatient rehab facility.  Will need to refer outside of [facility] for AIR.  I let pt's sister and Marvetta Gibbons, RN CM, know.   Shann Medal, PT, DPT Admissions Coordinator [phone] 03/16/20  12:09 PM

## 2020-03-16 NOTE — Progress Notes (Signed)
Occupational Therapy Treatment Patient Details Name: Trevor Anderson MRN: [id number] DOB: [birth year] Today's Date: 03/16/2020    History of present illness 64 year old male with past medical history of diabetes mellitus type 2, coronary artery disease (cath 2009 with nonobstructive disease), systolic and diastolic congestive heart failure, hyperlipidemia, hypertension and medical noncompliance who presented to [facility] emergency department 11/18 with complaints of shortness of breath. Code stroke called am 11/19 due to acute right sided weakness and dysarthria.   OT comments  Pt progressing towards established OT goals. Pt requiring Total A for peri care at bed level after bowel incontinence. Pt requiring Min A for rolling to R and then Max A for rolling L. Pt performing sit<>stand with Mod A +2 and use of stedy; providing support at RUE for weight bearing and to assist with power up. Pt performing grooming tasks using LUE while seated at stedy; requiring Min-Mod A for sitting balance, Mod A for weight bearing through RUE, and Mod cues for sequencing. Update dc recommendation to CIR for intensive as pt is very motivated. Will continue to follow acutely as admitted.     Follow Up Recommendations  CIR;Supervision/Assistance - 24 hour    Equipment Recommendations  Wheelchair (measurements OT);Wheelchair cushion (measurements OT) (Defer to next venue)    Recommendations for Other Services      Precautions / Restrictions Precautions Precautions: Fall Restrictions Weight Bearing Restrictions: No Other Position/Activity Restrictions: R sided hemiparesis.  Thickened liquids and puree diet.       Mobility Bed Mobility Overal bed mobility: Needs Assistance Bed Mobility: Rolling;Sidelying to Sit Rolling: Min assist;Max assist (Min assist to roll R and max assistance to roll L.) Sidelying to sit: Max assist;+2 for physical assistance       General bed mobility comments: Pt able to  negotiate L side to edge of bed.  Assistance need for R side including self assistance to move RUE with body to avoid leaving behind.  Transfers Overall transfer level: Needs assistance Equipment used: Ambulation equipment used (sara stedy) Transfers: Sit to/from Stand Sit to Stand: Mod assist;+2 physical assistance;From elevated surface (x2 standing trials.)         General transfer comment: Pt able to follow commands to utilize L side of his body to come into standing.  Stedy blocked R knee and PTA facilitated trunk control and weight bearing to R shoulder and head control.  Emphasis on finding midline.    Balance Overall balance assessment: Needs assistance Sitting-balance support: Single extremity supported;Feet supported Sitting balance-Leahy Scale: Poor Sitting balance - Comments: initially pt with no sitting balance requiring max A to remain upright, pt pushing wtih LUE posterior and to R. therapist providing cueing to come forward to the L toward therapist, pt able to correct balance and maintain sitting 2 minutes wtih min A, pt became fatigued and begain pushing posterior and R again Postural control: Posterior lean;Right lateral lean;Other (comment)   Standing balance-Leahy Scale: Zero                 High Level Balance Comments: Pt performed sitting on higher stedy plates with R UE and trunk supported, focusing on trunk control and finding midline while seated in front of the mirror.  15 min total.           ADL either performed or assessed with clinical judgement   ADL Overall ADL's : Needs assistance/impaired     Grooming: Moderate assistance;Wash/dry face;Sitting Grooming Details (indicate cue type and reason): Providing Min-Mod  A for sitting balance due to left lateral lean. Mod A to provide support at elbow and wrist for weight bearing at RUE. Mod cues for engaging and sequencing grooming tasks. Pt washing his face and using mouth swab for oral care.  Performing with LUE, weight bearing through RUE, and correcting posture while seated at stedy.    Upper Body Bathing Details (indicate cue type and reason): Having pt apply lotion to RUE; Mod cues. Pt opening lotion with one hand and then rubbing onto RUE.              Toilet Transfer: Moderate assistance;+2 for physical assistance;+2 for safety/equipment (sit<>stand with stedy)   Toileting- Clothing Manipulation and Hygiene: Total assistance;Bed level         General ADL Comments: Pt performing sit<>stand with stedy and then grooming at sink. Increased cues for attending to RUE.     Vision Patient Visual Report: Other (comment) Vision Assessment?: Vision impaired- to be further tested in functional context   Perception     Praxis      Cognition Arousal/Alertness: Awake/alert Behavior During Therapy: WFL for tasks assessed/performed Overall Cognitive Status: Difficult to assess (due to severe dysarthria.) Area of Impairment: Following commands;Safety/judgement                       Following Commands: Follows one step commands inconsistently;Follows one step commands with increased time Safety/Judgement: Decreased awareness of safety;Decreased awareness of deficits     General Comments: Pt fatigues quickly but willing to work.  Required increased time and redirection to follow commands.        Exercises     Shoulder Instructions       General Comments Sister present during session    Pertinent Vitals/ Pain       Pain Assessment: Faces Faces Pain Scale: Hurts little more Pain Location: R hip Pain Descriptors / Indicators: Sharp Pain Intervention(s): Monitored during session;Limited activity within patient's tolerance;Repositioned  Home Living Family/patient expects to be discharged to:: Private residence Living Arrangements: Children Available Help at Discharge: Family;Available PRN/intermittently Type of Home: House Home Access: Stairs to  enter [employer] of Steps: 3 Entrance Stairs-Rails: Can reach both Home Layout: One level     Bathroom Shower/Tub: Chief Strategy Officer: Standard     Home Equipment: Cane - single point          Prior Functioning/Environment Level of Independence: Independent            Frequency  Min 2X/week        Progress Toward Goals  OT Goals(current goals can now be found in the care plan section)  Progress towards OT goals: Progressing toward goals  Acute Rehab OT Goals Patient Stated Goal: None stated OT Goal Formulation: With patient Time For Goal Achievement: 03/27/20 Potential to Achieve Goals: Fair ADL Goals Pt Will Perform Grooming: with min assist;sitting Pt Will Perform Upper Body Bathing: with min assist;sitting Pt Will Perform Upper Body Dressing: with min assist;sitting Additional ADL Goal #1: Patient will maintain edge of bed sitting balance during functional task with min guard in prep for squat pivot transfers to bed side commode.  Plan Discharge plan needs to be updated    Co-evaluation    PT/OT/SLP Co-Evaluation/Treatment: Yes Reason for Co-Treatment: For patient/therapist safety;To address functional/ADL transfers PT goals addressed during session: Mobility/safety with mobility OT goals addressed during session: ADL's and self-care      AM-PAC OT "6 Clicks" Daily  Activity     Outcome Measure   Help from another person eating meals?: A Lot Help from another person taking care of personal grooming?: A Lot Help from another person toileting, which includes using toliet, bedpan, or urinal?: Total Help from another person bathing (including washing, rinsing, drying)?: A Lot Help from another person to put on and taking off regular upper body clothing?: A Lot Help from another person to put on and taking off regular lower body clothing?: Total 6 Click Score: 10    End of Session    OT Visit Diagnosis: Other abnormalities  of gait and mobility (R26.89);Muscle weakness (generalized) (M62.81);Feeding difficulties (R63.3);Hemiplegia and hemiparesis;Pain Hemiplegia - Right/Left: Right Hemiplegia - dominant/non-dominant: Dominant Hemiplegia - caused by: Cerebral infarction Pain - Right/Left: Right Pain - part of body: Hip   Activity Tolerance Patient tolerated treatment well   Patient Left in chair;with call bell/phone within reach;with family/visitor present;with nursing/sitter in room;with chair alarm set   Nurse Communication Mobility status        Time: 9379-0240 OT Time Calculation (min): 26 min  Charges: OT General Charges $OT Visit: 1 Visit OT Treatments $Self Care/Home Management : 8-22 mins  Saladin Petrelli MSOT, OTR/L Acute Rehab Pager: [phone] Office: [phone]   Theodoro Grist Alba Perillo 03/16/2020, 2:04 PM

## 2020-03-16 NOTE — Interval H&P Note (Signed)
History and Physical Interval Note:  03/16/2020 1:06 PM  Trevor Anderson  has presented today for surgery, with the diagnosis of STROKE.  The various methods of treatment have been discussed with the patient and family. After consideration of risks, benefits and other options for treatment, the patient has consented to  Procedure(s): TRANSESOPHAGEAL ECHOCARDIOGRAM (TEE) (N/A) as a surgical intervention.  The patient's history has been reviewed, patient examined, no change in status, stable for surgery.  I have reviewed the patient's chart and labs.  Questions were answered to the patient's satisfaction.     Cathey Fredenburg Cristal Deer

## 2020-03-16 NOTE — Progress Notes (Signed)
STROKE TEAM PROGRESS NOTE    INTERVAL HISTORY The patient`s  sister is at the bedside.   The patient continues to have significant dysarthria and dense right hemiplegia.   The patient refused participation in the sleep smart study yesterday.  He scheduled for TEE later today.  Vital signs are stable.  Neurological exam is unchanged.  No new complaints.  OBJECTIVE Vitals:   03/16/20 1400 03/16/20 1410 03/16/20 1420 03/16/20 1441  BP: 105/74 108/81 127/89 (!) 125/97  Pulse: 79 80 84 82  Resp: (!) 27 (!) 22 (!) 23 16  Temp:    97.8 F (36.6 C)  TempSrc:    Oral  SpO2: 96% 100% 95% 98%  Weight:      Height:        CBC:  Recent Labs  Lab 03/15/20 0117 03/16/20 0050  WBC 7.5 8.0  HGB 13.9 14.3  HCT 43.5 44.3  MCV 95.4 95.9  PLT 280 281    Basic Metabolic Panel:  Recent Labs  Lab 03/14/20 0217 03/14/20 0217 03/15/20 0117 03/16/20 0050  NA 140   < > 142 141  K 3.2*   < > 3.2* 3.8  CL 105   < > 106 106  CO2 24   < > 25 21*  GLUCOSE 123*   < > 98 134*  BUN 35*   < > 36* 38*  CREATININE 2.16*   < > 2.09* 2.32*  CALCIUM 8.4*   < > 8.5* 8.5*  MG 2.1  --   --  2.0  PHOS 4.6  --   --  4.6   < > = values in this interval not displayed.    Lipid Panel:     Component Value Date/Time   CHOL 232 (H) 03/12/2020 0217   TRIG 67 03/12/2020 0217   HDL 46 03/12/2020 0217   CHOLHDL 5.0 03/12/2020 0217   VLDL 13 03/12/2020 0217   LDLCALC 173 (H) 03/12/2020 0217   HgbA1c:  Lab Results  Component Value Date   HGBA1C 6.8 (H) 03/12/2020   Urine Drug Screen:     Component Value Date/Time   LABOPIA NONE DETECTED 08/21/2007 1200   COCAINSCRNUR NONE DETECTED 08/21/2007 1200   LABBENZ NONE DETECTED 08/21/2007 1200   AMPHETMU NONE DETECTED 08/21/2007 1200   THCU NONE DETECTED 08/21/2007 1200   LABBARB  08/21/2007 1200    NONE DETECTED        DRUG SCREEN FOR MEDICAL PURPOSES ONLY.  IF CONFIRMATION IS NEEDED FOR ANY PURPOSE, NOTIFY LAB WITHIN 5 DAYS.    Alcohol Level No  results found for: [facility]  IMAGING  CT Code Stroke CTA Head W/WO contrast CT Code Stroke CTA Neck W/WO contrast CT Code Stroke Cerebral Perfusion with contrast 03/12/2020 IMPRESSION:  1. Negative for large vessel occlusion, and no core infarct or ischemic penumbra detected by CT Perfusion.  2. Mild for age extracranial but fairly advanced intracranial atherosclerosis. Up to moderate circle-of-Willis branch irregularity and stenoses in the bilateral MCAs (M2 and M3), left PCA (P2), and distal ACAs  .   CT HEAD CODE STROKE WO CONTRAST 03/12/2020 IMPRESSION:  1. Hyperdense Left MCA branch in the left Sylvian fissure suspicious for emergent large vessel occlusion.  2. No acute cortically based infarct or acute intracranial hemorrhage identified. ASPECTS 10.  3. Evidence of advanced underlying small vessel disease.  NM Pulmonary Perfusion 03/12/2020 IMPRESSION:  Normal V/Q scan.  No pulmonary embolus.   DG Swallowing Func-Speech Pathology 03/13/2020 Objective Swallowing Evaluation: Type  of Study: MBS-Modified Barium Swallow Study  Patient Details Name: Trevor Anderson MRN: [id number] Date of Birth: 08/06/1955 Today's Date: 03/13/2020 Time: SLP Start Time (ACUTE ONLY): 1000 -SLP Stop Time (ACUTE ONLY): 1024 SLP Time Calculation (min) (ACUTE ONLY): 24 min Past Medical History: Past Medical History: Diagnosis Date . 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 CONGESTIVE HEART FAILURE 11/12/2007 . DIABETES MELLITUS, TYPE II 11/20/2008 . HYPERLIPIDEMIA 11/12/2007 . HYPERTENSION 11/12/2007 . TACHYCARDIA 01/24/2010 Past Surgical History: Past Surgical History: Procedure Laterality Date . TONSILLECTOMY  [id number] HPI: 64 year old male with past medical history of diabetes mellitus type 2, coronary artery disease (cath 2009 with nonobstructive disease), systolic and diastolic congestive heart failure, hyperlipidemia, hypertension and medical noncompliance who presented to [facility] emergency department 11/18 with  complaints of shortness of breath. Code stroke called am 11/19 due to acute right sided weakness and dysarthria.  W/u pending. Failed RN stroke swallow screen.  Subjective: alert Assessment / Plan / Recommendation CHL IP CLINICAL IMPRESSIONS 03/13/2020 Clinical Impression Pt presents with a primary oral dysphagia with right CN VII and XII weakness with senory and motor impairment leading to difficult lingual manipulation of solids and right sided anterior bolus loss and residue. Oral phase contributes to mild pharyngeal dysphagia with premature spillage and at times, delayed swallow initiation. WIth thin liquids there are instances of silent frank penetration with ejection and trace sensed aspiration with ejection. Pt is dyspneic and mildy impulsive with poor trunk control for adequate positioning and impaired self feeding. Over the weekend recommend a conservative diet of puree and nectar thick liquids, though given full airway protection expect pt to upgrade to thin liquids under supervision. Recommend CIR, will f/u for tolerance of diet.  SLP Visit Diagnosis Dysphagia, oropharyngeal phase (R13.12) Attention and concentration deficit following -- Frontal lobe and executive function deficit following -- Impact on safety and function Moderate aspiration risk   CHL IP TREATMENT RECOMMENDATION 03/12/2020 Treatment Recommendations Therapy as outlined in treatment plan below   Prognosis 03/13/2020 Prognosis for Safe Diet Advancement Good Barriers to Reach Goals -- Barriers/Prognosis Comment -- CHL IP DIET RECOMMENDATION 03/13/2020 SLP Diet Recommendations Dysphagia 2 (Fine chop) solids;Nectar thick liquid Liquid Administration via Cup;Straw Medication Administration Whole meds with puree Compensations Minimize environmental distractions;Lingual sweep for clearance of pocketing;Monitor for anterior loss Postural Changes Seated upright at 90 degrees;Remain semi-upright after after feeds/meals (Comment)   CHL IP OTHER  RECOMMENDATIONS 03/13/2020 Recommended Consults -- Oral Care Recommendations Oral care BID Other Recommendations --   CHL IP FOLLOW UP RECOMMENDATIONS 03/13/2020 Follow up Recommendations Inpatient Rehab   CHL IP FREQUENCY AND DURATION 03/13/2020 Speech Therapy Frequency (ACUTE ONLY) min 2x/week Treatment Duration 1 week      CHL IP ORAL PHASE 03/13/2020 Oral Phase Impaired Oral - Pudding Teaspoon -- Oral - Pudding Cup -- Oral - Honey Teaspoon -- Oral - Honey Cup -- Oral - Nectar Teaspoon -- Oral - Nectar Cup Lingual/palatal residue;Right pocketing in lateral sulci Oral - Nectar Straw Lingual/palatal residue;Right pocketing in lateral sulci;Other (Comment) Oral - Thin Teaspoon -- Oral - Thin Cup Right anterior bolus loss;Right pocketing in lateral sulci;Lingual/palatal residue;Decreased bolus cohesion;Premature spillage;Other (Comment) Oral - Thin Straw Right anterior bolus loss;Right pocketing in lateral sulci;Lingual/palatal residue;Decreased bolus cohesion;Premature spillage;Other (Comment) Oral - Puree Right pocketing in lateral sulci;Lingual/palatal residue Oral - Mech Soft Decreased bolus cohesion;Lingual/palatal residue;Reduced posterior propulsion;Weak lingual manipulation;Impaired mastication Oral - Regular -- Oral - Multi-Consistency -- Oral - Pill WFL Oral Phase - Comment --  CHL IP PHARYNGEAL PHASE 03/13/2020 Pharyngeal  Phase Impaired Pharyngeal- Pudding Teaspoon -- Pharyngeal -- Pharyngeal- Pudding Cup -- Pharyngeal -- Pharyngeal- Honey Teaspoon -- Pharyngeal -- Pharyngeal- Honey Cup -- Pharyngeal -- Pharyngeal- Nectar Teaspoon -- Pharyngeal -- Pharyngeal- Nectar Cup Delayed swallow initiation-pyriform sinuses;Delayed swallow initiation-vallecula Pharyngeal -- Pharyngeal- Nectar Straw Delayed swallow initiation-vallecula;Delayed swallow initiation-pyriform sinuses Pharyngeal -- Pharyngeal- Thin Teaspoon -- Pharyngeal -- Pharyngeal- Thin Cup Delayed swallow initiation-pyriform  sinuses;Penetration/Aspiration before swallow Pharyngeal Material enters airway, CONTACTS cords and then ejected out;Material enters airway, passes BELOW cords then ejected out;Material does not enter airway Pharyngeal- Thin Straw Delayed swallow initiation-pyriform sinuses;Penetration/Aspiration before swallow Pharyngeal Material enters airway, CONTACTS cords and then ejected out;Material enters airway, passes BELOW cords then ejected out;Material does not enter airway Pharyngeal- Puree Delayed swallow initiation-vallecula Pharyngeal -- Pharyngeal- Mechanical Soft Delayed swallow initiation-vallecula Pharyngeal -- Pharyngeal- Regular -- Pharyngeal -- Pharyngeal- Multi-consistency -- Pharyngeal -- Pharyngeal- Pill Delayed swallow initiation-vallecula Pharyngeal -- Pharyngeal Comment --  No flowsheet data found. Harlon Ditty, MA CCC-SLP Acute Rehabilitation Services Pager [phone] Office [phone] Claudine Mouton 03/13/2020, 10:43 AM              ECHOCARDIOGRAM COMPLETE 03/12/2020 IMPRESSIONS   1. Left ventricular ejection fraction, by estimation, is 45 to 50%. The left ventricle has mildly decreased function. The left ventricle demonstrates global hypokinesis. There is mild concentric left ventricular hypertrophy. Left ventricular diastolic parameters are consistent with Grade I diastolic dysfunction (impaired relaxation).   2. Right ventricular systolic function is normal. The right ventricular size is normal. Tricuspid regurgitation signal is inadequate for assessing PA pressure.   3. The mitral valve is grossly normal. Trivial mitral valve regurgitation. No evidence of mitral stenosis.   4. The aortic valve is tricuspid. Aortic valve regurgitation is not visualized. No aortic stenosis is present.   5. The inferior vena cava is normal in size with greater than 50% respiratory variability, suggesting right atrial pressure of 3 mmHg. Comparison(s): Changes from prior study are noted. EF  mildly reduced 45-50% with global HK.   ECG - ST rate 125 BPM. (See cardiology reading for complete details)  PHYSICAL EXAM Blood pressure (!) 125/97, pulse 82, temperature 97.8 F (36.6 C), temperature source Oral, resp. rate 16, height 5\' 11"  (1.803 m), weight 108.5 kg, SpO2 98 %. GENERAL: Obese middle-aged African-American male not in distress HEENT: Supple no trauma noted  ABDOMEN: soft  EXTREMITIES: No edema   BACK: Normal  SKIN: Normal by inspection.    MENTAL STATUS: He is awake and responsive and cooperates with evaluation.  He has moderate to severe dysarthria.  No clear aphasia noted.  CRANIAL NERVES: Pupils are equal, round and reactive to light and accomodation; extra ocular movements are full, there is no significant nystagmus; visual fields are full;  there is plegia of the right lower facial muscles; tongue deviates to the right; uvula is midline  MOTOR: Flaccid right hemiplegia.  0/5 strength.  Left side shows normal tone, bulk and strength.  COORDINATION: No dysmetria, tremor or parkinsonism noted.  No myoclonus.  SENSATION: Normal to light touch, temperature, and pain.    NIH stroke scale 12. Premorbid modified Rankin scale 0           ASSESSMENT/PLAN Mr. KAELOB PERSKY is a 64 y.o. male with history of obesity, DM2, CAD, systolic and diastolic CHF, HLD, HTN and medical noncompliance who presented to the [facility] ED 11/17 with a 2 week history of orthopnea, SOB, increased abdominal girth, and a mild nonproductive cough. On the morning of 11/18  the RN noted that the patient seemed to have significant right sided weakness. He was later found to have a prominent right facial droop and dysarthria.  NIHSS was 10. Code Stroke was called. He did not receive IV t-PA due to being out of the therapeutic window for treatment.  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  Code Stroke CT Head - Hyperdense Left MCA branch in the left Sylvian fissure suspicious for emergent large vessel occlusion. No acute cortically based infarct or acute intracranial hemorrhage identified. ASPECTS 10. Evidence of advanced underlying small vessel disease.  CT head - not ordered  MRI head -moderate large left basal ganglia infarct as well as small right subcortical and left cerebellar white matter infarcts  MRA head - not ordered   CTA H&N - Mild for age extracranial but fairly advanced intracranial atherosclerosis. Up to moderate circle-of-Willis branch irregularity and stenoses in the bilateral MCAs (M2 and M3), left PCA (P2), and distal ACAs   CT Perfusion - no core infarct or ischemic penumbra detected by CT Perfusion.   Carotid Doppler - CTA neck ordered - carotid dopplers not indicated.  2D Echo - EF 45 - 50%. No cardiac source of emboli identified.   Sars Corona Virus 2 - negative  LDL - 173  HgbA1c - 6.8  UDS - not ordered  VTE prophylaxis - none Diet  Diet Order    None      aspirin 81 mg daily prior to admission, now on aspirin 81 mg daily and clopidogrel 75 mg daily  Patient counseled to be compliant with his antithrombotic medications  Ongoing aggressive stroke risk factor management  Therapy recommendations:  Skilled Nursing Facility placement recommended by therapists  Disposition:  Pending  Hypertension  Home BP meds: Coreg ; Norvasc ; Lisinopril ; hydralazine  Current BP meds: Coreg  Stable . Permissive hypertension (OK if < 220/120) but gradually normalize in 5-7 days  . Long-term BP goal normotensive  Hyperlipidemia  Home Lipid lowering medication: Zocor 40 mg daily  LDL 173, goal < 70  Current lipid lowering medication: Zocor 40 mg daily  Continue statin at  discharge  Diabetes  Home diabetic meds: insulin ; metformin  Current diabetic meds: SSI   HgbA1c 6.8, goal < 7.0 Recent Labs    03/15/20 1706 03/15/20 2148 03/16/20 0616  GLUCAP 103* 138* 124*    Other Stroke Risk Factors  Advanced age  ETOH use, advised to drink no more than 1 alcoholic beverage per day.  Obesity, Body mass index is 33.36 kg/m., recommend weight loss, diet and exercise as appropriate   Hx of medical non compliance  Coronary artery disease  Congestive Heart Failure  Other Active Problems  Code status - Full code  Hypokalemia - potassium - 3.2 - supplemented  Acute vs CKD - creatinine - 2.16   Hospital day # 4  Patient has bicerebral infarcts though mostly subcortical the left basal ganglia infarct appears large enough to worry about cryptogenic's stroke.  Recommend checking transesophageal echocardiogram for cardiac source of embolism and PFO and if negative consider loop recorder for long-term monitoring for paroxysmal A. fib.  Continue ongoing therapies, mobilize out of bed.   Discussed with patient and his sister and Dr. Lucianne Muss.  Greater than 50% time during this 25-minute visit was  spent on counseling and coordination of care about his bicerebral strokes and discussion about evaluation and treatment plan and answering questions. Delia Heady, MD  To contact Stroke Continuity provider, please refer to [email or web address]. After hours, contact General Neurology

## 2020-03-16 NOTE — Progress Notes (Signed)
Physical Therapy Treatment Patient Details Name: Trevor Anderson MRN: [id number] DOB: 1956-01-11 Today's Date: 03/16/2020    History of Present Illness HPI: 64 year old male with past medical history of diabetes mellitus type 2, coronary artery disease (cath 2009 with nonobstructive disease), systolic and diastolic congestive heart failure, hyperlipidemia, hypertension and medical noncompliance who presented to [facility] emergency department 11/18 with complaints of shortness of breath. Code stroke called am 11/19 due to acute right sided weakness and dysarthria.    PT Comments    Pt supine in bed on arrival this session with incontinence of bowels.  He required assistance to roll to R ( min A ) and L ( max A ) side for pericare. Pt more fluid with movements and easier to assist.  He required mod +2 to rise into standing from elevated bed height.  He remains to use stedy to block R knee in standing and promote weight bearing.  Continue to recommend aggressive rehab in CIR to promote functional gains before returning home.     Follow Up Recommendations  CIR;Supervision/Assistance - 24 hour     Equipment Recommendations  Wheelchair (measurements PT);Wheelchair cushion (measurements PT);3in1 (PT);Hospital bed (lift pad and lift)    Recommendations for Other Services       Precautions / Restrictions Precautions Precautions: Fall Restrictions Weight Bearing Restrictions: No RUE Weight Bearing: Non weight bearing RLE Weight Bearing: Non weight bearing Other Position/Activity Restrictions: R sided hemiparesis.  Thickened liquids and puree diet.    Mobility  Bed Mobility Overal bed mobility: Needs Assistance Bed Mobility: Rolling Rolling: Min assist;Max assist (Min assist to roll R and max assistance to roll L.) Sidelying to sit: Max assist;+2 for physical assistance       General bed mobility comments: Pt able to negotiate L side to edge of bed.  Assistance need for R side  including self assistance to move RUE with body to avoid leaving behind.  Transfers Overall transfer level: Needs assistance Equipment used: Ambulation equipment used (sara stedy) Transfers: Sit to/from Stand Sit to Stand: Mod assist;+2 physical assistance;From elevated surface (x2 standing trials.)         General transfer comment: Pt able to follow commands to utilize L side of his body to come into standing.  Stedy blocked R knee and PTA facilitated trunk control and weight bearing to R shoulder and head control.  Emphasis on finding midline.  Ambulation/Gait                 Stairs             Wheelchair Mobility    Modified Rankin (Stroke Patients Only)       Balance Overall balance assessment: Needs assistance   Sitting balance-Leahy Scale: Poor       Standing balance-Leahy Scale: Zero                              Cognition Arousal/Alertness: Awake/alert Behavior During Therapy: WFL for tasks assessed/performed Overall Cognitive Status: Difficult to assess (due to severe dysarthria.) Area of Impairment: Following commands;Safety/judgement                       Following Commands: Follows one step commands inconsistently;Follows one step commands with increased time Safety/Judgement: Decreased awareness of safety;Decreased awareness of deficits     General Comments: Pt fatigues quickly but willing to work.  Required increased time and redirection to follow  commands.      Exercises      General Comments        Pertinent Vitals/Pain Pain Assessment: Faces Faces Pain Scale: Hurts little more Pain Location: R hip Pain Descriptors / Indicators: Sharp Pain Intervention(s): Monitored during session;Repositioned    Home Living                      Prior Function            PT Goals (current goals can now be found in the care plan section) Acute Rehab PT Goals Patient Stated Goal: None stated Potential to  Achieve Goals: Fair Progress towards PT goals: Progressing toward goals    Frequency    Min 4X/week      PT Plan Current plan remains appropriate    Co-evaluation PT/OT/SLP Co-Evaluation/Treatment: Yes Reason for Co-Treatment: Complexity of the patient's impairments (multi-system involvement) PT goals addressed during session: Mobility/safety with mobility OT goals addressed during session: ADL's and self-care      AM-PAC PT "6 Clicks" Mobility   Outcome Measure  Help needed turning from your back to your side while in a flat bed without using bedrails?: Total Help needed moving from lying on your back to sitting on the side of a flat bed without using bedrails?: Total Help needed moving to and from a bed to a chair (including a wheelchair)?: Total Help needed standing up from a chair using your arms (e.g., wheelchair or bedside chair)?: Total Help needed to walk in hospital room?: Total Help needed climbing 3-5 steps with a railing? : Total 6 Click Score: 6    End of Session Equipment Utilized During Treatment: Gait belt Activity Tolerance: Patient tolerated treatment well Patient left: with call bell/phone within reach;with family/visitor present;in chair;with chair alarm set (lift pad under patient's bottom.) Nurse Communication: Mobility status PT Visit Diagnosis: Unsteadiness on feet (R26.81);Other abnormalities of gait and mobility (R26.89);Muscle weakness (generalized) (M62.81);Hemiplegia and hemiparesis;Pain Hemiplegia - Right/Left: Right Hemiplegia - dominant/non-dominant: Dominant Hemiplegia - caused by: Cerebral infarction Pain - Right/Left: Right Pain - part of body: Hip     Time: 0076-2263 PT Time Calculation (min) (ACUTE ONLY): 25 min  Charges:  $Therapeutic Activity: 8-22 mins                     Bonney Leitz , PTA Acute Rehabilitation Services Pager [phone] Office [phone]     Yamilee Harmes Artis Delay 03/16/2020, 11:58 AM

## 2020-03-16 NOTE — Anesthesia Postprocedure Evaluation (Signed)
Anesthesia Post Note  Patient: Trevor Anderson  Procedure(s) Performed: TRANSESOPHAGEAL ECHOCARDIOGRAM (TEE) (N/A ) BUBBLE STUDY     Patient location during evaluation: Endoscopy Anesthesia Type: MAC Level of consciousness: awake and alert Pain management: pain level controlled Vital Signs Assessment: post-procedure vital signs reviewed and stable Respiratory status: spontaneous breathing, nonlabored ventilation, respiratory function stable and patient connected to nasal cannula oxygen Cardiovascular status: blood pressure returned to baseline and stable Postop Assessment: no apparent nausea or vomiting Anesthetic complications: no   No complications documented.  Last Vitals:  Vitals:   03/16/20 1420 03/16/20 1441  BP: 127/89 (!) 125/97  Pulse: 84 82  Resp: (!) 23 16  Temp:  36.6 C  SpO2: 95% 98%    Last Pain:  Vitals:   03/16/20 1441  TempSrc: Oral  PainSc:                  Yliana Gravois DANIEL

## 2020-03-16 NOTE — Progress Notes (Signed)
.  Mobility Specialist: Progress Note   03/16/20 1746  Mobility  Activity Stood at bedside  Level of Assistance +2 (takes two people)  Assistive Device [place] Stood 1 minutes  Mobility Response Tolerated well  Mobility performed by Mobility specialist;Nurse tech  $Mobility charge 1 Mobility   Pre-Mobility: 81 HR, 139/117 BP, 99% SpO2 Post-Mobility: 85 HR, 95% SpO2  Pt stood at [employer] at the side of the bed with assistance from NT and myself. Pt stood for 1 minute for pericare. Will f/u tomorrow.   [facility] Dasiah Hooley Mobility Specialist

## 2020-03-16 NOTE — CV Procedure (Addendum)
    TRANSESOPHAGEAL ECHOCARDIOGRAM   NAME:  Trevor Anderson   MRN: [id number] DOB:  [birth year]   ADMIT DATE: 03/11/2020  INDICATIONS: Stroke  PROCEDURE:   Informed consent was obtained prior to the procedure. The risks, benefits and alternatives for the procedure were discussed and the patient comprehended these risks.  Risks include, but are not limited to, cough, sore throat, vomiting, nausea, somnolence, esophageal and stomach trauma or perforation, bleeding, low blood pressure, aspiration, pneumonia, infection, trauma to the teeth and death.    Procedural time out performed. The oropharynx was anesthetized with topical 1% cetacaine.    Patient received monitored anesthesia care under the supervision of Dr. Krista Blue. Patient received a total of 373 mg propofol during the procedure.  The transesophageal probe was inserted in the esophagus and stomach without difficulty and multiple views were obtained.    COMPLICATIONS:    There were no immediate complications.  FINDINGS:  LEFT VENTRICLE: EF = 45%. Global hypokinesis  RIGHT VENTRICLE: Normal size and function.   LEFT ATRIUM: No thrombus/mass.  LEFT ATRIAL APPENDAGE: No thrombus/mass.   RIGHT ATRIUM: No thrombus/mass.  AORTIC VALVE:  Trileaflet. Trivial regurgitation. No vegetation.  MITRAL VALVE:    Normal structure. Mild-moderate regurgitation. No vegetation.  TRICUSPID VALVE: Normal structure. Mild regurgitation. No vegetation.  PULMONIC VALVE: Grossly normal structure. Trivial regurgitation. No apparent vegetation.  INTERATRIAL SEPTUM: Very small left to right flow seen by color Doppler. However, negative bubble study. This suggests small ASD/PFO without right to left shunting.  PERICARDIUM: Trivial effusion noted.  DESCENDING AORTA: Moderate diffuse plaque seen   CONCLUSION: Trivial color Doppler signal suggests tiny ASD/PFO, but negative bubble study shows no right to left flow.   Jodelle Red, MD,  PhD [facility]  [address] [place], [place] [address] [phone]   1:40 PM

## 2020-03-16 NOTE — Anesthesia Preprocedure Evaluation (Signed)
Anesthesia Evaluation  Patient identified by MRN, date of birth, ID band Patient awake    Reviewed: Allergy & Precautions, NPO status , Patient's Chart, lab work & pertinent test results  History of Anesthesia Complications Negative for: history of anesthetic complications  Airway Mallampati: III  TM Distance: >3 FB Neck ROM: Full    Dental  (+) Dental Advisory Given   Pulmonary neg pulmonary ROS,    Pulmonary exam normal        Cardiovascular hypertension, Pt. on medications and Pt. on home beta blockers + CAD and +CHF  Normal cardiovascular exam  IMPRESSIONS    1. Left ventricular ejection fraction, by estimation, is 45 to 50%. The  left ventricle has mildly decreased function. The left ventricle  demonstrates global hypokinesis. There is mild concentric left ventricular  hypertrophy. Left ventricular diastolic  parameters are consistent with Grade I diastolic dysfunction (impaired  relaxation).  2. Right ventricular systolic function is normal. The right ventricular  size is normal. Tricuspid regurgitation signal is inadequate for assessing  PA pressure.  3. The mitral valve is grossly normal. Trivial mitral valve  regurgitation. No evidence of mitral stenosis.  4. The aortic valve is tricuspid. Aortic valve regurgitation is not  visualized. No aortic stenosis is present.  5. The inferior vena cava is normal in size with greater than 50%  respiratory variability, suggesting right atrial pressure of 3 mmHg.   Comparison(s): Changes from prior study are noted. EF mildly reduced  45-50% with global HK.    Neuro/Psych CVA, Residual Symptoms    GI/Hepatic negative GI ROS, Neg liver ROS,   Endo/Other  diabetes  Renal/GU Renal disease     Musculoskeletal negative musculoskeletal ROS (+)   Abdominal   Peds  Hematology negative hematology ROS (+)   Anesthesia Other Findings   Reproductive/Obstetrics                              Anesthesia Physical Anesthesia Plan  ASA: III  Anesthesia Plan: MAC   Post-op Pain Management:    Induction: Intravenous  PONV Risk Score and Plan: 1 and Propofol infusion  Airway Management Planned: Natural Airway  Additional Equipment:   Intra-op Plan:   Post-operative Plan:   Informed Consent: I have reviewed the patients History and Physical, chart, labs and discussed the procedure including the risks, benefits and alternatives for the proposed anesthesia with the patient or authorized representative who has indicated his/her understanding and acceptance.     Dental advisory given  Plan Discussed with: CRNA, Anesthesiologist and Surgeon  Anesthesia Plan Comments:         Anesthesia Quick Evaluation

## 2020-03-16 NOTE — Transfer of Care (Signed)
Immediate Anesthesia Transfer of Care Note  Patient: Trevor Anderson  Procedure(s) Performed: TRANSESOPHAGEAL ECHOCARDIOGRAM (TEE) (N/A ) BUBBLE STUDY  Patient Location: Endoscopy Unit  Anesthesia Type:MAC  Level of Consciousness: drowsy and patient cooperative  Airway & Oxygen Therapy: Patient Spontanous Breathing  Post-op Assessment: Report given to RN and Post -op Vital signs reviewed and stable  Post vital signs: Reviewed and stable  Last Vitals:  Vitals Value Taken Time  BP 101/59 03/16/20 1351  Temp    Pulse 80 03/16/20 1353  Resp 32 03/16/20 1353  SpO2 98 % 03/16/20 1353  Vitals shown include unvalidated device data.  Last Pain:  Vitals:   03/16/20 1246  TempSrc: Oral  PainSc: 0-No pain         Complications: No complications documented.

## 2020-03-16 NOTE — TOC Progression Note (Signed)
Transition of Care [facility]) - Progression Note    Patient Details  Name: Trevor Anderson MRN: [id number] Date of Birth: 07-06-1955  Transition of Care [facility]) CM/SW Contact  Eduard Roux, [place] Phone Number: 03/16/2020, 12:30 PM  Clinical Narrative:     [facility] -CSW requesting needed forms-CSW waiting on response.  Antony Blackbird, MSW, LCSWA Clinical Social Worker   Expected Discharge Plan: Skilled Nursing Facility Barriers to Discharge: Continued Medical Work up, SNF Pending bed offer, Inadequate or no insurance  Expected Discharge Plan and Services Expected Discharge Plan: Skilled Nursing Facility In-house Referral: Clinical Social Work                                             Social Determinants of Health (SDOH) Interventions    Readmission Risk Interventions No flowsheet data found.

## 2020-03-17 ENCOUNTER — Encounter (HOSPITAL_COMMUNITY): Payer: Self-pay | Admitting: Cardiology

## 2020-03-17 LAB — COMPREHENSIVE METABOLIC PANEL WITH GFR
ALT: 26 U/L (ref 0–44)
AST: 34 U/L (ref 15–41)
Albumin: 2.9 g/dL — ABNORMAL LOW (ref 3.5–5.0)
Alkaline Phosphatase: 58 U/L (ref 38–126)
Anion gap: 13 (ref 5–15)
BUN: 38 mg/dL — ABNORMAL HIGH (ref 8–23)
CO2: 25 mmol/L (ref 22–32)
Calcium: 8.5 mg/dL — ABNORMAL LOW (ref 8.9–10.3)
Chloride: 103 mmol/L (ref 98–111)
Creatinine, Ser: 2.08 mg/dL — ABNORMAL HIGH (ref 0.61–1.24)
GFR, Estimated: 35 mL/min — ABNORMAL LOW
Glucose, Bld: 103 mg/dL — ABNORMAL HIGH (ref 70–99)
Potassium: 3.4 mmol/L — ABNORMAL LOW (ref 3.5–5.1)
Sodium: 141 mmol/L (ref 135–145)
Total Bilirubin: 0.9 mg/dL (ref 0.3–1.2)
Total Protein: 6.6 g/dL (ref 6.5–8.1)

## 2020-03-17 LAB — GLUCOSE, CAPILLARY
Glucose-Capillary: 105 mg/dL — ABNORMAL HIGH (ref 70–99)
Glucose-Capillary: 173 mg/dL — ABNORMAL HIGH (ref 70–99)
Glucose-Capillary: 129 mg/dL — ABNORMAL HIGH (ref 70–99)
Glucose-Capillary: 112 mg/dL — ABNORMAL HIGH (ref 70–99)
Glucose-Capillary: 145 mg/dL — ABNORMAL HIGH (ref 70–99)
Glucose-Capillary: 92 mg/dL (ref 70–99)
Glucose-Capillary: 150 mg/dL — ABNORMAL HIGH (ref 70–99)
Glucose-Capillary: 116 mg/dL — ABNORMAL HIGH (ref 70–99)
Glucose-Capillary: 197 mg/dL — ABNORMAL HIGH (ref 70–99)

## 2020-03-17 LAB — MAGNESIUM: Magnesium: 2.1 mg/dL (ref 1.7–2.4)

## 2020-03-17 LAB — PHOSPHORUS: Phosphorus: 4.8 mg/dL — ABNORMAL HIGH (ref 2.5–4.6)

## 2020-03-17 LAB — CBC
HCT: 44.7 % (ref 39.0–52.0)
Hemoglobin: 14.3 g/dL (ref 13.0–17.0)
MCH: 30.5 pg (ref 26.0–34.0)
MCHC: 32 g/dL (ref 30.0–36.0)
MCV: 95.3 fL (ref 80.0–100.0)
Platelets: 234 K/uL (ref 150–400)
RBC: 4.69 MIL/uL (ref 4.22–5.81)
RDW: 13.3 % (ref 11.5–15.5)
WBC: 7.6 K/uL (ref 4.0–10.5)
nRBC: 0 % (ref 0.0–0.2)

## 2020-03-17 MED ORDER — POTASSIUM CHLORIDE CRYS ER 20 MEQ PO TBCR
40.0000 meq | EXTENDED_RELEASE_TABLET | Freq: Every day | ORAL | Status: DC
  Filled 2020-03-17: qty 2

## 2020-03-17 NOTE — Progress Notes (Addendum)
  Speech Language Pathology Treatment: Dysphagia;Cognitive-Linquistic (Dysarthria)  Patient Details Name: Trevor Anderson MRN: [id number] DOB: Jan 08, 1956 Today's Date: 03/17/2020 Time: 1015-1040 SLP Time Calculation (min) (ACUTE ONLY): 25 min  Assessment / Plan / Recommendation Clinical Impression  Pt was seen for treatment and was cooperative during the session. Pt's NT reported that the pt has been tolerating the current diet without overt s/sx of aspiration. No s/sx of aspiration were noted with trials of dysphagia 3 solids or with regular textures. Mastication was thorough and no significant oral residue was noted. Pt exhibited coughing with 2/13 boluses of thin liquids via cup and straw; one via straw and the other via cup. Signs of aspiration appeared to be related to bolus size on both occasions. It is recommended that his diet be advanced to dysphagia 3 solids with thin liquids with continued full supervision. Pt's speech intelligibility was improved today but repetition and clarification were still frequently needed. Episodes of dyspnea were not observed during the session. Pt was educated regarding the nature of dysarthria, and compensatory strategies to improve speech intelligibility. Pt verbalized understanding regarding all areas of education. He used compensatory strategies at the phrase level with 40% accuracy increasing to 60% accuracy with cues and models. At the word level he demonstrated 60% accuracy increasing to 80% accuracy with cues and intermittent models for overarticulation and vocal intensity. SLP will continue to follow pt.    HPI HPI: 64 year old male with past medical history of diabetes mellitus type 2, coronary artery disease (cath 2009 with nonobstructive disease), systolic and diastolic congestive heart failure, hyperlipidemia, hypertension and medical noncompliance who presented to [facility] emergency department 11/18 with complaints of shortness of breath. Code  stroke called am 11/19 due to acute right sided weakness and dysarthria.  Failed RN stroke swallow screen. MRI brain:11/22: Moderate size acute infarction involving the left basal ganglia and adjacent white matter. Additional small acute infarcts involving the right corona radiata and basal ganglia and left paramedian cerebellum.      SLP Plan  Continue with current plan of care       Recommendations  Diet recommendations: Thin liquid;Dysphagia 3 (mechanical soft) Liquids provided via: Cup;Straw Medication Administration: Crushed with puree Supervision: Full supervision/cueing for compensatory strategies;Staff to assist with self feeding Compensations: Minimize environmental distractions;Lingual sweep for clearance of pocketing;Monitor for anterior loss;Follow solids with liquid Postural Changes and/or Swallow Maneuvers: Seated upright 90 degrees                Oral Care Recommendations: Oral care BID Follow up Recommendations: Inpatient Rehab SLP Visit Diagnosis: Dysphagia, oropharyngeal phase (R13.12) Plan: Continue with current plan of care       Trevor Goldner I. Vear Clock, MS, CCC-SLP Acute Rehabilitation Services Office number [phone] Pager [phone]                Scheryl Marten 03/17/2020, 10:51 AM

## 2020-03-17 NOTE — Progress Notes (Signed)
STROKE TEAM PROGRESS NOTE    INTERVAL HISTORY Patient is sitting up in bed.  His speech appears improved today with less dysarthria.  Continues to have dense right hemiplegia.  TEE is scheduled for Friday.  Vital signs stable.  No new neurological changes.  OBJECTIVE Vitals:   03/17/20 0000 03/17/20 0400 03/17/20 0647 03/17/20 0800  BP: (!) 162/101 (!) 162/108  (!) 177/125  Pulse: 81 76  83  Resp: (!) 24 (!) 24    Temp: 98.7 F (37.1 C) 98.6 F (37 C)  98.6 F (37 C)  TempSrc: Oral Oral  Oral  SpO2: 97% 96%    Weight:   110.4 kg   Height:       CBC:  Recent Labs  Lab 03/16/20 0050 03/17/20 0019  WBC 8.0 7.6  HGB 14.3 14.3  HCT 44.3 44.7  MCV 95.9 95.3  PLT 281 234   Basic Metabolic Panel:  Recent Labs  Lab 03/16/20 0050 03/17/20 0019  NA 141 141  K 3.8 3.4*  CL 106 103  CO2 21* 25  GLUCOSE 134* 103*  BUN 38* 38*  CREATININE 2.32* 2.08*  CALCIUM 8.5* 8.5*  MG 2.0 2.1  PHOS 4.6 4.8*   Lipid Panel:     Component Value Date/Time   CHOL 232 (H) 03/12/2020 0217   TRIG 67 03/12/2020 0217   HDL 46 03/12/2020 0217   CHOLHDL 5.0 03/12/2020 0217   VLDL 13 03/12/2020 0217   LDLCALC 173 (H) 03/12/2020 0217   HgbA1c:  Lab Results  Component Value Date   HGBA1C 6.8 (H) 03/12/2020   Urine Drug Screen:     Component Value Date/Time   LABOPIA NONE DETECTED 08/21/2007 1200   COCAINSCRNUR NONE DETECTED 08/21/2007 1200   LABBENZ NONE DETECTED 08/21/2007 1200   AMPHETMU NONE DETECTED 08/21/2007 1200   THCU NONE DETECTED 08/21/2007 1200   LABBARB  08/21/2007 1200    NONE DETECTED        DRUG SCREEN FOR MEDICAL PURPOSES ONLY.  IF CONFIRMATION IS NEEDED FOR ANY PURPOSE, NOTIFY LAB WITHIN 5 DAYS.    Alcohol Level No results found for: [facility]  IMAGING ECHO TEE  Result Date: 03/16/2020    TRANSESOPHOGEAL ECHO REPORT   Patient Name:   Trevor Anderson Date of Exam: 03/16/2020 Medical Rec #:  [id number]     Height:       71.0 in Accession #:    [id number]    Weight:        239.2 lb Date of Birth:  12/05/1955     BSA:          2.275 m Patient Age:    64 years      BP:           150/119 mmHg Patient Gender: M             HR:           86 bpm. Exam Location:  Inpatient Procedure: Transesophageal Echo, Cardiac Doppler and Color Doppler Indications:     Stroke  History:         Patient has prior history of Echocardiogram examinations, most                  recent 03/12/2020. CHF and Cardiomyopathy; Risk                  Factors:Hypertension, Diabetes, Dyslipidemia and Non-Smoker.  Sonographer:     Ross Ludwig RDCS (AE) Referring Phys:  [phone]  DAYNA N DUNN Diagnosing Phys: Jodelle Red MD PROCEDURE: After discussion of the risks and benefits of a TEE, an informed consent was obtained from the patient. The transesophogeal probe was passed without difficulty through the esophogus of the patient. Local oropharyngeal anesthetic was provided with Cetacaine. Sedation performed by different physician. The patient was monitored while under deep sedation. Anesthestetic sedation was provided intravenously by Anesthesiology: 372.79mg  of Propofol. Image quality was adequate. The patient developed no complications during the procedure. IMPRESSIONS  1. Left ventricular ejection fraction, by estimation, is 45 to 50%. The left ventricle has mildly decreased function. The left ventricle demonstrates global hypokinesis.  2. Right ventricular systolic function is normal. The right ventricular size is normal.  3. No left atrial/left atrial appendage thrombus was detected.  4. The mitral valve is normal in structure. Mild to moderate mitral valve regurgitation. No evidence of mitral stenosis.  5. The aortic valve is tricuspid. Aortic valve regurgitation is trivial. No aortic stenosis is present.  6. There is Moderate (Grade III) plaque involving the descending aorta.  7. Evidence of atrial level shunting detected by color flow Doppler. Agitated saline contrast bubble study was negative, with no evidence  of any interatrial shunt. There is a small patent foramen ovale with predominantly left to right shunting across the atrial septum. Conclusion(s)/Recommendation(s): Very small left to right intra-atrial flow seen by color Doppler. However, negative bubble study. This suggests small ASD/PFO without right to left shunting. FINDINGS  Left Ventricle: Left ventricular ejection fraction, by estimation, is 45 to 50%. The left ventricle has mildly decreased function. The left ventricle demonstrates global hypokinesis. The left ventricular internal cavity size was normal in size. Right Ventricle: The right ventricular size is normal. No increase in right ventricular wall thickness. Right ventricular systolic function is normal. Left Atrium: Left atrial size was not assessed. No left atrial/left atrial appendage thrombus was detected. Right Atrium: Right atrial size was not assessed. Pericardium: There is no evidence of pericardial effusion. Mitral Valve: The mitral valve is normal in structure. Mild to moderate mitral valve regurgitation. No evidence of mitral valve stenosis. Tricuspid Valve: The tricuspid valve is normal in structure. Tricuspid valve regurgitation is mild . No evidence of tricuspid stenosis. Aortic Valve: The aortic valve is tricuspid. Aortic valve regurgitation is trivial. No aortic stenosis is present. Pulmonic Valve: The pulmonic valve was grossly normal. Pulmonic valve regurgitation is trivial. No evidence of pulmonic stenosis. Aorta: The aortic root and ascending aorta are structurally normal, with no evidence of dilitation. There is moderate (Grade III) plaque involving the descending aorta. IAS/Shunts: Evidence of atrial level shunting detected by color flow Doppler. Agitated saline contrast was given intravenously to evaluate for intracardiac shunting. Agitated saline contrast bubble study was negative, with no evidence of any interatrial shunt. A small patent foramen ovale is detected with  predominantly left to right shunting across the atrial septum.  TRICUSPID VALVE TR Peak grad:   23.8 mmHg TR Vmax:        244.00 cm/s Jodelle Red MD Electronically signed by Jodelle Red MD Signature Date/Time: 03/16/2020/3:55:39 PM    Final     PHYSICAL EXAM    Blood pressure (!) 177/125, pulse 83, temperature 98.6 F (37 C), temperature source Oral, resp. rate (!) 24, height 5\' 11"  (1.803 m), weight 110.4 kg, SpO2 96 %. GENERAL: Obese middle-aged African-American male not in distress HEENT: Supple no trauma noted  ABDOMEN: soft  EXTREMITIES: No edema   BACK: Normal  SKIN: Normal by inspection.  MENTAL STATUS: He is awake and responsive and cooperates with evaluation.  He has moderate dysarthria.  No clear aphasia noted.  CRANIAL NERVES: Pupils are equal, round and reactive to light and accomodation; extra ocular movements are full, there is no significant nystagmus; visual fields are full;  there is plegia of the right lower facial muscles; tongue deviates to the right; uvula is midline  MOTOR: Flaccid right hemiplegia.  0/5 strength.  Left side shows normal tone, bulk and strength.  COORDINATION: No dysmetria, tremor or parkinsonism noted.  No myoclonus.  SENSATION: Normal to light touch, temperature, and pain.  NIH stroke scale 12.   ASSESSMENT/PLAN Trevor Anderson is a 64 y.o. male with history of obesity, DM2, CAD, systolic and diastolic CHF, HLD, HTN and medical noncompliance who presented to the [facility] ED 11/17 with a 2 week history of orthopnea, SOB, increased abdominal girth, and a mild nonproductive cough. On the morning of 11/18 the RN noted that the patient seemed to have significant right sided weakness. He was later found to have a prominent right facial droop and dysarthria.  NIHSS was 10. Code Stroke was called. He did not receive IV t-PA due to being out of the therapeutic window for treatment.  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  Code Stroke CT Head - Hyperdense Left MCA branch in the left Sylvian fissure suspicious for emergent large vessel occlusion. No acute cortically based infarct or acute intracranial hemorrhage identified. ASPECTS 10. Evidence of advanced underlying small vessel disease.  MRI head -moderate large left basal ganglia infarct as well as small right subcortical and left cerebellar white matter infarcts  CTA H&N - Mild for age extracranial but fairly advanced intracranial atherosclerosis. Up to moderate circle-of-Willis branch irregularity and stenoses in the bilateral MCAs (M2 and M3), left PCA (P2), and distal ACAs   CT Perfusion - no core infarct or ischemic penumbra detected by CT Perfusion.   2D Echo - EF 45 - 50%. No cardiac source of emboli identified.   TEE - tiny ASD/PFO, neg bubble R to L flow   LE doppler pending   [facility] electrophysiologist will consult and consider placement of an implantable loop recorder to evaluate for atrial fibrillation as etiology of stroke. This has been explained to patient/family by Dr. Pearlean Brownie and they are agreeable.   [employer] Virus 2 - negative  LDL - 173  HgbA1c - 6.8  UDS - not ordered  VTE prophylaxis - added SCDs  aspirin 81 mg daily prior to admission, now on aspirin 81 mg daily and clopidogrel 75 mg daily  Therapy recommendations:  Skilled Nursing Facility vs CIR  Disposition:  Pending  Hypertension  Hypertensive Urgency, resolved  Home BP meds: Coreg ; Norvasc ; Lisinopril ; hydralazine  Stable . Permissive hypertension (OK if < 220/120) but gradually normalize in 5-7 days  . Long-term BP goal normotensive  Hyperlipidemia  Home Lipid lowering medication:  Zocor 40 mg daily  LDL 173, goal < 70  Current lipid lowering medication: Zocor 40 mg daily  Continue statin at discharge  Diabetes type II  Home diabetic meds: insulin ; metformin  HgbA1c 6.8, goal < 7.0  Other Stroke Risk Factors  Advanced age  ETOH use, advised to drink no more  than 1 alcoholic beverage per day.  Obesity, Body mass index is 33.95 kg/m., recommend weight loss, diet and exercise as appropriate   Hx of medical non compliance  Coronary artery disease  Acute on chronic systolic Congestive Heart Failure  obstructive sleep apnea, declined SleepSmart participation  Other Active Problems Hypokalemia - potassium - 3.4  AKI - creatinine - 2.08 Fall at home  Hospital day # 5 Continue ongoing therapies.  Await TEE.  Continue dual antiplatelet therapy. Delia Heady, MD  To contact Stroke Continuity provider, please refer to [email or web address]. After hours, contact General Neurology

## 2020-03-17 NOTE — TOC Progression Note (Addendum)
Transition of Care [facility]) - Progression Note    Patient Details  Name: Trevor Anderson MRN: [id number] Date of Birth: 09-28-1955  [facility]) CM/SW Contact  Eduard Roux, [place] Phone Number: 03/17/2020, 2:38 PM  Clinical Narrative:     CSW spoke with the patient's son,Trevor Anderson. He advised family wants to pursue inpatient rehab- He believes family will be able to provide the support needed after inpatient rehab. Patient has [facility] CIR  is out of network therefore he has chosen inpatient rehab at [facility] in [place]. RNCM Silva Bandy will follow up and start the process.  Antony Blackbird, MSW, LCSWA Clinical Social Worker   Expected Discharge Plan: Skilled Nursing Facility Barriers to Discharge: Continued Medical Work up, SNF Pending bed offer, Inadequate or no insurance  Expected Discharge Plan and Services Expected Discharge Plan: Skilled Nursing Facility In-house Referral: Clinical Social Work                                             Social Determinants of Health (SDOH) Interventions    Readmission Risk Interventions No flowsheet data found.

## 2020-03-17 NOTE — Progress Notes (Addendum)
Physical Therapy Treatment Patient Details Name: Trevor Anderson MRN: [id number] DOB: [birth year] Today's Date: 03/17/2020    History of Present Illness 64 year old male with past medical history of diabetes mellitus type 2, coronary artery disease (cath 2009 with nonobstructive disease), systolic and diastolic congestive heart failure, hyperlipidemia, hypertension and medical noncompliance who presented to [facility] emergency department 11/18 with complaints of shortness of breath. Code stroke called am 11/19 due to acute right sided weakness and dysarthria.    PT Comments    Pt continues to improve he required decreased assistance and time to achieve standing this session.  Focused on bed to chair today as his lunch tray arrived.  Continue to recommend aggressive CIR in a post acute setting to maximize functional gains before returning home.     Follow Up Recommendations  CIR;Supervision/Assistance - 24 hour     Equipment Recommendations  Wheelchair (measurements PT);Wheelchair cushion (measurements PT);3in1 (PT);Hospital bed (lift pad and lift)    Recommendations for Other Services OT consult;Rehab consult     Precautions / Restrictions Precautions Precautions: Fall Restrictions Weight Bearing Restrictions: No Other Position/Activity Restrictions: R sided hemiparesis.    Mobility  Bed Mobility Overal bed mobility: Needs Assistance Bed Mobility: Rolling;Sidelying to Sit Rolling: Min assist (to the R side.) Sidelying to sit: Mod assist;Max assist;+2 for safety/equipment Supine to sit: Mod assist;+2 for physical assistance     General bed mobility comments: Pt able to roll to the R for pericare, Pt transitioned into sitting with Mod assistance on R side but then presents with LOB to the R requiring max assistance to complete sidelying to sit.  Transfers Overall transfer level: Needs assistance Equipment used: Ambulation equipment used Transfers: Sit to/from  Stand Sit to Stand: Mod assist;+2 physical assistance;From elevated surface         General transfer comment: Pt able to follow commands to utilize L side of his body to come into standing.  Stedy blocked R knee and OT facilitated trunk control and weight bearing to R shoulder and head control.  Emphasis on finding midline.  Facilitation of head control to rise into standing.  Ambulation/Gait                 Stairs             Wheelchair Mobility    Modified Rankin (Stroke Patients Only)       Balance Overall balance assessment: Needs assistance Sitting-balance support: Single extremity supported;Feet supported Sitting balance-Leahy Scale: Poor Sitting balance - Comments: initially pt with no sitting balance requiring max A to remain upright, pt pushing wtih LUE posterior and to R. therapist providing cueing to come forward to the L toward therapist, pt able to correct balance and maintain briefly before standing Postural control: Posterior lean;Right lateral lean;Other (comment)   Standing balance-Leahy Scale: Zero                              Cognition Arousal/Alertness: Awake/alert Behavior During Therapy: WFL for tasks assessed/performed Overall Cognitive Status: Difficult to assess Area of Impairment: Following commands;Safety/judgement                       Following Commands: Follows one step commands inconsistently;Follows one step commands with increased time Safety/Judgement: Decreased awareness of safety;Decreased awareness of deficits     General Comments: He speech is improving reporting, "yup", and "yeah".  He remains dysarthric.  Pt able  to recall his sisters name," Alice."      Exercises      General Comments        Pertinent Vitals/Pain Pain Assessment: No/denies pain Faces Pain Scale: Hurts little more Pain Location: R hip Pain Descriptors / Indicators: Sharp Pain Intervention(s): Monitored during  session;Repositioned    Home Living                      Prior Function            PT Goals (current goals can now be found in the care plan section) Acute Rehab PT Goals Patient Stated Goal: None stated Potential to Achieve Goals: Fair Progress towards PT goals: Progressing toward goals    Frequency    Min 4X/week      PT Plan Current plan remains appropriate    Co-evaluation PT/OT/SLP Co-Evaluation/Treatment: Yes Reason for Co-Treatment: Complexity of the patient's impairments (multi-system involvement) PT goals addressed during session: Mobility/safety with mobility OT goals addressed during session: ADL's and self-care      AM-PAC PT "6 Clicks" Mobility   Outcome Measure  Help needed turning from your back to your side while in a flat bed without using bedrails?: A Little Help needed moving from lying on your back to sitting on the side of a flat bed without using bedrails?: Total Help needed moving to and from a bed to a chair (including a wheelchair)?: Total Help needed standing up from a chair using your arms (e.g., wheelchair or bedside chair)?: Total Help needed to walk in hospital room?: Total Help needed climbing 3-5 steps with a railing? : Total 6 Click Score: 8    End of Session   Activity Tolerance: Patient tolerated treatment well Patient left: with call bell/phone within reach;with family/visitor present;in chair;with chair alarm set (lift pad under patient's bottom.) Nurse Communication: Mobility status PT Visit Diagnosis: Unsteadiness on feet (R26.81);Other abnormalities of gait and mobility (R26.89);Muscle weakness (generalized) (M62.81);Hemiplegia and hemiparesis;Pain Hemiplegia - Right/Left: Right Hemiplegia - dominant/non-dominant: Dominant Hemiplegia - caused by: Cerebral infarction Pain - Right/Left: Right Pain - part of body: Hip     Time: 9935-7017 PT Time Calculation (min) (ACUTE ONLY): 24 min  Charges:  $Therapeutic  Activity: 8-22 mins                     Bonney Leitz , PTA Acute Rehabilitation Services Pager [phone] Office [phone]     Gypsy Kellogg Artis Delay 03/17/2020, 12:05 PM

## 2020-03-17 NOTE — Progress Notes (Signed)
Occupational Therapy Treatment Patient Details Name: Trevor Anderson MRN: [id number] DOB: 13-Dec-1955 Today's Date: 03/17/2020    History of present illness 64 year old male with past medical history of diabetes mellitus type 2, coronary artery disease (cath 2009 with nonobstructive disease), systolic and diastolic congestive heart failure, hyperlipidemia, hypertension and medical noncompliance who presented to [facility] emergency department 11/18 with complaints of shortness of breath. Code stroke called am 11/19 due to acute right sided weakness and dysarthria.   OT comments  Patient continues to demonstrate a willingness to participate and improve his functional status despite the severity of his deficits.  He is following one step commands better, and has improved his sitting tolerance and balance at edge of bed.  OT and PT have been working on sit to stand with the steady lift to improve stand tolerance and balance in preparation for transfer progression.  See below for deficts impacting independence.  OT will continue to follow in the acute setting.  CIR has been recommended.    Follow Up Recommendations  CIR;Supervision/Assistance - 24 hour    Equipment Recommendations  Wheelchair (measurements OT);Wheelchair cushion (measurements OT)    Recommendations for Other Services      Precautions / Restrictions Precautions Precautions: Fall Restrictions Weight Bearing Restrictions: No Other Position/Activity Restrictions: R sided hemiparesis.       Mobility Bed Mobility Overal bed mobility: Needs Assistance Bed Mobility: Rolling;Sidelying to Sit Rolling: Min assist (to the R side.) Sidelying to sit: Mod assist;Max assist;+2 for safety/equipment Supine to sit: Mod assist;+2 for physical assistance     General bed mobility comments: Pt able to roll to the R for pericare, Pt transitioned into sitting with Mod assistance on R side but then presents with LOB to the R requiring  max assistance to complete sidelying to sit.  Transfers Overall transfer level: Needs assistance Equipment used: Ambulation equipment used Transfers: Sit to/from Stand Sit to Stand: Mod assist;+2 physical assistance;From elevated surface         General transfer comment: Pt able to follow commands to utilize L side of his body to come into standing.  Stedy blocked R knee and OT facilitated trunk control and weight bearing to R shoulder and head control.  Emphasis on finding midline.  Facilitation of head control to rise into standing.    Balance Overall balance assessment: Needs assistance Sitting-balance support: Single extremity supported;Feet supported Sitting balance-Leahy Scale: Poor Sitting balance - Comments: initially pt with no sitting balance requiring max A to remain upright, pt pushing wtih LUE posterior and to R. therapist providing cueing to come forward to the L toward therapist, pt able to correct balance and maintain briefly before standing Postural control: Posterior lean;Right lateral lean;Other (comment)   Standing balance-Leahy Scale: Zero                             ADL either performed or assessed with clinical judgement   ADL   Eating/Feeding: Set up;Minimal assistance;Sitting   Grooming: Wash/dry face;Set up                   Toilet Transfer: Moderate assistance;+2 for physical assistance;+2 for safety/equipment                                     Cognition Arousal/Alertness: Awake/alert Behavior During Therapy: WFL for tasks assessed/performed Overall Cognitive Status:  Difficult to assess Area of Impairment: Following commands;Safety/judgement                       Following Commands: Follows one step commands inconsistently;Follows one step commands with increased time Safety/Judgement: Decreased awareness of safety;Decreased awareness of deficits     General Comments: He speech is improving reporting,  "yup", and "yeah".  He remains dysarthric.  Pt able to recall his sisters name," Alice."        Exercises     Shoulder Instructions       General Comments  VSS    Pertinent Vitals/ Pain       Pain Assessment: No/denies pain Faces Pain Scale: Hurts little more Pain Location: R hip Pain Descriptors / Indicators: Sharp Pain Intervention(s): Monitored during session;Repositioned                                                          Frequency  Min 2X/week        Progress Toward Goals  OT Goals(current goals can now be found in the care plan section)  Progress towards OT goals: Progressing toward goals  Acute Rehab OT Goals Patient Stated Goal: None stated OT Goal Formulation: With patient Time For Goal Achievement: 03/27/20 Potential to Achieve Goals: Fair  Plan Discharge plan remains appropriate    Co-evaluation    PT/OT/SLP Co-Evaluation/Treatment: Yes Reason for Co-Treatment: Complexity of the patient's impairments (multi-system involvement) PT goals addressed during session: Mobility/safety with mobility OT goals addressed during session: ADL's and self-care      AM-PAC OT "6 Clicks" Daily Activity     Outcome Measure   Help from another person eating meals?: A Lot Help from another person taking care of personal grooming?: A Lot Help from another person toileting, which includes using toliet, bedpan, or urinal?: Total Help from another person bathing (including washing, rinsing, drying)?: A Lot Help from another person to put on and taking off regular upper body clothing?: A Lot Help from another person to put on and taking off regular lower body clothing?: Total 6 Click Score: 10    End of Session    OT Visit Diagnosis: Other abnormalities of gait and mobility (R26.89);Muscle weakness (generalized) (M62.81);Feeding difficulties (R63.3);Hemiplegia and hemiparesis;Pain Hemiplegia - Right/Left: Right Hemiplegia -  dominant/non-dominant: Dominant   Activity Tolerance Patient tolerated treatment well   Patient Left in chair;with call bell/phone within reach;with family/visitor present;with nursing/sitter in room;with chair alarm set   Nurse Communication          Time: 4132-4401 OT Time Calculation (min): 24 min  Charges: OT General Charges $OT Visit: 1 Visit OT Treatments $Self Care/Home Management : 8-22 mins  03/17/2020  Rich, OTR/L  Acute Rehabilitation Services  Office:  [phone]    Suzanna Obey 03/17/2020, 12:05 PM

## 2020-03-17 NOTE — Progress Notes (Signed)
Mobility Specialist: Progress Note   03/17/20 1731  Mobility  Activity Refused mobility   Pt refused mobility as he has just received dinner and wants to eat.   [facility] Marleah Beever Mobility Specialist

## 2020-03-17 NOTE — Progress Notes (Signed)
PROGRESS NOTE  Trevor Anderson QIH:[id number] DOB: Feb 04, 1956 DOA: 03/11/2020 PCP: [name], MD  HPI/Recap of past 24 hours: This 64 years old male with PMH of DMII,CAD(cath 2009 nonobstructive disease), systolic and diastolic CHF(EF 87%),FIEPPIRJJOACZY, hypertension and medical noncompliance presents in the emergency department withcomplaints of acute shortness of breath. He reports shortness of breath is progressivelygetting worse. Patient also reports fall from his chair while watching TV resulting in right hip pain,worse with weightbearing. Patient was found to be tachycardic and tachypneic, after an evaluation.Patient was started on intravenous heparin with a concern for possible PE. VQ scan is ordered to rule out PE due to worsening renal functions. Patient was admitted for acute on chronic CHF exacerbation, suspected PE, hypertensive urgency, right hip pain. VQ scan negative, heparin discontinued. Patient was found to have right-sided weakness, slurring of his speech and facial droop during morning round. Code stroke was called.Patient was not deemed a candidate for TPA. Exact time of symptoms unknown. CT head without abnormality in brain parenchyma. Patient was seen by neurology recommended MRI of the brain,PT/OT consult speech consult. Echocardiogram.  MRI moderate size left basal ganglia stroke.  Cardiology consulted for TEE .PT recommended CIR.  03/17/20: Seen and examined at his bedside.  He is eating a soft diet with the assistance of nursing staff, with aspiration precautions, slowly, without difficulty.  Aphasic.  No new complaints.  Assessment/Plan: Principal Problem:   Acute on chronic systolic CHF (congestive heart failure) (HCC) Active Problems:   Type 2 diabetes mellitus without complication, with long-term current use of insulin (HCC)   Mixed hyperlipidemia   Coronary artery disease involving native coronary artery of native heart without angina  pectoris   Hypertensive urgency   AKI (acute kidney injury) (HCC)   Fall at home, initial encounter   Right hip pain   Hypokalemia   Cerebral thrombosis with cerebral infarction   Cerebrovascular accident (CVA) (HCC)  Right-sided weakness secondary to right cerebral infarct with a large left basal ganglia infarct with resultant right hemiplegia. Patient is found to have right-sided weakness in morning round 03/12/20. Code Stroke called,  neurology evaluated the patient  CTA Head/ Neck : Negative for large vessel occlusion, and no core infarct or ischemic penumbra detected by CT Perfusion. Advised stroke pathway. MRI: Moderate size acute infarction involving the left basal ganglia and adjacent white matter. Neurology recommended dual antiplatelet therapy for now (aspirin and Plavix) Speech therapist following recommendation for dysphagia pure, mechanical soft, thin liquid.Marland Kitchen Neurology recommended TEE to rule out embolic source, no embolic source. Small ASD/PFO without right to left shunting.  PT OT recommended CIR with 24-hour supervision.  Acute on chronic systolic CHF (congestive heart failure) (HCC)  Patient presented with 2-week history of paroxysmal nocturnal dyspnea and increasing abdominal girth with 1 week history of progressively worsening shortness of breath.  BNP of 705.  Patient has been noncompliant with his antihypertensives likely precipitating acute flare.  Patient was started on Lasix 40 mg IV twice daily.  Strict input and output monitoring  Monitoring renal function and  electrolytes  Monitoring patient on telemetry  Echocardiogram LVEF 45 to 50% , left ventricle has decreased function.  Global hypokinesis       V Q scan negative, heparin discontinued. Strict I's and O's and daily Net I&O -4.7 L Continue aspirin, Coreg 25 mg twice daily, Plavix 75 mg daily, IV Lasix 40 mg twice daily, Zocor 40 mg daily.  Hypokalemia likely secondary to diuretics Serum  potassium 3.4,  magnesium 2.1 Currently on IV Lasix 40 mg twice daily Added daily potassium replacement. Repeat BMP in the a.m.  AKI Baseline creatinine was 1.1 in 2019 Presented with creatinine of greater than 2.3 Currently creatinine 2.0 with GFR of 35. Avoid nephrotoxins and hypotension. Monitor urine output Repeat BMP in the morning  Resolved hypertensive urgency  Patient presented with hypertensive urgency with blood pressures as high as 213/151.   Blood pressure is at goal.  Currently on Coreg 25 mg twice daily and IV Lasix 40 mg twice daily.  Continue to monitor vital signs.  Fall at home, initial encounter  Patient reports suffering a fall at home earlier in the day on 11/18.  Patient complains of right hip pain .  X-ray of the right hip reveals a possible femur fracture-however upon discussion of these images between the emergency department provider and [name] surgery disagrees with this read and recommends more advanced imaging.   CT hip shows unremarkable,  no fracture noted.  Type 2 diabetes mellitus without complication, with long-term current use of insulin (HCC)  Hemoglobin A1c : 6.8  Continue to hold off home oral hypoglycemics  Continue insulin sliding scale  Coronary artery disease involving native coronary artery of native heart without angina pectoris  Continue aspirin, Plavix and statin.    Patient denies any anginal symptoms at the time of this visit.  Troponin S peaked at 129 and trended down  No evidence of acute ischemia on twelve-lead EKG.  Mixed hyperlipidemia LDL is 173 on 03/12/2020 not at goal>> goal less than 70. Continue Zocor 40 mg daily  Obstructive sleep apnea Patient does have brief periods of apnea during sleep when his respiratory rate goes down to 8. Patient declined participation in the sleep smart study on 03/15/2020 per neurology/stroke team.  Ambulatory dysfunction status post acute CVA PT  recommended CIR. vehicle versus no contrast with consult inpatient rehab facility. TOC consulted to assist with SNF placement, following, pending bed placement.    DVT prophylaxis: SCDs Code Status: Full Family Communication:  None at bedside.  Disposition Plan:  Status is: Inpatient  Remains inpatient appropriate because:Inpatient level of care appropriate due to severity of illness   Dispo: The patient is from: Home  Anticipated d/c is to:  CIR or SNF  Anticipated d/c date is: > 3 days  Patient currently is not medically stable to d/c, ongoing diuresing.   Consultants:   Neurology  Procedures:  Antimicrobials:     Anti-infectives (From admission, onward)       Objective: Vitals:   03/17/20 0400 03/17/20 0647 03/17/20 0800 03/17/20 1220  BP: (!) 162/108  (!) 177/125 110/79  Pulse: 76  83 80  Resp: (!) 24   20  Temp: 98.6 F (37 C)  98.6 F (37 C) 98.5 F (36.9 C)  TempSrc: Oral  Oral Oral  SpO2: 96%   99%  Weight:  110.4 kg    Height:        Intake/Output Summary (Last 24 hours) at 03/17/2020 1356 Last data filed at 03/17/2020 1200 Gross per 24 hour  Intake 920 ml  Output 920 ml  Net 0 ml   Filed Weights   03/15/20 0634 03/16/20 0358 03/17/20 0647  Weight: 110 kg 108.5 kg 110.4 kg    Exam:  . General: 64 y.o. year-old male well developed well nourished in no acute distress.  Alert and oriented x3.  Aphasic. . Cardiovascular: Regular rate and rhythm with no rubs or gallops.  No  thyromegaly or JVD noted.   Marland Kitchen Respiratory: Clear to auscultation with no wheezes or rales. Good inspiratory effort. . Abdomen: Soft nontender nondistended with normal bowel sounds x4 quadrants. . Musculoskeletal: No lower extremity edema bilaterally.   Marland Kitchen Psychiatry: Mood is appropriate for condition and setting   Data Reviewed: CBC: Recent Labs  Lab 03/13/20 0129 03/14/20 0217 03/15/20 0117 03/16/20 0050 03/17/20 0019   WBC 10.9* 8.8 7.5 8.0 7.6  HGB 13.5 13.6 13.9 14.3 14.3  HCT 41.9 42.3 43.5 44.3 44.7  MCV 96.8 96.4 95.4 95.9 95.3  PLT 280 297 280 281 234   Basic Metabolic Panel: Recent Labs  Lab 03/11/20 1854 03/12/20 0217 03/13/20 0129 03/14/20 0217 03/15/20 0117 03/16/20 0050 03/17/20 0019  NA   < >  --  140 140 142 141 141  K   < >  --  3.4* 3.2* 3.2* 3.8 3.4*  CL   < >  --  103 105 106 106 103  CO2   < >  --  23 24 25  21* 25  GLUCOSE   < >  --  108* 123* 98 134* 103*  BUN   < >  --  31* 35* 36* 38* 38*  CREATININE   < >  --  2.38* 2.16* 2.09* 2.32* 2.08*  CALCIUM   < >  --  8.5* 8.4* 8.5* 8.5* 8.5*  MG  --  1.8 2.0 2.1  --  2.0 2.1  PHOS  --   --  5.4* 4.6  --  4.6 4.8*   < > = values in this interval not displayed.   GFR: Estimated Creatinine Clearance: 46.5 mL/min (A) (by C-G formula based on SCr of 2.08 mg/dL (H)). Liver Function Tests: Recent Labs  Lab 03/16/20 0050 03/17/20 0019  AST 30 34  ALT 22 26  ALKPHOS 55 58  BILITOT 1.5* 0.9  PROT 6.8 6.6  ALBUMIN 3.0* 2.9*   No results for input(s): LIPASE, AMYLASE in the last 168 hours. No results for input(s): AMMONIA in the last 168 hours. Coagulation Profile: No results for input(s): INR, PROTIME in the last 168 hours. Cardiac Enzymes: No results for input(s): CKTOTAL, CKMB, CKMBINDEX, TROPONINI in the last 168 hours. BNP (last 3 results) No results for input(s): PROBNP in the last 8760 hours. HbA1C: No results for input(s): HGBA1C in the last 72 hours. CBG: Recent Labs  Lab 03/15/20 1141 03/15/20 1706 03/15/20 2148 03/16/20 0616 03/17/20 1146  GLUCAP 154* 103* 138* 124* 150*   Lipid Profile: No results for input(s): CHOL, HDL, LDLCALC, TRIG, CHOLHDL, LDLDIRECT in the last 72 hours. Thyroid Function Tests: No results for input(s): TSH, T4TOTAL, FREET4, T3FREE, THYROIDAB in the last 72 hours. Anemia Panel: No results for input(s): VITAMINB12, FOLATE, FERRITIN, TIBC, IRON, RETICCTPCT in the last 72  hours. Urine analysis:    Component Value Date/Time   COLORURINE STRAW (A) 03/12/2020 0444   APPEARANCEUR CLEAR 03/12/2020 0444   LABSPEC 1.008 03/12/2020 0444   PHURINE 7.0 03/12/2020 0444   GLUCOSEU NEGATIVE 03/12/2020 0444   GLUCOSEU NEGATIVE 05/03/2017 1231   HGBUR MODERATE (A) 03/12/2020 0444   BILIRUBINUR NEGATIVE 03/12/2020 0444   KETONESUR 5 (A) 03/12/2020 0444   PROTEINUR 100 (A) 03/12/2020 0444   UROBILINOGEN 0.2 05/03/2017 1231   NITRITE NEGATIVE 03/12/2020 0444   LEUKOCYTESUR NEGATIVE 03/12/2020 0444   Sepsis Labs: @LABRCNTIP (procalcitonin:4,lacticidven:4)  ) Recent Results (from the past 240 hour(s))  Respiratory Panel by RT PCR (Flu A&B, Covid) - Nasopharyngeal Swab  Status: None   Collection Time: 03/11/20  6:15 PM   Specimen: Nasopharyngeal Swab; Nasopharyngeal(NP) swabs in vial transport medium  Result Value Ref Range Status   SARS Coronavirus 2 by RT PCR NEGATIVE NEGATIVE Final    Comment: (NOTE) SARS-CoV-2 target nucleic acids are NOT DETECTED.  The SARS-CoV-2 RNA is generally detectable in upper respiratoy specimens during the acute phase of infection. The lowest concentration of SARS-CoV-2 viral copies this assay can detect is 131 copies/mL. A negative result does not preclude SARS-Cov-2 infection and should not be used as the sole basis for treatment or other patient management decisions. A negative result may occur with  improper specimen collection/handling, submission of specimen other than nasopharyngeal swab, presence of viral mutation(s) within the areas targeted by this assay, and inadequate number of viral copies (<131 copies/mL). A negative result must be combined with clinical observations, patient history, and epidemiological information. The expected result is Negative.  Fact Sheet for Patients:  https://www.moore.com/  Fact Sheet for Healthcare Providers:  https://www.young.biz/  This test is  no t yet approved or cleared by the [place] FDA and  has been authorized for detection and/or diagnosis of SARS-CoV-2 by FDA under an Emergency Use Authorization (EUA). This EUA will remain  in effect (meaning this test can be used) for the duration of the COVID-19 declaration under Section 564(b)(1) of the Act, 21 U.S.C. section 360bbb-3(b)(1), unless the authorization is terminated or revoked sooner.     Influenza A by PCR NEGATIVE NEGATIVE Final   Influenza B by PCR NEGATIVE NEGATIVE Final    Comment: (NOTE) The Xpert Xpress SARS-CoV-2/FLU/RSV assay is intended as an aid in  the diagnosis of influenza from Nasopharyngeal swab specimens and  should not be used as a sole basis for treatment. Nasal washings and  aspirates are unacceptable for Xpert Xpress SARS-CoV-2/FLU/RSV  testing.  Fact Sheet for Patients: https://www.moore.com/  Fact Sheet for Healthcare Providers: https://www.young.biz/  This test is not yet approved or cleared by the [place] FDA and  has been authorized for detection and/or diagnosis of SARS-CoV-2 by  FDA under an Emergency Use Authorization (EUA). This EUA will remain  in effect (meaning this test can be used) for the duration of the  Covid-19 declaration under Section 564(b)(1) of the Act, 21  U.S.C. section 360bbb-3(b)(1), unless the authorization is  terminated or revoked. Performed at [facility], [address], [place], [place] [address]       Studies: ECHO TEE  Result Date: 03/16/2020    TRANSESOPHOGEAL ECHO REPORT   Patient Name:   Trevor Anderson Date of Exam: 03/16/2020 Medical Rec #:  [id number]     Height:       71.0 in Accession #:    [id number]    Weight:       239.2 lb Date of Birth:  04/12/1956     BSA:          2.275 m Patient Age:    64 years      BP:           150/119 mmHg Patient Gender: M             HR:           86 bpm. Exam Location:  Inpatient Procedure: Transesophageal  Echo, Cardiac Doppler and Color Doppler Indications:     Stroke  History:         Patient has prior history of Echocardiogram examinations, most  recent 03/12/2020. CHF and Cardiomyopathy; Risk                  Factors:Hypertension, Diabetes, Dyslipidemia and Non-Smoker.  Sonographer:     Ross Ludwig RDCS (AE) Referring Phys:  [id number] Tacey Ruiz DUNN Diagnosing Phys: Jodelle Red MD PROCEDURE: After discussion of the risks and benefits of a TEE, an informed consent was obtained from the patient. The transesophogeal probe was passed without difficulty through the esophogus of the patient. Local oropharyngeal anesthetic was provided with Cetacaine. Sedation performed by different physician. The patient was monitored while under deep sedation. Anesthestetic sedation was provided intravenously by Anesthesiology: 372.79mg  of Propofol. Image quality was adequate. The patient developed no complications during the procedure. IMPRESSIONS  1. Left ventricular ejection fraction, by estimation, is 45 to 50%. The left ventricle has mildly decreased function. The left ventricle demonstrates global hypokinesis.  2. Right ventricular systolic function is normal. The right ventricular size is normal.  3. No left atrial/left atrial appendage thrombus was detected.  4. The mitral valve is normal in structure. Mild to moderate mitral valve regurgitation. No evidence of mitral stenosis.  5. The aortic valve is tricuspid. Aortic valve regurgitation is trivial. No aortic stenosis is present.  6. There is Moderate (Grade III) plaque involving the descending aorta.  7. Evidence of atrial level shunting detected by color flow Doppler. Agitated saline contrast bubble study was negative, with no evidence of any interatrial shunt. There is a small patent foramen ovale with predominantly left to right shunting across the atrial septum. Conclusion(s)/Recommendation(s): Very small left to right intra-atrial flow seen by color  Doppler. However, negative bubble study. This suggests small ASD/PFO without right to left shunting. FINDINGS  Left Ventricle: Left ventricular ejection fraction, by estimation, is 45 to 50%. The left ventricle has mildly decreased function. The left ventricle demonstrates global hypokinesis. The left ventricular internal cavity size was normal in size. Right Ventricle: The right ventricular size is normal. No increase in right ventricular wall thickness. Right ventricular systolic function is normal. Left Atrium: Left atrial size was not assessed. No left atrial/left atrial appendage thrombus was detected. Right Atrium: Right atrial size was not assessed. Pericardium: There is no evidence of pericardial effusion. Mitral Valve: The mitral valve is normal in structure. Mild to moderate mitral valve regurgitation. No evidence of mitral valve stenosis. Tricuspid Valve: The tricuspid valve is normal in structure. Tricuspid valve regurgitation is mild . No evidence of tricuspid stenosis. Aortic Valve: The aortic valve is tricuspid. Aortic valve regurgitation is trivial. No aortic stenosis is present. Pulmonic Valve: The pulmonic valve was grossly normal. Pulmonic valve regurgitation is trivial. No evidence of pulmonic stenosis. Aorta: The aortic root and ascending aorta are structurally normal, with no evidence of dilitation. There is moderate (Grade III) plaque involving the descending aorta. IAS/Shunts: Evidence of atrial level shunting detected by color flow Doppler. Agitated saline contrast was given intravenously to evaluate for intracardiac shunting. Agitated saline contrast bubble study was negative, with no evidence of any interatrial shunt. A small patent foramen ovale is detected with predominantly left to right shunting across the atrial septum.  TRICUSPID VALVE TR Peak grad:   23.8 mmHg TR Vmax:        244.00 cm/s MD Electronically signed by Jodelle Red MD Signature Date/Time:  03/16/2020/3:55:39 PM    Final     Scheduled Meds: . aspirin EC  81 mg Oral Daily  . carvedilol  25 mg Oral BID WC  .  clopidogrel  75 mg Oral Daily  . furosemide  40 mg Intravenous BID  . insulin aspart  0-15 Units Subcutaneous TID AC & HS  . simvastatin  40 mg Oral Daily  . sodium chloride flush  3 mL Intravenous Q12H    Continuous Infusions: . sodium chloride       LOS: 5 days     Darlin Drop, MD [facility] Pager [phone]  If 7PM-7AM, please contact night-coverage [email or web address] Password [email or web address] 03/17/2020, 1:56 PM

## 2020-03-17 NOTE — TOC Progression Note (Signed)
Transition of Care (TOC) - Progression Note  Donn Pierini RN, BSN Transitions of Care Unit 4E- RN Case Manager See Treatment Team for direct phone #    Patient Details  Name: Trevor Anderson MRN: [id number] Date of Birth: May 14, 1955  [facility]) CM/SW Contact  Zenda Alpers, Lenn Sink, RN Phone Number: 03/17/2020, 4:29 PM  Clinical Narrative:    Noted CIR note from 11/23 that [facility]- per CSW son would like to look at other INPT rehab options and has selected [facility] as first choice-  Call made to Jace with [facility] - they do contract with the [place]- due to the holiday it may be into next week until they receive Auth- however they will try to submit Friday. Will fax needed info to [facility] to start the process.  TOC to follow   Expected Discharge Plan: IP Rehab Facility Barriers to Discharge: Continued Medical Work up, SNF Pending bed offer, Inadequate or no insurance  Expected Discharge Plan and Services Expected Discharge Plan: IP Rehab Facility In-house Referral: Clinical Social Work Discharge Planning Services: CM Consult Post Acute Care Choice: IP Rehab                                         Social Determinants of Health (SDOH) Interventions    Readmission Risk Interventions No flowsheet data found.

## 2020-03-18 ENCOUNTER — Inpatient Hospital Stay (HOSPITAL_COMMUNITY): Payer: No Typology Code available for payment source

## 2020-03-18 DIAGNOSIS — I639 Cerebral infarction, unspecified: Secondary | ICD-10-CM

## 2020-03-18 LAB — GLUCOSE, CAPILLARY
Glucose-Capillary: 112 mg/dL — ABNORMAL HIGH (ref 70–99)
Glucose-Capillary: 123 mg/dL — ABNORMAL HIGH (ref 70–99)
Glucose-Capillary: 108 mg/dL — ABNORMAL HIGH (ref 70–99)
Glucose-Capillary: 159 mg/dL — ABNORMAL HIGH (ref 70–99)

## 2020-03-18 LAB — CBC
HCT: 43.4 % (ref 39.0–52.0)
Hemoglobin: 13.8 g/dL (ref 13.0–17.0)
MCH: 30.5 pg (ref 26.0–34.0)
MCHC: 31.8 g/dL (ref 30.0–36.0)
MCV: 95.8 fL (ref 80.0–100.0)
Platelets: 275 10*3/uL (ref 150–400)
RBC: 4.53 MIL/uL (ref 4.22–5.81)
RDW: 13.2 % (ref 11.5–15.5)
WBC: 8.2 10*3/uL (ref 4.0–10.5)
nRBC: 0 % (ref 0.0–0.2)

## 2020-03-18 LAB — BASIC METABOLIC PANEL
Anion gap: 11 (ref 5–15)
BUN: 40 mg/dL — ABNORMAL HIGH (ref 8–23)
CO2: 25 mmol/L (ref 22–32)
Calcium: 8.2 mg/dL — ABNORMAL LOW (ref 8.9–10.3)
Chloride: 102 mmol/L (ref 98–111)
Creatinine, Ser: 1.9 mg/dL — ABNORMAL HIGH (ref 0.61–1.24)
GFR, Estimated: 39 mL/min — ABNORMAL LOW (ref >60)
Glucose, Bld: 117 mg/dL — ABNORMAL HIGH (ref 70–99)
Potassium: 3.5 mmol/L (ref 3.5–5.1)
Sodium: 138 mmol/L (ref 135–145)

## 2020-03-18 MED ORDER — CARVEDILOL 25 MG PO TABS
37.5000 mg | ORAL_TABLET | Freq: Two times a day (BID) | ORAL | Status: DC
  Administered 2020-03-18 – 2020-03-29 (×22): 37.5 mg via ORAL
  Filled 2020-03-18 (×22): qty 1

## 2020-03-18 MED ORDER — MAGNESIUM OXIDE 400 (241.3 MG) MG PO TABS
400.0000 mg | ORAL_TABLET | Freq: Every day | ORAL | Status: AC
  Administered 2020-03-18 – 2020-03-22 (×5): 400 mg via ORAL
  Filled 2020-03-18 (×5): qty 1

## 2020-03-18 MED ORDER — POTASSIUM CHLORIDE CRYS ER 20 MEQ PO TBCR
40.0000 meq | EXTENDED_RELEASE_TABLET | Freq: Every day | ORAL | Status: AC
  Administered 2020-03-18 – 2020-03-21 (×4): 40 meq via ORAL
  Filled 2020-03-18 (×4): qty 2

## 2020-03-18 NOTE — Progress Notes (Signed)
PROGRESS NOTE  Trevor Anderson PPH:[id number] DOB: [birth year] DOA: 03/11/2020 PCP: [name], MD  HPI/Recap of past 24 hours: This 64 years old male with PMH of DMII,CAD(cath 2009 nonobstructive disease), systolic and diastolic CHF(EF 92%),[id number], hypertension and medical noncompliance presents in the emergency department withcomplaints of acute shortness of breath. He reports shortness of breath is progressivelygetting worse. Patient also reports fall from his chair while watching TV resulting in right hip pain,worse with weightbearing. Patient was found to be tachycardic and tachypneic, after an evaluation.Patient was started on intravenous heparin with a concern for possible PE. VQ scan is ordered to rule out PE due to worsening renal functions. Patient was admitted for acute on chronic CHF exacerbation, suspected PE, hypertensive urgency, right hip pain. VQ scan negative, heparin discontinued. Patient was found to have right-sided weakness, slurring of his speech and facial droop during morning round. Code stroke was called.Patient was not deemed a candidate for TPA. Exact time of symptoms unknown. CT head without abnormality in brain parenchyma. Patient was seen by neurology recommended MRI of the brain,PT/OT consult speech consult. Echocardiogram.  MRI moderate size left basal ganglia stroke.  Cardiology consulted for TEE .PT recommended CIR.  03/18/20: Seen and examined.  Feeding himself using his left hand.  Aphasia appears to be improving.  His right-sided weakness is significant with 0 out of 5 strength.  He has no new complaints at this time.  Seen by neurology/stroke team, signed off recommended follow-up at outpatient stroke clinic.  Assessment/Plan: Principal Problem:   Acute on chronic systolic CHF (congestive heart failure) (HCC) Active Problems:   Type 2 diabetes mellitus without complication, with long-term current use of insulin (HCC)   Mixed  hyperlipidemia   Coronary artery disease involving native coronary artery of native heart without angina pectoris   Hypertensive urgency   AKI (acute kidney injury) (HCC)   Fall at home, initial encounter   Right hip pain   Hypokalemia   Cerebral thrombosis with cerebral infarction   Cerebrovascular accident (CVA) (HCC)  Right-sided weakness secondary to bicerebral infarcts with a large left basal ganglia infarct with resultant right hemiplegia with 0 out of 5 strength. Patient is found to have right-sided weakness in morning round 03/12/20. Code Stroke called,  neurology/stroke team evaluated the patient  CTA Head/ Neck : Negative for large vessel occlusion, and no core infarct or ischemic penumbra detected by CT Perfusion. Advised stroke pathway.  MRI: Moderate size acute infarction involving the left basal ganglia and adjacent white matter. Neurology recommended dual antiplatelet therapy for now (aspirin and Plavix) he was on aspirin 81 mg daily prior to admission.  Now he is on aspirin 81 mg daily and Plavix 75 mg daily. Speech therapist following recommendation for dysphagia 3, mechanical soft, thin liquid diet. TEE 03-16-20, no embolic source. Small ASD/PFO without right to left shunting.  Neurology stroke team recommended bilateral lower extremity Doppler ultrasound to rule out DVT.  Follow results. PT OT recommended CIR with 24-hour supervision.  Acute on chronic systolic CHF (congestive heart failure) (HCC)  Patient presented with 2-week history of paroxysmal nocturnal dyspnea and increasing abdominal girth with 1 week history of progressively worsening shortness of breath.  BNP of 705.  Patient has been noncompliant with his antihypertensives likely precipitating acute flare.  Patient was started on Lasix 40 mg IV twice daily.  Strict input and output monitoring  Monitoring renal function and  electrolytes  Monitoring patient on telemetry  Echocardiogram LVEF 45 to  50% ,  left ventricle has decreased function.  Global hypokinesis       V Q scan negative, heparin discontinued. Continue strict I's and O's and daily Net I&O -5.4 L Continue aspirin, Coreg 25 mg twice daily, Plavix 75 mg daily, IV Lasix 40 mg twice daily, Zocor 40 mg daily.  Resolved post repletion: Hypokalemia likely secondary to diuretics Serum potassium 3.5 from 3.4, magnesium 2.1 Currently on IV Lasix 40 mg twice daily Continue daily potassium replacement. Repeat BMP in the a.m.  AKI Baseline creatinine was 1.1 in 2019 Presented with creatinine of greater than 2.3 Currently creatinine 1.90 from 2.0 Continue to avoid nephrotoxins and hypotension. Monitor urine output Repeat BMP in the morning  Resolved hypertensive urgency BP is currently not at goal, increase Coreg to 37.5 mg twice daily  Patient presented with hypertensive urgency with blood pressures as high as 213/151.   Continue IV Lasix 40 mg twice daily.  Continue to monitor vital signs.  Fall at home, initial encounter  Patient reports suffering a fall at home earlier in the day on 11/18.  Patient complains of right hip pain .  X-ray of the right hip reveals a possible femur fracture-however upon discussion of these images between the emergency department provider and [name] surgery disagrees with this read and recommends more advanced imaging.   CT hip shows unremarkable,  no fracture noted.  Type 2 diabetes mellitus without complication, with long-term current use of insulin (HCC)  Hemoglobin A1c : 6.8  Continue to hold off home oral hypoglycemics  Continue insulin sliding scale  Coronary artery disease involving native coronary artery of native heart without angina pectoris  Continue aspirin, Plavix and statin.    Patient denies any anginal symptoms at the time of this visit.  Troponin S peaked at 129 and trended down  No evidence of acute ischemia on twelve-lead  EKG.  Mixed hyperlipidemia LDL is 173 on 03/12/2020 not at goal>> goal less than 70. Continue Zocor 40 mg daily  Obstructive sleep apnea Patient does have brief periods of apnea during sleep when his respiratory rate goes down to 8. Patient declined participation in the sleep smart study on 03/15/2020 per neurology/stroke team.  Ambulatory dysfunction status post acute CVA PT recommended CIR.  TOC assisting with placement at CIR at outside facility.    DVT prophylaxis: SCDs Code Status: Full Family Communication:  None at bedside.  Disposition Plan:  Status is: Inpatient  Remains inpatient appropriate because:Inpatient level of care appropriate due to severity of illness   Dispo: The patient is from: Home  Anticipated d/c is to:  CIR  Anticipated d/c date is: 03/22/20, pending CIR placement.  Patient currently is not medically stable to d/c, ongoing diuresing.   Consultants:   Neurology  Procedures:  Antimicrobials:     Anti-infectives (From admission, onward)       Objective: Vitals:   03/17/20 1942 03/17/20 2332 03/18/20 0420 03/18/20 0800  BP: 128/87 (!) 152/112  (!) 157/106  Pulse: 81 85  74  Resp: 20 20 (!) 21 20  Temp: 98 F (36.7 C) 98.2 F (36.8 C) 98.7 F (37.1 C) 98.3 F (36.8 C)  TempSrc:  Oral Oral Oral  SpO2:    99%  Weight:   112.2 kg   Height:        Intake/Output Summary (Last 24 hours) at 03/18/2020 1318 Last data filed at 03/18/2020 1300 Gross per 24 hour  Intake 720 ml  Output 1375 ml  Net -655 ml  Filed Weights   03/16/20 0358 03/17/20 0647 03/18/20 0420  Weight: 108.5 kg 110.4 kg 112.2 kg    Exam:  . General: 64 y.o. year-old male well-developed well-nourished no distress.  Alert and interactive.  Aphasia appears to be improving. . Cardiovascular: Regular rate and rhythm no rubs or gallops.   Marland Kitchen Respiratory: Clear consultation no wheezes or rales.  . Abdomen: Obese  nontender normal bowel sounds present.  . Musculoskeletal: No lower extremity edema bilaterally.   Marland Kitchen Psychiatry: Mood is appropriate for condition and setting.   Data Reviewed: CBC: Recent Labs  Lab 03/14/20 0217 03/15/20 0117 03/16/20 0050 03/17/20 0019 03/18/20 0119  WBC 8.8 7.5 8.0 7.6 8.2  HGB 13.6 13.9 14.3 14.3 13.8  HCT 42.3 43.5 44.3 44.7 43.4  MCV 96.4 95.4 95.9 95.3 95.8  PLT 297 280 281 234 275   Basic Metabolic Panel: Recent Labs  Lab 03/11/20 1854 03/12/20 0217 03/13/20 0129 03/13/20 0129 03/14/20 0217 03/15/20 0117 03/16/20 0050 03/17/20 0019 03/18/20 0119  NA   < >  --  140   < > 140 142 141 141 138  K   < >  --  3.4*   < > 3.2* 3.2* 3.8 3.4* 3.5  CL   < >  --  103   < > 105 106 106 103 102  CO2   < >  --  23   < > 24 25 21* 25 25  GLUCOSE   < >  --  108*   < > 123* 98 134* 103* 117*  BUN   < >  --  31*   < > 35* 36* 38* 38* 40*  CREATININE   < >  --  2.38*   < > 2.16* 2.09* 2.32* 2.08* 1.90*  CALCIUM   < >  --  8.5*   < > 8.4* 8.5* 8.5* 8.5* 8.2*  MG  --  1.8 2.0  --  2.1  --  2.0 2.1  --   PHOS  --   --  5.4*  --  4.6  --  4.6 4.8*  --    < > = values in this interval not displayed.   GFR: Estimated Creatinine Clearance: 51.4 mL/min (A) (by C-G formula based on SCr of 1.9 mg/dL (H)). Liver Function Tests: Recent Labs  Lab 03/16/20 0050 03/17/20 0019  AST 30 34  ALT 22 26  ALKPHOS 55 58  BILITOT 1.5* 0.9  PROT 6.8 6.6  ALBUMIN 3.0* 2.9*   No results for input(s): LIPASE, AMYLASE in the last 168 hours. No results for input(s): AMMONIA in the last 168 hours. Coagulation Profile: No results for input(s): INR, PROTIME in the last 168 hours. Cardiac Enzymes: No results for input(s): CKTOTAL, CKMB, CKMBINDEX, TROPONINI in the last 168 hours. BNP (last 3 results) No results for input(s): PROBNP in the last 8760 hours. HbA1C: No results for input(s): HGBA1C in the last 72 hours. CBG: Recent Labs  Lab 03/17/20 1146 03/17/20 1705  03/17/20 2107 03/18/20 0556 03/18/20 1203  GLUCAP 150* 116* 197* 112* 123*   Lipid Profile: No results for input(s): CHOL, HDL, LDLCALC, TRIG, CHOLHDL, LDLDIRECT in the last 72 hours. Thyroid Function Tests: No results for input(s): TSH, T4TOTAL, FREET4, T3FREE, THYROIDAB in the last 72 hours. Anemia Panel: No results for input(s): VITAMINB12, FOLATE, FERRITIN, TIBC, IRON, RETICCTPCT in the last 72 hours. Urine analysis:    Component Value Date/Time   COLORURINE STRAW (A) 03/12/2020 0444   APPEARANCEUR  CLEAR 03/12/2020 0444   LABSPEC 1.008 03/12/2020 0444   PHURINE 7.0 03/12/2020 0444   GLUCOSEU NEGATIVE 03/12/2020 0444   GLUCOSEU NEGATIVE 05/03/2017 1231   HGBUR MODERATE (A) 03/12/2020 0444   BILIRUBINUR NEGATIVE 03/12/2020 0444   KETONESUR 5 (A) 03/12/2020 0444   PROTEINUR 100 (A) 03/12/2020 0444   UROBILINOGEN 0.2 05/03/2017 1231   NITRITE NEGATIVE 03/12/2020 0444   LEUKOCYTESUR NEGATIVE 03/12/2020 0444   Sepsis Labs: @LABRCNTIP (procalcitonin:4,lacticidven:4)  ) Recent Results (from the past 240 hour(s))  Respiratory Panel by RT PCR (Flu A&B, Covid) - Nasopharyngeal Swab     Status: None   Collection Time: 03/11/20  6:15 PM   Specimen: Nasopharyngeal Swab; Nasopharyngeal(NP) swabs in vial transport medium  Result Value Ref Range Status   SARS Coronavirus 2 by RT PCR NEGATIVE NEGATIVE Final    Comment: (NOTE) SARS-CoV-2 target nucleic acids are NOT DETECTED.  The SARS-CoV-2 RNA is generally detectable in upper respiratoy specimens during the acute phase of infection. The lowest concentration of SARS-CoV-2 viral copies this assay can detect is 131 copies/mL. A negative result does not preclude SARS-Cov-2 infection and should not be used as the sole basis for treatment or other patient management decisions. A negative result may occur with  improper specimen collection/handling, submission of specimen other than nasopharyngeal swab, presence of viral mutation(s)  within the areas targeted by this assay, and inadequate number of viral copies (<131 copies/mL). A negative result must be combined with clinical observations, patient history, and epidemiological information. The expected result is Negative.  Fact Sheet for Patients:  03/13/20  Fact Sheet for Healthcare Providers:  https://www.moore.com/  This test is no t yet approved or cleared by the https://www.young.biz/ FDA and  has been authorized for detection and/or diagnosis of SARS-CoV-2 by FDA under an Emergency Use Authorization (EUA). This EUA will remain  in effect (meaning this test can be used) for the duration of the COVID-19 declaration under Section 564(b)(1) of the Act, 21 U.S.C. section 360bbb-3(b)(1), unless the authorization is terminated or revoked sooner.     Influenza A by PCR NEGATIVE NEGATIVE Final   Influenza B by PCR NEGATIVE NEGATIVE Final    Comment: (NOTE) The Xpert Xpress SARS-CoV-2/FLU/RSV assay is intended as an aid in  the diagnosis of influenza from Nasopharyngeal swab specimens and  should not be used as a sole basis for treatment. Nasal washings and  aspirates are unacceptable for Xpert Xpress SARS-CoV-2/FLU/RSV  testing.  Fact Sheet for Patients: [place]  Fact Sheet for Healthcare Providers: https://www.moore.com/  This test is not yet approved or cleared by the https://www.young.biz/ FDA and  has been authorized for detection and/or diagnosis of SARS-CoV-2 by  FDA under an Emergency Use Authorization (EUA). This EUA will remain  in effect (meaning this test can be used) for the duration of the  Covid-19 declaration under Section 564(b)(1) of the Act, 21  U.S.C. section 360bbb-3(b)(1), unless the authorization is  terminated or revoked. Performed at [facility], [address] [place], [place]       Studies: No results found.  Scheduled  Meds: . aspirin EC  81 mg Oral Daily  . carvedilol  25 mg Oral BID WC  . clopidogrel  75 mg Oral Daily  . furosemide  40 mg Intravenous BID  . insulin aspart  0-15 Units Subcutaneous TID AC & HS  . magnesium oxide  400 mg Oral Daily  . potassium chloride  40 mEq Oral Daily  . simvastatin  40 mg Oral Daily  . sodium chloride flush  3 mL Intravenous Q12H    Continuous Infusions: . sodium chloride       LOS: 6 days     Darlin Drop, MD [facility] Pager [phone]  If 7PM-7AM, please contact night-coverage [email or web address] Password [email or web address] 03/18/2020, 1:18 PM

## 2020-03-18 NOTE — Progress Notes (Signed)
STROKE TEAM PROGRESS NOTE    INTERVAL HISTORY Patient is sitting up in bed.  His speech appears improved today with less dysarthria.  Continues to have dense right hemiplegia.  TEE shows no cardiac source of embolism.  Small PFO.  Vital signs stable.  No new neurological changes.  Lower extremity venous Dopplers ordered but not yet done  OBJECTIVE Vitals:   03/17/20 1942 03/17/20 2332 03/18/20 0420 03/18/20 0800  BP: 128/87 (!) 152/112  (!) 157/106  Pulse: 81 85  74  Resp: 20 20 (!) 21 20  Temp: 98 F (36.7 C) 98.2 F (36.8 C) 98.7 F (37.1 C) 98.3 F (36.8 C)  TempSrc:  Oral Oral Oral  SpO2:    99%  Weight:   112.2 kg   Height:       CBC:  Recent Labs  Lab 03/17/20 0019 03/18/20 0119  WBC 7.6 8.2  HGB 14.3 13.8  HCT 44.7 43.4  MCV 95.3 95.8  PLT 234 275   Basic Metabolic Panel:  Recent Labs  Lab 03/16/20 0050 03/16/20 0050 03/17/20 0019 03/18/20 0119  NA 141   < > 141 138  K 3.8   < > 3.4* 3.5  CL 106   < > 103 102  CO2 21*   < > 25 25  GLUCOSE 134*   < > 103* 117*  BUN 38*   < > 38* 40*  CREATININE 2.32*   < > 2.08* 1.90*  CALCIUM 8.5*   < > 8.5* 8.2*  MG 2.0  --  2.1  --   PHOS 4.6  --  4.8*  --    < > = values in this interval not displayed.   Lipid Panel:     Component Value Date/Time   CHOL 232 (H) 03/12/2020 0217   TRIG 67 03/12/2020 0217   HDL 46 03/12/2020 0217   CHOLHDL 5.0 03/12/2020 0217   VLDL 13 03/12/2020 0217   LDLCALC 173 (H) 03/12/2020 0217   HgbA1c:  Lab Results  Component Value Date   HGBA1C 6.8 (H) 03/12/2020   Urine Drug Screen:     Component Value Date/Time   LABOPIA NONE DETECTED 08/21/2007 1200   COCAINSCRNUR NONE DETECTED 08/21/2007 1200   LABBENZ NONE DETECTED 08/21/2007 1200   AMPHETMU NONE DETECTED 08/21/2007 1200   THCU NONE DETECTED 08/21/2007 1200   LABBARB  08/21/2007 1200    NONE DETECTED        DRUG SCREEN FOR MEDICAL PURPOSES ONLY.  IF CONFIRMATION IS NEEDED FOR ANY PURPOSE, NOTIFY LAB WITHIN 5 DAYS.     Alcohol Level No results found for: ETH  IMAGING No results found.  PHYSICAL EXAM    Blood pressure (!) 157/106, pulse 74, temperature 98.3 F (36.8 C), temperature source Oral, resp. rate 20, height 5\' 11"  (1.803 m), weight 112.2 kg, SpO2 99 %. GENERAL: Obese middle-aged African-American male not in distress HEENT: Supple no trauma noted  ABDOMEN: soft  EXTREMITIES: No edema   BACK: Normal  SKIN: Normal by inspection.    MENTAL STATUS: He is awake and responsive and cooperates with evaluation.  He has moderate dysarthria.  No clear aphasia noted.  CRANIAL NERVES: Pupils are equal, round and reactive to light and accomodation; extra ocular movements are full, there is no significant nystagmus; visual fields are full;  there is plegia of the right lower facial muscles; tongue deviates to the right; uvula is midline  MOTOR: Flaccid right hemiplegia.  0/5 strength.  Left side shows  normal tone, bulk and strength.  COORDINATION: No dysmetria, tremor or parkinsonism noted.  No myoclonus.  SENSATION: Normal to light touch, temperature, and pain.  NIH stroke scale 12.   ASSESSMENT/PLAN [name] is a 64 y.o. male with history of obesity, DM2, CAD, systolic and diastolic CHF, HLD, HTN and medical noncompliance who presented to the [facility] ED 11/17 with a 2 week history of orthopnea, SOB, increased abdominal girth, and a mild nonproductive cough. On the morning of 11/18 the RN noted that the patient seemed to have significant right sided weakness. He was later found to have a prominent right facial droop and dysarthria.  NIHSS was 10. Code Stroke was called. He did not receive IV t-PA due to being out of the therapeutic window for treatment.  Stroke:  Bicerebral infarcts with a large left basal ganglia infarct with resultant right hemiplegia-etiology likely cryptogenic even though most infarcts are subcortical  Resultant moderate to severe dysarthria, right hemiplegia, right lower  facial weakness and tongue deviation to the right.  Code Stroke CT Head - Hyperdense Left MCA branch in the left Sylvian fissure suspicious for emergent large vessel occlusion. No acute cortically based infarct or acute intracranial hemorrhage identified. ASPECTS 10. Evidence of advanced underlying small vessel disease.  MRI head -moderate large left basal ganglia infarct as well as small right subcortical and left cerebellar white matter infarcts  CTA H&N - Mild for age extracranial but fairly advanced intracranial atherosclerosis. Up to moderate circle-of-Willis branch irregularity and stenoses in the bilateral MCAs (M2 and M3), left PCA (P2), and distal ACAs   CT Perfusion - no core infarct or ischemic penumbra detected by CT Perfusion.   2D Echo - EF 45 - 50%. No cardiac source of emboli identified.   TEE - tiny ASD/PFO, neg bubble R to L flow.  Diminished EF 45 to 50%  LE doppler pending   [facility] electrophysiologist will consult and consider placement of an implantable loop recorder to evaluate for atrial fibrillation as etiology of stroke. This has been explained to patient/family by Dr. Pearlean Brownie and they are agreeable.   [employer] Virus 2 - negative  LDL - 173  HgbA1c - 6.8  UDS - not ordered  VTE prophylaxis - added SCDs  aspirin 81 mg daily prior to admission, now on aspirin 81 mg daily and clopidogrel 75 mg daily  Therapy recommendations:  Skilled Nursing Facility vs CIR  Disposition:  Pending  Hypertension  Hypertensive Urgency, resolved  Home BP meds: Coreg ; Norvasc ; Lisinopril ; hydralazine  Stable . Permissive hypertension (OK if < 220/120) but gradually normalize in 5-7 days  . Long-term BP goal normotensive  Hyperlipidemia  Home Lipid lowering medication: Zocor 40 mg daily  LDL 173, goal < 70  Current lipid lowering medication: Zocor 40 mg daily  Continue statin at discharge  Diabetes type II  Home diabetic meds:  insulin ; metformin  HgbA1c 6.8, goal < 7.0  Other Stroke Risk Factors  Advanced age  ETOH use, advised to drink no more than 1 alcoholic beverage per day.  Obesity, Body mass index is 34.5 kg/m., recommend weight loss, diet and exercise as appropriate   Hx of medical non compliance  Coronary artery disease  Acute on chronic systolic Congestive Heart Failure  obstructive sleep apnea, declined SleepSmart participation  Other Active Problems Hypokalemia - potassium - 3.4  AKI - creatinine - 2.08 Fall at home  Hospital day # 6 Check lower  extremity venous Dopplers for DVT.Marland Kitchen  Continue dual antiplatelet therapy.  Stroke team will sign off.  Follow-up as outpatient stroke clinic. Delia Heady, MD  To contact Stroke Continuity provider, please refer to [email or web address]. After hours, contact General Neurology

## 2020-03-18 NOTE — Progress Notes (Signed)
Bilateral lower extremity venous duplex completed. Refer to "CV Proc" under chart review to view preliminary results.  03/18/2020 3:28 PM Eula Fried., MHA, RVT, RDCS, RDMS

## 2020-03-18 NOTE — Plan of Care (Signed)
Progressing, will continue to monitor.  

## 2020-03-19 ENCOUNTER — Encounter (HOSPITAL_COMMUNITY)
Admission: EM | Disposition: A | Discharge status: Home / Self Care | Payer: Self-pay | Source: Home / Self Care | Attending: Internal Medicine

## 2020-03-19 LAB — BASIC METABOLIC PANEL
Anion gap: 10 (ref 5–15)
BUN: 38 mg/dL — ABNORMAL HIGH (ref 8–23)
CO2: 27 mmol/L (ref 22–32)
Calcium: 8.5 mg/dL — ABNORMAL LOW (ref 8.9–10.3)
Chloride: 102 mmol/L (ref 98–111)
Creatinine, Ser: 2.01 mg/dL — ABNORMAL HIGH (ref 0.61–1.24)
GFR, Estimated: 37 mL/min — ABNORMAL LOW (ref >60)
Glucose, Bld: 90 mg/dL (ref 70–99)
Potassium: 3.7 mmol/L (ref 3.5–5.1)
Sodium: 139 mmol/L (ref 135–145)

## 2020-03-19 LAB — GLUCOSE, CAPILLARY
Glucose-Capillary: 125 mg/dL — ABNORMAL HIGH (ref 70–99)
Glucose-Capillary: 134 mg/dL — ABNORMAL HIGH (ref 70–99)
Glucose-Capillary: 102 mg/dL — ABNORMAL HIGH (ref 70–99)
Glucose-Capillary: 125 mg/dL — ABNORMAL HIGH (ref 70–99)

## 2020-03-19 LAB — MAGNESIUM: Magnesium: 2.2 mg/dL (ref 1.7–2.4)

## 2020-03-19 SURGERY — ECHOCARDIOGRAM, TRANSESOPHAGEAL
Anesthesia: Monitor Anesthesia Care

## 2020-03-19 MED ORDER — QUETIAPINE FUMARATE 25 MG PO TABS
12.5000 mg | ORAL_TABLET | Freq: Every day | ORAL | Status: DC
  Administered 2020-03-19 – 2020-03-28 (×10): 12.5 mg via ORAL
  Filled 2020-03-19 (×10): qty 1

## 2020-03-19 NOTE — Progress Notes (Signed)
PROGRESS NOTE  Trevor Anderson JSE:[id number] DOB: 1956/02/16 DOA: 03/11/2020 PCP: [name], MD  HPI/Recap of past 24 hours: This 64 years old male with PMH of DMII,CAD(cath 2009 nonobstructive disease), systolic and diastolic CHF(EF 07%),[id number], hypertension and medical noncompliance presents in the emergency department withcomplaints of acute shortness of breath. He reports shortness of breath is progressivelygetting worse. Patient also reports fall from his chair while watching TV resulting in right hip pain,worse with weightbearing. Patient was found to be tachycardic and tachypneic, after an evaluation.Patient was started on intravenous heparin with a concern for possible PE. VQ scan is ordered to rule out PE due to worsening renal functions. Patient was admitted for acute on chronic CHF exacerbation, suspected PE, hypertensive urgency, right hip pain. VQ scan negative, heparin discontinued. Patient was found to have right-sided weakness, slurring of his speech and facial droop during morning round. Code stroke was called.Patient was not deemed a candidate for TPA. Exact time of symptoms unknown. CT head without abnormality in brain parenchyma. Patient was seen by neurology recommended MRI of the brain,PT/OT consult speech consult. Echocardiogram.  MRI moderate size left basal ganglia stroke.  Cardiology consulted for TEE .PT recommended CIR.  03/19/20: No new complaints.  Pending outside facility inpatient rehab placement.  Assessment/Plan: Principal Problem:   Acute on chronic systolic CHF (congestive heart failure) (HCC) Active Problems:   Type 2 diabetes mellitus without complication, with long-term current use of insulin (HCC)   Mixed hyperlipidemia   Coronary artery disease involving native coronary artery of native heart without angina pectoris   Hypertensive urgency   AKI (acute kidney injury) (HCC)   Fall at home, initial encounter   Right hip pain    Hypokalemia   Cerebral thrombosis with cerebral infarction   Cerebrovascular accident (CVA) (HCC)  Right-sided weakness secondary to bicerebral infarcts with a large left basal ganglia infarct with resultant right hemiplegia with 0 out of 5 strength. Patient is found to have right-sided weakness in morning round 03/12/20. Code Stroke called,  neurology/stroke team evaluated the patient  CTA Head/ Neck : Negative for large vessel occlusion, and no core infarct or ischemic penumbra detected by CT Perfusion. Advised stroke pathway.  MRI: Moderate size acute infarction involving the left basal ganglia and adjacent white matter. Neurology recommended dual antiplatelet therapy for now (aspirin and Plavix) he was on aspirin 81 mg daily prior to admission.  Now he is on aspirin 81 mg daily and Plavix 75 mg daily. Speech therapist following recommendation for dysphagia 3, mechanical soft, thin liquid diet. TEE 03-16-20, no embolic source. Small ASD/PFO without right to left shunting.  Neurology stroke team recommended bilateral lower extremity Doppler ultrasound to rule out DVT.  Follow results. PT OT recommended CIR with 24-hour supervision.  Acute on chronic systolic CHF (congestive heart failure) (HCC)  Patient presented with 2-week history of paroxysmal nocturnal dyspnea and increasing abdominal girth with 1 week history of progressively worsening shortness of breath.  BNP of 705.  Patient has been noncompliant with his antihypertensives likely precipitating acute flare.  Patient was started on Lasix 40 mg IV twice daily.  Strict input and output monitoring  Monitoring renal function and  electrolytes  Monitoring patient on telemetry  Echocardiogram LVEF 45 to 50% , left ventricle has decreased function.  Global hypokinesis       V Q scan negative, heparin discontinued. Continue strict I's and O's and daily Net I&O -5.4 L Continue aspirin, Coreg 25 mg twice daily, Plavix 75  mg daily,  IV Lasix 40 mg twice daily, Zocor 40 mg daily.  Resolved post repletion: Hypokalemia likely secondary to diuretics Serum potassium 3.5 from 3.4, magnesium 2.1 Currently on IV Lasix 40 mg twice daily Continue daily potassium replacement. Repeat BMP in the a.m.  AKI Baseline creatinine was 1.1 in 2019 Presented with creatinine of greater than 2.3 Currently creatinine 1.90 from 2.0 Continue to avoid nephrotoxins and hypotension. Monitor urine output Repeat BMP in the morning  Resolved hypertensive urgency BP is currently not at goal, increase Coreg to 37.5 mg twice daily  Patient presented with hypertensive urgency with blood pressures as high as 213/151.   Continue IV Lasix 40 mg twice daily.  Continue to monitor vital signs.  Fall at home, initial encounter  Patient reports suffering a fall at home earlier in the day on 11/18.  Patient complains of right hip pain .  X-ray of the right hip reveals a possible femur fracture-however upon discussion of these images between the emergency department provider and [name] surgery disagrees with this read and recommends more advanced imaging.   CT hip shows unremarkable,  no fracture noted.  Type 2 diabetes mellitus without complication, with long-term current use of insulin (HCC)  Hemoglobin A1c : 6.8  Continue to hold off home oral hypoglycemics  Continue insulin sliding scale  Coronary artery disease involving native coronary artery of native heart without angina pectoris  Continue aspirin, Plavix and statin.    Patient denies any anginal symptoms at the time of this visit.  Troponin S peaked at 129 and trended down  No evidence of acute ischemia on twelve-lead EKG.  Mixed hyperlipidemia LDL is 173 on 03/12/2020 not at goal>> goal less than 70. Continue Zocor 40 mg daily  Obstructive sleep apnea Patient does have brief periods of apnea during sleep when his respiratory rate goes down to  8. Patient declined participation in the sleep smart study on 03/15/2020 per neurology/stroke team.  Ambulatory dysfunction status post acute CVA PT recommended CIR.  TOC assisting with placement at CIR at outside facility.    DVT prophylaxis: SCDs Code Status: Full Family Communication:  None at bedside.  Disposition Plan:  Status is: Inpatient  Remains inpatient appropriate because:Inpatient level of care appropriate due to severity of illness   Dispo: The patient is from: Home  Anticipated d/c is to:  CIR  Anticipated d/c date is: 03/20/20, pending CIR placement.  Patient currently is medically stable to d/c.  Consultants:   Neurology  Procedures:  Antimicrobials:     Anti-infectives (From admission, onward)       Objective: Vitals:   03/19/20 0440 03/19/20 0449 03/19/20 0833 03/19/20 1147  BP:  (!) 155/108  (!) 129/100  Pulse:    80  Resp:  Temp:  (!) 97.5 F (36.4 C) 97.6 F (36.4 C) 98.6 F (37 C)  TempSrc:  Oral Oral Oral  SpO2:  95% 95% 98%  Weight: 110 kg     Height:        Intake/Output Summary (Last 24 hours) at 03/19/2020 1425 Last data filed at 03/19/2020 0850 Gross per 24 hour  Intake 245 ml  Output --  Net 245 ml   Filed Weights   03/17/20 0647 03/18/20 0420 03/19/20 0440  Weight: 110.4 kg 112.2 kg 110 kg    Exam: No significant changes from prior exam.  . General: 64 y.o. year-old male well-developed well-nourished no distress.  Alert and interactive.  Aphasia appears  to be improving. . Cardiovascular: Regular rate and rhythm no rubs or gallops.   Marland Kitchen Respiratory: Clear consultation no wheezes or rales.  . Abdomen: Obese nontender normal bowel sounds present.  . Musculoskeletal: No lower extremity edema bilaterally.   Marland Kitchen Psychiatry: Mood is appropriate for condition and setting.   Data Reviewed: CBC: Recent Labs  Lab 03/14/20 0217 03/15/20 0117 03/16/20 0050  03/17/20 0019 03/18/20 0119  WBC 8.8 7.5 8.0 7.6 8.2  HGB 13.6 13.9 14.3 14.3 13.8  HCT 42.3 43.5 44.3 44.7 43.4  MCV 96.4 95.4 95.9 95.3 95.8  PLT 297 280 281 234 275   Basic Metabolic Panel: Recent Labs  Lab 03/13/20 0129 03/13/20 0129 03/14/20 0217 03/14/20 0217 03/15/20 0117 03/16/20 0050 03/17/20 0019 03/18/20 0119 03/19/20 0126  NA 140   < > 140   < > 142 141 141 138 139  K 3.4*   < > 3.2*   < > 3.2* 3.8 3.4* 3.5 3.7  CL 103   < > 105   < > 106 106 103 102 102  CO2 23   < > 24   < > 25 21* GLUCOSE 108*   < > 123*   < > 98 134* 103* 117* 90  BUN 31*   < > 35*   < > 36* 38* 38* 40* 38*  CREATININE 2.38*   < > 2.16*   < > 2.09* 2.32* 2.08* 1.90* 2.01*  CALCIUM 8.5*   < > 8.4*   < > 8.5* 8.5* 8.5* 8.2* 8.5*  MG 2.0  --  2.1  --   --  2.0 2.1  --  2.2  PHOS 5.4*  --  4.6  --   --  4.6 4.8*  --   --    < > = values in this interval not displayed.   GFR: Estimated Creatinine Clearance: 48.1 mL/min (A) (by C-G formula based on SCr of 2.01 mg/dL (H)). Liver Function Tests: Recent Labs  Lab 03/16/20 0050 03/17/20 0019  AST 30 34  ALT 22 26  ALKPHOS 55 58  BILITOT 1.5* 0.9  PROT 6.8 6.6  ALBUMIN 3.0* 2.9*   No results for input(s): LIPASE, AMYLASE in the last 168 hours. No results for input(s): AMMONIA in the last 168 hours. Coagulation Profile: No results for input(s): INR, PROTIME in the last 168 hours. Cardiac Enzymes: No results for input(s): CKTOTAL, CKMB, CKMBINDEX, TROPONINI in the last 168 hours. BNP (last 3 results) No results for input(s): PROBNP in the last 8760 hours. HbA1C: No results for input(s): HGBA1C in the last 72 hours. CBG: Recent Labs  Lab 03/18/20 1203 03/18/20 1624 03/18/20 2137 03/19/20 0612 03/19/20 1145  GLUCAP 123* 108* 159* 125* 134*   Lipid Profile: No results for input(s): CHOL, HDL, LDLCALC, TRIG, CHOLHDL, LDLDIRECT in the last 72 hours. Thyroid Function Tests: No results for input(s): TSH, T4TOTAL, FREET4,  T3FREE, THYROIDAB in the last 72 hours. Anemia Panel: No results for input(s): VITAMINB12, FOLATE, FERRITIN, TIBC, IRON, RETICCTPCT in the last 72 hours. Urine analysis:    Component Value Date/Time   COLORURINE STRAW (A) 03/12/2020 0444   APPEARANCEUR CLEAR 03/12/2020 0444   LABSPEC 1.008 03/12/2020 0444   PHURINE 7.0 03/12/2020 0444   GLUCOSEU NEGATIVE 03/12/2020 0444   GLUCOSEU NEGATIVE 05/03/2017 1231   HGBUR MODERATE (A) 03/12/2020 0444   BILIRUBINUR NEGATIVE 03/12/2020 0444   KETONESUR 5 (A) 03/12/2020 0444   PROTEINUR 100 (A) 03/12/2020 0444  UROBILINOGEN 0.2 05/03/2017 1231   NITRITE NEGATIVE 03/12/2020 0444   LEUKOCYTESUR NEGATIVE 03/12/2020 0444   Sepsis Labs: @LABRCNTIP (procalcitonin:4,lacticidven:4)  ) Recent Results (from the past 240 hour(s))  Respiratory Panel by RT PCR (Flu A&B, Covid) - Nasopharyngeal Swab     Status: None   Collection Time: 03/11/20  6:15 PM   Specimen: Nasopharyngeal Swab; Nasopharyngeal(NP) swabs in vial transport medium  Result Value Ref Range Status   SARS Coronavirus 2 by RT PCR NEGATIVE NEGATIVE Final    Comment: (NOTE) SARS-CoV-2 target nucleic acids are NOT DETECTED.  The SARS-CoV-2 RNA is generally detectable in upper respiratoy specimens during the acute phase of infection. The lowest concentration of SARS-CoV-2 viral copies this assay can detect is 131 copies/mL. A negative result does not preclude SARS-Cov-2 infection and should not be used as the sole basis for treatment or other patient management decisions. A negative result may occur with  improper specimen collection/handling, submission of specimen other than nasopharyngeal swab, presence of viral mutation(s) within the areas targeted by this assay, and inadequate number of viral copies (<131 copies/mL). A negative result must be combined with clinical observations, patient history, and epidemiological information. The expected result is Negative.  Fact Sheet for  Patients:  https://www.moore.com/  Fact Sheet for Healthcare Providers:  https://www.young.biz/  This test is no t yet approved or cleared by the [place] FDA and  has been authorized for detection and/or diagnosis of SARS-CoV-2 by FDA under an Emergency Use Authorization (EUA). This EUA will remain  in effect (meaning this test can be used) for the duration of the COVID-19 declaration under Section 564(b)(1) of the Act, 21 U.S.C. section 360bbb-3(b)(1), unless the authorization is terminated or revoked sooner.     Influenza A by PCR NEGATIVE NEGATIVE Final   Influenza B by PCR NEGATIVE NEGATIVE Final    Comment: (NOTE) The Xpert Xpress SARS-CoV-2/FLU/RSV assay is intended as an aid in  the diagnosis of influenza from Nasopharyngeal swab specimens and  should not be used as a sole basis for treatment. Nasal washings and  aspirates are unacceptable for Xpert Xpress SARS-CoV-2/FLU/RSV  testing.  Fact Sheet for Patients: https://www.moore.com/  Fact Sheet for Healthcare Providers: https://www.young.biz/  This test is not yet approved or cleared by the [place] FDA and  has been authorized for detection and/or diagnosis of SARS-CoV-2 by  FDA under an Emergency Use Authorization (EUA). This EUA will remain  in effect (meaning this test can be used) for the duration of the  Covid-19 declaration under Section 564(b)(1) of the Act, 21  U.S.C. section 360bbb-3(b)(1), unless the authorization is  terminated or revoked. Performed at [facility], [address] [place], [place] [address]       Studies: VAS [place] LOWER EXTREMITY VENOUS (DVT)  Result Date: 03/18/2020  Lower Venous DVT Study Indications: Stroke.  Comparison Study: No prior study Performing Technologist: Gertie Fey MHA, RDMS, RVT, RDCS  Examination Guidelines: A complete evaluation includes B-mode imaging, spectral  Doppler, color Doppler, and power Doppler as needed of all accessible portions of each vessel. Bilateral testing is considered an integral part of a complete examination. Limited examinations for reoccurring indications may be performed as noted. The reflux portion of the exam is performed with the patient in reverse Trendelenburg.  +---------+---------------+---------+-----------+----------+--------------+ RIGHT    CompressibilityPhasicitySpontaneityPropertiesThrombus Aging +---------+---------------+---------+-----------+----------+--------------+ CFV      Full           Yes      Yes  patent                   +---------+---------------+---------+-----------+----------+--------------+ FV Prox  Full                               patent                   +---------+---------------+---------+-----------+----------+--------------+ FV Mid   Full                               patent                   +---------+---------------+---------+-----------+----------+--------------+ FV Distal                        Yes        patent                   +---------+---------------+---------+-----------+----------+--------------+ POP                     Yes      Yes        patent                   +---------+---------------+---------+-----------+----------+--------------+ PTV      Full                               patent                   +---------+---------------+---------+-----------+----------+--------------+ PERO     Full                               patent                   +---------+---------------+---------+-----------+----------+--------------+   Right Technical Findings: Not visualized segments include SFJ, PFV.  +---------+---------------+---------+-----------+----------+--------------+ LEFT     CompressibilityPhasicitySpontaneityPropertiesThrombus Aging +---------+---------------+---------+-----------+----------+--------------+ CFV      Full            Yes      Yes                                 +---------+---------------+---------+-----------+----------+--------------+ SFJ      Full                                                        +---------+---------------+---------+-----------+----------+--------------+ FV Prox  Full                                                        +---------+---------------+---------+-----------+----------+--------------+ FV Mid   Full                                                        +---------+---------------+---------+-----------+----------+--------------+  FV DistalFull                                                        +---------+---------------+---------+-----------+----------+--------------+ PFV      Full                                                        +---------+---------------+---------+-----------+----------+--------------+ POP      Full           Yes      Yes                                 +---------+---------------+---------+-----------+----------+--------------+ PTV      Full                                                        +---------+---------------+---------+-----------+----------+--------------+ PERO     Full                                                        +---------+---------------+---------+-----------+----------+--------------+     Summary: RIGHT: - There is no evidence of deep vein thrombosis in the lower extremity. However, portions of this examination were limited- see technologist comments above.  - No cystic structure found in the popliteal fossa.  LEFT: - There is no evidence of deep vein thrombosis in the lower extremity.  - No cystic structure found in the popliteal fossa.  *See table(s) above for measurements and observations.    Preliminary     Scheduled Meds: . aspirin EC  81 mg Oral Daily  . carvedilol  37.5 mg Oral BID WC  . clopidogrel  75 mg Oral Daily  . furosemide  40 mg Intravenous BID  . insulin  aspart  0-15 Units Subcutaneous TID AC & HS  . magnesium oxide  400 mg Oral Daily  . potassium chloride  40 mEq Oral Daily  . simvastatin  40 mg Oral Daily  . sodium chloride flush  3 mL Intravenous Q12H    Continuous Infusions: . sodium chloride       LOS: 7 days     Darlin Drop, MD [facility] Pager [phone]  If 7PM-7AM, please contact night-coverage [email or web address] Password [email or web address] 03/19/2020, 2:25 PM

## 2020-03-19 NOTE — Progress Notes (Signed)
Mobility Specialist - Progress Note   03/19/20 1357  Mobility  Activity Transferred:  Chair to bed  Level of Assistance +2 (takes two people)  Assistive Device Stedy  Mobility Response Tolerated well  Mobility performed by Mobility specialist;Nurse tech  $Mobility charge 1 Mobility   Pt able to assist w/ standing by using his LUE and LLE. VSS throughout.   Mamie Levers Mobility Specialist Mobility Specialist Phone: [phone]

## 2020-03-19 NOTE — Progress Notes (Addendum)
Physical Therapy Treatment Patient Details Name: JAZMINE LONGSHORE MRN: [id number] DOB: May 20, 1955 Today's Date: 03/19/2020    History of Present Illness 64 year old male with past medical history of diabetes mellitus type 2, coronary artery disease (cath 2009 with nonobstructive disease), systolic and diastolic congestive heart failure, hyperlipidemia, hypertension and medical noncompliance who presented to [facility] emergency department 11/18 with complaints of shortness of breath. Code stroke called am 11/19 due to acute right sided weakness and dysarthria.    PT Comments    Pt more alert this session and speech is improving.  He is following commands better.  Pt performed LE exercise to RLE and UE to B UEs.  Pt continues to require assistance to rise into standing with use of sara stedy as he lack quad control on the R.  Plan for intensive rehab remains most appropriate to continue to improve strength and endurance before returning home.     Follow Up Recommendations  CIR;Supervision/Assistance - 24 hour     Equipment Recommendations  Wheelchair (measurements PT);Wheelchair cushion (measurements PT);3in1 (PT);Hospital bed (lift pad and lift)    Recommendations for Other Services OT consult;Rehab consult     Precautions / Restrictions Precautions Precautions: Fall Restrictions Weight Bearing Restrictions: No RUE Weight Bearing:  (FWB) RLE Weight Bearing:  (FWB) Other Position/Activity Restrictions: R sided hemiparesis.    Mobility  Bed Mobility Overal bed mobility: Needs Assistance Bed Mobility: Rolling;Sidelying to Sit Rolling: Min assist         General bed mobility comments: Min assistance to R to the R side, max assistance to elevate trunk into seated position.  He has difficulty once in sitting to maintain midline.  LOB noted to the R side multiple times.  Transfers Overall transfer level: Needs assistance Equipment used: Ambulation equipment used (sara  stedy) Transfers: Sit to/from Stand Sit to Stand: Mod assist;+2 physical assistance;From elevated surface         General transfer comment: Pt able to follow commands to utilize L side of his body to come into standing.  Stedy blocked R knee and PTA facilitated trunk control and weight bearing to R shoulder and head control.  Emphasis on finding midline.  Ambulation/Gait                 Stairs             Wheelchair Mobility    Modified Rankin (Stroke Patients Only)       Balance Overall balance assessment: Needs assistance Sitting-balance support: Single extremity supported;Feet supported Sitting balance-Leahy Scale: Poor Sitting balance - Comments: initially pt with no sitting balance requiring max A to remain upright, pt pushing wtih LUE posterior and to R. therapist providing cueing to come forward to the L toward therapist, pt able to correct balance and maintain briefly before standing Postural control: Posterior lean;Right lateral lean;Other (comment)   Standing balance-Leahy Scale: Zero Standing balance comment: Heavy reliance on external assistance to facilitate R UE and RLE during standing.                            Cognition Arousal/Alertness: Awake/alert Behavior During Therapy: WFL for tasks assessed/performed Overall Cognitive Status: Difficult to assess Area of Impairment: Following commands;Safety/judgement                       Following Commands: Follows one step commands inconsistently;Follows one step commands with increased time Safety/Judgement: Decreased awareness of safety;Decreased  awareness of deficits     General Comments: Pt is able to string together 3-4 words to say simple sentences.  He is more alert and aware of his environment.      Exercises General Exercises - Lower Extremity Heel Slides: PROM;Right;10 reps;Supine Hip ABduction/ADduction: AAROM;Right;10 reps;Supine;Limitations Hip  Abduction/Adduction Limitations: Able to assist with pull to midline. Other Exercises Other Exercises: hand clasped with chest presses to midline x 10 reps.  AAROM.  Performed x 4 with self assistance tehn required assist from PTA to complete x 6 reps.    General Comments        Pertinent Vitals/Pain Pain Assessment: Faces Faces Pain Scale: Hurts little more Pain Location: R LE and R UE. Pain Descriptors / Indicators: Grimacing;Guarding Pain Intervention(s): Monitored during session;Repositioned    Home Living                      Prior Function            PT Goals (current goals can now be found in the care plan section) Acute Rehab PT Goals Patient Stated Goal: None stated Potential to Achieve Goals: Fair Progress towards PT goals: Progressing toward goals    Frequency    Min 4X/week      PT Plan Current plan remains appropriate    Co-evaluation              AM-PAC PT "6 Clicks" Mobility   Outcome Measure  Help needed turning from your back to your side while in a flat bed without using bedrails?: A Little Help needed moving from lying on your back to sitting on the side of a flat bed without using bedrails?: Total Help needed moving to and from a bed to a chair (including a wheelchair)?: Total Help needed standing up from a chair using your arms (e.g., wheelchair or bedside chair)?: Total Help needed to walk in hospital room?: Total Help needed climbing 3-5 steps with a railing? : Total 6 Click Score: 8    End of Session Equipment Utilized During Treatment: Gait belt Activity Tolerance: Patient tolerated treatment well Patient left: with call bell/phone within reach;with family/visitor present;in chair;with chair alarm set (lift pad in place under patient.) Nurse Communication: Mobility status PT Visit Diagnosis: Unsteadiness on feet (R26.81);Other abnormalities of gait and mobility (R26.89);Muscle weakness (generalized) (M62.81);Hemiplegia and  hemiparesis;Pain Hemiplegia - Right/Left: Right Hemiplegia - dominant/non-dominant: Dominant Hemiplegia - caused by: Cerebral infarction Pain - Right/Left: Right Pain - part of body: Hip     Time: 1201-1225 PT Time Calculation (min) (ACUTE ONLY): 24 min  Charges:  $Therapeutic Exercise: 8-22 mins $Therapeutic Activity: 8-22 mins                     Bonney Leitz , PTA Acute Rehabilitation Services Pager [phone] Office [phone]     Esty Ahuja Artis Delay 03/19/2020, 12:55 PM

## 2020-03-20 LAB — GLUCOSE, CAPILLARY
Glucose-Capillary: 118 mg/dL — ABNORMAL HIGH (ref 70–99)
Glucose-Capillary: 138 mg/dL — ABNORMAL HIGH (ref 70–99)
Glucose-Capillary: 122 mg/dL — ABNORMAL HIGH (ref 70–99)
Glucose-Capillary: 98 mg/dL (ref 70–99)

## 2020-03-20 LAB — CREATININE, SERUM
Creatinine, Ser: 2.14 mg/dL — ABNORMAL HIGH (ref 0.61–1.24)
GFR, Estimated: 34 mL/min — ABNORMAL LOW (ref >60)

## 2020-03-20 MED ORDER — AMLODIPINE BESYLATE 10 MG PO TABS
10.0000 mg | ORAL_TABLET | Freq: Every day | ORAL | Status: DC
  Administered 2020-03-21 – 2020-03-29 (×9): 10 mg via ORAL
  Filled 2020-03-20: qty 1
  Filled 2020-03-20: qty 2
  Filled 2020-03-20 (×7): qty 1

## 2020-03-20 MED ORDER — HEPARIN SODIUM (PORCINE) 5000 UNIT/ML IJ SOLN
5000.0000 [IU] | Freq: Three times a day (TID) | INTRAMUSCULAR | Status: DC
  Administered 2020-03-20 – 2020-03-29 (×26): 5000 [IU] via SUBCUTANEOUS
  Filled 2020-03-20 (×25): qty 1

## 2020-03-20 MED ORDER — HYDRALAZINE HCL 20 MG/ML IJ SOLN: 5.0000 mg | INTRAMUSCULAR | Status: DC | PRN

## 2020-03-20 MED ORDER — AMLODIPINE BESYLATE 5 MG PO TABS
5.0000 mg | ORAL_TABLET | Freq: Every day | ORAL | Status: DC
  Administered 2020-03-20: 5 mg via ORAL
  Filled 2020-03-20 (×2): qty 1

## 2020-03-20 MED ORDER — AMLODIPINE BESYLATE 5 MG PO TABS
5.0000 mg | ORAL_TABLET | Freq: Once | ORAL | Status: AC
  Administered 2020-03-20: 5 mg via ORAL
  Filled 2020-03-20: qty 1

## 2020-03-20 MED ORDER — ATORVASTATIN CALCIUM 10 MG PO TABS
20.0000 mg | ORAL_TABLET | Freq: Every day | ORAL | Status: DC
  Administered 2020-03-21 – 2020-03-22 (×2): 20 mg via ORAL
  Filled 2020-03-20 (×2): qty 2

## 2020-03-20 NOTE — Progress Notes (Addendum)
PROGRESS NOTE  MONTAVIS SCHUBRING DJT:[id number] DOB: 1956/02/17 DOA: 03/11/2020 PCP: [name], MD  HPI/Recap of past 24 hours: This 64 years old male with PMH of DMII,CAD(cath 2009 nonobstructive disease), systolic and diastolic CHF(EF 30%),[id number], hypertension and medical noncompliance presents in the emergency department withcomplaints of acute shortness of breath. He reports shortness of breath is progressivelygetting worse. Patient also reports fall from his chair while watching TV resulting in right hip pain,worse with weightbearing. Patient was found to be tachycardic and tachypneic, after an evaluation.Patient was started on intravenous heparin with a concern for possible PE. VQ scan is ordered to rule out PE due to worsening renal functions. Patient was admitted for acute on chronic CHF exacerbation, suspected PE, hypertensive urgency, right hip pain. VQ scan negative, heparin discontinued. Patient was found to have right-sided weakness, slurring of his speech and facial droop during morning round. Code stroke was called.Patient was not deemed a candidate for TPA. Exact time of symptoms unknown. CT head without abnormality in brain parenchyma. Patient was seen by neurology recommended MRI of the brain,PT/OT consult speech consult. Echocardiogram.  MRI moderate size left basal ganglia stroke.  Cardiology consulted for TEE .PT recommended CIR.  03/20/20: No new complaints.  Pending outside facility inpatient rehab placement.  Bedside RN reports restlessness last night.  Started on Seroquel 12.5 mg nightly.  Assessment/Plan: Principal Problem:   Acute on chronic systolic CHF (congestive heart failure) (HCC) Active Problems:   Type 2 diabetes mellitus without complication, with long-term current use of insulin (HCC)   Mixed hyperlipidemia   Coronary artery disease involving native coronary artery of native heart without angina pectoris   Hypertensive urgency   AKI  (acute kidney injury) (HCC)   Fall at home, initial encounter   Right hip pain   Hypokalemia   Cerebral thrombosis with cerebral infarction   Cerebrovascular accident (CVA) (HCC)  Right-sided weakness secondary to bicerebral infarcts with a large left basal ganglia infarct with resultant right hemiplegia with 0 out of 5 strength. Patient is found to have right-sided weakness in morning round 03/12/20. Code Stroke called,  neurology/stroke team evaluated the patient  CTA Head/ Neck : Negative for large vessel occlusion, and no core infarct or ischemic penumbra detected by CT Perfusion. Advised stroke pathway.  MRI: Moderate size acute infarction involving the left basal ganglia and adjacent white matter. Neurology recommended dual antiplatelet therapy for now (aspirin and Plavix) he was on aspirin 81 mg daily prior to admission.  Now he is on aspirin 81 mg daily and Plavix 75 mg daily. Speech therapist following recommendation for dysphagia 3, mechanical soft, thin liquid diet. TEE 03-16-20, no embolic source. Small ASD/PFO without right to left shunting.  Neurology stroke team recommended bilateral lower extremity Doppler ultrasound to rule out DVT.  Follow results. PT OT recommended CIR with 24-hour supervision.  Acute on chronic systolic CHF (congestive heart failure) (HCC)  Patient presented with 2-week history of paroxysmal nocturnal dyspnea and increasing abdominal girth with 1 week history of progressively worsening shortness of breath.  BNP of 705.  Patient has been noncompliant with his antihypertensives likely precipitating acute flare.  Patient was started on Lasix 40 mg IV twice daily.  Strict input and output monitoring  Monitoring renal function and  electrolytes  Monitoring patient on telemetry  Echocardiogram LVEF 45 to 50% , left ventricle has decreased function.  Global hypokinesis       V Q scan negative, heparin discontinued. Continue strict I's and O's and  daily Net I&O -5.4 L Continue aspirin, Coreg 25 mg twice daily, Plavix 75 mg daily, IV Lasix 40 mg twice daily, Zocor 40 mg daily.  Resolved post repletion: Hypokalemia likely secondary to diuretics Serum potassium 3.5 from 3.4, magnesium 2.1 Currently on IV Lasix 40 mg twice daily Continue daily potassium replacement. Repeat BMP in the a.m.  AKI Baseline creatinine was 1.1 in 2019 Presented with creatinine of greater than 2.3 Currently creatinine slightly up trending 2.1 from 1.90 from 2.0 Continue to avoid nephrotoxins and hypotension. Encourage increase in oral free water intake. Monitor urine output Repeat BMP in the morning  Resolved hypertensive urgency BP is currently not at goal, increase Coreg to 37.5 mg twice daily  Patient presented with hypertensive urgency with blood pressures as high as 213/151.   Continue IV Lasix 40 mg twice daily.  Continue to monitor vital signs.  Fall at home, initial encounter  Patient reports suffering a fall at home earlier in the day on 11/18.  Patient complains of right hip pain .  X-ray of the right hip reveals a possible femur fracture-however upon discussion of these images between the emergency department provider and [name] surgery disagrees with this read and recommends more advanced imaging.   CT hip shows unremarkable,  no fracture noted.  Type 2 diabetes mellitus without complication, with long-term current use of insulin (HCC)  Hemoglobin A1c : 6.8  Continue to hold off home oral hypoglycemics  Continue insulin sliding scale  Coronary artery disease involving native coronary artery of native heart without angina pectoris  Continue aspirin, Plavix and statin.    Patient denies any anginal symptoms at the time of this visit.  Troponin S peaked at 129 and trended down  No evidence of acute ischemia on twelve-lead EKG.  Mixed hyperlipidemia LDL is 173 on 03/12/2020 not at goal>> goal less  than 70. Continue Zocor 40 mg daily  Obstructive sleep apnea Patient does have brief periods of apnea during sleep when his respiratory rate goes down to 8. Patient declined participation in the sleep smart study on 03/15/2020 per neurology/stroke team.  Ambulatory dysfunction status post acute CVA PT recommended CIR.  TOC assisting with placement at CIR at outside facility.  Restlessness Started Seroquel 12.5 mg nightly Continue to monitor    DVT prophylaxis:  Subcu heparin 3 times daily Code Status: Full Family Communication:  None at bedside.  Disposition Plan:  Status is: Inpatient  Remains inpatient appropriate because:Inpatient level of care appropriate due to severity of illness   Dispo: The patient is from: Home  Anticipated d/c is to:  CIR  Anticipated d/c date is: 03/21/20, pending CIR placement.  Patient currently is medically stable to d/c.  Consultants:   Neurology  Procedures:  Antimicrobials:     Anti-infectives (From admission, onward)       Objective: Vitals:   03/20/20 0836 03/20/20 1052 03/20/20 1125 03/20/20 1540  BP: (!) 165/115 (!) 131/102 113/87 (!) 138/105  Pulse: 83 84 88 87  Resp: Temp: 98 F (36.7 C)  98 F (36.7 C) 98.1 F (36.7 C)  TempSrc: Oral  Oral Oral  SpO2:   96% 97%  Weight:      Height:        Intake/Output Summary (Last 24 hours) at 03/20/2020 1548 Last data filed at 03/20/2020 1259 Gross per 24 hour  Intake 480 ml  Output 2050 ml  Net -1570 ml   Filed Weights   03/18/20 0420  03/19/20 0440 03/20/20 0450  Weight: 112.2 kg 110 kg 109.3 kg    Exam:  . General: 64 y.o. year-old male well-developed well-nourished no acute stress.  Alert and aphasic.   . Cardiovascular: Regular rate and rhythm no murmur gallops. Marland Kitchen Respiratory: Clear to auscultation no wheezes no rales.  Abdomen: Obese nontender normal bowel sounds present. . Musculoskeletal: No  lower extremity edema bilaterally.  Marland Kitchen Psychiatry: Mood is appropriate for condition and setting.  Data Reviewed: CBC: Recent Labs  Lab 03/14/20 0217 03/15/20 0117 03/16/20 0050 03/17/20 0019 03/18/20 0119  WBC 8.8 7.5 8.0 7.6 8.2  HGB 13.6 13.9 14.3 14.3 13.8  HCT 42.3 43.5 44.3 44.7 43.4  MCV 96.4 95.4 95.9 95.3 95.8  PLT 297 280 281 234 275   Basic Metabolic Panel: Recent Labs  Lab 03/14/20 0217 03/14/20 0217 03/15/20 0117 03/15/20 0117 03/16/20 0050 03/17/20 0019 03/18/20 0119 03/19/20 0126 03/20/20 0521  NA 140   < > 142  --  141 141 138 139  --   K 3.2*   < > 3.2*  --  3.8 3.4* 3.5 3.7  --   CL 105   < > 106  --  106 103 102 102  --   CO2 24   < > 25  --  21* 25 25 27   --   GLUCOSE 123*   < > 98  --  134* 103* 117* 90  --   BUN 35*   < > 36*  --  38* 38* 40* 38*  --   CREATININE 2.16*   < > 2.09*   < > 2.32* 2.08* 1.90* 2.01* 2.14*  CALCIUM 8.4*   < > 8.5*  --  8.5* 8.5* 8.2* 8.5*  --   MG 2.1  --   --   --  2.0 2.1  --  2.2  --   PHOS 4.6  --   --   --  4.6 4.8*  --   --   --    < > = values in this interval not displayed.   GFR: Estimated Creatinine Clearance: 45 mL/min (A) (by C-G formula based on SCr of 2.14 mg/dL (H)). Liver Function Tests: Recent Labs  Lab 03/16/20 0050 03/17/20 0019  AST 30 34  ALT 22 26  ALKPHOS 55 58  BILITOT 1.5* 0.9  PROT 6.8 6.6  ALBUMIN 3.0* 2.9*   No results for input(s): LIPASE, AMYLASE in the last 168 hours. No results for input(s): AMMONIA in the last 168 hours. Coagulation Profile: No results for input(s): INR, PROTIME in the last 168 hours. Cardiac Enzymes: No results for input(s): CKTOTAL, CKMB, CKMBINDEX, TROPONINI in the last 168 hours. BNP (last 3 results) No results for input(s): PROBNP in the last 8760 hours. HbA1C: No results for input(s): HGBA1C in the last 72 hours. CBG: Recent Labs  Lab 03/19/20 1145 03/19/20 1628 03/19/20 2109 03/20/20 0614 03/20/20 1135  GLUCAP 134* 102* 125* 118* 138*    Lipid Profile: No results for input(s): CHOL, HDL, LDLCALC, TRIG, CHOLHDL, LDLDIRECT in the last 72 hours. Thyroid Function Tests: No results for input(s): TSH, T4TOTAL, FREET4, T3FREE, THYROIDAB in the last 72 hours. Anemia Panel: No results for input(s): VITAMINB12, FOLATE, FERRITIN, TIBC, IRON, RETICCTPCT in the last 72 hours. Urine analysis:    Component Value Date/Time   COLORURINE STRAW (A) 03/12/2020 0444   APPEARANCEUR CLEAR 03/12/2020 0444   LABSPEC 1.008 03/12/2020 0444   PHURINE 7.0 03/12/2020 0444   GLUCOSEU NEGATIVE 03/12/2020  0444   GLUCOSEU NEGATIVE 05/03/2017 1231   HGBUR MODERATE (A) 03/12/2020 0444   BILIRUBINUR NEGATIVE 03/12/2020 0444   KETONESUR 5 (A) 03/12/2020 0444   PROTEINUR 100 (A) 03/12/2020 0444   UROBILINOGEN 0.2 05/03/2017 1231   NITRITE NEGATIVE 03/12/2020 0444   LEUKOCYTESUR NEGATIVE 03/12/2020 0444   Sepsis Labs: @LABRCNTIP (procalcitonin:4,lacticidven:4)  ) Recent Results (from the past 240 hour(s))  Respiratory Panel by RT PCR (Flu A&B, Covid) - Nasopharyngeal Swab     Status: None   Collection Time: 03/11/20  6:15 PM   Specimen: Nasopharyngeal Swab; Nasopharyngeal(NP) swabs in vial transport medium  Result Value Ref Range Status   SARS Coronavirus 2 by RT PCR NEGATIVE NEGATIVE Final    Comment: (NOTE) SARS-CoV-2 target nucleic acids are NOT DETECTED.  The SARS-CoV-2 RNA is generally detectable in upper respiratoy specimens during the acute phase of infection. The lowest concentration of SARS-CoV-2 viral copies this assay can detect is 131 copies/mL. A negative result does not preclude SARS-Cov-2 infection and should not be used as the sole basis for treatment or other patient management decisions. A negative result may occur with  improper specimen collection/handling, submission of specimen other than nasopharyngeal swab, presence of viral mutation(s) within the areas targeted by this assay, and inadequate number of viral  copies (<131 copies/mL). A negative result must be combined with clinical observations, patient history, and epidemiological information. The expected result is Negative.  Fact Sheet for Patients:  03/13/20  Fact Sheet for Healthcare Providers:  https://www.moore.com/  This test is no t yet approved or cleared by the https://www.young.biz/ FDA and  has been authorized for detection and/or diagnosis of SARS-CoV-2 by FDA under an Emergency Use Authorization (EUA). This EUA will remain  in effect (meaning this test can be used) for the duration of the COVID-19 declaration under Section 564(b)(1) of the Act, 21 U.S.C. section 360bbb-3(b)(1), unless the authorization is terminated or revoked sooner.     Influenza A by PCR NEGATIVE NEGATIVE Final   Influenza B by PCR NEGATIVE NEGATIVE Final    Comment: (NOTE) The Xpert Xpress SARS-CoV-2/FLU/RSV assay is intended as an aid in  the diagnosis of influenza from Nasopharyngeal swab specimens and  should not be used as a sole basis for treatment. Nasal washings and  aspirates are unacceptable for Xpert Xpress SARS-CoV-2/FLU/RSV  testing.  Fact Sheet for Patients: [place]  Fact Sheet for Healthcare Providers: https://www.moore.com/  This test is not yet approved or cleared by the https://www.young.biz/ FDA and  has been authorized for detection and/or diagnosis of SARS-CoV-2 by  FDA under an Emergency Use Authorization (EUA). This EUA will remain  in effect (meaning this test can be used) for the duration of the  Covid-19 declaration under Section 564(b)(1) of the Act, 21  U.S.C. section 360bbb-3(b)(1), unless the authorization is  terminated or revoked. Performed at [facility], [address], [place], [place]       Studies: No results found.  Scheduled Meds: . [START ON 03/21/2020] amLODipine  10 mg Oral Daily  . aspirin  EC  81 mg Oral Daily  . [START ON 03/21/2020] atorvastatin  20 mg Oral Daily  . carvedilol  37.5 mg Oral BID WC  . clopidogrel  75 mg Oral Daily  . furosemide  40 mg Intravenous BID  . insulin aspart  0-15 Units Subcutaneous TID AC & HS  . magnesium oxide  400 mg Oral Daily  . potassium chloride  40 mEq Oral Daily  .  QUEtiapine  12.5 mg Oral QHS  . sodium chloride flush  3 mL Intravenous Q12H    Continuous Infusions: . sodium chloride       LOS: 8 days     Darlin Drop, MD [facility] Pager [phone]  If 7PM-7AM, please contact night-coverage [email or web address] Password [facility] 03/20/2020, 3:48 PM

## 2020-03-20 NOTE — Progress Notes (Signed)
Mobility Specialist - Progress Note   03/20/20 1059  Mobility  Activity Transferred:  Bed to chair  Level of Assistance +2 (takes two people)  Assistive Device Stedy  Mobility Response Tolerated well  Mobility performed by Mobility specialist;Family member  $Mobility charge 1 Mobility    Pre-mobility: 84 HR, 144/103 BP, 100% SpO2 Post-mobility: 87 HR, 131/102 BP, 95% SpO2  Pt able to use both left extremities to assist with sitting up in bed. +2 assist to stand w/ stedy. Pt in recliner w/ pillow placed under his R arm. Family member in room.   Trevor Anderson Mobility Specialist Mobility Specialist Phone: [phone]

## 2020-03-21 LAB — GLUCOSE, CAPILLARY
Glucose-Capillary: 111 mg/dL — ABNORMAL HIGH (ref 70–99)
Glucose-Capillary: 184 mg/dL — ABNORMAL HIGH (ref 70–99)
Glucose-Capillary: 87 mg/dL (ref 70–99)
Glucose-Capillary: 93 mg/dL (ref 70–99)

## 2020-03-21 NOTE — Progress Notes (Signed)
Mobility Specialist - Progress Note   03/21/20 1315  Mobility  Activity Refused mobility   Refused transfer to chair today. Pt c/o RLE pain, RN made aware.   Mamie Levers Mobility Specialist Mobility Specialist Phone: [phone]

## 2020-03-21 NOTE — Plan of Care (Signed)
POC initiated 

## 2020-03-21 NOTE — Progress Notes (Signed)
PROGRESS NOTE  Trevor Anderson KCM:[id number] DOB: 24-Aug-1955 DOA: 03/11/2020 PCP: [name], MD  HPI/Recap of past 24 hours: This 64 years old male with PMH of DMII,CAD(cath 2009 nonobstructive disease), systolic and diastolic CHF(EF 05%),[id number], hypertension and medical noncompliance presents in the emergency department withcomplaints of acute shortness of breath. He reports shortness of breath is progressivelygetting worse. Patient also reports fall from his chair while watching TV resulting in right hip pain,worse with weightbearing. Patient was found to be tachycardic and tachypneic, after an evaluation.Patient was started on intravenous heparin with a concern for possible PE. VQ scan is ordered to rule out PE due to worsening renal functions. Patient was admitted for acute on chronic CHF exacerbation, suspected PE, hypertensive urgency, right hip pain. VQ scan negative, heparin discontinued. Patient was found to have right-sided weakness, slurring of his speech and facial droop during morning round. Code stroke was called.Patient was not deemed a candidate for TPA. Exact time of symptoms unknown. CT head without abnormality in brain parenchyma. Patient was seen by neurology recommended MRI of the brain,PT/OT consult speech consult. Echocardiogram.  MRI moderate size left basal ganglia stroke.  Cardiology consulted for TEE.  PT recommended CIR.  Was started on Seroquel 12.5 mg nightly for restlessness.  03/21/20: No new complaints.  Awaiting placement at outside facility CIR, possibly [facility].  Assessment/Plan: Principal Problem:   Acute on chronic systolic CHF (congestive heart failure) (HCC) Active Problems:   Type 2 diabetes mellitus without complication, with long-term current use of insulin (HCC)   Mixed hyperlipidemia   Coronary artery disease involving native coronary artery of native heart without angina pectoris   Hypertensive urgency   AKI (acute  kidney injury) (HCC)   Fall at home, initial encounter   Right hip pain   Hypokalemia   Cerebral thrombosis with cerebral infarction   Cerebrovascular accident (CVA) (HCC)  Right-sided weakness secondary to bicerebral infarcts with a large left basal ganglia infarct with resultant right hemiplegia with 0 out of 5 strength. Patient is found to have right-sided weakness in morning round 03/12/20. Code Stroke called,  neurology/stroke team evaluated the patient  CTA Head/ Neck : Negative for large vessel occlusion, and no core infarct or ischemic penumbra detected by CT Perfusion. Advised stroke pathway.  MRI: Moderate size acute infarction involving the left basal ganglia and adjacent white matter. Neurology recommended dual antiplatelet therapy for now (aspirin and Plavix) he was on aspirin 81 mg daily prior to admission.  Now he is on aspirin 81 mg daily and Plavix 75 mg daily. Speech therapist following recommendation for dysphagia 3, mechanical soft, thin liquid diet. TEE 03-16-20, no embolic source. Small ASD/PFO without right to left shunting.  Neurology stroke team recommended bilateral lower extremity Doppler ultrasound to rule out DVT.  Follow results. PT OT recommended CIR with 24-hour supervision.  Acute on chronic systolic CHF (congestive heart failure) (HCC)  Patient presented with 2-week history of paroxysmal nocturnal dyspnea and increasing abdominal girth with 1 week history of progressively worsening shortness of breath.  BNP of 705.  Patient has been noncompliant with his antihypertensives likely precipitating acute flare.  Patient was started on Lasix 40 mg IV twice daily.  Strict input and output monitoring  Monitoring renal function and  electrolytes  Monitoring patient on telemetry  Echocardiogram LVEF 45 to 50% , left ventricle has decreased function.  Global hypokinesis       V Q scan negative, heparin discontinued. Continue strict I's and O's and  daily Net I&O -5.4 L Continue aspirin, Coreg 25 mg twice daily, Plavix 75 mg daily, IV Lasix 40 mg twice daily, Zocor 40 mg daily.  Resolved post repletion: Hypokalemia likely secondary to diuretics Serum potassium 3.5 from 3.4, magnesium 2.1 Currently on IV Lasix 40 mg twice daily Continue daily potassium replacement. Repeat BMP in the a.m.  AKI Baseline creatinine was 1.1 in 2019 Presented with creatinine of greater than 2.3 Currently creatinine slightly up trending 2.1 from 1.90 from 2.0 Continue to avoid nephrotoxins and hypotension. Encourage increase in oral free water intake. Monitor urine output Repeat BMP in the morning  Resolved hypertensive urgency BP is currently not at goal, increase Coreg to 37.5 mg twice daily  Patient presented with hypertensive urgency with blood pressures as high as 213/151.   Continue IV Lasix 40 mg twice daily.  Continue to monitor vital signs.  Fall at home, initial encounter  Patient reports suffering a fall at home earlier in the day on 11/18.  Patient complains of right hip pain .  X-ray of the right hip reveals a possible femur fracture-however upon discussion of these images between the emergency department provider and [name] surgery disagrees with this read and recommends more advanced imaging.   CT hip shows unremarkable,  no fracture noted.  Type 2 diabetes mellitus without complication, with long-term current use of insulin (HCC)  Hemoglobin A1c : 6.8  Continue to hold off home oral hypoglycemics  Continue insulin sliding scale  Coronary artery disease involving native coronary artery of native heart without angina pectoris  Continue aspirin, Plavix and statin.    Patient denies any anginal symptoms at the time of this visit.  Troponin S peaked at 129 and trended down  No evidence of acute ischemia on twelve-lead EKG.  Mixed hyperlipidemia LDL is 173 on 03/12/2020 not at goal>> goal less  than 70. Continue Zocor 40 mg daily  Obstructive sleep apnea Patient does have brief periods of apnea during sleep when his respiratory rate goes down to 8. Patient declined participation in the sleep smart study on 03/15/2020 per neurology/stroke team.  Ambulatory dysfunction status post acute CVA PT recommended CIR.  TOC assisting with placement at CIR at outside facility.  Restlessness Continue Seroquel 12.5 mg nightly Continue to monitor    DVT prophylaxis:  Subcu heparin 3 times daily Code Status: Full Family Communication:  Updated his son at bedside.  Disposition Plan:  Status is: Inpatient    Dispo: The patient is from: Home  Anticipated d/c is to:  CIR at [facility]  Anticipated d/c date is: 03/22/20, pending CIR placement.  Patient currently is medically stable to d/c.  Consultants:   Neurology  Procedures:  Antimicrobials:     Anti-infectives (From admission, onward)       Objective: Vitals:   03/21/20 0529 03/21/20 0740 03/21/20 0803 03/21/20 1200  BP: (!) 135/105 (!) 126/100  (!) 116/99  Pulse:  74  75  Resp: 18 20  (!) 23  Temp: 97.8 F (36.6 C) 97.7 F (36.5 C) 97.6 F (36.4 C) 97.7 F (36.5 C)  TempSrc: Oral Oral Oral Oral  SpO2: 99% 98%  94%  Weight: 106.3 kg     Height: 5\' 11"  (1.803 m)       Intake/Output Summary (Last 24 hours) at 03/21/2020 1425 Last data filed at 03/21/2020 1402 Gross per 24 hour  Intake 1200 ml  Output 1900 ml  Net -700 ml   Filed Weights   03/19/20 0440 03/20/20  0450 03/21/20 0529  Weight: 110 kg 109.3 kg 106.3 kg    Exam: No significant changes from prior.  . General: 64 y.o. year-old male well-developed well-nourished no acute stress.  Alert and aphasic.   . Cardiovascular: Regular rate and rhythm no murmur gallops. Marland Kitchen Respiratory: Clear to auscultation no wheezes no rales.  Abdomen: Obese nontender normal bowel sounds present. . Musculoskeletal: No lower  extremity edema bilaterally.  Marland Kitchen Psychiatry: Mood is appropriate for condition and setting.  Data Reviewed: CBC: Recent Labs  Lab 03/15/20 0117 03/16/20 0050 03/17/20 0019 03/18/20 0119  WBC 7.5 8.0 7.6 8.2  HGB 13.9 14.3 14.3 13.8  HCT 43.5 44.3 44.7 43.4  MCV 95.4 95.9 95.3 95.8  PLT 280 281 234 275   Basic Metabolic Panel: Recent Labs  Lab 03/15/20 0117 03/15/20 0117 03/16/20 0050 03/17/20 0019 03/18/20 0119 03/19/20 0126 03/20/20 0521  NA 142  --  141 141 138 139  --   K 3.2*  --  3.8 3.4* 3.5 3.7  --   CL 106  --  106 103 102 102  --   CO2 25  --  21* 25 25 27   --   GLUCOSE 98  --  134* 103* 117* 90  --   BUN 36*  --  38* 38* 40* 38*  --   CREATININE 2.09*   < > 2.32* 2.08* 1.90* 2.01* 2.14*  CALCIUM 8.5*  --  8.5* 8.5* 8.2* 8.5*  --   MG  --   --  2.0 2.1  --  2.2  --   PHOS  --   --  4.6 4.8*  --   --   --    < > = values in this interval not displayed.   GFR: Estimated Creatinine Clearance: 44.4 mL/min (A) (by C-G formula based on SCr of 2.14 mg/dL (H)). Liver Function Tests: Recent Labs  Lab 03/16/20 0050 03/17/20 0019  AST 30 34  ALT 22 26  ALKPHOS 55 58  BILITOT 1.5* 0.9  PROT 6.8 6.6  ALBUMIN 3.0* 2.9*   No results for input(s): LIPASE, AMYLASE in the last 168 hours. No results for input(s): AMMONIA in the last 168 hours. Coagulation Profile: No results for input(s): INR, PROTIME in the last 168 hours. Cardiac Enzymes: No results for input(s): CKTOTAL, CKMB, CKMBINDEX, TROPONINI in the last 168 hours. BNP (last 3 results) No results for input(s): PROBNP in the last 8760 hours. HbA1C: No results for input(s): HGBA1C in the last 72 hours. CBG: Recent Labs  Lab 03/20/20 1135 03/20/20 1617 03/20/20 2134 03/21/20 0621 03/21/20 1149  GLUCAP 138* 122* 98 111* 184*   Lipid Profile: No results for input(s): CHOL, HDL, LDLCALC, TRIG, CHOLHDL, LDLDIRECT in the last 72 hours. Thyroid Function Tests: No results for input(s): TSH, T4TOTAL,  FREET4, T3FREE, THYROIDAB in the last 72 hours. Anemia Panel: No results for input(s): VITAMINB12, FOLATE, FERRITIN, TIBC, IRON, RETICCTPCT in the last 72 hours. Urine analysis:    Component Value Date/Time   COLORURINE STRAW (A) 03/12/2020 0444   APPEARANCEUR CLEAR 03/12/2020 0444   LABSPEC 1.008 03/12/2020 0444   PHURINE 7.0 03/12/2020 0444   GLUCOSEU NEGATIVE 03/12/2020 0444   GLUCOSEU NEGATIVE 05/03/2017 1231   HGBUR MODERATE (A) 03/12/2020 0444   BILIRUBINUR NEGATIVE 03/12/2020 0444   KETONESUR 5 (A) 03/12/2020 0444   PROTEINUR 100 (A) 03/12/2020 0444   UROBILINOGEN 0.2 05/03/2017 1231   NITRITE NEGATIVE 03/12/2020 0444   LEUKOCYTESUR NEGATIVE 03/12/2020 0444   Sepsis  Labs: @LABRCNTIP (procalcitonin:4,lacticidven:4)  ) Recent Results (from the past 240 hour(s))  Respiratory Panel by RT PCR (Flu A&B, Covid) - Nasopharyngeal Swab     Status: None   Collection Time: 03/11/20  6:15 PM   Specimen: Nasopharyngeal Swab; Nasopharyngeal(NP) swabs in vial transport medium  Result Value Ref Range Status   SARS Coronavirus 2 by RT PCR NEGATIVE NEGATIVE Final    Comment: (NOTE) SARS-CoV-2 target nucleic acids are NOT DETECTED.  The SARS-CoV-2 RNA is generally detectable in upper respiratoy specimens during the acute phase of infection. The lowest concentration of SARS-CoV-2 viral copies this assay can detect is 131 copies/mL. A negative result does not preclude SARS-Cov-2 infection and should not be used as the sole basis for treatment or other patient management decisions. A negative result may occur with  improper specimen collection/handling, submission of specimen other than nasopharyngeal swab, presence of viral mutation(s) within the areas targeted by this assay, and inadequate number of viral copies (<131 copies/mL). A negative result must be combined with clinical observations, patient history, and epidemiological information. The expected result is Negative.  Fact Sheet  for Patients:  03/13/20  Fact Sheet for Healthcare Providers:  https://www.moore.com/  This test is no t yet approved or cleared by the https://www.young.biz/ FDA and  has been authorized for detection and/or diagnosis of SARS-CoV-2 by FDA under an Emergency Use Authorization (EUA). This EUA will remain  in effect (meaning this test can be used) for the duration of the COVID-19 declaration under Section 564(b)(1) of the Act, 21 U.S.C. section 360bbb-3(b)(1), unless the authorization is terminated or revoked sooner.     Influenza A by PCR NEGATIVE NEGATIVE Final   Influenza B by PCR NEGATIVE NEGATIVE Final    Comment: (NOTE) The Xpert Xpress SARS-CoV-2/FLU/RSV assay is intended as an aid in  the diagnosis of influenza from Nasopharyngeal swab specimens and  should not be used as a sole basis for treatment. Nasal washings and  aspirates are unacceptable for Xpert Xpress SARS-CoV-2/FLU/RSV  testing.  Fact Sheet for Patients: [place]  Fact Sheet for Healthcare Providers: https://www.moore.com/  This test is not yet approved or cleared by the https://www.young.biz/ FDA and  has been authorized for detection and/or diagnosis of SARS-CoV-2 by  FDA under an Emergency Use Authorization (EUA). This EUA will remain  in effect (meaning this test can be used) for the duration of the  Covid-19 declaration under Section 564(b)(1) of the Act, 21  U.S.C. section 360bbb-3(b)(1), unless the authorization is  terminated or revoked. Performed at [facility], [address] [place], [place]       Studies: No results found.  Scheduled Meds: . amLODipine  10 mg Oral Daily  . aspirin EC  81 mg Oral Daily  . atorvastatin  20 mg Oral Daily  . carvedilol  37.5 mg Oral BID WC  . clopidogrel  75 mg Oral Daily  . furosemide  40 mg Intravenous BID  . heparin injection (subcutaneous)  5,000  Units Subcutaneous Q8H  . insulin aspart  0-15 Units Subcutaneous TID AC & HS  . magnesium oxide  400 mg Oral Daily  . QUEtiapine  12.5 mg Oral QHS  . sodium chloride flush  3 mL Intravenous Q12H    Continuous Infusions: . sodium chloride       LOS: 9 days     [id number], MD [facility] Pager [phone]  If 7PM-7AM, please contact night-coverage [email or web address] Password [facility] 03/21/2020, 2:24 PM

## 2020-03-21 NOTE — Care Management (Signed)
LVM awaiting callback from liaison at [facility] CIR to see if Berkley Harvey has come through, however doubtful since it will be through the [place] who is closed until Monday.

## 2020-03-22 DIAGNOSIS — I5023 Acute on chronic systolic (congestive) heart failure: Secondary | ICD-10-CM

## 2020-03-22 DIAGNOSIS — I6389 Other cerebral infarction: Secondary | ICD-10-CM

## 2020-03-22 LAB — GLUCOSE, CAPILLARY
Glucose-Capillary: 116 mg/dL — ABNORMAL HIGH (ref 70–99)
Glucose-Capillary: 90 mg/dL (ref 70–99)
Glucose-Capillary: 186 mg/dL — ABNORMAL HIGH (ref 70–99)
Glucose-Capillary: 103 mg/dL — ABNORMAL HIGH (ref 70–99)

## 2020-03-22 MED ORDER — ASPIRIN 81 MG PO TABS: 81.0000 mg | ORAL_TABLET | Freq: Every day | ORAL | 0 refills | Status: AC

## 2020-03-22 MED ORDER — CLOPIDOGREL BISULFATE 75 MG PO TABS: 75.0000 mg | ORAL_TABLET | Freq: Every day | ORAL | 0 refills | Status: AC

## 2020-03-22 MED ORDER — ATORVASTATIN CALCIUM 80 MG PO TABS
80.0000 mg | ORAL_TABLET | Freq: Every day | ORAL | Status: DC
  Administered 2020-03-23 – 2020-03-29 (×7): 80 mg via ORAL
  Filled 2020-03-22 (×7): qty 1

## 2020-03-22 MED ORDER — FUROSEMIDE 40 MG PO TABS
40.0000 mg | ORAL_TABLET | Freq: Every day | ORAL | Status: DC
  Administered 2020-03-22 – 2020-03-28 (×7): 40 mg via ORAL
  Filled 2020-03-22 (×8): qty 1

## 2020-03-22 MED ORDER — FUROSEMIDE 40 MG PO TABS
40.0000 mg | ORAL_TABLET | Freq: Every day | ORAL | 0 refills | Status: DC
Start: 2020-03-22 — End: 2022-01-12

## 2020-03-22 MED ORDER — QUETIAPINE FUMARATE 25 MG PO TABS
12.5000 mg | ORAL_TABLET | Freq: Every day | ORAL | 0 refills | Status: DC
Start: 2020-03-22 — End: 2021-07-11

## 2020-03-22 MED ORDER — AMLODIPINE BESYLATE 10 MG PO TABS
10.0000 mg | ORAL_TABLET | Freq: Every day | ORAL | 0 refills | Status: DC
Start: 2020-03-23 — End: 2022-01-12

## 2020-03-22 MED ORDER — SENNA 8.6 MG PO TABS
2.0000 | ORAL_TABLET | Freq: Two times a day (BID) | ORAL | Status: DC
  Administered 2020-03-22 – 2020-03-29 (×14): 17.2 mg via ORAL
  Filled 2020-03-22 (×15): qty 2

## 2020-03-22 MED ORDER — SENNA 8.6 MG PO TABS: 2.0000 | ORAL_TABLET | Freq: Every day | ORAL | 0 refills | Status: AC

## 2020-03-22 MED ORDER — SIMVASTATIN 40 MG PO TABS
40.0000 mg | ORAL_TABLET | ORAL | 0 refills | Status: DC
Start: 2020-03-22 — End: 2020-03-22

## 2020-03-22 MED ORDER — POTASSIUM CHLORIDE CRYS ER 20 MEQ PO TBCR
20.0000 meq | EXTENDED_RELEASE_TABLET | Freq: Every day | ORAL | 0 refills | Status: DC
Start: 2020-03-22 — End: 2021-07-11

## 2020-03-22 MED ORDER — ASPIRIN 81 MG PO TABS
81.0000 mg | ORAL_TABLET | Freq: Every day | ORAL | 0 refills | Status: DC
Start: 2020-03-22 — End: 2020-03-22

## 2020-03-22 MED ORDER — ATORVASTATIN CALCIUM 80 MG PO TABS
80.0000 mg | ORAL_TABLET | Freq: Every day | ORAL | 0 refills | Status: DC
Start: 2020-03-23 — End: 2022-01-12

## 2020-03-22 MED ORDER — CARVEDILOL 12.5 MG PO TABS
37.5000 mg | ORAL_TABLET | Freq: Two times a day (BID) | ORAL | 0 refills | Status: DC
Start: 2020-03-23 — End: 2022-01-12

## 2020-03-22 NOTE — Progress Notes (Signed)
Received patient to room 3E 20 at 1650. Patient is alert and oriented x 4, needs extra time to answer questions but can answer appropriately. He does seem to struggle with word salad but knows which words are incorrect and does exhibit some frustration. I reassured him that there is no rush and that he can take as much time as he needs to find the right words, but that I would also offer word suggestions to help him correctly make his needs known. Patient does exhibit some slight facial droop on the right, is unable to voluntarily move the right arm or leg at this time. Abdomen is soft and obese, hypoactive bowel sounds in all quads. He reports last BM was today. BLE have trace edema, pedal pulses are stong and palpable bilaterally. Condom cath in place draining straw colored cloudy urine. Patient reported being in mild discomfort, so will provide a PRN med for this. He requires assistance with eating meals and needs extra time to chew. He was oriented to the room and the unit. Nurse tech at bedside feeding him his dinner at this time. Call light placed on left side so that he can request help as needed.

## 2020-03-22 NOTE — Progress Notes (Addendum)
PROGRESS NOTE  Trevor TOPEL RJJ:[id number] DOB: 03-May-1955 DOA: 03/11/2020 PCP: [name], MD  HPI/Recap of past 24 hours: This 64 years old male with PMH of DMII,CAD(cath 2009 nonobstructive disease), chronic diastolic CHF(EF 55%),hyperlipidemia, hypertension and medical noncompliance presents in the emergency department withcomplaints of acute shortness of breath.  He reports shortness of breath is progressivelygetting worse. Patient also reports fall from his chair while watching TV resulting in right hip pain,worse with weightbearing. Patient was found to be tachycardic and tachypneic, after an evaluation.Patient was started on intravenous heparin with a concern for possible PE. VQ scan ordered to rule out PE due to worsening renal functions. Patient was admitted for acute on chronic dCHF exacerbation, suspected PE, hypertensive urgency, right hip pain. VQ scan negative, heparin discontinued. On 03/12/20, Patient was found to have right-sided weakness, slurring of his speech and facial droop during morning round.  Code stroke was called.Patient was not deemed a candidate for TPA. Exact time of symptoms unknown.  CT head without abnormality in brain parenchyma.  Patient was seen by neurology/stroke team recommended MRI of the brain,PT/OT/speech consult.  2D Echocardiogram.  MRI brain showed moderate size left basal ganglia stroke.  Cardiology was consulted for TEE.  TEE showed small PFO with no right to left shunt.  PT assessed and recommended CIR.  Was started on Seroquel 12.5 mg nightly on 03/19/20 for restlessness.  03/22/20: Patient was seen and examined this morning.  No acute events overnight.  Currently awaiting a bed at [facility] inpatient rehab.  Patient has a [employer] which is not accepted by our CIR.  Assessment/Plan: Principal Problem:   Acute on chronic systolic CHF (congestive heart failure) (HCC) Active Problems:   Type 2 diabetes mellitus without  complication, with long-term current use of insulin (HCC)   Mixed hyperlipidemia   Coronary artery disease involving native coronary artery of native heart without angina pectoris   Hypertensive urgency   AKI (acute kidney injury) (HCC)   Fall at home, initial encounter   Right hip pain   Hypokalemia   Cerebral thrombosis with cerebral infarction   Cerebrovascular accident (CVA) (HCC)  Right-sided weakness secondary to bicerebral infarcts with a large left basal ganglia infarct with resultant right hemiplegia with 0 out of 5 strength. Patient is found to have right-sided weakness in morning round 03/12/20. Code Stroke called,  neurology/stroke team evaluated the patient  CTA Head/ Neck : Negative for large vessel occlusion, and no core infarct or ischemic penumbra detected by CT Perfusion. Advised stroke pathway.  MRI brain: Moderate size acute infarction involving the left basal ganglia and adjacent white matter. Neurology recommended dual antiplatelet therapy for now (aspirin and Plavix) he was on aspirin 81 mg daily prior to admission.  Now he is on aspirin 81 mg daily and Plavix 75 mg daily x 3 weeks, then plavix alone. Currently on Zocor 40 mg daily, patient has agreed to be compliant, now switching to Lipitor 80 mg daily.  Discussed with neurology/stroke team Dr. [name] on 03/22/20. Speech therapist recommended dysphagia 3, mechanical soft, thin liquid diet. TEE 03-16-20, no embolic source. Small ASD/PFO without right to left shunting.  Bilateral lower extremity Doppler ultrasound negative for DVT.  PT OT recommended CIR with 24-hour supervision.  Cryptogenic stroke Electrophysiology consulted for possible implantable loop recorder placement Will be done at dc, likely on 03/23/20 Will need to follow up with Dr. [name] outpatient and cardiology  Non ischemic cardiomyopathy EF 45-50% Acute on chronic combined diastolic  and systolic CHF Elevated troponin 2/2 stroke   Patient presented  with 2-week history of paroxysmal nocturnal dyspnea and increasing abdominal girth with 1 week history of progressively worsening shortness of breath.  BNP of 705.  Patient was noncompliant with his antihypertensives likely precipitating acute flare.  Patient was started on Lasix 40 mg IV twice daily, stopped on 03/22/20.  Restart home po lasix 40 mg daily.  C/w Strict input and output monitoring  2D Echocardiogram 03/12/20 LVEF 45 to 50% , left ventricle has decreased function.  Global hypokinesis       V Q scan negative.  Net I&O -9.5L Continue aspirin 81 mg daily, Coreg 25 mg twice daily, Plavix 75 mg daily, PO Lasix 40 mg daily, Lipitor 80 mg daily.  Resolved post repletion: Hypokalemia likely secondary to diuretics Serum potassium 3.7 from 3.5, magnesium 2.1 Continue daily potassium replacement while on diuretic.  AKI, unclear of baseline Baseline creatinine was 1.1 in 2019 Presented with creatinine of greater than 2.3 Currently creatinine 2.1 wit GFR 34 Continue to avoid nephrotoxins and hypotension.  Resolved hypertensive urgency BP is at goal, c/w Coreg to 37.5 mg twice daily  Patient presented with hypertensive urgency with blood pressures as high as 213/151.   Continue PO Lasix 40 mg daily.  Fall at home, initial encounter  Patient reports suffering a fall at home earlier in the day on 11/18.  Patient complains of right hip pain .  X-ray of the right hip reveals a possible femur fracture-however upon discussion of these images between the emergency department provider and [name] surgery disagrees with this read and recommends more advanced imaging.   CT hip shows unremarkable,  no fracture noted.  Type 2 diabetes mellitus without complication, with long-term current use of insulin (HCC)  Hemoglobin A1c : 6.8  Continue to hold off home oral hypoglycemics  ISS in the hospital   Avoid hypoglycemia  Coronary artery disease involving  native coronary artery of native heart without angina pectoris  Continue aspirin, Plavix and statin.    Patient denies any anginal symptoms at the time of this visit.  Troponin S peaked at 129 and trended down  No evidence of acute ischemia on twelve-lead EKG.  Hyperlipidemia LDL is 173 on 03/12/2020 not at goal>> goal less than 70. Lipitor 80 mg daily  Obstructive sleep apnea Patient does have brief periods of apnea during sleep when his respiratory rate goes down to 8. Patient declined participation in the sleep smart study on 03/15/2020 per neurology/stroke team.  Ambulatory dysfunction status post acute CVA PT recommended CIR.  TOC assisting with placement at CIR at outside facility.  Restlessness Continue Seroquel 12.5 mg nightly    DVT prophylaxis:  Subcu heparin 3 times daily Code Status: Full Family Communication:  Updated his son at bedside on 03/21/20.  Disposition Plan:  Status is: Inpatient    Dispo: The patient is from: Home  Anticipated d/c is to:  CIR at [facility]  Anticipated d/c date is: 03/23/20, pending CIR placement.  Patient currently is medically stable to d/c.  Consultants:   Neurology  Procedures:  Antimicrobials:     Anti-infectives (From admission, onward)       Objective: Vitals:   03/22/20 0000 03/22/20 0611 03/22/20 0818 03/22/20 1133  BP: (!) 147/91  (!) 119/94 106/69  Pulse: 85  86 84  Resp: (!) 22  (!) 21 16  Temp: 98.7 F (37.1 C)  98.7 F (37.1 C) 99.1 F (37.3 C)  TempSrc:  Oral  Oral Oral  SpO2: 98%  98% 97%  Weight:  106.5 kg    Height:  5\' 11"  (1.803 m)      Intake/Output Summary (Last 24 hours) at 03/22/2020 1254 Last data filed at 03/22/2020 0127 Gross per 24 hour  Intake 960 ml  Output 2050 ml  Net -1090 ml   Filed Weights   03/20/20 0450 03/21/20 0529 03/22/20 0611  Weight: 109.3 kg 106.3 kg 106.5 kg    Exam:  . General: 65 y.o. year-old male  pleasant well-developed well-nourished in no acute distress.  Alert and answers questions appropriately with moderate expressive aphasia. . Cardiovascular: Regular rate and rhythm no rubs or gallops. 68 Respiratory: Clear to auscultation no wheezes or rales.  Abdomen: Obese nontender normal bowel sounds present. . Musculoskeletal: No lower extremity edema bilaterally. Marland Kitchen Psychiatry: Mood is appropriate for condition and setting.   Data Reviewed: CBC: Recent Labs  Lab 03/16/20 0050 03/17/20 0019 03/18/20 0119  WBC 8.0 7.6 8.2  HGB 14.3 14.3 13.8  HCT 44.3 44.7 43.4  MCV 95.9 95.3 95.8  PLT 281 234 275   Basic Metabolic Panel: Recent Labs  Lab 03/16/20 0050 03/17/20 0019 03/18/20 0119 03/19/20 0126 03/20/20 0521  NA 141 141 138 139  --   K 3.8 3.4* 3.5 3.7  --   CL 106 103 102 102  --   CO2 21* 25 25 27   --   GLUCOSE 134* 103* 117* 90  --   BUN 38* 38* 40* 38*  --   CREATININE 2.32* 2.08* 1.90* 2.01* 2.14*  CALCIUM 8.5* 8.5* 8.2* 8.5*  --   MG 2.0 2.1  --  2.2  --   PHOS 4.6 4.8*  --   --   --    GFR: Estimated Creatinine Clearance: 44.4 mL/min (A) (by C-G formula based on SCr of 2.14 mg/dL (H)). Liver Function Tests: Recent Labs  Lab 03/16/20 0050 03/17/20 0019  AST 30 34  ALT 22 26  ALKPHOS 55 58  BILITOT 1.5* 0.9  PROT 6.8 6.6  ALBUMIN 3.0* 2.9*   No results for input(s): LIPASE, AMYLASE in the last 168 hours. No results for input(s): AMMONIA in the last 168 hours. Coagulation Profile: No results for input(s): INR, PROTIME in the last 168 hours. Cardiac Enzymes: No results for input(s): CKTOTAL, CKMB, CKMBINDEX, TROPONINI in the last 168 hours. BNP (last 3 results) No results for input(s): PROBNP in the last 8760 hours. HbA1C: No results for input(s): HGBA1C in the last 72 hours. CBG: Recent Labs  Lab 03/21/20 1149 03/21/20 1623 03/21/20 2122 03/22/20 0621 03/22/20 1129  GLUCAP 184* 87 93 116* 90   Lipid Profile: No results for input(s): CHOL,  HDL, LDLCALC, TRIG, CHOLHDL, LDLDIRECT in the last 72 hours. Thyroid Function Tests: No results for input(s): TSH, T4TOTAL, FREET4, T3FREE, THYROIDAB in the last 72 hours. Anemia Panel: No results for input(s): VITAMINB12, FOLATE, FERRITIN, TIBC, IRON, RETICCTPCT in the last 72 hours. Urine analysis:    Component Value Date/Time   COLORURINE STRAW (A) 03/12/2020 0444   APPEARANCEUR CLEAR 03/12/2020 0444   LABSPEC 1.008 03/12/2020 0444   PHURINE 7.0 03/12/2020 0444   GLUCOSEU NEGATIVE 03/12/2020 0444   GLUCOSEU NEGATIVE 05/03/2017 1231   HGBUR MODERATE (A) 03/12/2020 0444   BILIRUBINUR NEGATIVE 03/12/2020 0444   KETONESUR 5 (A) 03/12/2020 0444   PROTEINUR 100 (A) 03/12/2020 0444   UROBILINOGEN 0.2 05/03/2017 1231   NITRITE NEGATIVE 03/12/2020 0444   LEUKOCYTESUR NEGATIVE 03/12/2020 0444  Sepsis Labs: @LABRCNTIP (procalcitonin:4,lacticidven:4)  ) No results found for this or any previous visit (from the past 240 hour(s)).    Studies: No results found.  Scheduled Meds: . amLODipine  10 mg Oral Daily  . aspirin EC  81 mg Oral Daily  . atorvastatin  20 mg Oral Daily  . carvedilol  37.5 mg Oral BID WC  . clopidogrel  75 mg Oral Daily  . furosemide  40 mg Intravenous BID  . heparin injection (subcutaneous)  5,000 Units Subcutaneous Q8H  . insulin aspart  0-15 Units Subcutaneous TID AC & HS  . QUEtiapine  12.5 mg Oral QHS  . senna  2 tablet Oral BID  . sodium chloride flush  3 mL Intravenous Q12H    Continuous Infusions: . sodium chloride       LOS: 10 days     Darlin Drop, MD [facility] Pager [phone]  If 7PM-7AM, please contact night-coverage [email or web address] Password [facility] 03/22/2020, 12:54 PM

## 2020-03-22 NOTE — Progress Notes (Signed)
  Speech Language Pathology Treatment: Dysphagia;Cognitive-Linquistic  Patient Details Name: Trevor Anderson MRN: [id number] DOB: 02-Nov-1955 Today's Date: 03/22/2020 Time: 1215-1227; 8938-1017 SLP Time Calculation (min) (ACUTE ONLY): 12 min  Assessment / Plan / Recommendation Clinical Impression  SWALLOWING Pt tolerated mechanical soft solids, regular solids, and thin liquid with no clinical s/s of aspiration.  Pt exhibited excellent oral clearance of solids and used liquid wash independently with regular solids.  There was minimal anterior spillage x1 with thin liquid, of which pt was aware. RN reports good tolerance of current diet.   Recommend continuing mechanical soft diet with thin liquids.  COMMUNICATION Pt continues to exhibit mild-moderate dysarthria.  Pt was 100% comprehensible in conversation today with short phrases and sentences; however articulatory imprecision and phoneme distortion and omission were still noted without impairing message. Pt intermittently lost ability to phonate.  It is unclear if this is due to decreased breath support or impaired vocal fold adduction.  These episodes were typically brief, although frustrating for pt, with pt able to continue conversation afterwards.  Son was present for evaluation and noted that his sister had been told to encourage spirometer use 5x an hour to "help push out word."  RN present and will help remind pt to use spirometer.  Although there is not any evidence that spirometer use will improve dysarthria or increase phonatory support, it should not negatively impact pt's communication.  Cognition was assessed today using portions of the COGNISTAT (see below for additional information).  Pt's cognition appears relatively spared. Pt exhibited impairments in memory with delayed recall task.  Pt also exhibited mild impairment with command following task and requested repetition of instructions.  Repetition tasks for language and digit span were  omitted given pt's speech impairments.  COGNISTAT - all subtests were in the average range, except where otherwise specified Orientation: 12/12 Attention: not assessed Comprehension: 4/6, mild impairment Repetition: not assessed Naming: 8/8 Construction: not assessed Memory: 5/12, moderate-severe impairment Calculations: 4/4 Similarities: 5/8 Judgment: 5/6    HPI HPI: 64 year old male with past medical history of diabetes mellitus type 2, coronary artery disease (cath 2009 with nonobstructive disease), systolic and diastolic congestive heart failure, hyperlipidemia, hypertension and medical noncompliance who presented to [facility] emergency department 11/18 with complaints of shortness of breath. Code stroke called am 11/19 due to acute right sided weakness and dysarthria.  Failed RN stroke swallow screen. MRI brain:11/22: Moderate size acute infarction involving the left basal ganglia and adjacent white matter. Additional small acute infarcts involving the right corona radiata and basal ganglia and left paramedian cerebellum.      SLP Plan  Continue with current plan of care       Recommendations  Diet recommendations: Dysphagia 3 (mechanical soft);Thin liquid Liquids provided via: Cup;Straw Medication Administration: Crushed with puree Supervision: Full supervision/cueing for compensatory strategies;Staff to assist with self feeding Compensations: Minimize environmental distractions;Lingual sweep for clearance of pocketing;Monitor for anterior loss;Follow solids with liquid Postural Changes and/or Swallow Maneuvers: Seated upright 90 degrees                Oral Care Recommendations: Oral care BID Follow up Recommendations: Inpatient Rehab SLP Visit Diagnosis: Dysphagia, oropharyngeal phase (R13.12) Plan: Continue with current plan of care       GO                Kerrie Pleasure, MA, CCC-SLP Acute Rehabilitation Services Office: [phone] 03/22/2020,  12:30 PM

## 2020-03-22 NOTE — Progress Notes (Signed)
Orthopedic Tech Progress Note Patient Details:  Trevor Anderson [birth year] [id number]  Ortho Devices Type of Ortho Device: Knee Immobilizer Ortho Device/Splint Location: Right Lower Extremity Ortho Device/Splint Interventions: Ordered,Deliverd to patients room for use when walking   Post Interventions Instructions Provided: Care of device, Poper ambulation with device   Gerald Stabs 03/22/2020, 1:03 PM

## 2020-03-22 NOTE — Progress Notes (Signed)
Report given to RN on 3 east. All questions answered.   Hazle Nordmann, RN

## 2020-03-22 NOTE — Progress Notes (Signed)
Physical Therapy Treatment Patient Details Name: Trevor Anderson MRN: [id number] DOB: 02/22/1956 Today's Date: 03/22/2020    History of Present Illness 64 year old male with past medical history of diabetes mellitus type 2, coronary artery disease (cath 2009 with nonobstructive disease), systolic and diastolic congestive heart failure, hyperlipidemia, hypertension and medical noncompliance who presented to [facility] emergency department 11/18 with complaints of shortness of breath. Code stroke called am 11/19 due to acute right sided weakness and dysarthria.    PT Comments    Pt supine in bed on arrival.  Pt voicing "need bed pan now."  Pt assisted into standing to BSC.  Pt was successful with BM.  He continues to progress but limited this session to toilet transfer based on patient's needs.  He remains an excellent candidate for aggressive rehab in a post acute setting to maximize functional gains before returning home.  Pt is very motivated.     Follow Up Recommendations  CIR;Supervision/Assistance - 24 hour ([facility])     Equipment Recommendations  Wheelchair (measurements PT);Wheelchair cushion (measurements PT);3in1 (PT);Hospital bed    Recommendations for Other Services       Precautions / Restrictions Precautions Precautions: Fall Restrictions Weight Bearing Restrictions: No Other Position/Activity Restrictions: R sided hemiparesis.    Mobility  Bed Mobility Overal bed mobility: Needs Assistance Bed Mobility: Rolling   Sidelying to sit: Mod assist;Max assist       General bed mobility comments: Pt able to come to sitting edge of bed to the L with mod assistance, presents with LOB and required max assistance to return to a seated position.  Transfers Overall transfer level: Needs assistance Equipment used: Ambulation equipment used (sara stedy) Transfers: Sit to/from Stand Sit to Stand: Mod assist;+2 physical assistance;From elevated surface          General transfer comment: Pt able to follow commands to utilize L side of his body to come into standing.  Stedy blocked R knee and PTA facilitated trunk control and weight bearing to R shoulder and head control.  Emphasis on finding midline, he continues to require cues to correct posture to midline.  Ambulation/Gait                 Stairs             Wheelchair Mobility    Modified Rankin (Stroke Patients Only)       Balance Overall balance assessment: Needs assistance Sitting-balance support: Single extremity supported;Feet supported Sitting balance-Leahy Scale: Poor Sitting balance - Comments: initially pt with no sitting balance requiring max A to remain upright, pt pushing wtih LUE posterior and to R. therapist providing cueing to come forward to the L toward therapist, pt able to correct balance and maintain briefly before standing Postural control: Posterior lean;Right lateral lean;Other (comment)   Standing balance-Leahy Scale: Zero Standing balance comment: Heavy reliance on external assistance to facilitate R UE and RLE during standing.                            Cognition Arousal/Alertness: Awake/alert Behavior During Therapy: WFL for tasks assessed/performed Overall Cognitive Status: Difficult to assess Area of Impairment: Following commands;Safety/judgement                       Following Commands: Follows one step commands inconsistently;Follows one step commands with increased time Safety/Judgement: Decreased awareness of safety;Decreased awareness of deficits     General Comments:  Pt is able to string together 3-4 words to say simple sentences.  He is more alert and aware of his environment.  Voiced needing to have a BM and he moved to commode and did have a BM.      Exercises General Exercises - Lower Extremity Heel Slides: PROM;Right;10 reps;Supine Hip ABduction/ADduction: Right;10 reps;Supine;Limitations;PROM Other  Exercises Other Exercises: hand clasped with chest presses to midline x 10 reps.  AAROM.    General Comments        Pertinent Vitals/Pain Pain Assessment: Faces Faces Pain Scale: Hurts even more Pain Location: R LE and R UE. Pain Descriptors / Indicators: Grimacing;Guarding Pain Intervention(s): Monitored during session;Repositioned    Home Living                      Prior Function            PT Goals (current goals can now be found in the care plan section) Acute Rehab PT Goals Patient Stated Goal: None stated Potential to Achieve Goals: Fair Progress towards PT goals: Progressing toward goals    Frequency    Min 4X/week      PT Plan Current plan remains appropriate    Co-evaluation              AM-PAC PT "6 Clicks" Mobility   Outcome Measure  Help needed turning from your back to your side while in a flat bed without using bedrails?: A Little Help needed moving from lying on your back to sitting on the side of a flat bed without using bedrails?: Total Help needed moving to and from a bed to a chair (including a wheelchair)?: Total Help needed standing up from a chair using your arms (e.g., wheelchair or bedside chair)?: A Lot Help needed to walk in hospital room?: Total Help needed climbing 3-5 steps with a railing? : Total 6 Click Score: 9    End of Session Equipment Utilized During Treatment: Gait belt Activity Tolerance: Patient tolerated treatment well Patient left: with call bell/phone within reach;with family/visitor present;in chair;with chair alarm set Nurse Communication: Mobility status PT Visit Diagnosis: Unsteadiness on feet (R26.81);Other abnormalities of gait and mobility (R26.89);Muscle weakness (generalized) (M62.81);Hemiplegia and hemiparesis;Pain Hemiplegia - Right/Left: Right Hemiplegia - dominant/non-dominant: Dominant Hemiplegia - caused by: Cerebral infarction Pain - Right/Left: Right Pain - part of body: Hip      Time: 8299-3716 PT Time Calculation (min) (ACUTE ONLY): 32 min  Charges:  $Therapeutic Exercise: 8-22 mins $Therapeutic Activity: 8-22 mins                     Trevor Anderson , PTA Acute Rehabilitation Services Pager [phone] Office [phone]     Trevor Anderson 03/22/2020, 11:27 AM

## 2020-03-22 NOTE — Plan of Care (Signed)
°  Problem: Education: °Goal: Ability to demonstrate management of disease process will improve °Outcome: Progressing °Goal: Ability to verbalize understanding of medication therapies will improve °Outcome: Progressing °Goal: Individualized Educational Video(s) °Outcome: Progressing °  °

## 2020-03-22 NOTE — Consult Note (Addendum)
ELECTROPHYSIOLOGY CONSULT NOTE  Patient ID: Trevor Anderson MRN: [id number], DOB/AGE: [birth year]   Admit date: 03/11/2020 Date of Consult: 03/22/2020  Primary Physician: [name], MD Primary Cardiologist: Dr. Mayford Knife (2016) Reason for Consultation: Cryptogenic stroke ; recommendations regarding Implantable Loop Recorder, requested by Dr. Pearlean Brownie  History of Present Illness Trevor Anderson was admitted on 03/11/2020 with with 2 week hx of worsening SOB, DOE, symptoms of orthopnea, w/u in the ER for CHF vs PE, initially started on heparin gtt >> VQ negative and stopped. Also found with hypertensive emergency 213/151, pt mentioned intermittent noncompliance with his medicines. AKI though suspected perhaps some degree of CKD with poor out patient follow up/labs, unclear. While here he developed R sided weakness, facial droop, and dysarthria and found with stroke Neurology noted  Bicerebral infarcts with a large left basal ganglia infarct with resultant right hemiplegia-etiology likely cryptogenic even though most infarcts are subcortical and recommended EP evaluation for loop implant  PMHx includes: CAD (95% ramus IM at time of  Cath in 2009), NICM w/recovered LVEF, DM, HTN, HLD, obesity, medical noncompliance is mentioned, chronic CHF (combined)    he has undergone workup for stroke including echocardiogram and carotid angio.  The patient has been monitored on telemetry which has demonstrated sinus rhythm with no arrhythmias.   Inpatient stroke work-up was to be completed with a TEE that noted  IMPRESSIONS  1. Left ventricular ejection fraction, by estimation, is 45 to 50%. The  left ventricle has mildly decreased function. The left ventricle  demonstrates global hypokinesis.  2. Right ventricular systolic function is normal. The right ventricular  size is normal.  3. No left atrial/left atrial appendage thrombus was detected.  4. The mitral valve is normal in structure. Mild to  moderate mitral valve  regurgitation. No evidence of mitral stenosis.  5. The aortic valve is tricuspid. Aortic valve regurgitation is trivial.  No aortic stenosis is present.  6. There is Moderate (Grade III) plaque involving the descending aorta.  7. Evidence of atrial level shunting detected by color flow Doppler.  Agitated saline contrast bubble study was negative, with no evidence of  any interatrial shunt. There is a small patent foramen ovale with  predominantly left to right shunting across  the atrial septum.  Conclusion(s)/Recommendation(s): Very small left to right intra-atrial  flow seen by color Doppler. However, negative bubble study. This suggests  small ASD/PFO without right to left shunting.    03/12/2020: TTE IMPRESSIONS  1. Left ventricular ejection fraction, by estimation, is 45 to 50%. The  left ventricle has mildly decreased function. The left ventricle  demonstrates global hypokinesis. There is mild concentric left ventricular  hypertrophy. Left ventricular diastolic  parameters are consistent with Grade I diastolic dysfunction (impaired  relaxation).  2. Right ventricular systolic function is normal. The right ventricular  size is normal. Tricuspid regurgitation signal is inadequate for assessing  PA pressure.  3. The mitral valve is grossly normal. Trivial mitral valve  regurgitation. No evidence of mitral stenosis.  4. The aortic valve is tricuspid. Aortic valve regurgitation is not  visualized. No aortic stenosis is present.  5. The inferior vena cava is normal in size with greater than 50%  respiratory variability, suggesting right atrial pressure of 3 mmHg.    09/15/2014 LVEF 55-60%   Prior to admission, the patient denies chest pain, shortness of breath, dizziness, palpitations, or syncope.  They are recovering from their stroke with plans to Rehab at discharge.  Past Medical History:  Diagnosis Date   CARDIOMYOPATHY, SECONDARY  01/24/2010   CONGESTIVE HEART FAILURE 11/12/2007   DIABETES MELLITUS, TYPE II 11/20/2008   HYPERLIPIDEMIA 11/12/2007   HYPERTENSION 11/12/2007   TACHYCARDIA 01/24/2010     Surgical History:  Past Surgical History:  Procedure Laterality Date   BUBBLE STUDY  03/16/2020   Procedure: BUBBLE STUDY;  Surgeon: Jodelle Red, MD;  Location: [facility] ENDOSCOPY;  Service: Cardiovascular;;   TEE WITHOUT CARDIOVERSION N/A 03/16/2020   Procedure: TRANSESOPHAGEAL ECHOCARDIOGRAM (TEE);  Surgeon: Jodelle Red, MD;  Location[facility] ENDOSCOPY;  Service: Cardiovascular;  Laterality: N/A;   TONSILLECTOMY  1970     Medications Prior to Admission  Medication Sig Dispense Refill Last Dose   amLODipine (NORVASC) 5 MG tablet TAKE 1 TABLET BY MOUTH ONCE DAILY (Patient taking differently: Take 5 mg by mouth daily. ) 90 tablet 3 03/08/2020 at Unknown time   aspirin 81 MG tablet Take 81 mg by mouth daily.     03/08/2020 at Unknown time   carvedilol (COREG) 25 MG tablet TAKE 1 TABLET BY MOUTH TWICE DAILY WITH MEALS (Patient taking differently: Take 25 mg by mouth 2 (two) times daily with a meal. ) 60 tablet 11 03/08/2020 at Unknown time   furosemide (LASIX) 40 MG tablet TAKE 1 TABLET BY MOUTH ONCE DAILY (Patient taking differently: Take 40 mg by mouth daily. ) 90 tablet 1 03/08/2020 at Unknown time   hydrALAZINE (APRESOLINE) 25 MG tablet TAKE 1 TABLET BY MOUTH THREE TIMES DAILY (Patient taking differently: Take 25 mg by mouth 3 (three) times daily. ) 270 tablet 0 03/11/2020 at Unknown time   isosorbide mononitrate (IMDUR) 60 MG 24 hr tablet TAKE 1 TABLET BY MOUTH ONCE DAILY (Patient taking differently: Take 60 mg by mouth daily. ) 90 tablet 1 03/08/2020 at Unknown time   KLOR-CON M20 20 MEQ tablet TAKE 1 TABLET BY MOUTH ONCE DAILY (Patient taking differently: Take 20 mEq by mouth daily. ) 90 tablet 1 03/08/2020   lisinopril (PRINIVIL,ZESTRIL) 20 MG tablet TAKE 1 TABLET BY MOUTH ONCE DAILY (Patient  taking differently: Take 20 mg by mouth daily. ) 90 tablet 3 03/08/2020   metFORMIN (GLUCOPHAGE) 500 MG tablet TAKE 1 TABLET BY MOUTH ONCE DAILY (Patient taking differently: Take 500 mg by mouth daily with breakfast. ) 90 tablet 3 03/08/2020   simvastatin (ZOCOR) 40 MG tablet TAKE 1 TABLET BY MOUTH AT BEDTIME, NEED DR VISIT FOR MORE REFILLS (Patient taking differently: Take 40 mg by mouth every morning. ) 90 tablet 2 03/08/2020   Lancets MISC 1 application by Does not apply route daily. 100 each 11     Inpatient Medications:   amLODipine  10 mg Oral Daily   aspirin EC  81 mg Oral Daily   atorvastatin  20 mg Oral Daily   carvedilol  37.5 mg Oral BID WC   clopidogrel  75 mg Oral Daily   furosemide  40 mg Intravenous BID   heparin injection (subcutaneous)  5,000 Units Subcutaneous Q8H   insulin aspart  0-15 Units Subcutaneous TID AC & HS   QUEtiapine  12.5 mg Oral QHS   senna  2 tablet Oral BID   sodium chloride flush  3 mL Intravenous Q12H    Allergies: No Known Allergies  Social History   Socioeconomic History   Marital status: Single    Spouse name: Not on file   Number of children: Not on file   Years of education: Not on file   Highest education  level: Not on file  Occupational History   Not on file  Tobacco Use   Smoking status: Never Smoker   Smokeless tobacco: Never Used  Substance and Sexual Activity   Alcohol use: Yes    Comment: social   Drug use: No   Sexual activity: Not on file  Other Topics Concern   Not on file  Social History Narrative   Not on file   Social Determinants of Health   Financial Resource Strain:    Difficulty of Paying Living Expenses: Not on file  Food Insecurity:    Worried About Running Out of Food in the Last Year: Not on file   Ran Out of Food in the Last Year: Not on file  Transportation Needs:    Lack of Transportation (Medical): Not on file   Lack of Transportation (Non-Medical): Not on file   Physical Activity:    Days of Exercise per Week: Not on file   Minutes of Exercise per Session: Not on file  Stress:    Feeling of Stress : Not on file  Social Connections:    Frequency of Communication with Friends and Family: Not on file   Frequency of Social Gatherings with Friends and Family: Not on file   Attends Religious Services: Not on file   Active Member of Clubs or Organizations: Not on file   Attends Banker Meetings: Not on file   Marital Status: Not on file  Intimate Partner Violence:    Fear of Current or Ex-Partner: Not on file   Emotionally Abused: Not on file   Physically Abused: Not on file   Sexually Abused: Not on file     Family History  Problem Relation Age of Onset   Kidney disease Mother       Review of Systems: All other systems reviewed and are otherwise negative except as noted above.  Physical Exam: Vitals:   03/21/20 2037 03/22/20 0000 03/22/20 0611 03/22/20 0818  BP: 137/86 (!) 147/91  (!) 119/94  Pulse: 84 85  86  Resp: 19 (!) 22  (!) 21  Temp: 98.7 F (37.1 C) 98.7 F (37.1 C)  98.7 F (37.1 C)  TempSrc: Oral Oral  Oral  SpO2: 94% 98%  98%  Weight:   106.5 kg   Height:   5\' 11"  (1.803 m)     GEN- The patient is well appearing, alert and oriented x 3 today.   Head- normocephalic, atraumatic Eyes-  Sclera clear, conjunctiva pink Ears- hearing intact Oropharynx- clear Neck- supple Lungs- CTA b/l, normal work of breathing Heart- RRR, no murmurs, rubs or gallops  GI- soft, NT, ND Extremities- no clubbing, cyanosis, or edema MS- no significant deformity or atrophy Skin- no rash or lesion Psych- euthymic mood, full affect   Labs:   Lab Results  Component Value Date   WBC 8.2 03/18/2020   HGB 13.8 03/18/2020   HCT 43.4 03/18/2020   MCV 95.8 03/18/2020   PLT 275 03/18/2020    Recent Labs  Lab 03/17/20 0019 03/18/20 0119 03/19/20 0126 03/19/20 0126 03/20/20 0521  NA 141   < > 139  --   --    K 3.4*   < > 3.7  --   --   CL 103   < > 102  --   --   CO2 25   < > 27  --   --   BUN 38*   < > 38*  --   --  CREATININE 2.08*   < > 2.01*   < > 2.14*  CALCIUM 8.5*   < > 8.5*  --   --   PROT 6.6  --   --   --   --   BILITOT 0.9  --   --   --   --   ALKPHOS 58  --   --   --   --   ALT 26  --   --   --   --   AST 34  --   --   --   --   GLUCOSE 103*   < > 90  --   --    < > = values in this interval not displayed.   Lab Results  Component Value Date   CKTOTAL 379 (H) 08/22/2007   CKMB 2.8 08/22/2007   TROPONINI  08/22/2007    0.06        PERSISTENTLY INCREASED TROPONIN VALUES IN THE RANGE OF 0.06-0.49 ng/mL CAN BE SEEN IN:       -UNSTABLE ANGINA   Lab Results  Component Value Date   CHOL 232 (H) 03/12/2020   CHOL 127 05/03/2017   CHOL 124 11/01/2016   Lab Results  Component Value Date   HDL 46 03/12/2020   HDL 33.00 (L) 05/03/2017   HDL 36.10 (L) 11/01/2016   Lab Results  Component Value Date   LDLCALC 173 (H) 03/12/2020   LDLCALC 69 05/03/2017   LDLCALC 61 11/01/2016   Lab Results  Component Value Date   TRIG 67 03/12/2020   TRIG 126.0 05/03/2017   TRIG 132.0 11/01/2016   Lab Results  Component Value Date   CHOLHDL 5.0 03/12/2020   CHOLHDL 4 05/03/2017   CHOLHDL 3 11/01/2016   No results found for: LDLDIRECT  Lab Results  Component Value Date   DDIMER 1.39 (H) 03/12/2020     Radiology/Studies:   CT Code Stroke CTA Head W/WO contrast Result Date: 03/12/2020 CLINICAL DATA:  64 year old male code stroke presentation. Right side symptoms. EXAM: CT ANGIOGRAPHY HEAD AND NECK CT PERFUSION BRAIN TECHNIQUE: Multidetector CT imaging of the head and neck was performed using the standard protocol during bolus administration of intravenous contrast. Multiplanar CT image reconstructions and MIPs were obtained to evaluate the vascular anatomy. Carotid stenosis measurements (when applicable) are obtained utilizing NASCET criteria, using the distal internal carotid  diameter as the denominator. Multiphase CT imaging of the brain was performed following IV bolus contrast injection. Subsequent parametric perfusion maps were calculated using RAPID software. CONTRAST:  OMNIPAQUE IOHEXOL 350 MG/ML SOLN COMPARISON:  Plain head CT 0845 hours. FINDINGS: CT Brain Perfusion Findings: ASPECTS: 10 CBF (<30%) Volume: None Perfusion (Tmax>6.0s) volume: None Mismatch Volume: Not applicable Infarction Location:Not applicable CTA NECK Skeleton: No acute osseous abnormality identified. Upper chest: Upper lung atelectasis. Negative visible superior mediastinum. Other neck: The glottis is closed. No acute neck soft tissue finding is identified. Aortic arch: 3 vessel arch configuration. Minimal arch atherosclerosis. Right carotid system: Tortuous brachiocephalic artery and proximal right CCA without stenosis. Minimal calcified plaque at the right ICA origin and bulb without stenosis. Left carotid system: Mildly tortuous proximal left CCA. Mild calcified plaque at the left ICA origin and bulb without stenosis. Vertebral arteries: Calcified plaque and tortuosity at the right subclavian artery origin without significant stenosis. Normal right vertebral artery origin. Tortuous right V1 segment. Patent right vertebral to the skull base without stenosis. Mildly tortuous proximal left subclavian artery without significant plaque. Normal left  vertebral artery origin. Mildly tortuous left V1 segment. Codominant left vertebral is patent to the skull base without plaque or stenosis. CTA HEAD Posterior circulation: Patent right vertebral V4 segment to the vertebrobasilar junction without stenosis. Patent right PICA origin. Normal left PICA origin. Beyond the left PICA in the left V4 segment there is evidence of soft plaque on series 7, image 159 but only mild associated stenosis. Patent left vertebrobasilar junction. Patent basilar artery with mild irregularity but no stenosis. Patent SCA and left PCA  origins. Fetal right PCA origin. Left posterior communicating artery diminutive or absent. Mild to moderate left PCA P2 segment irregularity and stenosis on series 12, image 23. Mild right PCA irregularity without stenosis. Anterior circulation: Both ICA siphons are patent without plaque or stenosis. Normal ophthalmic and right posterior communicating artery origins. Patent carotid termini. Patent MCA and ACA origins. Diminutive or absent anterior communicating artery. Mild bilateral ACA irregularity. No proximal ACA stenosis. Up to moderate distal ACA irregularity and stenosis on series 12, image 21. Right MCA M1 segment is patent to the bifurcation with mild irregularity and stenosis. At the right MCA bifurcation there is moderate stenosis. Right MCA M2 branches are patent. There is moderate right posterior MCA M2 and M3 branch irregularity and stenosis (series 12, image 12. Moderate right anterior M3 irregularity and stenosis. No right MCA branch occlusion identified. Mild to moderate irregularity with mild stenosis of the distal left MCA M1 (series 7, image 116). Left MCA bifurcation is patent. There is no M2 branch occlusion. Only mild proximal M2 irregularity. There is more moderate irregularity of the distal posterior M2 with mild to moderate stenosis (series 12, image 29). No right MCA branch occlusion is identified. Venous sinuses: Early contrast timing, not well evaluated. Anatomic variants: Fetal right PCA origin. Review of the MIP images confirms the above findings IMPRESSION: 1. Negative for large vessel occlusion, and no core infarct or ischemic penumbra detected by CT Perfusion. 2. Mild for age extracranial but fairly advanced intracranial atherosclerosis. Up to moderate circle-of-Willis branch irregularity and stenoses in the bilateral MCAs (M2 and M3), left PCA (P2), and distal ACAs 3. Preliminary results of the above discussed by telephone with Dr. Otelia Limes at [phone] hours. Final results were  communicated to Dr. Otelia Limes at [phone] hours 03/12/2020 by text page via the [facility] messaging system. Electronically Signed   By: Odessa Fleming M.D.   On: 03/12/2020 09:55     MR BRAIN WO CONTRAST Result Date: 03/14/2020 CLINICAL DATA:  Code stroke follow-up, abnormal CT EXAM: MRI HEAD WITHOUT CONTRAST TECHNIQUE: Multiplanar, multiecho pulse sequences of the brain and surrounding structures were obtained without intravenous contrast. COMPARISON:  Correlation made with prior CT imaging FINDINGS: Motion artifact is present. Brain: There is restricted diffusion involving the left caudate body, corona radiata, and posterior left lentiform nucleus. There are 3 small foci of restricted diffusion involving the right corona radiata and lentiform nucleus. Small focus of restricted diffusion in the left parasagittal cerebellum. There are two punctate foci of susceptibility in the right corona radiata and a third punctate focus in the medial left temporal lobe likely reflecting chronic microhemorrhages. Additional patchy and confluent areas of T2 hyperintensity in the supratentorial white matter are nonspecific but probably reflect moderate chronic microvascular ischemic changes. There is no intracranial mass or significant mass effect. No hydrocephalus. Vascular: Major vessel flow voids at the skull base are preserved. Skull and upper cervical spine: Normal marrow signal is preserved. Sinuses/Orbits: Paranasal sinuses are aerated. Orbits are unremarkable.  Other: Sella is partially empty.  Mastoid air cells are clear. IMPRESSION: Moderate size acute infarction involving the left basal ganglia and adjacent white matter. Additional small acute infarcts involving the right corona radiata and basal ganglia and left paramedian cerebellum. Moderate chronic microvascular ischemic changes. Electronically Signed   By: Guadlupe Spanish M.D.   On: 03/14/2020 14:48    NM Pulmonary Perfusion Result Date: 03/12/2020 CLINICAL DATA:  PE  suspected.  High probability. EXAM: NUCLEAR MEDICINE PERFUSION LUNG SCAN TECHNIQUE: Perfusion images were obtained in multiple projections after intravenous injection of radiopharmaceutical. Ventilation scans intentionally deferred if perfusion scan and chest x-ray adequate for interpretation during COVID 19 epidemic. RADIOPHARMACEUTICALS:  4.3 mCi Tc-74m MAA IV COMPARISON:  None. FINDINGS: No significant filling defects are present. Normal perfusion is present bilaterally. IMPRESSION: Normal V/Q scan.  No pulmonary embolus. Electronically Signed   By: Marin Roberts M.D.   On: 03/12/2020 13:44    CT HIP RIGHT WO CONTRAST Result Date: 03/12/2020 CLINICAL DATA:  Concern for hip fracture.  Inconclusive x-ray EXAM: CT OF THE RIGHT HIP WITHOUT CONTRAST TECHNIQUE: Multidetector CT imaging of the right hip was performed according to the standard protocol. Multiplanar CT image reconstructions were also generated. COMPARISON:  Radiography from yesterday FINDINGS: Bones/Joint/Cartilage No acute fracture or dislocation. Hip osteoarthritis with superolateral joint narrowing causing bone-on-bone contact and subchondral cystic formation. Degenerative marginal spurring at the hip joint which is fairly bulky. Degenerative right sacroiliac spurring. Ligaments Suboptimally assessed by CT. Muscles and Tendons No visible injury. Soft tissues No hematoma or opaque foreign body IMPRESSION: 1. No acute finding. 2. Advanced right hip osteoarthritis. Electronically Signed   By: Marnee Spring M.D.   On: 03/12/2020 04:07     VAS [place] LOWER EXTREMITY VENOUS (DVT) Result Date: 03/19/2020  Lower Venous DVT Study Indications: Stroke.  Comparison Study: No prior study Performing Technologist: Gertie Fey MHA, RDMS, RVT, RDCS  Examination Guidelines: A complete evaluation includes B-mode imaging, spectral Doppler, color Doppler, and power Doppler as needed of all accessible portions of each vessel. Bilateral testing is  considered an integral part of a complete examination. Limited examinations for reoccurring indications may be performed as noted. The reflux portion of the exam is performed with the patient in reverse Trendelenburg.  : Not visualized segments include SFJ, PFV. Summary: RIGHT: - There is no evidence of deep vein thrombosis in the lower extremity. However, portions of this examination were limited- see technologist comments above.  - No cystic structure found in the popliteal fossa.  LEFT: - There is no evidence of deep vein thrombosis in the lower extremity.  - No cystic structure found in the popliteal fossa.  *See table(s) above for measurements and observations. Electronically signed by Heath Lark on 03/19/2020 at 2:49:03 PM.    Final     12-lead ECG SR All prior EKG's in EPIC reviewed with no documented atrial fibrillation  Telemetry only goes back to 03/15/20 is SR only, CV strips were reviewed, note what looks like a PAT though is unknown duration.  Assessment and Plan:  1. Cryptogenic stroke  The has suffered a cryptogenic stroke.  Dr. [name] spoke at length with the patient and his son about monitoring for afib with either a 30 day event monitor or an implantable loop recorder.  Risks, benefits, and alteratives to implantable loop recorder were discussed.   At this time, the patient and his son would like to proceed with implantable loop recorder.   I have asked the attending team  to let us know once he has a rehab bed ready for him/discharge day is known and we will plan to implant day of discharge (looks perhaps tomorrow)  Wound care will be reviewed with the patient at time of implant, (keep incision clean and dry for 3 days).  Wound check will be scheduled for the patient.  Please call with questions.   Sheilah Pigeon, PA-C 03/22/2020  Stroke-bilateral-subcortical-acute  Aphasia/dysarthria/right hemiparesis  Cardiomyopathy-nonischemic-resolved EF 45-50%  Shortness of  breath/dyspnea on exertion  Hypertension  Diabetes  ?  Small PFO without DVT     The patient has a residual significant right hemiparesis that leaves him nonambulatory.  He is able to answer questions with yes no but is very poorly able to speak.  We discussed the role of monitoring in the setting of cryptogenic stroke and the potential benefits of identified atrial fibrillation that would derive from use of anticoagulation compared DAPT.  We further discussed the issue of his historical noncompliance which was affirmed by his son.  Aware that for careful attention to medications would be important going forward, he is agreeable to proceeding with loop recorder implantation.  As he is currently still hospitalized without a definite date for transfer to rehab, we will hold off on device implantation until transfer date.

## 2020-03-22 NOTE — Progress Notes (Signed)
Occupational Therapy Treatment Patient Details Name: Trevor Anderson MRN: [id number] DOB: [birth year] Today's Date: 03/22/2020    History of present illness 64 year old male with past medical history of diabetes mellitus type 2, coronary artery disease (cath 2009 with nonobstructive disease), systolic and diastolic congestive heart failure, hyperlipidemia, hypertension and medical noncompliance who presented to [facility] emergency department 11/18 with complaints of shortness of breath. Code stroke called am 11/19 due to acute right sided weakness and dysarthria.   OT comments  Pt continues to progressing towards established OT goals and presents with increased motivation to participate in therapy. Pt sitting in recliner finishing lunch upon arrival. Requested to perform toileting at [facility]. Pt requiring Max A +2 and stedy to perform sit<>stand for transfer to [facility]. Pt continues to present with R lateral lean and requiring cues for postural corrections. Requiring Mod-Max A +2 for standing balance and Total A for peri care. Continue to recommend dc to CIR and will continue to follow acutely as admitted.    Follow Up Recommendations  CIR;Supervision/Assistance - 24 hour    Equipment Recommendations  Wheelchair (measurements OT);Wheelchair cushion (measurements OT)    Recommendations for Other Services      Precautions / Restrictions Precautions Precautions: Fall Restrictions Weight Bearing Restrictions: No Other Position/Activity Restrictions: R sided hemiparesis.       Mobility Bed Mobility Overal bed mobility: Needs Assistance Bed Mobility: Rolling;Sit to Sidelying Rolling: Min assist Sidelying to sit: Mod assist;Max assist     Sit to sidelying: Mod assist;+2 for safety/equipment General bed mobility comments: Min A for safety with lowering trunk into side lying. Pt able to bring LLE over EOB and requiring assistance for RLE. Min A to roll  Transfers Overall transfer  level: Needs assistance Equipment used: Ambulation equipment used (sara stedy) Transfers: Sit to/from Stand Sit to Stand: Max assist;+2 physical assistance         General transfer comment: Max A +2 to power up into standing from recliner and then [facility]. Providing support at R elbow to weight bearing through RUE when transitioning to standing and then support at elbow and wrist in standing    Balance Overall balance assessment: Needs assistance Sitting-balance support: Single extremity supported;Feet supported Sitting balance-Leahy Scale: Poor Sitting balance - Comments: initially pt with no sitting balance requiring max A to remain upright, pt pushing wtih LUE posterior and to R. therapist providing cueing to come forward to the L toward therapist, pt able to correct balance and maintain briefly before standing Postural control: Right lateral lean   Standing balance-Leahy Scale: Zero Standing balance comment: Heavy reliance on external assistance to facilitate R UE and RLE during standing.                           ADL either performed or assessed with clinical judgement   ADL Overall ADL's : Needs assistance/impaired                         Toilet Transfer: +2 for physical assistance;+2 for safety/equipment;Maximal assistance;Stand-pivot;BSC Toilet Transfer Details (indicate cue type and reason): Max A +2 for power up into standing with use of sara stedy. Transfer to/from [facility] Toileting- Architect and Hygiene: Total assistance;+2 for physical assistance;+2 for safety/equipment;Sit to/from stand Toileting - Clothing Manipulation Details (indicate cue type and reason): Total A for peri care. Mod-Max A +2 for standing balance     Functional mobility during ADLs: Maximal  assistance;+2 for physical assistance;+2 for safety/equipment (stedy) General ADL Comments: Pt very motivated to performing toileting at [facility]. Pt with decreased sequencing at first,  requiring Max A +2 for power up into standing.      Vision   Vision Assessment?: Vision impaired- to be further tested in functional context   Perception     Praxis      Cognition Arousal/Alertness: Awake/alert Behavior During Therapy: WFL for tasks assessed/performed Overall Cognitive Status: Difficult to assess Area of Impairment: Following commands;Safety/judgement;Problem solving                       Following Commands: Follows one step commands inconsistently;Follows one step commands with increased time Safety/Judgement: Decreased awareness of safety;Decreased awareness of deficits   Problem Solving: Slow processing;Requires verbal cues General Comments: Pt putting together 2-4 words in conversation. Pt possibly fatigued from being upright in recliner. Pt wanting to performing BM at recliner. Requiring increased time and cues for sequencing sit<>stand with stedy        Exercises General Exercises - Lower Extremity Heel Slides: PROM;Right;10 reps;Supine Hip ABduction/ADduction: Right;10 reps;Supine;Limitations;PROM Other Exercises Other Exercises: hand clasped with chest presses to midline x 10 reps.  AAROM.   Shoulder Instructions       General Comments Son present throughout    Pertinent Vitals/ Pain       Pain Assessment: Faces Faces Pain Scale: No hurt Pain Location: R LE and R UE. Pain Descriptors / Indicators: Grimacing;Guarding Pain Intervention(s): Monitored during session  Home Living                                          Prior Functioning/Environment              Frequency  Min 2X/week        Progress Toward Goals  OT Goals(current goals can now be found in the care plan section)  Progress towards OT goals: Progressing toward goals  Acute Rehab OT Goals Patient Stated Goal: None stated OT Goal Formulation: With patient Time For Goal Achievement: 03/27/20 Potential to Achieve Goals: Fair ADL Goals Pt  Will Perform Grooming: with min assist;sitting Pt Will Perform Upper Body Bathing: with min assist;sitting Pt Will Perform Upper Body Dressing: with min assist;sitting Additional ADL Goal #1: Patient will maintain edge of bed sitting balance during functional task with min guard in prep for squat pivot transfers to bed side commode.  Plan Discharge plan remains appropriate    Co-evaluation    PT/OT/SLP Co-Evaluation/Treatment: Yes Reason for Co-Treatment: Complexity of the patient's impairments (multi-system involvement);For patient/therapist safety;To address functional/ADL transfers   OT goals addressed during session: ADL's and self-care      AM-PAC OT "6 Clicks" Daily Activity     Outcome Measure   Help from another person eating meals?: A Lot Help from another person taking care of personal grooming?: A Lot Help from another person toileting, which includes using toliet, bedpan, or urinal?: Total Help from another person bathing (including washing, rinsing, drying)?: A Lot Help from another person to put on and taking off regular upper body clothing?: A Lot Help from another person to put on and taking off regular lower body clothing?: Total 6 Click Score: 10    End of Session Equipment Utilized During Treatment: Gait belt  OT Visit Diagnosis: Other abnormalities of gait and mobility (R26.89);Muscle weakness (generalized) (  M62.81);Feeding difficulties (R63.3);Hemiplegia and hemiparesis;Pain Hemiplegia - Right/Left: Right Hemiplegia - dominant/non-dominant: Dominant Hemiplegia - caused by: Cerebral infarction Pain - Right/Left: Right Pain - part of body: Hip   Activity Tolerance Patient tolerated treatment well   Patient Left with call bell/phone within reach;with family/visitor present;with bed alarm set;in bed   Nurse Communication Mobility status        Time: 4174-0814 OT Time Calculation (min): 30 min  Charges: OT General Charges $OT Visit: 1 Visit OT  Treatments $Self Care/Home Management : 23-37 mins  Iniya Matzek MSOT, OTR/L Acute Rehab Pager: [phone] Office: [phone]   Theodoro Grist Ruth Kovich 03/22/2020, 2:09 PM

## 2020-03-23 ENCOUNTER — Encounter (HOSPITAL_COMMUNITY)
Admission: EM | Disposition: A | Discharge status: Home / Self Care | Payer: Self-pay | Source: Home / Self Care | Attending: Internal Medicine

## 2020-03-23 DIAGNOSIS — I639 Cerebral infarction, unspecified: Secondary | ICD-10-CM

## 2020-03-23 HISTORY — PX: LOOP RECORDER INSERTION: EP1214

## 2020-03-23 LAB — SARS CORONAVIRUS 2 BY RT PCR (HOSPITAL ORDER, PERFORMED IN ~~LOC~~ HOSPITAL LAB): SARS Coronavirus 2: NEGATIVE

## 2020-03-23 LAB — GLUCOSE, CAPILLARY
Glucose-Capillary: 110 mg/dL — ABNORMAL HIGH (ref 70–99)
Glucose-Capillary: 175 mg/dL — ABNORMAL HIGH (ref 70–99)
Glucose-Capillary: 107 mg/dL — ABNORMAL HIGH (ref 70–99)
Glucose-Capillary: 165 mg/dL — ABNORMAL HIGH (ref 70–99)

## 2020-03-23 SURGERY — LOOP RECORDER INSERTION

## 2020-03-23 MED ORDER — LIDOCAINE-EPINEPHRINE 1 %-1:100000 IJ SOLN
INTRAMUSCULAR | Status: DC | PRN
  Administered 2020-03-23: 20 mL

## 2020-03-23 MED ORDER — LIDOCAINE-EPINEPHRINE 1 %-1:100000 IJ SOLN
INTRAMUSCULAR | Status: AC
  Filled 2020-03-23: qty 1

## 2020-03-23 SURGICAL SUPPLY — 2 items
MONITOR REVEAL LINQ II (Prosthesis & Implant Heart) ×2 IMPLANT
PACK LOOP INSERTION (CUSTOM PROCEDURE TRAY) ×3 IMPLANT

## 2020-03-23 NOTE — H&P (View-Only) (Signed)
 Progress Note  Patient Name: Trevor Anderson Date of Encounter: 03/23/2020  [facility] Cardiologist: Dr. Turner (2016)  Subjective   Denies CP or SOB, getting more words out today  Inpatient Medications    Scheduled Meds: . amLODipine  10 mg Oral Daily  . aspirin EC  81 mg Oral Daily  . atorvastatin  80 mg Oral Daily  . carvedilol  37.5 mg Oral BID WC  . clopidogrel  75 mg Oral Daily  . furosemide  40 mg Oral Daily  . heparin injection (subcutaneous)  5,000 Units Subcutaneous Q8H  . insulin aspart  0-15 Units Subcutaneous TID AC & HS  . QUEtiapine  12.5 mg Oral QHS  . senna  2 tablet Oral BID  . sodium chloride flush  3 mL Intravenous Q12H   Continuous Infusions: . sodium chloride     PRN Meds: sodium chloride, acetaminophen, hydrALAZINE, nitroGLYCERIN, ondansetron (ZOFRAN) IV, Resource ThickenUp Clear, sodium chloride flush   Vital Signs    Vitals:   03/23/20 0000 03/23/20 0100 03/23/20 0408 03/23/20 0731  BP: (!) 114/93  (!) 141/91   Pulse: 71  84   Resp: 20  20   Temp: 97.9 F (36.6 C)  98.5 F (36.9 C) 98.2 F (36.8 C)  TempSrc: Oral  Oral Oral  SpO2: 99%  97%   Weight:  108.5 kg    Height:        Intake/Output Summary (Last 24 hours) at 03/23/2020 0909 Last data filed at 03/23/2020 0755 Gross per 24 hour  Intake 480 ml  Output 900 ml  Net -420 ml   Last 3 Weights 03/23/2020 03/22/2020 03/21/2020  Weight (lbs) 239 lb 3.2 oz 234 lb 12.6 oz 234 lb 5.6 oz  Weight (kg) 108.5 kg 106.5 kg 106.3 kg      Telemetry    SR - Personally Reviewed  ECG    No new EKGs - Personally Reviewed  Physical Exam   GEN: No acute distress.   Neck: No JVD Cardiac: RRR, no murmurs, rubs, or gallops.  Respiratory: CTA b/l GI: Soft, nontender, non-distended  MS: No edema; No deformity. Neuro:  Nonfocal  Psych: Normal affect   Labs    High Sensitivity Troponin:   Recent Labs  Lab 03/12/20 0217 03/12/20 0612  TROPONINIHS 129* 112*       Chemistry Recent Labs  Lab 03/17/20 0019 03/17/20 0019 03/18/20 0119 03/19/20 0126 03/20/20 0521  NA 141  --  138 139  --   K 3.4*  --  3.5 3.7  --   CL 103  --  102 102  --   CO2 25  --  25 27  --   GLUCOSE 103*  --  117* 90  --   BUN 38*  --  40* 38*  --   CREATININE 2.08*   < > 1.90* 2.01* 2.14*  CALCIUM 8.5*  --  8.2* 8.5*  --   PROT 6.6  --   --   --   --   ALBUMIN 2.9*  --   --   --   --   AST 34  --   --   --   --   ALT 26  --   --   --   --   ALKPHOS 58  --   --   --   --   BILITOT 0.9  --   --   --   --   GFRNONAA 35*   < >   39* 37* 34*  ANIONGAP 13  --  11 10  --    < > = values in this interval not displayed.     Hematology Recent Labs  Lab 03/17/20 0019 03/18/20 0119  WBC 7.6 8.2  RBC 4.69 4.53  HGB 14.3 13.8  HCT 44.7 43.4  MCV 95.3 95.8  MCH 30.5 30.5  MCHC 32.0 31.8  RDW 13.3 13.2  PLT 234 275    BNPNo results for input(s): BNP, PROBNP in the last 168 hours.   DDimer No results for input(s): DDIMER in the last 168 hours.   Radiology    No results found.  Cardiac Studies   TEE 03/16/20  that noted  IMPRESSIONS  1. Left ventricular ejection fraction, by estimation, is 45 to 50%. The  left ventricle has mildly decreased function. The left ventricle  demonstrates global hypokinesis.  2. Right ventricular systolic function is normal. The right ventricular  size is normal.  3. No left atrial/left atrial appendage thrombus was detected.  4. The mitral valve is normal in structure. Mild to moderate mitral valve  regurgitation. No evidence of mitral stenosis.  5. The aortic valve is tricuspid. Aortic valve regurgitation is trivial.  No aortic stenosis is present.  6. There is Moderate (Grade III) plaque involving the descending aorta.  7. Evidence of atrial level shunting detected by color flow Doppler.  Agitated saline contrast bubble study was negative, with no evidence of  any interatrial shunt. There is a small patent foramen  ovale with  predominantly left to right shunting across  the atrial septum.  Conclusion(s)/Recommendation(s): Very small left to right intra-atrial  flow seen by color Doppler. However, negative bubble study. This suggests  small ASD/PFO without right to left shunting.    03/12/2020: TTE IMPRESSIONS  1. Left ventricular ejection fraction, by estimation, is 45 to 50%. The  left ventricle has mildly decreased function. The left ventricle  demonstrates global hypokinesis. There is mild concentric left ventricular  hypertrophy. Left ventricular diastolic  parameters are consistent with Grade I diastolic dysfunction (impaired  relaxation).  2. Right ventricular systolic function is normal. The right ventricular  size is normal. Tricuspid regurgitation signal is inadequate for assessing  PA pressure.  3. The mitral valve is grossly normal. Trivial mitral valve  regurgitation. No evidence of mitral stenosis.  4. The aortic valve is tricuspid. Aortic valve regurgitation is not  visualized. No aortic stenosis is present.  5. The inferior vena cava is normal in size with greater than 50%  respiratory variability, suggesting right atrial pressure of 3 mmHg.    09/15/2014 LVEF 55-60%   VAS US LOWER EXTREMITY VENOUS (DVT) Result Date: 03/19/2020  Lower Venous DVT Study Indications: Stroke.  Comparison Study: No prior study Performing Technologist: Trevor Anderson, Trevor Anderson, Trevor Anderson, Trevor Anderson  Examination Guidelines: A complete evaluation includes B-mode imaging, spectral Doppler, color Doppler, and power Doppler as needed of all accessible portions of each vessel. Bilateral testing is considered an integral part of a complete examination. Limited examinations for reoccurring indications may be performed as noted. The reflux portion of the exam is performed with the patient in reverse Trendelenburg.  : Not visualized segments include SFJ, PFV. Summary: RIGHT: - There is no evidence of deep vein  thrombosis in the lower extremity. However, portions of this examination were limited- see technologist comments above.  - No cystic structure found in the popliteal fossa.  LEFT: - There is no evidence of deep vein thrombosis in the lower   extremity.  - No cystic structure found in the popliteal fossa.  *See table(s) above for measurements and observations. Electronically signed by Trevor Anderson on 03/19/2020 at 2:49:03 PM.    Final    Patient Profile     64 y.o. male PMHx includes: CAD (95% ramus IM at time of Cath in 2009), NICM w/recovered LVEF, DM, HTN, HLD, obesity, medical noncompliance is mentioned, chronic CHF (combined)   was admitted on 03/11/2020 with with 2 week hx of worsening SOB, DOE, symptoms of orthopnea, w/u in the ER for CHF vs PE, initially started on heparin gtt >> VQ negative and stopped. Also found with hypertensive emergency 213/151, pt mentioned intermittent noncompliance with his medicines. AKI though suspected perhaps some degree of CKD with poor out patient follow up/labs, unclear. While here he developed R sided weakness, facial droop, and dysarthria and found with stroke Neurology noted Bicerebral infarcts with a large left basal ganglia infarct with resultant right hemiplegia-etiology likely cryptogenic even though most infarcts are subcortical and recommended EP evaluation for loop implant  IM notes medically ready for discharge, has been waiting on [facility] ins [facility] for rehab and bed placement, suspect will happen today in conversation with SW/case management team yesterday  Assessment & Plan    1. Cryptogenic stroke     Neurology recommends loop implant  yesterday Dr. Klein discussed with his son (who is very supportive) and the patient.  Discussed rational for heart rhythm monitoring, monitoring options, loop implant procedure, potential risks and benefits. Also discussed importance of medical compliance and follow up. The patient understood, and was  agreeable, his son as well.  Telemetry re-reviewed, no AFib Revisited with the patient, he recalled discussion yesterday I re-discussed loop implant with him, he remains agreeable, I also spoke with his on this morning on the phone again, also remains agreeable.  He is [facility] the [facility] and has access to care, he was lost to cardiology follow up since 2016 Will defer cardiology follow up/plans otherwise to [facility], presuming that will be his primary place for care/follow up  EP will follow loop  Dr. Anirudh Baiz will      For questions or updates, please contact [facility] Please consult [email or web address] for contact info under        Signed, Trevor Lynn Ursuy, Trevor Anderson  03/23/2020, 9:09 AM      The patient presents with cryptogenic stroke. TEE and dopplers are reviewed.  I agree with Dr Klein that ILR is indicated.  Risks, benefits, and alteratives to implantable loop recorder were discussed with the patient today.   At this time, the patient is very clear in their decision to proceed with implantable loop recorder.   Monnica Saltsman MD, FACC [facility] 03/23/2020 10:38 AM   

## 2020-03-23 NOTE — Progress Notes (Addendum)
Progress Note  Patient Name: Trevor Anderson Date of Encounter: 03/23/2020  [facility] Cardiologist: Dr. Mayford Knife (2016)  Subjective   Denies CP or SOB, getting more words out today  Inpatient Medications    Scheduled Meds: . amLODipine  10 mg Oral Daily  . aspirin EC  81 mg Oral Daily  . atorvastatin  80 mg Oral Daily  . carvedilol  37.5 mg Oral BID WC  . clopidogrel  75 mg Oral Daily  . furosemide  40 mg Oral Daily  . heparin injection (subcutaneous)  5,000 Units Subcutaneous Q8H  . insulin aspart  0-15 Units Subcutaneous TID AC & HS  . QUEtiapine  12.5 mg Oral QHS  . senna  2 tablet Oral BID  . sodium chloride flush  3 mL Intravenous Q12H   Continuous Infusions: . sodium chloride     PRN Meds: sodium chloride, acetaminophen, hydrALAZINE, nitroGLYCERIN, ondansetron (ZOFRAN) IV, Resource ThickenUp Clear, sodium chloride flush   Vital Signs    Vitals:   03/23/20 0000 03/23/20 0100 03/23/20 0408 03/23/20 0731  BP: (!) 114/93  (!) 141/91   Pulse: 71  84   Resp: 20  20   Temp: 97.9 F (36.6 C)  98.5 F (36.9 C) 98.2 F (36.8 C)  TempSrc: Oral  Oral Oral  SpO2: 99%  97%   Weight:  108.5 kg    Height:        Intake/Output Summary (Last 24 hours) at 03/23/2020 0909 Last data filed at 03/23/2020 0755 Gross per 24 hour  Intake 480 ml  Output 900 ml  Net -420 ml   Last 3 Weights 03/23/2020 03/22/2020 03/21/2020  Weight (lbs) 239 lb 3.2 oz 234 lb 12.6 oz 234 lb 5.6 oz  Weight (kg) 108.5 kg 106.5 kg 106.3 kg      Telemetry    SR - Personally Reviewed  ECG    No new EKGs - Personally Reviewed  Physical Exam   GEN: No acute distress.   Neck: No JVD Cardiac: RRR, no murmurs, rubs, or gallops.  Respiratory: CTA b/l GI: Soft, nontender, non-distended  MS: No edema; No deformity. Neuro:  Nonfocal  Psych: Normal affect   Labs    High Sensitivity Troponin:   Recent Labs  Lab 03/12/20 0217 03/12/20 0612  TROPONINIHS 129* 112*       Chemistry Recent Labs  Lab 03/17/20 0019 03/17/20 0019 03/18/20 0119 03/19/20 0126 03/20/20 0521  NA 141  --  138 139  --   K 3.4*  --  3.5 3.7  --   CL 103  --  102 102  --   CO2 25  --  25 27  --   GLUCOSE 103*  --  117* 90  --   BUN 38*  --  40* 38*  --   CREATININE 2.08*   < > 1.90* 2.01* 2.14*  CALCIUM 8.5*  --  8.2* 8.5*  --   PROT 6.6  --   --   --   --   ALBUMIN 2.9*  --   --   --   --   AST 34  --   --   --   --   ALT 26  --   --   --   --   ALKPHOS 58  --   --   --   --   BILITOT 0.9  --   --   --   --   GFRNONAA 35*   < >  39* 37* 34*  ANIONGAP 13  --  11 10  --    < > = values in this interval not displayed.     Hematology Recent Labs  Lab 03/17/20 0019 03/18/20 0119  WBC 7.6 8.2  RBC 4.69 4.53  HGB 14.3 13.8  HCT 44.7 43.4  MCV 95.3 95.8  MCH 30.5 30.5  MCHC 32.0 31.8  RDW 13.3 13.2  PLT 234 275    BNPNo results for input(s): BNP, PROBNP in the last 168 hours.   DDimer No results for input(s): DDIMER in the last 168 hours.   Radiology    No results found.  Cardiac Studies   TEE 03/16/20  that noted  IMPRESSIONS  1. Left ventricular ejection fraction, by estimation, is 45 to 50%. The  left ventricle has mildly decreased function. The left ventricle  demonstrates global hypokinesis.  2. Right ventricular systolic function is normal. The right ventricular  size is normal.  3. No left atrial/left atrial appendage thrombus was detected.  4. The mitral valve is normal in structure. Mild to moderate mitral valve  regurgitation. No evidence of mitral stenosis.  5. The aortic valve is tricuspid. Aortic valve regurgitation is trivial.  No aortic stenosis is present.  6. There is Moderate (Grade III) plaque involving the descending aorta.  7. Evidence of atrial level shunting detected by color flow Doppler.  Agitated saline contrast bubble study was negative, with no evidence of  any interatrial shunt. There is a small patent foramen  ovale with  predominantly left to right shunting across  the atrial septum.  Conclusion(s)/Recommendation(s): Very small left to right intra-atrial  flow seen by color Doppler. However, negative bubble study. This suggests  small ASD/PFO without right to left shunting.    03/12/2020: TTE IMPRESSIONS  1. Left ventricular ejection fraction, by estimation, is 45 to 50%. The  left ventricle has mildly decreased function. The left ventricle  demonstrates global hypokinesis. There is mild concentric left ventricular  hypertrophy. Left ventricular diastolic  parameters are consistent with Grade I diastolic dysfunction (impaired  relaxation).  2. Right ventricular systolic function is normal. The right ventricular  size is normal. Tricuspid regurgitation signal is inadequate for assessing  PA pressure.  3. The mitral valve is grossly normal. Trivial mitral valve  regurgitation. No evidence of mitral stenosis.  4. The aortic valve is tricuspid. Aortic valve regurgitation is not  visualized. No aortic stenosis is present.  5. The inferior vena cava is normal in size with greater than 50%  respiratory variability, suggesting right atrial pressure of 3 mmHg.    09/15/2014 LVEF 55-60%   VAS [place] LOWER EXTREMITY VENOUS (DVT) Result Date: 03/19/2020  Lower Venous DVT Study Indications: Stroke.  Comparison Study: No prior study Performing Technologist: Gertie Fey MHA, RDMS, RVT, RDCS  Examination Guidelines: A complete evaluation includes B-mode imaging, spectral Doppler, color Doppler, and power Doppler as needed of all accessible portions of each vessel. Bilateral testing is considered an integral part of a complete examination. Limited examinations for reoccurring indications may be performed as noted. The reflux portion of the exam is performed with the patient in reverse Trendelenburg.  : Not visualized segments include SFJ, PFV. Summary: RIGHT: - There is no evidence of deep vein  thrombosis in the lower extremity. However, portions of this examination were limited- see technologist comments above.  - No cystic structure found in the popliteal fossa.  LEFT: - There is no evidence of deep vein thrombosis in the lower  extremity.  - No cystic structure found in the popliteal fossa.  *See table(s) above for measurements and observations. Electronically signed by Heath Lark on 03/19/2020 at 2:49:03 PM.    Final    Patient Profile     64 y.o. male PMHx includes: CAD (95% ramus IM at time of Cath in 2009), NICM w/recovered LVEF, DM, HTN, HLD, obesity, medical noncompliance is mentioned, chronic CHF (combined)   was admitted on 03/11/2020 with with 2 week hx of worsening SOB, DOE, symptoms of orthopnea, w/u in the ER for CHF vs PE, initially started on heparin gtt >> VQ negative and stopped. Also found with hypertensive emergency 213/151, pt mentioned intermittent noncompliance with his medicines. AKI though suspected perhaps some degree of CKD with poor out patient follow up/labs, unclear. While here he developed R sided weakness, facial droop, and dysarthria and found with stroke Neurology noted Bicerebral infarcts with a large left basal ganglia infarct with resultant right hemiplegia-etiology likely cryptogenic even though most infarcts are subcortical and recommended EP evaluation for loop implant  IM notes medically ready for discharge, has been waiting on [place] ins authorization for rehab and bed placement, suspect will happen today in conversation with SW/case management team yesterday  Assessment & Plan    1. Cryptogenic stroke     Neurology recommends loop implant  yesterday Dr. [name] discussed with his son (who is very supportive) and the patient.  Discussed rational for heart rhythm monitoring, monitoring options, loop implant procedure, potential risks and benefits. Also discussed importance of medical compliance and follow up. The patient understood, and was  agreeable, his son as well.  Telemetry re-reviewed, no AFib Revisited with the patient, he recalled discussion yesterday I re-discussed loop implant with him, he remains agreeable, I also spoke with his on this morning on the phone again, also remains agreeable.  He is insured through the [place] system and has access to care, he was lost to cardiology follow up since 2016 Will defer cardiology follow up/plans otherwise to [place], presuming that will be his primary place for care/follow up  EP will follow loop  Dr. Johney Frame will      For questions or updates, please contact [facility] Please consult [email or web address] for contact info under        Signed, Sheilah Pigeon, PA-C  03/23/2020, 9:09 AM      The patient presents with cryptogenic stroke. TEE and dopplers are reviewed.  I agree with Dr [name] that ILR is indicated.  Risks, benefits, and alteratives to implantable loop recorder were discussed with the patient today.   At this time, the patient is very clear in their decision to proceed with implantable loop recorder.   Hillis Range MD, [facility] 03/23/2020 10:38 AM

## 2020-03-23 NOTE — Discharge Instructions (Signed)
Wound care instructions (heart monitor implant site) Keep incision clean and dry for 3 days. You can remove outer dressing tomorrow. Leave steri-strips (little pieces of tape) on until seen in the office for wound check appointment. Call the office [phone]) for redness, drainage, swelling, or fever.    Stroke Prevention Some medical conditions and lifestyle choices can lead to a higher risk for a stroke. You can help to prevent a stroke by making nutrition, lifestyle, and other changes. What nutrition changes can be made?   Eat healthy foods. ? Choose foods that are high in fiber. These include:  Fresh fruits.  Fresh vegetables.  Whole grains. ? Eat at least 5 or more servings of fruits and vegetables each day. Try to fill half of your plate at each meal with fruits and vegetables. ? Choose lean protein foods. These include:  Lowfat (lean) cuts of meat.  Chicken without skin.  Fish.  Tofu.  Beans.  Nuts. ? Eat low-fat dairy products. ? Avoid foods that:  Are high in salt (sodium).  Have saturated fat.  Have trans fat.  Have cholesterol.  Are processed.  Are premade.  Follow eating guidelines as told by your doctor. These may include: ? Reducing how many calories you eat and drink each day. ? Limiting how much salt you eat or drink each day to 1,500 milligrams (mg). ? Using only healthy fats for cooking. These include:  Olive oil.  Canola oil.  Sunflower oil. ? Counting how many carbohydrates you eat and drink each day. What lifestyle changes can be made?  Try to stay at a healthy weight. Talk to your doctor about what a good weight is for you.  Get at least 30 minutes of moderate physical activity at least 5 days a week. This can include: ? Fast walking. ? Biking. ? Swimming.  Do not use any products that have nicotine or tobacco. This includes cigarettes and e-cigarettes. If you need help quitting, ask your doctor. Avoid being around tobacco  smoke in general.  Limit how much alcohol you drink to no more than 1 drink a day for nonpregnant women and 2 drinks a day for men. One drink equals 12 oz of beer, 5 oz of wine, or 1 oz of hard liquor.  Do not use drugs.  Avoid taking birth control pills. Talk to your doctor about the risks of taking birth control pills if: ? You are over 55 years old. ? You smoke. ? You get migraines. ? You have had a blood clot. What other changes can be made?  Manage your cholesterol. ? It is important to eat a healthy diet. ? If your cholesterol cannot be managed through your diet, you may also need to take medicines. Take medicines as told by your doctor.  Manage your diabetes. ? It is important to eat a healthy diet and to exercise regularly. ? If your blood sugar cannot be managed through diet and exercise, you may need to take medicines. Take medicines as told by your doctor.  Control your high blood pressure (hypertension). ? Try to keep your blood pressure below 130/80. This can help lower your risk of stroke. ? It is important to eat a healthy diet and to exercise regularly. ? If your blood pressure cannot be managed through diet and exercise, you may need to take medicines. Take medicines as told by your doctor. ? Ask your doctor if you should check your blood pressure at home. ? Have your blood pressure checked  every year. Do this even if your blood pressure is normal.  Talk to your doctor about getting checked for a sleep disorder. Signs of this can include: ? Snoring a lot. ? Feeling very tired.  Take over-the-counter and prescription medicines only as told by your doctor. These may include aspirin or blood thinners (antiplatelets or anticoagulants).  Make sure that any other medical conditions you have are managed. Where to find more information  American Stroke Association: www.strokeassociation.org  National Stroke Association: www.stroke.org Get help right away if:  You  have any symptoms of stroke. "BE FAST" is an easy way to remember the main warning signs: ? B - Balance. Signs are dizziness, sudden trouble walking, or loss of balance. ? E - Eyes. Signs are trouble seeing or a sudden change in how you see. ? F - Face. Signs are sudden weakness or loss of feeling of the face, or the face or eyelid drooping on one side. ? A - Arms. Signs are weakness or loss of feeling in an arm. This happens suddenly and usually on one side of the body. ? S - Speech. Signs are sudden trouble speaking, slurred speech, or trouble understanding what people say. ? T - Time. Time to call emergency services. Write down what time symptoms started.  You have other signs of stroke, such as: ? A sudden, very bad headache with no known cause. ? Feeling sick to your stomach (nausea). ? Throwing up (vomiting). ? Jerky movements you cannot control (seizure). These symptoms may represent a serious problem that is an emergency. Do not wait to see if the symptoms will go away. Get medical help right away. Call your local emergency services (911 in the [place] Do not drive yourself to the hospital. Summary  You can prevent a stroke by eating healthy, exercising, not smoking, drinking less alcohol, and treating other health problems, such as diabetes, high blood pressure, or high cholesterol.  Do not use any products that contain nicotine or tobacco, such as cigarettes and e-cigarettes.  Get help right away if you have any signs or symptoms of a stroke. This information is not intended to replace advice given to you by your health care provider. Make sure you discuss any questions you have with your health care provider. Document Revised: 06/06/2018 Document Reviewed: 07/12/2016 Elsevier Patient Education  2020 [employer]. Transesophageal Echocardiogram Transesophageal echocardiogram (TEE) is a test that uses sound waves to take pictures of your heart. TEE is done by passing a flexible tube  down the esophagus. The esophagus is the tube that carries food from the throat to the stomach. The pictures give detailed images of your heart. This can help your doctor see if there are problems with your heart. What happens before the procedure? Staying hydrated Follow instructions from your doctor about hydration, which may include:  Up to 3 hours before the procedure - you may continue to drink clear liquids, such as: ? Water. ? Clear fruit juice. ? Black coffee. ? Plain tea.  Eating and drinking Follow instructions from your doctor about eating and drinking, which may include:  8 hours before the procedure - stop eating heavy meals or foods such as meat, fried foods, or fatty foods.  6 hours before the procedure - stop eating light meals or foods, such as toast or cereal.  6 hours before the procedure - stop drinking milk or drinks that contain milk.  3 hours before the procedure - stop drinking clear liquids. General instructions  You will need to take out any dentures or retainers.  Plan to have someone take you home from the hospital or clinic.  If you will be going home right after the procedure, plan to have someone with you for 24 hours.  Ask your doctor about: ? Changing or stopping your normal medicines. This is important if you take diabetes medicines or blood thinners. ? Taking over-the-counter medicines, vitamins, herbs, and supplements. ? Taking medicines such as aspirin and ibuprofen. These medicines can thin your blood. Do not take these medicines unless your doctor tells you to take them. What happens during the procedure?  To lower your risk of infection, your doctors will wash or clean their hands.  An IV will be put into one of your veins.  You will be given a medicine to help you relax (sedative).  A medicine may be sprayed or gargled. This numbs the back of your throat.  Your blood pressure, heart rate, and breathing will be watched.  You may be  asked to lay on your left side.  A bite block will be placed in your mouth. This keeps you from biting the tube.  The tip of the TEE probe will be placed into the back of your mouth.  You will be asked to swallow.  Your doctor will take pictures of your heart.  The probe and bite block will be taken out. The procedure may vary among doctors and hospitals. What happens after the procedure?   Your blood pressure, heart rate, breathing rate, and blood oxygen level will be watched until the medicines you were given have worn off.  When you first wake up, your throat may feel sore and numb. This will get better over time. You will not be allowed to eat or drink until the numbness has gone away.  Do not drive for 24 hours if you were given a medicine to help you relax. Summary  TEE is a test that uses sound waves to take pictures of your heart.  You will be given a medicine to help you relax.  Do not drive for 24 hours if you were given a medicine to help you relax. This information is not intended to replace advice given to you by your health care provider. Make sure you discuss any questions you have with your health care provider. Document Revised: 12/28/2017 Document Reviewed: 07/12/2016 Elsevier Patient Education  2020 [employer].

## 2020-03-23 NOTE — Progress Notes (Addendum)
Physical Therapy Treatment Patient Details Name: Trevor Anderson MRN: [id number] DOB: 01/18/1956 Today's Date: 03/23/2020    History of Present Illness 64 year old male with past medical history of diabetes mellitus type 2, coronary artery disease (cath 2009 with nonobstructive disease), systolic and diastolic congestive heart failure, hyperlipidemia, hypertension and medical noncompliance who presented to [facility] emergency department 11/18 with complaints of shortness of breath. Code stroke called am 11/19 due to acute right sided weakness and dysarthria. MRI with Moderate size acute infarction involving the left basal ganglia.     PT Comments    Pt progressing towards his physical therapy goals; remains motivated to participate. Session focused on transfer training, progressing from use of lift equipment Corene Cornea) to standing up with left hallway railing vs bed railing, pulling up with LUE. Pt requiring two person maximal assist for transfers and emphasis on upright posture with glute and truncal extension in addition to midline positioning. Pt able to accept weight onto RLE with increased knee flexion but no buckle. Continue to address sitting balance, bed mobility and transfers.    Follow Up Recommendations  CIR     Equipment Recommendations  Wheelchair (measurements PT);Wheelchair cushion (measurements PT);3in1 (PT);Hospital bed    Recommendations for Other Services       Precautions / Restrictions Precautions Precautions: Fall;Other (comment) Precaution Comments: R hemiparesis Restrictions Weight Bearing Restrictions: No RUE Weight Bearing: Non weight bearing RLE Weight Bearing: Non weight bearing    Mobility  Bed Mobility Overal bed mobility: Needs Assistance Bed Mobility: Rolling;Sidelying to Sit Rolling: Min assist Sidelying to sit: Max assist       General bed mobility comments: Rolling to right side of bed with cues for contralateral knee flexion and  reaching for rail with LUE. Assist to bring BLE's off edge of bed and trunk to upright  Transfers Overall transfer level: Needs assistance Equipment used: Ambulation equipment used (hall railing, bed rail) Transfers: Sit to/from Stand Sit to Stand: Max assist;+2 physical assistance         General transfer comment: MaxA + 2 to power up to standing to [facility] for subsequent transfer to chair. Pt then performed x 1 transfer with left hall railing via face to face transfer technique and right knee block. Pt then performing x 3 sit to stands pulling up on bed rail with LUE and maxA + 2, right knee block, tactile cues for glute/truncal activation  Ambulation/Gait                 Stairs             Wheelchair Mobility    Modified Rankin (Stroke Patients Only) Modified Rankin (Stroke Patients Only) Pre-Morbid Rankin Score: No symptoms Modified Rankin: Severe disability     Balance Overall balance assessment: Needs assistance Sitting-balance support: Single extremity supported;Feet supported Sitting balance-Leahy Scale: Poor   Postural control: Right lateral lean Standing balance support: Single extremity supported Standing balance-Leahy Scale: Poor Standing balance comment: single UE support and +2 assist required                            Cognition Arousal/Alertness: Awake/alert Behavior During Therapy: WFL for tasks assessed/performed;Flat affect Overall Cognitive Status: Difficult to assess Area of Impairment: Following commands;Safety/judgement;Problem solving                       Following Commands: Follows one step commands inconsistently;Follows one step commands with  increased time Safety/Judgement: Decreased awareness of safety;Decreased awareness of deficits   Problem Solving: Slow processing;Requires verbal cues General Comments: Pt able to make needs known, string together 2-4 words to make a sentence, flat affect       Exercises      General Comments        Pertinent Vitals/Pain Pain Assessment: Faces Faces Pain Scale: No hurt    Home Living                      Prior Function            PT Goals (current goals can now be found in the care plan section) Acute Rehab PT Goals Patient Stated Goal: None stated Potential to Achieve Goals: Good Progress towards PT goals: Progressing toward goals    Frequency    Min 4X/week      PT Plan Current plan remains appropriate    Co-evaluation              AM-PAC PT "6 Clicks" Mobility   Outcome Measure  Help needed turning from your back to your side while in a flat bed without using bedrails?: A Little Help needed moving from lying on your back to sitting on the side of a flat bed without using bedrails?: Total Help needed moving to and from a bed to a chair (including a wheelchair)?: Total Help needed standing up from a chair using your arms (e.g., wheelchair or bedside chair)?: Total Help needed to walk in hospital room?: Total Help needed climbing 3-5 steps with a railing? : Total 6 Click Score: 8    End of Session Equipment Utilized During Treatment: Gait belt Activity Tolerance: Patient tolerated treatment well Patient left: with call bell/phone within reach;in chair;with chair alarm set Nurse Communication: Mobility status PT Visit Diagnosis: Unsteadiness on feet (R26.81);Other abnormalities of gait and mobility (R26.89);Muscle weakness (generalized) (M62.81);Hemiplegia and hemiparesis;Pain Hemiplegia - Right/Left: Right Hemiplegia - dominant/non-dominant: Dominant Hemiplegia - caused by: Cerebral infarction     Time: 0347-4259 PT Time Calculation (min) (ACUTE ONLY): 35 min  Charges:  $Therapeutic Activity: 23-37 mins                     Lillia Pauls, PT, DPT Acute Rehabilitation Services Pager [phone] Office [phone]    Norval Morton 03/23/2020, 12:23 PM

## 2020-03-23 NOTE — Interval H&P Note (Signed)
History and Physical Interval Note:  03/23/2020 10:39 AM  Trevor Anderson  has presented today for surgery, with the diagnosis of stroke.  The various methods of treatment have been discussed with the patient and family. After consideration of risks, benefits and other options for treatment, the patient has consented to  Procedure(s): LOOP RECORDER INSERTION (N/A) as a surgical intervention.  The patient's history has been reviewed, patient examined, no change in status, stable for surgery.  I have reviewed the patient's chart and labs.  Questions were answered to the patient's satisfaction.     Hillis Range

## 2020-03-23 NOTE — Progress Notes (Signed)
Patient alert and oriented x 4. He is able to make needs known but requires extra time to effectively form words and thoughts and will, at times, benefit from suggestions. Resps even and unlabored, lungs clear throughout. He was able to deep breathe effectively and without difficulty. Bowel sounds normoactive in all quads; last BM reported as yesterday. Condom cath in place draining clear yellow urine to gravity. Patient continues to exhibit right sided hemiplegia/paralysis. It appears that his sensation remains intact to the right side but he is completely unable to move arm or leg. He has trace edema to BLE and slight, nonpitting edema to RUE; a pillow was used to elevate left extremity and patient educated on paying attention to its position so that it does not get "trapped" under his back or hip. Patient appears to have intermittent anxiety and restlessness, possibly stemming from his decreased ability to perform self care and position changes as he was prior to his stroke. It is apparent that he is trying to "help" nursing staff reposition himself and medical devices, but gets frustrated that he cannot use both hands. He appears to be eager to learn and participate in self care, so hopefully he will do well in rehab. He does tire easily and is often found sleeping in between rounds. He has periods of apnea but during witnessed episodes, his oxygen levels never drop below 92%. Patient is scheduled for a loop recorder implant today; I was present during informed consent was given by EP PA and signature obtained by patient. Call light is in reach on his left (functional) side. Patient denies pain or discomfort at this time. Will continue to monitor.

## 2020-03-23 NOTE — Plan of Care (Signed)
°  Problem: Education: °Goal: Ability to demonstrate management of disease process will improve °Outcome: Progressing °Goal: Ability to verbalize understanding of medication therapies will improve °Outcome: Progressing °Goal: Individualized Educational Video(s) °Outcome: Progressing °  °

## 2020-03-23 NOTE — Plan of Care (Signed)
Patient progressing well with care plan goals. He is eager to self perform ADLs and benefits from encouragement and set up to allow safe performance.

## 2020-03-23 NOTE — Progress Notes (Signed)
PROGRESS NOTE  Trevor Anderson:[id number] DOB: January 08, 1956 DOA: 03/11/2020 PCP: [name], MD  HPI/Recap of past 24 hours: This 64 years old male with PMH of DMII,CAD(cath 2009 nonobstructive disease), chronic diastolic CHF(EF 55%),hyperlipidemia, hypertension and medical noncompliance presents in the emergency department withcomplaints of acute shortness of breath.  He reports shortness of breath is progressivelygetting worse. Patient also reports fall from his chair while watching TV resulting in right hip pain,worse with weightbearing. Patient was found to be tachycardic and tachypneic, after an evaluation.Patient was started on intravenous heparin with a concern for possible PE. VQ scan ordered to rule out PE due to worsening renal functions. Patient was admitted for acute on chronic dCHF exacerbation, suspected PE, hypertensive urgency, right hip pain. VQ scan negative, heparin discontinued. On 03/12/20, Patient was found to have right-sided weakness, slurring of his speech and facial droop during morning round.  Code stroke was called.Patient was not deemed a candidate for TPA. Exact time of symptoms unknown.  CT head without abnormality in brain parenchyma.  Patient was seen by neurology/stroke team recommended MRI of the brain,PT/OT/speech consult.  2D Echocardiogram.  MRI brain showed moderate size left basal ganglia stroke.  Cardiology was consulted for TEE.  TEE showed small PFO with no right to left shunt.  PT assessed and recommended CIR.  Was started on Seroquel 12.5 mg nightly on 03/19/20 for restlessness.  03/23/20: Patient was seen and examined this morning.  No acute events overnight.  Awaiting ins authorization for a bed at [facility] inpatient rehab.  Patient has a [employer] which is not accepted by our CIR.  Assessment/Plan: Principal Problem:   Acute on chronic systolic CHF (congestive heart failure) (HCC) Active Problems:   Type 2 diabetes  mellitus without complication, with long-term current use of insulin (HCC)   Mixed hyperlipidemia   Coronary artery disease involving native coronary artery of native heart without angina pectoris   Hypertensive urgency   AKI (acute kidney injury) (HCC)   Fall at home, initial encounter   Right hip pain   Hypokalemia   Cerebral thrombosis with cerebral infarction   Cerebrovascular accident (CVA) (HCC)  Right-sided weakness secondary to bicerebral infarcts with a large left basal ganglia infarct with resultant right hemiplegia with 0 out of 5 strength. Patient is found to have right-sided weakness in morning round 03/12/20. Code Stroke called,  neurology/stroke team evaluated the patient  CTA Head/ Neck : Negative for large vessel occlusion, and no core infarct or ischemic penumbra detected by CT Perfusion. Advised stroke pathway.  MRI brain: Moderate size acute infarction involving the left basal ganglia and adjacent white matter. Neurology recommended dual antiplatelet therapy for now (aspirin and Plavix) he was on aspirin 81 mg daily prior to admission.  Now he is on aspirin 81 mg daily and Plavix 75 mg daily x 3 weeks, then plavix alone. Currently on Zocor 40 mg daily, patient has agreed to be compliant, now switching to Lipitor 80 mg daily.  Discussed with neurology/stroke team Dr. [name] on 03/22/20. Speech therapist recommended dysphagia 3, mechanical soft, thin liquid diet. TEE 03-16-20, no embolic source. Small ASD/PFO without right to left shunting.  Bilateral lower extremity Doppler ultrasound negative for DVT.  PT OT recommended CIR with 24-hour supervision.  Cryptogenic stroke Electrophysiology consulted for possible implantable loop recorder placement Will be done at dc, likely on 03/23/20 Will need to follow up with Dr. [name] outpatient and cardiology  Non ischemic cardiomyopathy EF 45-50% Acute on chronic  combined diastolic and systolic CHF Elevated troponin 2/2 stroke    Patient presented with 2-week history of paroxysmal nocturnal dyspnea and increasing abdominal girth with 1 week history of progressively worsening shortness of breath.  BNP of 705.  Patient was noncompliant with his antihypertensives likely precipitating acute flare.  Patient was started on Lasix 40 mg IV twice daily, stopped on 03/22/20.  Restarted home po lasix 40 mg daily.  C/w Strict input and output monitoring  2D Echocardiogram 03/12/20 LVEF 45 to 50% , left ventricle has decreased function.  Global hypokinesis       V Q scan negative.  Net I&O -9.9L Continue aspirin 81 mg daily, Coreg 25 mg twice daily, Plavix 75 mg daily, PO Lasix 40 mg daily, Lipitor 80 mg daily.  Resolved post repletion: Hypokalemia likely secondary to diuretics Serum potassium 3.7 from 3.5, magnesium 2.1 Continue daily potassium replacement while on diuretic.  AKI, unclear of baseline Baseline creatinine was 1.1 in 2019 Presented with creatinine of greater than 2.3 Currently creatinine 2.1 wit GFR 34 Continue to avoid nephrotoxins and hypotension.  Resolved hypertensive urgency BP is at goal, c/w Coreg to 37.5 mg twice daily  Patient presented with hypertensive urgency with blood pressures as high as 213/151.   Continue PO Lasix 40 mg daily.  Fall at home, initial encounter  Patient reports suffering a fall at home earlier in the day on 11/18.  Patient complains of right hip pain .  X-ray of the right hip reveals a possible femur fracture-however upon discussion of these images between the emergency department provider and [name] surgery disagrees with this read and recommends more advanced imaging.   CT hip shows unremarkable,  no fracture noted.  Type 2 diabetes mellitus without complication, with long-term current use of insulin (HCC)  Hemoglobin A1c : 6.8  Continue to hold off home oral hypoglycemics  ISS in the hospital   Avoid hypoglycemia  Coronary  artery disease involving native coronary artery of native heart without angina pectoris  Continue aspirin, Plavix and statin.    Patient denies any anginal symptoms at the time of this visit.  Troponin S peaked at 129 and trended down  No evidence of acute ischemia on twelve-lead EKG.  Hyperlipidemia LDL is 173 on 03/12/2020 not at goal>> goal less than 70. Lipitor 80 mg daily  Obstructive sleep apnea Patient does have brief periods of apnea during sleep when his respiratory rate goes down to 8. Patient declined participation in the sleep smart study on 03/15/2020 per neurology/stroke team.  Ambulatory dysfunction status post acute CVA PT recommended CIR.  TOC assisting with placement at CIR at outside facility.  Restlessness Continue Seroquel 12.5 mg nightly    DVT prophylaxis:  Subcu heparin 3 times daily Code Status: Full Family Communication:  Updated his son at bedside.  Disposition Plan:  Status is: Inpatient    Dispo: The patient is from: Home  Anticipated d/c is to:  CIR at [facility]  Anticipated d/c date is: 03/24/20, pending CIR placement.  Patient currently is medically stable to d/c.  Consultants:   Neurology  Procedures:  Antimicrobials:     Anti-infectives (From admission, onward)       Objective: Vitals:   03/23/20 0408 03/23/20 0731 03/23/20 1135 03/23/20 1548  BP: (!) 141/91  94/71 111/80  Pulse: 84  82 72  Resp: 20  16 16   Temp: 98.5 F (36.9 C) 98.2 F (36.8 C) 97.9 F (36.6 C) 98.1 F (36.7 C)  TempSrc:  Oral Oral Oral Oral  SpO2: 97%  99% 98%  Weight:      Height:        Intake/Output Summary (Last 24 hours) at 03/23/2020 1634 Last data filed at 03/23/2020 1248 Gross per 24 hour  Intake 840 ml  Output 1250 ml  Net -410 ml   Filed Weights   03/21/20 0529 03/22/20 0611 03/23/20 0100  Weight: 106.3 kg 106.5 kg 108.5 kg    Exam:  No significant changes from prior  exam. . General: 64 y.o. year-old male pleasant well-developed well-nourished in no acute distress.  Alert and answers questions appropriately with moderate expressive aphasia. . Cardiovascular: Regular rate and rhythm no rubs or gallops. Marland Kitchen Respiratory: Clear to auscultation no wheezes or rales.  Abdomen: Obese nontender normal bowel sounds present. . Musculoskeletal: No lower extremity edema bilaterally. Marland Kitchen Psychiatry: Mood is appropriate for condition and setting.   Data Reviewed: CBC: Recent Labs  Lab 03/17/20 0019 03/18/20 0119  WBC 7.6 8.2  HGB 14.3 13.8  HCT 44.7 43.4  MCV 95.3 95.8  PLT 234 275   Basic Metabolic Panel: Recent Labs  Lab 03/17/20 0019 03/18/20 0119 03/19/20 0126 03/20/20 0521  NA 141 138 139  --   K 3.4* 3.5 3.7  --   CL 103 102 102  --   CO2 --   GLUCOSE 103* 117* 90  --   BUN 38* 40* 38*  --   CREATININE 2.08* 1.90* 2.01* 2.14*  CALCIUM 8.5* 8.2* 8.5*  --   MG 2.1  --  2.2  --   PHOS 4.8*  --   --   --    GFR: Estimated Creatinine Clearance: 44.9 mL/min (A) (by C-G formula based on SCr of 2.14 mg/dL (H)). Liver Function Tests: Recent Labs  Lab 03/17/20 0019  AST 34  ALT 26  ALKPHOS 58  BILITOT 0.9  PROT 6.6  ALBUMIN 2.9*   No results for input(s): LIPASE, AMYLASE in the last 168 hours. No results for input(s): AMMONIA in the last 168 hours. Coagulation Profile: No results for input(s): INR, PROTIME in the last 168 hours. Cardiac Enzymes: No results for input(s): CKTOTAL, CKMB, CKMBINDEX, TROPONINI in the last 168 hours. BNP (last 3 results) No results for input(s): PROBNP in the last 8760 hours. HbA1C: No results for input(s): HGBA1C in the last 72 hours. CBG: Recent Labs  Lab 03/22/20 1544 03/22/20 2237 03/23/20 0622 03/23/20 1130 03/23/20 1551  GLUCAP 186* 103* 110* 175* 107*   Lipid Profile: No results for input(s): CHOL, HDL, LDLCALC, TRIG, CHOLHDL, LDLDIRECT in the last 72 hours. Thyroid Function Tests: No  results for input(s): TSH, T4TOTAL, FREET4, T3FREE, THYROIDAB in the last 72 hours. Anemia Panel: No results for input(s): VITAMINB12, FOLATE, FERRITIN, TIBC, IRON, RETICCTPCT in the last 72 hours. Urine analysis:    Component Value Date/Time   COLORURINE STRAW (A) 03/12/2020 0444   APPEARANCEUR CLEAR 03/12/2020 0444   LABSPEC 1.008 03/12/2020 0444   PHURINE 7.0 03/12/2020 0444   GLUCOSEU NEGATIVE 03/12/2020 0444   GLUCOSEU NEGATIVE 05/03/2017 1231   HGBUR MODERATE (A) 03/12/2020 0444   BILIRUBINUR NEGATIVE 03/12/2020 0444   KETONESUR 5 (A) 03/12/2020 0444   PROTEINUR 100 (A) 03/12/2020 0444   UROBILINOGEN 0.2 05/03/2017 1231   NITRITE NEGATIVE 03/12/2020 0444   LEUKOCYTESUR NEGATIVE 03/12/2020 0444   Sepsis Labs: (procalcitonin:4,lacticidven:4)  ) Recent Results (from the past 240 hour(s))  SARS Coronavirus 2 by RT PCR (hospital order, performed in  [facility] hospital lab) Nasopharyngeal Nasopharyngeal Swab     Status: None   Collection Time: 03/23/20 11:45 AM   Specimen: Nasopharyngeal Swab  Result Value Ref Range Status   SARS Coronavirus 2 NEGATIVE NEGATIVE Final    Comment: (NOTE) SARS-CoV-2 target nucleic acids are NOT DETECTED.  The SARS-CoV-2 RNA is generally detectable in upper and lower respiratory specimens during the acute phase of infection. The lowest concentration of SARS-CoV-2 viral copies this assay can detect is 250 copies / mL. A negative result does not preclude SARS-CoV-2 infection and should not be used as the sole basis for treatment or other patient management decisions.  A negative result may occur with improper specimen collection / handling, submission of specimen other than nasopharyngeal swab, presence of viral mutation(s) within the areas targeted by this assay, and inadequate number of viral copies (<250 copies / mL). A negative result must be combined with clinical observations, patient history, and epidemiological  information.  Fact Sheet for Patients:   BoilerBrush.com.cy  Fact Sheet for Healthcare Providers: https://pope.com/  This test is not yet approved or  cleared by the [place] FDA and has been authorized for detection and/or diagnosis of SARS-CoV-2 by FDA under an Emergency Use Authorization (EUA).  This EUA will remain in effect (meaning this test can be used) for the duration of the COVID-19 declaration under Section 564(b)(1) of the Act, 21 U.S.C. section 360bbb-3(b)(1), unless the authorization is terminated or revoked sooner.  Performed at [facility], [address], [place], [place] [address]       Studies: EP PPM/ICD IMPLANT  Result Date: 03/23/2020 SURGEON:  Hillis Range, MD   PREPROCEDURE DIAGNOSIS:  Cryptogenic Stroke   POSTPROCEDURE DIAGNOSIS:  Cryptogenic Stroke    PROCEDURES:  1. Implantable loop recorder implantation   INTRODUCTION:  KOL CONSUEGRA is a 64 y.o. male with a history of unexplained stroke who presents today for implantable loop implantation.  The patient has had a cryptogenic stroke.  Despite an extensive workup by neurology, no reversible causes have been identified.  he has worn telemetry during which he did not have arrhythmias.  There is significant concern for possible atrial fibrillation as the cause for the patients stroke.  The patient therefore presents today for implantable loop implantation.   DESCRIPTION OF PROCEDURE:  Informed written consent was obtained.  The patient required no sedation for the procedure today.  The patients left chest was prepped and draped. Mapping over the patient's chest was performed to identify the appropriate ILR site.  This area was found to be the left  parasternal region over the 3rd-4th intercostal space.  The skin overlying this region was infiltrated with lidocaine for local analgesia.  A 0.5-cm incision was made at the implant site.  A subcutaneous ILR pocket  was fashioned using a combination of sharp and blunt dissection.  A Medtronic Reveal Linq model [id number] implantable loop recorder was then placed into the pocket R waves were very prominent and measured > 0.2 mV. EBL<1 ml.  Steri- Strips and a sterile dressing were then applied.  There were no early apparent complications.   CONCLUSIONS:  1. Successful implantation of a Medtronic Reveal LINQ implantable loop recorder for cryptogenic stroke  2. No early apparent complications. Hillis Range MD, [facility] 03/23/2020 2:58 PM    Scheduled Meds: . amLODipine  10 mg Oral Daily  . aspirin EC  81 mg Oral Daily  . atorvastatin  80 mg Oral Daily  . carvedilol  37.5 mg Oral BID WC  . clopidogrel  75 mg Oral Daily  . furosemide  40 mg Oral Daily  . heparin injection (subcutaneous)  5,000 Units Subcutaneous Q8H  . insulin aspart  0-15 Units Subcutaneous TID AC & HS  . QUEtiapine  12.5 mg Oral QHS  . senna  2 tablet Oral BID  . sodium chloride flush  3 mL Intravenous Q12H    Continuous Infusions: . sodium chloride       LOS: 11 days     Darlin Drop, MD [facility] Pager [phone]  If 7PM-7AM, please contact night-coverage [email or web address] Password [facility] 03/23/2020, 4:34 PM

## 2020-03-23 NOTE — TOC Progression Note (Signed)
Transition of Care (TOC) - Progression Note  Donn Pierini RN, BSN Transitions of Care Unit 4E- RN Case Manager See Treatment Team for direct phone #    Patient Details  Name: Trevor Anderson MRN: [id number] Date of Birth: 01-15-1956  Transition of Care [facility]) CM/SW Contact  Zenda Alpers, Lenn Sink, RN Phone Number: 03/23/2020, 1:45 PM  Clinical Narrative:    [facility]- clinicals sent last week- f/u call made to Jace- per [facility]Fri- [facility] will start auth process today- will need updated clinicals sent. Have asked PT/OT to see pt this am for updated notes- will fax in to [facility] as soon as updated therapy notes available. Per Jace beds are available once [place] auth received. Hopeful by tomorrow.    Expected Discharge Plan: IP Rehab Facility Barriers to Discharge: Continued Medical Work up, SNF Pending bed offer, Inadequate or no insurance  Expected Discharge Plan and Services Expected Discharge Plan: IP Rehab Facility In-house Referral: Clinical Social Work Discharge Planning Services: CM Consult Post Acute Care Choice: IP Rehab   Expected Discharge Date: 03/23/20                                     Social Determinants of Health (SDOH) Interventions    Readmission Risk Interventions No flowsheet data found.

## 2020-03-23 NOTE — TOC Progression Note (Addendum)
Transition of Care [facility]) - Progression Note    Patient Details  Name: Trevor Anderson MRN: [id number] Date of Birth: 31-May-1955  Transition of Care [facility]) CM/SW Contact  Leone Haven, RN Phone Number: 03/23/2020, 12:49 PM  Clinical Narrative:    NCM spoke with Jace at [facility], they are still awaiting on auth from the [place].  He states he thinks he may get it today.   12/1- Per Jace , still [facility]   Expected Discharge Plan: IP Rehab Facility Barriers to Discharge: Continued Medical Work up, SNF Pending bed offer, Inadequate or no insurance  Expected Discharge Plan and Services Expected Discharge Plan: IP Rehab Facility In-house Referral: Clinical Social Work Discharge Planning Services: CM Consult Post Acute Care Choice: IP Rehab   Expected Discharge Date: 03/23/20                                     Social Determinants of Health (SDOH) Interventions    Readmission Risk Interventions No flowsheet data found.

## 2020-03-24 ENCOUNTER — Encounter (HOSPITAL_COMMUNITY): Payer: Self-pay | Admitting: Internal Medicine

## 2020-03-24 DIAGNOSIS — I160 Hypertensive urgency: Secondary | ICD-10-CM

## 2020-03-24 LAB — GLUCOSE, CAPILLARY
Glucose-Capillary: 110 mg/dL — ABNORMAL HIGH (ref 70–99)
Glucose-Capillary: 229 mg/dL — ABNORMAL HIGH (ref 70–99)
Glucose-Capillary: 111 mg/dL — ABNORMAL HIGH (ref 70–99)
Glucose-Capillary: 153 mg/dL — ABNORMAL HIGH (ref 70–99)

## 2020-03-24 NOTE — Progress Notes (Signed)
Physical Therapy Treatment Patient Details Name: Trevor Anderson MRN: [id number] DOB: 1955/12/26 Today's Date: 03/24/2020    History of Present Illness 64 year old male with past medical history of diabetes mellitus type 2, coronary artery disease (cath 2009 with nonobstructive disease), systolic and diastolic congestive heart failure, hyperlipidemia, hypertension and medical noncompliance who presented to [facility] emergency department 11/18 with complaints of shortness of breath. Code stroke called am 11/19 due to acute right sided weakness and dysarthria. MRI with Moderate size acute infarction involving the left basal ganglia.     PT Comments    Pt making steady progress towards his physical therapy goals, remains very motivated to participate. Session focused on static/dynamic sitting balance, pre transfer training, and postural re-education. Pt requiring two person maximal assist for functional mobility. Performing x 4 stands with lift equipment Corene Cornea) with tactile cues for emphasis for midline positioning and upright posture. Remains excellent candidate to for CIR in order to address deficits and maximize functional mobility.     Follow Up Recommendations  CIR     Equipment Recommendations  Wheelchair (measurements PT);Wheelchair cushion (measurements PT);3in1 (PT);Hospital bed    Recommendations for Other Services       Precautions / Restrictions Precautions Precautions: Fall Precaution Comments: R hemiparesis Restrictions Weight Bearing Restrictions: No RUE Weight Bearing: Non weight bearing RLE Weight Bearing: Non weight bearing    Mobility  Bed Mobility Overal bed mobility: Needs Assistance Bed Mobility: Rolling;Sidelying to Sit Rolling: Min assist Sidelying to sit: Max assist;+2 for safety/equipment;HOB elevated       General bed mobility comments: Rolling to right side of bed with cues for technique, maxA + 2 for BLE's off edge of bed and trunk to  upright  Transfers Overall transfer level: Needs assistance Equipment used: Ambulation equipment used Transfers: Sit to/from Stand Sit to Stand: Max assist;+2 physical assistance         General transfer comment: MaxA + 2 to power up from elevated bed surface to [place], facilitation and guarding for RUE. Cues for placing LLE posteriorly for increased leverage. Pt then able to complete x 3 subsequent stands from Long Neck with tactile cues for glute activation, truncal/cervical extension and mildine  Ambulation/Gait                 Stairs             Wheelchair Mobility    Modified Rankin (Stroke Patients Only) Modified Rankin (Stroke Patients Only) Pre-Morbid Rankin Score: No symptoms Modified Rankin: Severe disability     Balance Overall balance assessment: Needs assistance Sitting-balance support: Single extremity supported;Feet supported Sitting balance-Leahy Scale: Poor Sitting balance - Comments: Progressing to min guard assist, reliant on single UE support for majority of time  Postural control: Right lateral lean Standing balance support: Single extremity supported Standing balance-Leahy Scale: Poor                              Cognition Arousal/Alertness: Awake/alert Behavior During Therapy: WFL for tasks assessed/performed;Flat affect Overall Cognitive Status: Difficult to assess Area of Impairment: Following commands;Safety/judgement;Problem solving                       Following Commands: Follows one step commands inconsistently;Follows one step commands with increased time Safety/Judgement: Decreased awareness of safety;Decreased awareness of deficits   Problem Solving: Slow processing;Requires verbal cues General Comments: Pt able to make needs known, string together 2-4  words to make a sentence, flat affect      Exercises Other Exercises Other Exercises: Sitting EOB: lateral leans x 3 to both right and left, cervical  rotation/flexion/extension, dynamic reaching with LUE    General Comments        Pertinent Vitals/Pain Pain Assessment: Faces Faces Pain Scale: Hurts a little bit Pain Location: R upper leg Pain Descriptors / Indicators: Grimacing;Guarding Pain Intervention(s): Limited activity within patient's tolerance;Monitored during session    Home Living                      Prior Function            PT Goals (current goals can now be found in the care plan section) Acute Rehab PT Goals Patient Stated Goal: Patient does acknowledge wanting to move better PT Goal Formulation: With patient/family Time For Goal Achievement: 04/07/20 Potential to Achieve Goals: Good Progress towards PT goals: Progressing toward goals    Frequency    Min 4X/week      PT Plan Current plan remains appropriate    Co-evaluation PT/OT/SLP Co-Evaluation/Treatment: Yes Reason for Co-Treatment: Complexity of the patient's impairments (multi-system involvement);To address functional/ADL transfers;For patient/therapist safety PT goals addressed during session: Mobility/safety with mobility;Balance;Strengthening/ROM        AM-PAC PT "6 Clicks" Mobility   Outcome Measure  Help needed turning from your back to your side while in a flat bed without using bedrails?: A Little Help needed moving from lying on your back to sitting on the side of a flat bed without using bedrails?: Total Help needed moving to and from a bed to a chair (including a wheelchair)?: Total Help needed standing up from a chair using your arms (e.g., wheelchair or bedside chair)?: Total Help needed to walk in hospital room?: Total Help needed climbing 3-5 steps with a railing? : Total 6 Click Score: 8    End of Session Equipment Utilized During Treatment: Gait belt Activity Tolerance: Patient tolerated treatment well Patient left: in chair;with call bell/phone within reach;with chair alarm set Nurse Communication: Mobility  status PT Visit Diagnosis: Unsteadiness on feet (R26.81);Other abnormalities of gait and mobility (R26.89);Muscle weakness (generalized) (M62.81);Hemiplegia and hemiparesis;Pain Hemiplegia - Right/Left: Right Hemiplegia - dominant/non-dominant: Dominant Hemiplegia - caused by: Cerebral infarction     Time: 1027-2536 PT Time Calculation (min) (ACUTE ONLY): 31 min  Charges:  $Therapeutic Activity: 8-22 mins                     Trevor Anderson, PT, DPT Acute Rehabilitation Services Pager [phone] Office [phone]    Trevor Anderson 03/24/2020, 1:14 PM

## 2020-03-24 NOTE — Progress Notes (Signed)
SLP Cancellation Note  Patient Details Name: Trevor Anderson MRN: [id number] DOB: April 12, 1956   Cancelled treatment:       Reason Eval/Treat Not Completed: Patient at procedure or test/unavailable (Pt currently working with PT & OT. SLP will follow up. )  Desmin Daleo I. Vear Clock, MS, CCC-SLP Acute Rehabilitation Services Office number [phone] Pager [phone]  Scheryl Marten 03/24/2020, 11:31 AM

## 2020-03-24 NOTE — Progress Notes (Signed)
  Speech Language Pathology Treatment: Dysphagia;Cognitive-Linquistic  Patient Details Name: Trevor Anderson MRN: [id number] DOB: 05/29/1955 Today's Date: 03/24/2020 Time: 9242-6834 SLP Time Calculation (min) (ACUTE ONLY): 27 min  Assessment / Plan / Recommendation Clinical Impression  Pt was seen for treatment with his son present. Pt's son reported that the pt's speech is improving. Pt, his son, and Tai, RN denied observance of signs of aspiration with p.o. intake. No s/sx of aspiration were noted with solids or liquids. Mastication was [facility] with trials of regular texture solids and mild lingual residue was cleared with a liquid wash. Pt's diet will be advanced to regular texture solids and thin liquids. Pt was re-educated regarding dysarthria and the impact of this on his speech intelligibility. Pt continues to demonstrate impaired coordination of respiration with speech despite improved respiration at baseline. He exhibited a strained vocal quality intermittently due to his inadequate breath support. Pt required frequent cueing for breath support and to use the breath which was taken. He demonstrated improved coordination of respiration with speech when /h/(i) words were utilized. He achieved 60% accuracy with monosyllabic /h/ words increasing to 70% with cues and models. He demonstrated 20% accuracy with bisyllabic /h/ words when models were given. Some generalization was briefly noted during conversation at the end of the session. SLP will continue to follow pt.    HPI HPI: 64 year old male with past medical history of diabetes mellitus type 2, coronary artery disease (cath 2009 with nonobstructive disease), systolic and diastolic congestive heart failure, hyperlipidemia, hypertension and medical noncompliance who presented to [facility] emergency department 11/18 with complaints of shortness of breath. Code stroke called am 11/19 due to acute right sided weakness and dysarthria.  Failed RN  stroke swallow screen. MRI brain:11/22: Moderate size acute infarction involving the left basal ganglia and adjacent white matter. Additional small acute infarcts involving the right corona radiata and basal ganglia and left paramedian cerebellum.      SLP Plan  Continue with current plan of care       Recommendations  Diet recommendations: Regular;Thin liquid Liquids provided via: Cup;Straw Medication Administration: Whole meds with puree Supervision: Staff to assist with self feeding Compensations: Minimize environmental distractions;Monitor for anterior loss;Follow solids with liquid Postural Changes and/or Swallow Maneuvers: Seated upright 90 degrees                Oral Care Recommendations: Oral care BID Follow up Recommendations: Inpatient Rehab SLP Visit Diagnosis: Dysphagia, oropharyngeal phase (R13.12) Plan: Continue with current plan of care       Yanin Muhlestein I. Hardin Negus, [place], [place] Office number [phone] Pager [place] 03/24/2020, 3:31 PM

## 2020-03-24 NOTE — Progress Notes (Signed)
PROGRESS NOTE    Trevor MCSWEENEY  GQB:[id number] DOB: June 14, 1955 DOA: 03/11/2020 PCP: [name], MD   Chief Complaint  Patient presents with  . Shortness of Breath    Brief Narrative:  64 years old male with PMH of DMII,CAD(cath 2009 nonobstructive disease), chronic diastolic CHF(EF 55%),hyperlipidemia, hypertension and medical noncompliance presents in the emergency department withcomplaints of acute shortness of breath.    VQ scan ordered is negative for PE.  And patient was admitted for acute on chronic diastolic heart failure and hypertensive urgency. On 03/12/2020 patient was found to have right-sided weakness and slurred speech.  Code stroke was called, was not deemed a candidate for TPA.  Neurology was consulted.  MRI of the brain showed mild cerebral infarcts with a large left basal ganglion infarct with resultant right hemiplegia.  Therapy evaluations recommended CIR.  Currently waiting for authorization from insurance for CIR placement   Assessment & Plan:   Principal Problem:   Acute on chronic systolic CHF (congestive heart failure) (HCC) Active Problems:   Type 2 diabetes mellitus without complication, with long-term current use of insulin (HCC)   Mixed hyperlipidemia   Coronary artery disease involving native coronary artery of native heart without angina pectoris   Hypertensive urgency   AKI (acute kidney injury) (HCC)   Fall at home, initial encounter   Right hip pain   Hypokalemia   Cerebral thrombosis with cerebral infarction   Cerebrovascular accident (CVA) (HCC)  Right-sided weakness secondary to by cerebral infarct with a large left basal ganglion infarct with resultant right hemiplegia on 03/13/19 21. Neurology consulted and evaluated the patient.  Reviewed the CT angiogram of the head and neck and MRI of the brain. Neurology recommended dual antiplatelet therapy for now.  Continue with aspirin 81 mg daily and Plavix 75 mg daily for 3 weeks followed  by Plavix alone. On Lipitor 80 mg daily. Speech therapy recommended dysphagia 3 diet. TEE was done does not show any embolic source.  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 Bilateral lower extremity duplex negative for DVT Therapy evaluations recommending CIR with 24-hour supervision Currently waiting for CIR placement.    Cryptogenic stroke He be consulted for possible implantable loop recorder placement done on 03/23/2020.  Recommend outpatient follow-up with Dr. [name].    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 Echocardiogram reviewed. VQ scan is negative for PE. Marland Kitchen  Continue with strict intake and output and daily weights. Continue with aspirin, Plavix, Coreg, Lasix and Lipitor.     Hypokalemia Replace    Mild AKI  Baseline creatinine around 1.1 in 2019 currently stabilized at 2.   Hypertensive urgency Resolved.    Type 2 diabetes mellitus Continue sliding scale insulin at this time.    Hyperlipidemia Continue with the Lipitor.    Obstructive sleep apnea Recommend outpatient follow-up with a sleep study.    History of coronary artery disease  Patient currently denies any chest pain at this time. Elevated troponins probably secondary to stroke. Continue with aspirin Plavix at this time.   Restlessness Continue with Seroquel 12.5 mg daily    DVT prophylaxis: (Heparin Code Status: Full code Family Communication: (None at bedside Disposition:   Status is: Inpatient  Remains inpatient appropriate because:Unsafe d/c  plan   Dispo:  Patient From: Home  Planned Disposition: Inpatient Rehab  Expected discharge date: 03/25/20  Medically stable for  discharge: Yes        Consultants: Neurology     Procedures: none.   Antimicrobials: none   Subjective: No  New complaints.   Objective: Vitals:   03/23/20 1936 03/24/20 0427 03/24/20 0748 03/24/20 0853  BP: 122/81 (!) 145/94 126/75   Pulse: 80 86 88 90  Resp: 18 18 20 18   Temp: 99 F (37.2 C) 99.7 F (37.6 C) 98.6 F (37 C) 98.6 F (37 C)  TempSrc: Oral Oral Oral Oral  SpO2: 100% 100% 100%   Weight:  109.1 kg    Height:        Intake/Output Summary (Last 24 hours) at 03/24/2020 1551 Last data filed at 03/24/2020 1342 Gross per 24 hour  Intake 1020 ml  Output 1625 ml  Net -605 ml   Filed Weights   03/22/20 0611 03/23/20 0100 03/24/20 0427  Weight: 106.5 kg 108.5 kg 109.1 kg    Examination:  General exam: Elderly gentleman does not appear to be in any distress Respiratory system: Clear to auscultation. Respiratory effort normal. Cardiovascular system: S1 & S2 heard, RRR. No JVD,  Gastrointestinal system: Abdomen is nondistended, soft and nontender.. Normal bowel sounds heard. Central nervous system: Alert and oriented. No focal neurological deficits. Extremities: Symmetric 5 x 5 power. Skin: No rashes, lesions or ulcers Psychiatry:. Mood & affect appropriate.     Data Reviewed: I have personally reviewed following labs and imaging studies  CBC: Recent Labs  Lab 03/18/20 0119  WBC 8.2  HGB 13.8  HCT 43.4  MCV 95.8  PLT 275    Basic Metabolic Panel: Recent Labs  Lab 03/18/20 0119 03/19/20 0126 03/20/20 0521  NA 138 139  --   K 3.5 3.7  --   CL 102 102  --   CO2 25 27  --   GLUCOSE 117* 90  --   BUN 40* 38*  --   CREATININE 1.90* 2.01* 2.14*  CALCIUM 8.2* 8.5*  --   MG  --  2.2  --     GFR: Estimated Creatinine Clearance: 45 mL/min (A) (by C-G formula based on SCr of 2.14 mg/dL (H)).  Liver Function Tests: No results for input(s): AST, ALT, ALKPHOS, BILITOT, PROT, ALBUMIN in the last 168 hours.  CBG: Recent Labs  Lab  03/23/20 1130 03/23/20 1551 03/23/20 2140 03/24/20 0534 03/24/20 1143  GLUCAP 175* 107* 165* 110* 229*     Recent Results (from the past 240 hour(s))  SARS Coronavirus 2 by RT PCR (hospital order, performed in [facility] hospital lab) Nasopharyngeal Nasopharyngeal Swab     Status: None   Collection Time: 03/23/20 11:45 AM   Specimen: Nasopharyngeal Swab  Result Value Ref Range Status   SARS Coronavirus 2 NEGATIVE NEGATIVE Final    Comment: (NOTE) SARS-CoV-2 target nucleic acids are NOT DETECTED.  The SARS-CoV-2 RNA is generally detectable in upper and lower respiratory specimens during the acute phase of infection. The lowest concentration of SARS-CoV-2 viral copies this assay can detect is 250 copies / mL. A negative result does not preclude SARS-CoV-2 infection and should not be used as the sole basis for treatment or other patient management decisions.  A negative result may occur with improper specimen collection / handling, submission of specimen other than nasopharyngeal swab, presence of viral mutation(s) within the areas targeted by this assay, and inadequate number of  viral copies (<250 copies / mL). A negative result must be combined with clinical observations, patient history, and epidemiological information.  Fact Sheet for Patients:   BoilerBrush.com.cy  Fact Sheet for Healthcare Providers: https://pope.com/  This test is not yet approved or  cleared by the [place] FDA and has been authorized for detection and/or diagnosis of SARS-CoV-2 by FDA under an Emergency Use Authorization (EUA).  This EUA will remain in effect (meaning this test can be used) for the duration of the COVID-19 declaration under Section 564(b)(1) of the Act, 21 U.S.C. section 360bbb-3(b)(1), unless the authorization is terminated or revoked sooner.  Performed at [facility], [address] [place], [place] [address]           Radiology Studies: EP PPM/ICD IMPLANT  Result Date: 03/23/2020 SURGEON:  Hillis Range, MD   PREPROCEDURE DIAGNOSIS:  Cryptogenic Stroke   POSTPROCEDURE DIAGNOSIS:  Cryptogenic Stroke    PROCEDURES:  1. Implantable loop recorder implantation   INTRODUCTION:  KAIAN FAHS is a 64 y.o. male with a history of unexplained stroke who presents today for implantable loop implantation.  The patient has had a cryptogenic stroke.  Despite an extensive workup by neurology, no reversible causes have been identified.  he has worn telemetry during which he did not have arrhythmias.  There is significant concern for possible atrial fibrillation as the cause for the patients stroke.  The patient therefore presents today for implantable loop implantation.   DESCRIPTION OF PROCEDURE:  Informed written consent was obtained.  The patient required no sedation for the procedure today.  The patients left chest was prepped and draped. Mapping over the patient's chest was performed to identify the appropriate ILR site.  This area was found to be the left  parasternal region over the 3rd-4th intercostal space.  The skin overlying this region was infiltrated with lidocaine for local analgesia.  A 0.5-cm incision was made at the implant site.  A subcutaneous ILR pocket was fashioned using a combination of sharp and blunt dissection.  A Medtronic Reveal Linq model [id number] implantable loop recorder was then placed into the pocket R waves were very prominent and measured > 0.2 mV. EBL<1 ml.  Steri- Strips and a sterile dressing were then applied.  There were no early apparent complications.   CONCLUSIONS:  1. Successful implantation of a Medtronic Reveal LINQ implantable loop recorder for cryptogenic stroke  2. No early apparent complications. Hillis Range MD, [facility] 03/23/2020 2:58 PM        Scheduled Meds: . amLODipine  10 mg Oral Daily  . aspirin EC  81 mg Oral Daily  . atorvastatin  80 mg Oral Daily  . carvedilol  37.5  mg Oral BID WC  . clopidogrel  75 mg Oral Daily  . furosemide  40 mg Oral Daily  . heparin injection (subcutaneous)  5,000 Units Subcutaneous Q8H  . insulin aspart  0-15 Units Subcutaneous TID AC & HS  . QUEtiapine  12.5 mg Oral QHS  . senna  2 tablet Oral BID  . sodium chloride flush  3 mL Intravenous Q12H   Continuous Infusions: . sodium chloride       LOS: 12 days        Kathlen Mody, MD [facility]   To contact the attending provider between 7A-7P or the covering provider during after hours 7P-7A, please log into the web site [email or web address] and access using universal [place] password for that web site. If you do not  have the password, please call the hospital operator.  03/24/2020, 3:51 PM

## 2020-03-24 NOTE — Progress Notes (Signed)
Occupational Therapy Treatment Patient Details Name: Trevor Anderson MRN: [id number] DOB: 08-28-1955 Today's Date: 03/24/2020    History of present illness 64 year old male with past medical history of diabetes mellitus type 2, coronary artery disease (cath 2009 with nonobstructive disease), systolic and diastolic congestive heart failure, hyperlipidemia, hypertension and medical noncompliance who presented to [facility] emergency department 11/18 with complaints of shortness of breath. Code stroke called am 11/19 due to acute right sided weakness and dysarthria. MRI with Moderate size acute infarction involving the left basal ganglia.    OT comments  Pt continues to progressing towards established OT goals and continues to be motivation to participate in therapy and improve his mobility. Pt is in bed, acknowledges he is tired, but wants to move. Pt requiring Max A +2 and stedy to perform sit<>stand for transfer to recliner. Pt continues to present with R lateral lean and requiring cues for postural corrections. Requiring Mod-Max A +2 for standing balance. Continue to recommend dc to CIR and will continue to follow in the acute setting.    Follow Up Recommendations  CIR;Supervision/Assistance - 24 hour    Equipment Recommendations  Wheelchair (measurements OT);Wheelchair cushion (measurements OT)    Recommendations for Other Services      Precautions / Restrictions Precautions Precautions: Fall Precaution Comments: R hemiparesis Restrictions RUE Weight Bearing: Non weight bearing RLE Weight Bearing: Non weight bearing       Mobility Bed Mobility   Bed Mobility: Rolling;Sidelying to Sit Rolling: Min assist Sidelying to sit: Max assist;+2 for safety/equipment;HOB elevated          Transfers Overall transfer level: Needs assistance   Transfers: Sit to/from Stand Sit to Stand: Max assist;+2 physical assistance              Balance Overall balance assessment:  Needs assistance Sitting-balance support: Single extremity supported;Feet supported Sitting balance-Leahy Scale: Poor   Postural control: Right lateral lean Standing balance support: Single extremity supported Standing balance-Leahy Scale: Poor                             ADL either performed or assessed with clinical judgement   ADL Overall ADL's : Needs assistance/impaired     Grooming: Wash/dry face;Set up;Wash/dry hands;Moderate assistance                   Toilet Transfer: +2 for physical assistance;+2 for safety/equipment;Maximal assistance;Stand-pivot;BSC Toilet Transfer Details (indicate cue type and reason): Max A +2 for power up into standing with use of sara stedy. Transfer to/from [facility] Toileting- Architect and Hygiene: Total assistance;+2 for physical assistance;+2 for safety/equipment;Sit to/from stand Toileting - Clothing Manipulation Details (indicate cue type and reason): Total A for peri care. Mod-Max A +2 for standing balance     Functional mobility during ADLs: Maximal assistance;+2 for physical assistance;+2 for safety/equipment                       General Comments  VSS    Pertinent Vitals/ Pain       Pain Assessment: Faces Faces Pain Scale: Hurts a little bit Pain Location: R upper leg Pain Descriptors / Indicators: Grimacing;Guarding Pain Intervention(s): Monitored during session  Frequency  Min 2X/week        Progress Toward Goals  OT Goals(current goals can now be found in the care plan section)  Progress towards OT goals: Progressing toward goals  Acute Rehab OT Goals Patient Stated Goal: Patient does acknowledge wanting to move better OT Goal Formulation: With patient Time For Goal Achievement: 03/27/20 Potential to Achieve Goals: Fair  Plan Discharge plan remains appropriate    Co-evaluation                  AM-PAC OT "6 Clicks" Daily Activity     Outcome Measure   Help from another person eating meals?: A Lot Help from another person taking care of personal grooming?: A Lot Help from another person toileting, which includes using toliet, bedpan, or urinal?: Total Help from another person bathing (including washing, rinsing, drying)?: A Lot Help from another person to put on and taking off regular upper body clothing?: A Lot Help from another person to put on and taking off regular lower body clothing?: Total 6 Click Score: 10    End of Session Equipment Utilized During Treatment: Gait belt;Other (comment)  OT Visit Diagnosis: Other abnormalities of gait and mobility (R26.89);Muscle weakness (generalized) (M62.81);Feeding difficulties (R63.3);Hemiplegia and hemiparesis;Pain Hemiplegia - Right/Left: Right Hemiplegia - dominant/non-dominant: Dominant Hemiplegia - caused by: Cerebral infarction Pain - Right/Left: Right Pain - part of body: Leg   Activity Tolerance Patient tolerated treatment well   Patient Left with call bell/phone within reach;in chair;with chair alarm set   Nurse Communication          Time: 8413-2440 OT Time Calculation (min): 28 min  Charges: OT General Charges $OT Visit: 1 Visit OT Treatments $Self Care/Home Management : 8-22 mins  03/24/2020  Rich, OTR/L  Acute Rehabilitation Services  Office:  [phone]    Suzanna Obey 03/24/2020, 11:52 AM

## 2020-03-25 DIAGNOSIS — E119 Type 2 diabetes mellitus without complications: Secondary | ICD-10-CM

## 2020-03-25 DIAGNOSIS — Z794 Long term (current) use of insulin: Secondary | ICD-10-CM

## 2020-03-25 DIAGNOSIS — E876 Hypokalemia: Secondary | ICD-10-CM

## 2020-03-25 LAB — GLUCOSE, CAPILLARY
Glucose-Capillary: 111 mg/dL — ABNORMAL HIGH (ref 70–99)
Glucose-Capillary: 171 mg/dL — ABNORMAL HIGH (ref 70–99)
Glucose-Capillary: 177 mg/dL — ABNORMAL HIGH (ref 70–99)
Glucose-Capillary: 222 mg/dL — ABNORMAL HIGH (ref 70–99)

## 2020-03-25 NOTE — TOC Progression Note (Signed)
Transition of Care [facility]) - Progression Note    Patient Details  Name: Trevor Anderson MRN: [id number] Date of Birth: December 03, 1955  Transition of [facility]) CM/SW Contact  Leone Haven, RN Phone Number: 03/25/2020, 9:07 AM  Clinical Narrative:    NCM received call from [facility] with [facility] CIR , she states she has not heard anything yet but will update this NCM when she finds out about authorization.   Expected Discharge Plan: IP Rehab Facility Barriers to Discharge: Continued Medical Work up, SNF Pending bed offer, Inadequate or no insurance  Expected Discharge Plan and Services Expected Discharge Plan: IP Rehab Facility In-house Referral: Clinical Social Work Discharge Planning Services: CM Consult Post Acute Care Choice: IP Rehab   Expected Discharge Date: 03/23/20                                     Social Determinants of Health (SDOH) Interventions    Readmission Risk Interventions No flowsheet data found.

## 2020-03-25 NOTE — Progress Notes (Signed)
PROGRESS NOTE    Trevor Anderson  NFA:[id number] DOB: 06-Sep-1955 DOA: 03/11/2020 PCP: [name], MD   Chief Complaint  Patient presents with  . Shortness of Breath    Brief Narrative:  64 years old male with PMH of DMII,CAD(cath 2009 nonobstructive disease), chronic diastolic CHF(EF 55%),hyperlipidemia, hypertension and medical noncompliance presents in the emergency department withcomplaints of acute shortness of breath.    VQ scan ordered is negative for PE.  And patient was admitted for acute on chronic diastolic heart failure and hypertensive urgency. On 03/12/2020 patient was found to have right-sided weakness and slurred speech.  Code stroke was called, was not deemed a candidate for TPA.  Neurology was consulted.  MRI of the brain showed mild cerebral infarcts with a large left basal ganglion infarct with resultant right hemiplegia.  Therapy evaluations recommended CIR.  Currently waiting for authorization from insurance for CIR placement. No changes today patient seen and examined.  Patient currently denies any new complaints . no changes in the plan   Assessment & Plan:   Principal Problem:   Acute on chronic systolic CHF (congestive heart failure) (HCC) Active Problems:   Type 2 diabetes mellitus without complication, with long-term current use of insulin (HCC)   Mixed hyperlipidemia   Coronary artery disease involving native coronary artery of native heart without angina pectoris   Hypertensive urgency   AKI (acute kidney injury) (HCC)   Fall at home, initial encounter   Right hip pain   Hypokalemia   Cerebral thrombosis with cerebral infarction   Cerebrovascular accident (CVA) (HCC)  Right-sided weakness secondary to by cerebral infarct with a large left basal ganglion infarct with resultant right hemiplegia on 03/13/19 21. Neurology consulted and evaluated the patient.  Reviewed the CT angiogram of the head and neck and MRI of the brain. Neurology recommended  dual antiplatelet therapy for now.  Continue with aspirin 81 mg daily and Plavix 75 mg daily for 3 weeks followed by Plavix alone. On Lipitor 80 mg daily. Speech therapy recommended dysphagia 3 diet. TEE was done does not show any embolic source.  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 Bilateral lower extremity duplex negative for DVT Therapy evaluations recommending CIR with 24-hour supervision Currently waiting for CIR placement.    Cryptogenic stroke He be consulted for possible implantable loop recorder placement done on 03/23/2020.  Recommend outpatient follow-up with Dr. [name].    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 Echocardiogram reviewed. VQ scan is negative for PE. Continue with strict intake and output and daily weights Continue with aspirin, Plavix, Coreg, Lasix and Lipitor.     Hypokalemia Replaced    Mild AKI Baseline creatinine around 1.1 in 2019 currently stabilized at 2.  Recheck BMP in the morning   Hypertensive urgency Resolved.    Type 2 diabetes mellitus controlled  CBG (last 3)  Recent Labs    03/24/20 2146 03/25/20 0659 03/25/20 1144  GLUCAP 153* 111* 171*   Continue sliding scale insulin.    Hyperlipidemia Continue with the Lipitor.    Obstructive sleep apnea Recommend outpatient follow-up with a sleep study.    History of coronary artery disease  Patient currently denies any chest pain at this time. Elevated troponins probably secondary to  stroke. Continue with aspirin Plavix at this time.   Restlessness Continue with Seroquel 12.5 mg daily    DVT prophylaxis: (Heparin Code Status: Full code Family Communication: None at  bedside Disposition:   Status is: Inpatient  Remains inpatient appropriate because:Unsafe d/c plan   Dispo:  Patient From: Home  Planned Disposition: Inpatient Rehab  Expected discharge date: 03/26/20  Medically stable for discharge: Yes        Consultants: Neurology     Procedures: none.   Antimicrobials: none   Subjective: No new complaints at this time  Objective: Vitals:   03/24/20 2027 03/25/20 0528 03/25/20 0906 03/25/20 1146  BP: 112/81 124/89 110/78 113/81  Pulse: 81 88 84 83  Resp: 18 20 (!) 22 (!) 22  Temp: 98.2 F (36.8 C) 98.5 F (36.9 C) 99.8 F (37.7 C) 98.7 F (37.1 C)  TempSrc: Oral Oral Oral Oral  SpO2: 98% 95% 95% 100%  Weight:  109.4 kg    Height:        Intake/Output Summary (Last 24 hours) at 03/25/2020 1603 Last data filed at 03/25/2020 1503 Gross per 24 hour  Intake 480 ml  Output 1500 ml  Net -1020 ml   Filed Weights   03/23/20 0100 03/24/20 0427 03/25/20 0528  Weight: 108.5 kg 109.1 kg 109.4 kg    Examination:  General exam: Elderly gentleman, comfortable no distress noted Respiratory system: Clear to auscultation bilateral, no wheezing or rhonchi Cardiovascular system: S1-S2 heard, regular rate rhythm, no JVD Gastrointestinal system: Abdomen is soft, nontender, bowel sounds normal Central nervous system: Alert and oriented, grossly nonfocal. Extremities: No cyanosis, no clubbing Skin: No rashes seen Psychiatry:.  Mood is appropriate   Data Reviewed: I have personally reviewed following labs and imaging studies  CBC: No results for input(s): WBC, NEUTROABS, HGB, HCT, MCV, PLT in the last 168 hours.  Basic Metabolic Panel: Recent Labs  Lab 03/19/20 0126 03/20/20 0521  NA 139  --   K 3.7  --   CL 102  --   CO2 27  --   GLUCOSE 90  --   BUN 38*  --   CREATININE 2.01* 2.14*  CALCIUM 8.5*  --   MG 2.2  --     GFR: Estimated Creatinine Clearance: 45 mL/min (A) (by C-G formula based on SCr of 2.14 mg/dL  (H)).  Liver Function Tests: No results for input(s): AST, ALT, ALKPHOS, BILITOT, PROT, ALBUMIN in the last 168 hours.  CBG: Recent Labs  Lab 03/24/20 1143 03/24/20 1637 03/24/20 2146 03/25/20 0659 03/25/20 1144  GLUCAP 229* 111* 153* 111* 171*     Recent Results (from the past 240 hour(s))  SARS Coronavirus 2 by RT PCR (hospital order, performed in [facility] hospital lab) Nasopharyngeal Nasopharyngeal Swab     Status: None   Collection Time: 03/23/20 11:45 AM   Specimen: Nasopharyngeal Swab  Result Value Ref Range Status   SARS Coronavirus 2 NEGATIVE NEGATIVE Final    Comment: (NOTE) SARS-CoV-2 target nucleic acids are NOT DETECTED.  The SARS-CoV-2 RNA is generally detectable in upper and lower respiratory specimens during the acute phase of infection. The lowest concentration of SARS-CoV-2 viral copies this assay can detect is 250 copies / mL. A negative result does not preclude SARS-CoV-2 infection and should not be used as the sole basis for treatment or other patient management decisions.  A negative result may occur with improper specimen collection / handling, submission of specimen other than nasopharyngeal swab, presence of viral  mutation(s) within the areas targeted by this assay, and inadequate number of viral copies (<250 copies / mL). A negative result must be combined with clinical observations, patient history, and epidemiological information.  Fact Sheet for Patients:   BoilerBrush.com.cy  Fact Sheet for Healthcare Providers: https://pope.com/  This test is not yet approved or  cleared by the [place] FDA and has been authorized for detection and/or diagnosis of SARS-CoV-2 by FDA under an Emergency Use Authorization (EUA).  This EUA will remain in effect (meaning this test can be used) for the duration of the COVID-19 declaration under Section 564(b)(1) of the Act, 21 U.S.C. section 360bbb-3(b)(1),  unless the authorization is terminated or revoked sooner.  Performed at [facility], [address] [place], [place] [address]          Radiology Studies: No results found.      Scheduled Meds: . amLODipine  10 mg Oral Daily  . aspirin EC  81 mg Oral Daily  . atorvastatin  80 mg Oral Daily  . carvedilol  37.5 mg Oral BID WC  . clopidogrel  75 mg Oral Daily  . furosemide  40 mg Oral Daily  . heparin injection (subcutaneous)  5,000 Units Subcutaneous Q8H  . insulin aspart  0-15 Units Subcutaneous TID AC & HS  . QUEtiapine  12.5 mg Oral QHS  . senna  2 tablet Oral BID  . sodium chloride flush  3 mL Intravenous Q12H   Continuous Infusions: . sodium chloride       LOS: 13 days        Kathlen Mody, MD [facility]   To contact the attending provider between 7A-7P or the covering provider during after hours 7P-7A, please log into the web site [email or web address] and access using universal [place] password for that web site. If you do not have the password, please call the hospital operator.  03/25/2020, 4:03 PM

## 2020-03-25 NOTE — Plan of Care (Signed)
°  Problem: Clinical Measurements: °Goal: Will remain free from infection °Outcome: Progressing °Goal: Respiratory complications will improve °Outcome: Progressing °  °

## 2020-03-25 NOTE — Progress Notes (Signed)
Physical Therapy Treatment Patient Details Name: Trevor Anderson MRN: [id number] DOB: 1956-02-28 Today's Date: 03/25/2020    History of Present Illness 64 year old male with past medical history of diabetes mellitus type 2, coronary artery disease (cath 2009 with nonobstructive disease), systolic and diastolic congestive heart failure, hyperlipidemia, hypertension and medical noncompliance who presented to [facility] emergency department 11/18 with complaints of shortness of breath. Code stroke called am 11/19 due to acute right sided weakness and dysarthria. MRI with Moderate size acute infarction involving the left basal ganglia.     PT Comments    Patient with increased lethargy this session which limited progression. Patient continues to require maxA+2 for sit to stand transfer x 2 with STEDY, verbal and tactile cues required to maintain midline with intermittent follow through. Patient maxA+2 for supine to sit and totalA+2 for sit>supine. Continue to recommend comprehensive inpatient rehab (CIR) for post-acute therapy needs.    Follow Up Recommendations  CIR     Equipment Recommendations  Wheelchair (measurements PT);Wheelchair cushion (measurements PT);3in1 (PT);Hospital bed    Recommendations for Other Services       Precautions / Restrictions Precautions Precautions: Fall Precaution Comments: R hemiparesis Restrictions Weight Bearing Restrictions: Yes RUE Weight Bearing: Non weight bearing RLE Weight Bearing: Non weight bearing Other Position/Activity Restrictions: R sided hemiparesis.    Mobility  Bed Mobility Overal bed mobility: Needs Assistance Bed Mobility: Rolling;Sidelying to Sit Rolling: Min assist   Supine to sit: Max assist;+2 for physical assistance;+2 for safety/equipment Sit to supine: Total assist;+2 for physical assistance;+2 for safety/equipment   General bed mobility comments: Rolling to R with minA. assist required for B LE advancement on/off  bed and trunk upright   Transfers Overall transfer level: Needs assistance Equipment used: Ambulation equipment used Transfers: Sit to/from Stand Sit to Stand: Max assist;+2 physical assistance         General transfer comment: maxA+2 to power up from elevated bed surface, cues for glute activation and bringing chest forward. Assist for R UE   Ambulation/Gait                 Stairs             Wheelchair Mobility    Modified Rankin (Stroke Patients Only) Modified Rankin (Stroke Patients Only) Pre-Morbid Rankin Score: No symptoms Modified Rankin: Severe disability     Balance Overall balance assessment: Needs assistance Sitting-balance support: Single extremity supported;Feet supported Sitting balance-Leahy Scale: Poor Sitting balance - Comments: modA initially with no UE support, minA-min guard with single UE support Postural control: Right lateral lean Standing balance support: Single extremity supported Standing balance-Leahy Scale: Poor                              Cognition Arousal/Alertness: Awake/alert Behavior During Therapy: WFL for tasks assessed/performed;Flat affect Overall Cognitive Status: Difficult to assess Area of Impairment: Following commands;Safety/judgement;Problem solving                       Following Commands: Follows one step commands inconsistently;Follows one step commands with increased time Safety/Judgement: Decreased awareness of safety;Decreased awareness of deficits   Problem Solving: Slow processing;Requires verbal cues        Exercises      General Comments        Pertinent Vitals/Pain Pain Assessment: Faces Faces Pain Scale: No hurt    Home Living  Prior Function            PT Goals (current goals can now be found in the care plan section) Acute Rehab PT Goals Patient Stated Goal: Patient does acknowledge wanting to move better PT Goal Formulation:  With patient/family Time For Goal Achievement: 04/07/20 Potential to Achieve Goals: Good Progress towards PT goals: Progressing toward goals    Frequency    Min 4X/week      PT Plan Current plan remains appropriate    Co-evaluation              AM-PAC PT "6 Clicks" Mobility   Outcome Measure  Help needed turning from your back to your side while in a flat bed without using bedrails?: A Little Help needed moving from lying on your back to sitting on the side of a flat bed without using bedrails?: A Lot Help needed moving to and from a bed to a chair (including a wheelchair)?: A Lot Help needed standing up from a chair using your arms (e.g., wheelchair or bedside chair)?: A Lot Help needed to walk in hospital room?: Total Help needed climbing 3-5 steps with a railing? : Total 6 Click Score: 11    End of Session Equipment Utilized During Treatment: Gait belt Activity Tolerance: Patient limited by lethargy Patient left: in bed;with call bell/phone within reach;with bed alarm set Nurse Communication: Mobility status PT Visit Diagnosis: Unsteadiness on feet (R26.81);Other abnormalities of gait and mobility (R26.89);Muscle weakness (generalized) (M62.81);Hemiplegia and hemiparesis;Pain Hemiplegia - Right/Left: Right Hemiplegia - dominant/non-dominant: Dominant Hemiplegia - caused by: Cerebral infarction     Time: 1403-1430 PT Time Calculation (min) (ACUTE ONLY): 27 min  Charges:  $Therapeutic Activity: 23-37 mins                     Gregor Hams, PT, DPT Acute Rehabilitation Services Pager [phone] Office [phone]    Trevor Anderson 03/25/2020, 2:47 PM

## 2020-03-25 NOTE — Progress Notes (Signed)
°  Speech Language Pathology Treatment: Dysphagia;Cognitive-Linquistic (Dysarthria) Patient Details Name: Trevor Anderson MRN: [id number] DOB: [birth year] Today's Date: 03/25/2020 Time: 9767-3419 SLP Time Calculation (min) (ACUTE ONLY): 25 min  Assessment / Plan / Recommendation Clinical Impression  Pt was seen for dysphagia treatment and was cooperative during the session. Pt's sister was present and she was educated regarding the pt's progress and impairments. She verbalized understanding. Pt tolerated regular texture solids and thin liquids without symptoms of oropharyngeal dysphagia. Ryan, RN denied difficulty with pills and pt's family denied signs of aspiration with lunch. Pt may continue regular texture solids and thin liquids. Further services are not warranted for swallowing at this time. Pt was educated regarding diaphragmatic breathing and re-educated on use of breath support. Pt was noted to frequently inhale adequately but then exhale prior to speaking and exhibit glottal fry. He demonstrated adequate coordination of respiration with speech with 60% accuracy during production of /h/ bisyllabic words increasing to 90% with cues and models. Generalization continues to be noted to conversation and improved breath support and speech intelligibility were noted at the end of the session. SLP will continue to follow pt.    HPI HPI: 64 year old male with past medical history of diabetes mellitus type 2, coronary artery disease (cath 2009 with nonobstructive disease), systolic and diastolic congestive heart failure, hyperlipidemia, hypertension and medical noncompliance who presented to [facility] emergency department 11/18 with complaints of shortness of breath. Code stroke called am 11/19 due to acute right sided weakness and dysarthria.  Failed RN stroke swallow screen. MRI brain:11/22: Moderate size acute infarction involving the left basal ganglia and adjacent white matter. Additional small  acute infarcts involving the right corona radiata and basal ganglia and left paramedian cerebellum.      SLP Plan  Continue with current plan of care;Goals updated (d/c swallowing goals. )       Recommendations  Diet recommendations: Regular;Thin liquid Liquids provided via: Cup;Straw Medication Administration: Whole meds with puree Supervision: Staff to assist with self feeding Compensations: Minimize environmental distractions;Monitor for anterior loss;Follow solids with liquid Postural Changes and/or Swallow Maneuvers: Seated upright 90 degrees                Oral Care Recommendations: Oral care BID Follow up Recommendations: Inpatient Rehab SLP Visit Diagnosis: Dysphagia, oropharyngeal phase (R13.12) Plan: Continue with current plan of care;Goals updated (d/c swallowing goals. )       Shannie Kontos I. Vear Clock, MS, CCC-SLP Acute Rehabilitation Services Office number [phone] Pager [phone]                 Scheryl Marten 03/25/2020, 3:28 PM

## 2020-03-26 LAB — GLUCOSE, CAPILLARY
Glucose-Capillary: 100 mg/dL — ABNORMAL HIGH (ref 70–99)
Glucose-Capillary: 134 mg/dL — ABNORMAL HIGH (ref 70–99)
Glucose-Capillary: 137 mg/dL — ABNORMAL HIGH (ref 70–99)
Glucose-Capillary: 123 mg/dL — ABNORMAL HIGH (ref 70–99)

## 2020-03-26 NOTE — Progress Notes (Addendum)
Patient's sister Mrs. Birdie Riddle called last night requesting to speak with her brother's care team about discharge planning for the patient. She plans to visit this morning and wants to speak with someone. I informed her I would relay the message. Her number is [phone]  Continued stroke education provided to family as well as the patient.

## 2020-03-26 NOTE — Progress Notes (Signed)
    Durable Medical Equipment  (From admission, onward)         Start     Ordered   03/26/20 1648  For home use only DME lightweight manual wheelchair with seat cushion  Once       Comments: Patient suffers from CVA which impairs their ability to perform daily activities like dressing and bathing in the home.  A walker or cane will not resolve  issue with performing activities of daily living. A wheelchair will allow patient to safely perform daily activities. Patient is not able to propel themselves in the home using a standard weight wheelchair due to wekaness. Patient can self propel in the lightweight wheelchair. Length of need lifetime. Accessories: elevating leg rests (ELRs), wheel locks, extensions and anti-tippers.   03/26/20 1648   03/19/20 1429  For home use only DME 3 n 1  Once        03/19/20 1429   03/19/20 1429  For home use only DME Hospital bed  Once       Question Answer Comment  Length of Need 6 Months   The above medical condition requires: Patient requires the ability to reposition frequently   Head must be elevated greater than: 30 degrees   Bed type Semi-electric   Hoyer Lift Yes      03/19/20 1429   03/19/20 1427  For home use only DME lightweight manual wheelchair with seat cushion  Once       Comments: Patient suffers from ambulatory dysfunction which impairs their ability to perform daily activities like activity of daily living in the home.  A walker will not resolve issue with performing activities of daily living. A wheelchair will allow patient to safely perform daily activities. Patient is not able to propel themselves in the home using a standard weight wheelchair due to right-sided weakness patient can self propel in the lightweight wheelchair. Length of need 6 months. Accessories: elevating leg rests, wheel locks, extensions and anti-tippers.   03/19/20 1428

## 2020-03-26 NOTE — TOC Progression Note (Addendum)
Transition of Care [facility]) - Progression Note    Patient Details  Name: Trevor Anderson MRN: [id number] Date of Birth: 30-Oct-1955  Transition of Care [facility]) CM/SW Contact  Leone Haven, RN Phone Number: 03/26/2020, 1:04 PM  Clinical Narrative:    NCM received call from [place] at [facility], she states the [place] states they can not approve CIR for patient because he has not seen his PCP in over 2 years there.  Notified MD.   Expected Discharge Plan: IP Rehab Facility Barriers to Discharge: Continued Medical Work up, SNF Pending bed offer, Inadequate or no insurance  Expected Discharge Plan and Services Expected Discharge Plan: IP Rehab Facility In-house Referral: Clinical Social Work Discharge Planning Services: CM Consult Post Acute Care Choice: IP Rehab   Expected Discharge Date: 03/23/20                                     Social Determinants of Health (SDOH) Interventions    Readmission Risk Interventions No flowsheet data found.

## 2020-03-26 NOTE — Plan of Care (Signed)
  Problem: Cardiac: Goal: Ability to achieve and maintain adequate cardiopulmonary perfusion will improve Outcome: Progressing   

## 2020-03-26 NOTE — Progress Notes (Signed)
PROGRESS NOTE    Trevor Anderson  DPO:[id number] DOB: [birth year] DOA: 03/11/2020 PCP: [name], MD   Chief Complaint  Patient presents with  . Shortness of Breath    Brief Narrative:  64 years old male with PMH of DMII,CAD(cath 2009 nonobstructive disease), chronic diastolic CHF(EF 55%),hyperlipidemia, hypertension and medical noncompliance presents in the emergency department withcomplaints of acute shortness of breath.    VQ scan ordered is negative for PE.  And patient was admitted for acute on chronic diastolic heart failure and hypertensive urgency. On 03/12/2020 patient was found to have right-sided weakness and slurred speech.  Code stroke was called, was not deemed a candidate for TPA.  Neurology was consulted.  MRI of the brain showed mild cerebral infarcts with a large left basal ganglion infarct with resultant right hemiplegia.  Therapy evaluations recommended CIR. [facility] as pt hasn't seen [facility] physician in [facility] years. Tried calling his sister and son , but calls going to voicemail.     Assessment & Plan:   Principal Problem:   Acute on chronic systolic CHF (congestive heart failure) (HCC) Active Problems:   Type 2 diabetes mellitus without complication, with long-term current use of insulin (HCC)   Mixed hyperlipidemia   Coronary artery disease involving native coronary artery of native heart without angina pectoris   Hypertensive urgency   AKI (acute kidney injury) (HCC)   Fall at home, initial encounter   Right hip pain   Hypokalemia   Cerebral thrombosis with cerebral infarction   Cerebrovascular accident (CVA) (HCC)  Right-sided weakness secondary to by cerebral infarct with a large left basal ganglion infarct with resultant right hemiplegia on 03/13/19 21. Neurology consulted and evaluated the patient.  Reviewed the CT angiogram of the head and neck and MRI of the brain. Neurology recommended dual antiplatelet therapy for now.   Continue with aspirin 81 mg daily and Plavix 75 mg daily for 3 weeks followed by Plavix alone. On Lipitor 80 mg daily. Speech therapy recommended dysphagia 3 diet. TEE was done does not show any embolic source.  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 Bilateral lower extremity duplex negative for DVT Therapy evaluations recommending CIR with 24-hour supervision Currently waiting for CIR placement.    Cryptogenic stroke He be consulted for possible implantable loop recorder placement done on 03/23/2020.  Recommend outpatient follow-up with Dr. [name].    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 Echocardiogram reviewed. VQ scan is negative for PE. Continue with strict intake and output and daily weights Continue with aspirin, Plavix, Coreg, Lasix and Lipitor.     Hypokalemia Replaced    Mild AKI Baseline creatinine around 1.1 in 2019 currently stabilized at 2.  Recheck BMP in the morning   Hypertensive urgency Resolved.    Type 2 diabetes mellitus controlled  CBG (last 3)  Recent Labs    03/26/20 0638 03/26/20 1108 03/26/20 1649  GLUCAP 100* 134* 137*   Continue sliding scale insulin.    Hyperlipidemia Continue with the Lipitor.    Obstructive sleep apnea Recommend outpatient follow-up with a sleep study.    History of coronary artery disease  Patient currently denies any chest pain at this time. Elevated troponins probably secondary to stroke. Continue  with aspirin Plavix at this time.   Restlessness Continue with Seroquel 12.5 mg daily    DVT prophylaxis: (Heparin Code Status: Full code Family Communication: None at bedside Disposition:   Status  is: Inpatient  Remains inpatient appropriate because:Unsafe d/c plan   Dispo:  Patient From: Home  Planned Disposition: Inpatient Rehab  Expected discharge date: 03/26/20  Medically stable for discharge: Yes        Consultants: Neurology     Procedures: none.   Antimicrobials: none   Subjective: No new complaints.   Objective: Vitals:   03/25/20 2132 03/26/20 0347 03/26/20 1106 03/26/20 1651  BP: 109/68 116/62 117/81 119/82  Pulse: 78  77 77  Resp: 20 20 16 16   Temp: 98.7 F (37.1 C) 98.7 F (37.1 C) 98.8 F (37.1 C) 98.2 F (36.8 C)  TempSrc: Oral Oral Oral Oral  SpO2: 100% 100% 96%   Weight:  107.8 kg    Height:        Intake/Output Summary (Last 24 hours) at 03/26/2020 1738 Last data filed at 03/26/2020 1304 Gross per 24 hour  Intake 1197 ml  Output 600 ml  Net 597 ml   Filed Weights   03/24/20 0427 03/25/20 0528 03/26/20 0347  Weight: 109.1 kg 109.4 kg 107.8 kg    Examination:  General exam: Elderly gentleman.  Respiratory system:clear to auscultation, no wheezing heard.  Cardiovascular system: S1S2, RRR, no JVD.  Gastrointestinal system: Abdomen is soft, nt, ND BS+ Central nervous system: Alert , able to answer all questions appropriately.  Extremities: no cyanosis or clubbing.  Skin: No rashes seen.  Psychiatry:.  Mood is appropriate.    Data Reviewed: I have personally reviewed following labs and imaging studies  CBC: No results for input(s): WBC, NEUTROABS, HGB, HCT, MCV, PLT in the last 168 hours.  Basic Metabolic Panel: Recent Labs  Lab 03/20/20 0521  CREATININE 2.14*    GFR: Estimated Creatinine Clearance: 44.7 mL/min (A) (by C-G formula based on SCr of 2.14 mg/dL (H)).  Liver Function Tests: No results for input(s): AST, ALT, ALKPHOS, BILITOT, PROT, ALBUMIN in the last 168 hours.  CBG: Recent Labs  Lab 03/25/20 1638 03/25/20 2118 03/26/20 0638 03/26/20 1108 03/26/20 1649  GLUCAP 177* 222* 100* 134* 137*      Recent Results (from the past 240 hour(s))  SARS Coronavirus 2 by RT PCR (hospital order, performed in [facility] hospital lab) Nasopharyngeal Nasopharyngeal Swab     Status: None   Collection Time: 03/23/20 11:45 AM   Specimen: Nasopharyngeal Swab  Result Value Ref Range Status   SARS Coronavirus 2 NEGATIVE NEGATIVE Final    Comment: (NOTE) SARS-CoV-2 target nucleic acids are NOT DETECTED.  The SARS-CoV-2 RNA is generally detectable in upper and lower respiratory specimens during the acute phase of infection. The lowest concentration of SARS-CoV-2 viral copies this assay can detect is 250 copies / mL. A negative result does not preclude SARS-CoV-2 infection and should not be used as the sole basis for treatment or other patient management decisions.  A negative result may occur with improper specimen collection / handling, submission of specimen other than nasopharyngeal swab, presence of viral mutation(s) within the areas targeted by this assay, and inadequate number of viral copies (<250 copies / mL). A negative result must be combined with clinical observations, patient history, and epidemiological information.  Fact Sheet for Patients:   03/25/20  Fact Sheet for Healthcare Providers: BoilerBrush.com.cy  This test is not yet approved or  cleared by  the [employer] and has been authorized for detection and/or diagnosis of SARS-CoV-2 by FDA under an Emergency Use Authorization (EUA).  This EUA will remain in effect (meaning this test can be used) for the duration of the COVID-19 declaration under Section 564(b)(1) of the Act, 21 U.S.C. section 360bbb-3(b)(1), unless the authorization is terminated or revoked sooner.  Performed at [facility], [address], [place], [place] [address]          Radiology Studies: No results found.      Scheduled Meds: . amLODipine  10 mg Oral Daily  .  aspirin EC  81 mg Oral Daily  . atorvastatin  80 mg Oral Daily  . carvedilol  37.5 mg Oral BID WC  . clopidogrel  75 mg Oral Daily  . furosemide  40 mg Oral Daily  . heparin injection (subcutaneous)  5,000 Units Subcutaneous Q8H  . insulin aspart  0-15 Units Subcutaneous TID AC & HS  . QUEtiapine  12.5 mg Oral QHS  . senna  2 tablet Oral BID  . sodium chloride flush  3 mL Intravenous Q12H   Continuous Infusions: . sodium chloride       LOS: 14 days        Kathlen Mody, MD [facility]   To contact the attending provider between 7A-7P or the covering provider during after hours 7P-7A, please log into the web site [email or web address] and access using universal [place] password for that web site. If you do not have the password, please call the hospital operator.  03/26/2020, 5:38 PM

## 2020-03-26 NOTE — TOC Progression Note (Addendum)
Transition of Care [facility]) - Progression Note    Patient Details  Name: Trevor Anderson MRN: [id number] Date of Birth: June 16, 1955  Transition of Care [facility]) CM/SW Contact  Leone Haven, RN Phone Number: 03/26/2020, 4:53 PM  Clinical Narrative:    NCM spoke with sister Dawayne Cirri and son Geronimo, Diliberto will [facility] on Monday to see a PCP.  Patient has VA benefit but no insurance,.  VA can not approve anything until patient sees and establish a PCP at the [place].  When he goes to the apt on MOnday he will also need to go to the same day access clinic to establish [facility] services with home maker aide. This information was given to MD and son Covey. NCM ordered wheelchair for patient under charity.   17:00- Son states he will need transportation to take patient to the [place] on Monday because he does not think the aunt will be able to get him in and out of the car.  May have to call the [facility] to see if they can set up transport for him to the [place] for Monday.  NCM informed MD and Adonte of this information.  Leshawn states he will see what he can work on, on his end.    Expected Discharge Plan: IP Rehab Facility Barriers to Discharge: Continued Medical Work up, SNF Pending bed offer, Inadequate or no insurance  Expected Discharge Plan and Services Expected Discharge Plan: IP Rehab Facility In-house Referral: Clinical Social Work Discharge Planning Services: CM Consult Post Acute Care Choice: IP Rehab   Expected Discharge Date: 03/23/20                                     Social Determinants of Health (SDOH) Interventions    Readmission Risk Interventions No flowsheet data found.

## 2020-03-27 LAB — GLUCOSE, CAPILLARY
Glucose-Capillary: 106 mg/dL — ABNORMAL HIGH (ref 70–99)
Glucose-Capillary: 116 mg/dL — ABNORMAL HIGH (ref 70–99)
Glucose-Capillary: 182 mg/dL — ABNORMAL HIGH (ref 70–99)
Glucose-Capillary: 169 mg/dL — ABNORMAL HIGH (ref 70–99)

## 2020-03-27 MED ORDER — STROKE: EARLY STAGES OF RECOVERY BOOK
Freq: Once | Status: AC
  Administered 2020-03-27: 1
  Filled 2020-03-27: qty 1

## 2020-03-27 NOTE — TOC Progression Note (Signed)
Transition of Care [facility]) - Progression Note    Patient Details  Name: Trevor Anderson MRN: [id number] Date of Birth: 02/14/1956  Transition of Care [facility]) CM/SW [place], [place] Phone Number: 03/27/2020, 10:16 AM  Clinical Narrative:    CSW met with pt and son at bedside to answer questions about upcoming dc. Pt's son states that the plan was not explained clearly to him (plan was explained to pt's son and pt's sister). CSW reiterated that pt would need to go to the [place] in order to reactivate benefits. Pt would be a walk-in at would need to arrive fairly early (around 7 am) in order to ensure pt would be seen. Once pt is seen he will be eligible for services through the [place] (in this case [facility] services) and will be assigned a PCP. Pt's son states that pt's sister told RNCM that she would be able to transport pt but when pt's son asked pt's sister directly, pt's sister stated she would not be able to transport pt. Pt's son states pt will need transportation to the [place] in [place] at [employer]. CSW inquired whether or not pt could sit in a car and both pt and son denied pt being able to sit in a car comfortably/safely. CSW advised that CSW could look into medical transport services for pt but without insurance, pt will more than likely be responsible for payment upfront.  CSW reached out to [employer], they are able to pick pt up at 10:00 am and transport pt to the [place] [facility] and then home. A quote of $155 was given for the roundtrip. CSW provided this information to pt's son who agreed. Appointment was made for pt to be picked up from the [facility] entrance A. MD made aware of updated dc plans.    Expected Discharge Plan: IP Rehab Facility Barriers to Discharge: Continued Medical Work up, SNF Pending bed offer, Inadequate or no insurance  Expected Discharge Plan and Services Expected Discharge Plan: [place] In-house Referral: Clinical Social Work Discharge Planning  Services: CM Consult Post Acute Care Choice: IP Rehab   Expected Discharge Date: 03/23/20                                     Social Determinants of Health (SDOH) Interventions    Readmission Risk Interventions No flowsheet data found.

## 2020-03-27 NOTE — Progress Notes (Signed)
PROGRESS NOTE    Trevor Anderson  ZOX:[id number] DOB: 03-Jul-1955 DOA: 03/11/2020 PCP: [name], MD   Chief Complaint  Patient presents with  . Shortness of Breath    Brief Narrative:  64 years old male with PMH of DMII,CAD(cath 2009 nonobstructive disease), chronic diastolic CHF(EF 55%),hyperlipidemia, hypertension and medical noncompliance presents in the emergency department withcomplaints of acute shortness of breath.    VQ scan ordered is negative for PE.  And patient was admitted for acute on chronic diastolic heart failure and hypertensive urgency. On 03/12/2020 patient was found to have right-sided weakness and slurred speech.  Code stroke was called, was not deemed a candidate for TPA.  Neurology was consulted.  MRI of the brain showed mild cerebral infarcts with a large left basal ganglion infarct with resultant right hemiplegia.  Therapy evaluations recommended CIR. [facility] in [facility]. Plan to discharge the patient on MOnday to [facility] directly and be seen by the physician and the rehab to be ordered and then home.  Pt seen and examined at bedside, no new complaints at this time.     Assessment & Plan:   Principal Problem:   Acute on chronic systolic CHF (congestive heart failure) (HCC) Active Problems:   Type 2 diabetes mellitus without complication, with long-term current use of insulin (HCC)   Mixed hyperlipidemia   Coronary artery disease involving native coronary artery of native heart without angina pectoris   Hypertensive urgency   AKI (acute kidney injury) (HCC)   Fall at home, initial encounter   Right hip pain   Hypokalemia   Cerebral thrombosis with cerebral infarction   Cerebrovascular accident (CVA) (HCC)  Right-sided weakness secondary to by cerebral infarct with a large left basal ganglion infarct with resultant right hemiplegia on 03/13/19 21. Neurology consulted and evaluated the patient.   Reviewed the CT angiogram of the head and neck and MRI of the brain. Neurology recommended dual antiplatelet therapy for now.  Continue with aspirin 81 mg daily and Plavix 75 mg daily for 3 weeks followed by Plavix alone. On Lipitor 80 mg daily. Speech therapy recommended dysphagia 3 diet. TEE was done does not show any embolic source.  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 Bilateral lower extremity duplex negative for DVT Therapy evaluations recommending CIR with 24-hour supervision Unfortunately [facility] could not [facility] as [facility] not [facility] in more than 2 years.  Plan to discharge the patient on Monday directly from the hospital to the [facility] clinic get physical therapy occupational therapy/rehab after left MD discharge home from the clinic.  TOC on board and providing transport to the [place] clinic on Monday.    Cryptogenic stroke Cardiology consulted for possible implantable loop recorder placement,  done on 03/23/2020.  Recommend outpatient follow-up with Dr. [name].    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 Echocardiogram reviewed. VQ scan is negative for PE. Continue with strict intake and output and daily weights Continue with aspirin, Plavix, Coreg, Lasix and Lipitor.  No changes in medications at this time     Hypokalemia Replacement no     Mild AKI Baseline creatinine around 1.1 in 2019 currently stabilized  at 2.      Hypertensive urgency Resolved.  blood pressure parameters are optimal    Type 2 diabetes mellitus controlled  CBG (last 3)  Recent Labs    03/26/20 2136 03/27/20 0602 03/27/20 1108  GLUCAP 123* 106* 116*   Continue sliding  scale insulin.  No changes in medications at this time    Hyperlipidemia Continue with the Lipitor.    Obstructive sleep apnea Recommend outpatient follow-up with a sleep study.    History of coronary artery disease No complaints of chest pain Elevated troponins probably secondary to stroke. Continue with aspirin and Plavix at this time.   Restlessness Continue with Seroquel 12.5 mg daily    DVT prophylaxis: (Heparin Code Status: Full code Family Communication: None at bedside discussed with sister over 68 Disposition:   Status is: Inpatient  Remains inpatient appropriate because:Unsafe d/c plan   Dispo:  Patient From: Home  Planned Disposition: Home  expected discharge date: 03/29/20  Medically stable for discharge: Yes        Consultants: Neurology     Procedures: none.   Antimicrobials: none   Subjective: No new complaints  Objective: Vitals:   03/26/20 2027 03/27/20 0503 03/27/20 0624 03/27/20 1111  BP: 117/69 132/74  129/79  Pulse: 77 81  80  Resp: 17 17  20   Temp: 98.4 F (36.9 C) 98.9 F (37.2 C)  98.4 F (36.9 C)  TempSrc: Oral Oral  Oral  SpO2: 99% 100%  98%  Weight:   110 kg   Height:        Intake/Output Summary (Last 24 hours) at 03/27/2020 1347 Last data filed at 03/27/2020 1300 Gross per 24 hour  Intake 783 ml  Output 950 ml  Net -167 ml   Filed Weights   03/25/20 0528 03/26/20 0347 03/27/20 0624  Weight: 109.4 kg 107.8 kg 110 kg    Examination:  General exam: Elderly gentleman appears comfortable not in any kind of distress Respiratory system: Clear to auscultation bilaterally, no wheezing or rhonchi Cardiovascular system: S1-S2 heard, regular rate rhythm, no JVD Gastrointestinal system: Abdomen is soft, nontender, nondistended, bowel sounds normal Central nervous system: Alert and able to answer all questions appropriately, right hemiparesis Extremities: No pedal edema or cyanosis Skin: No rashes  seen Psychiatry:.  Mood is appropriate   Data Reviewed: I have personally reviewed following labs and imaging studies  CBC: No results for input(s): WBC, NEUTROABS, HGB, HCT, MCV, PLT in the last 168 hours.  Basic Metabolic Panel: No results for input(s): NA, K, CL, CO2, GLUCOSE, BUN, CREATININE, CALCIUM, MG, PHOS in the last 168 hours.  GFR: Estimated Creatinine Clearance: 45.2 mL/min (A) (by C-G formula based on SCr of 2.14 mg/dL (H)).  Liver Function Tests: No results for input(s): AST, ALT, ALKPHOS, BILITOT, PROT, ALBUMIN in the last 168 hours.  CBG: Recent Labs  Lab 03/26/20 1108 03/26/20 1649 03/26/20 2136 03/27/20 0602 03/27/20 1108  GLUCAP 134* 137* 123* 106* 116*     Recent Results (from the past 240 hour(s))  SARS Coronavirus 2 by RT PCR (hospital order, performed in [facility] hospital lab) Nasopharyngeal Nasopharyngeal Swab     Status: None   Collection Time: 03/23/20 11:45 AM   Specimen: Nasopharyngeal Swab  Result Value Ref Range Status   SARS Coronavirus 2 NEGATIVE NEGATIVE Final    Comment: (NOTE) SARS-CoV-2 target nucleic acids are NOT DETECTED.  The SARS-CoV-2 RNA is generally detectable in upper and lower respiratory specimens during the acute phase of  infection. The lowest concentration of SARS-CoV-2 viral copies this assay can detect is 250 copies / mL. A negative result does not preclude SARS-CoV-2 infection and should not be used as the sole basis for treatment or other patient management decisions.  A negative result may occur with improper specimen collection / handling, submission of specimen other than nasopharyngeal swab, presence of viral mutation(s) within the areas targeted by this assay, and inadequate number of viral copies (<250 copies / mL). A negative result must be combined with clinical observations, patient history, and epidemiological information.  Fact Sheet for Patients:   BoilerBrush.com.cy  Fact  Sheet for Healthcare Providers: https://pope.com/  This test is not yet approved or  cleared by the [place] FDA and has been authorized for detection and/or diagnosis of SARS-CoV-2 by FDA under an Emergency Use Authorization (EUA).  This EUA will remain in effect (meaning this test can be used) for the duration of the COVID-19 declaration under Section 564(b)(1) of the Act, 21 U.S.C. section 360bbb-3(b)(1), unless the authorization is terminated or revoked sooner.  Performed at [facility], [address] [place], [place] [address]          Radiology Studies: No results found.      Scheduled Meds: . amLODipine  10 mg Oral Daily  . aspirin EC  81 mg Oral Daily  . atorvastatin  80 mg Oral Daily  . carvedilol  37.5 mg Oral BID WC  . clopidogrel  75 mg Oral Daily  . furosemide  40 mg Oral Daily  . heparin injection (subcutaneous)  5,000 Units Subcutaneous Q8H  . insulin aspart  0-15 Units Subcutaneous TID AC & HS  . QUEtiapine  12.5 mg Oral QHS  . senna  2 tablet Oral BID  . sodium chloride flush  3 mL Intravenous Q12H   Continuous Infusions: . sodium chloride       LOS: 15 days        Kathlen Mody, MD [facility]   To contact the attending provider between 7A-7P or the covering provider during after hours 7P-7A, please log into the web site [email or web address] and access using universal [place] password for that web site. If you do not have the password, please call the hospital operator.  03/27/2020, 1:47 PM

## 2020-03-27 NOTE — Progress Notes (Signed)
Please call son at discharge and provide an extra copy of AVS d/t multiple caregivers if possible.

## 2020-03-28 DIAGNOSIS — I6330 Cerebral infarction due to thrombosis of unspecified cerebral artery: Secondary | ICD-10-CM

## 2020-03-28 LAB — BASIC METABOLIC PANEL
Anion gap: 8 (ref 5–15)
BUN: 22 mg/dL (ref 8–23)
CO2: 27 mmol/L (ref 22–32)
Calcium: 8.8 mg/dL — ABNORMAL LOW (ref 8.9–10.3)
Chloride: 102 mmol/L (ref 98–111)
Creatinine, Ser: 1.56 mg/dL — ABNORMAL HIGH (ref 0.61–1.24)
GFR, Estimated: 50 mL/min — ABNORMAL LOW (ref >60)
Glucose, Bld: 118 mg/dL — ABNORMAL HIGH (ref 70–99)
Potassium: 3.8 mmol/L (ref 3.5–5.1)
Sodium: 137 mmol/L (ref 135–145)

## 2020-03-28 LAB — GLUCOSE, CAPILLARY
Glucose-Capillary: 100 mg/dL — ABNORMAL HIGH (ref 70–99)
Glucose-Capillary: 188 mg/dL — ABNORMAL HIGH (ref 70–99)
Glucose-Capillary: 125 mg/dL — ABNORMAL HIGH (ref 70–99)
Glucose-Capillary: 143 mg/dL — ABNORMAL HIGH (ref 70–99)

## 2020-03-28 NOTE — Plan of Care (Signed)
  Problem: Education: Goal: Ability to demonstrate management of disease process will improve Outcome: Progressing Goal: Ability to verbalize understanding of medication therapies will improve Outcome: Progressing Goal: Individualized Educational Video(s) Outcome: Progressing   Problem: Activity: Goal: Capacity to carry out activities will improve Outcome: Progressing   Problem: Cardiac: Goal: Ability to achieve and maintain adequate cardiopulmonary perfusion will improve Outcome: Progressing   Problem: Education: Goal: Knowledge of General Education information will improve Description: Including pain rating scale, medication(s)/side effects and non-pharmacologic comfort measures Outcome: Progressing   Problem: Health Behavior/Discharge Planning: Goal: Ability to manage health-related needs will improve Outcome: Progressing   Problem: Clinical Measurements: Goal: Ability to maintain clinical measurements within normal limits will improve Outcome: Progressing Goal: Will remain free from infection Outcome: Progressing Goal: Diagnostic test results will improve Outcome: Progressing Goal: Respiratory complications will improve Outcome: Progressing Goal: Cardiovascular complication will be avoided Outcome: Progressing   Problem: Activity: Goal: Risk for activity intolerance will decrease Outcome: Progressing   Problem: Nutrition: Goal: Adequate nutrition will be maintained Outcome: Progressing   Problem: Coping: Goal: Level of anxiety will decrease Outcome: Progressing   Problem: Elimination: Goal: Will not experience complications related to bowel motility Outcome: Progressing Goal: Will not experience complications related to urinary retention Outcome: Progressing   Problem: Pain Managment: Goal: General experience of comfort will improve Outcome: Progressing   Problem: Safety: Goal: Ability to remain free from injury will improve Outcome: Progressing    Problem: Skin Integrity: Goal: Risk for impaired skin integrity will decrease Outcome: Progressing   Problem: Education: Goal: Knowledge of disease or condition will improve Outcome: Progressing Goal: Knowledge of secondary prevention will improve Outcome: Progressing Goal: Knowledge of patient specific risk factors addressed and post discharge goals established will improve Outcome: Progressing Goal: Individualized Educational Video(s) Outcome: Progressing   Problem: Coping: Goal: Will verbalize positive feelings about self Outcome: Progressing Goal: Will identify appropriate support needs Outcome: Progressing   Problem: Coping: Goal: Will identify appropriate support needs Outcome: Progressing   Problem: Self-Care: Goal: Ability to participate in self-care as condition permits will improve Outcome: Progressing Goal: Verbalization of feelings and concerns over difficulty with self-care will improve Outcome: Progressing Goal: Ability to communicate needs accurately will improve Outcome: Progressing   Problem: Nutrition: Goal: Risk of aspiration will decrease Outcome: Progressing Goal: Dietary intake will improve Outcome: Progressing   Problem: Ischemic Stroke/TIA Tissue Perfusion: Goal: Complications of ischemic stroke/TIA will be minimized Outcome: Progressing   Problem: Education: Goal: Knowledge of disease or condition will improve Outcome: Progressing Goal: Knowledge of secondary prevention will improve Outcome: Progressing Goal: Knowledge of patient specific risk factors addressed and post discharge goals established will improve Outcome: Progressing   Problem: Coping: Goal: Will verbalize positive feelings about self Outcome: Progressing Goal: Will identify appropriate support needs Outcome: Progressing

## 2020-03-28 NOTE — Discharge Summary (Signed)
Physician Discharge Summary  Trevor Anderson ZOX:[id number] DOB: June 08, 1955 DOA: 03/11/2020  PCP: [name], MD  Admit date: 03/11/2020 Discharge date: 03/29/2020  Admitted From: Home.  Disposition:  Home.   Recommendations for Outpatient Follow-up:   Please follow up With [facility] Physician today.  Discharge Condition:STABLE.  CODE STATUS: FULL CODE.  Diet recommendation: Heart Healthy   Brief/Interim Summary: 64 years old male with PMH of DMII,CAD(cath 2009 nonobstructive disease), chronic diastolic CHF(EF 55%),hyperlipidemia, hypertension and medical noncompliance presents in the emergency department withcomplaints of acute shortness of breath.   VQ scan ordered is negative for PE.  And patient was admitted for acute on chronic diastolic heart failure and hypertensive urgency. On 03/12/2020 patient was found to have right-sided weakness and slurred speech.  Code stroke was called, was not deemed a candidate for TPA.  Neurology was consulted.  MRI of the brain showed mild cerebral infarcts with a large left basal ganglion infarct with resultant right hemiplegia.  Therapy evaluations recommended CIR. [facility] in [facility]. Plan to discharge the patient on MOnday to [facility] directly and be seen by the physician and the rehab to be ordered and then home.  Pt seen and examined at bedside, no new complaints at this time.    Discharge Diagnoses:  Principal Problem:   Acute on chronic systolic CHF (congestive heart failure) (HCC) Active Problems:   Type 2 diabetes mellitus without complication, with long-term current use of insulin (HCC)   Mixed hyperlipidemia   Coronary artery disease involving native coronary artery of native heart without angina pectoris   Hypertensive urgency   AKI (acute kidney injury) (HCC)   Fall at home, initial encounter   Right hip pain   Hypokalemia   Cerebral thrombosis with cerebral infarction    Cerebrovascular accident (CVA) (HCC)   Right-sided weakness secondary to by cerebral infarct with a large left basal ganglion infarct with resultant right hemiplegia on 03/13/19 21. Neurology consulted and evaluated the patient.  Reviewed the CT angiogram of the head and neck and MRI of the brain. Neurology recommended dual antiplatelet therapy for now.  Continue with aspirin 81 mg daily and Plavix 75 mg daily for 3 weeks followed by Plavix alone. On Lipitor 80 mg daily. Speech therapy recommended dysphagia 3 diet. TEE was done does not show any embolic source.  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 Bilateral lower extremity duplex negative for DVT Therapy evaluations recommending CIR with 24-hour supervision Unfortunately [facility] could not [facility] as patient did not see a [facility] physician in more than 2 years.  Plan to discharge the patient on Monday directly from the hospital to the [facility] clinic get physical therapy occupational therapy/rehab after left MD discharge home from the clinic.  TOC on board and providing transport to the [place] clinic on Monday.    Cryptogenic stroke Cardiology consulted for possible implantable loop recorder placement,  done on 03/23/2020.  Recommend outpatient follow-up with Dr. [name].    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 Echocardiogram reviewed. VQ scan is negative for PE. Continue with strict intake and output and daily weights Continue with aspirin, Plavix, Coreg, Lasix and Lipitor.  No changes in medications  at this time     Hypokalemia Replacement no     Mild AKI Baseline creatinine around 1.1 in 2019 currently stabilized at 2.       Hypertensive urgency Resolved.  blood pressure parameters are optimal    Type 2 diabetes mellitus controlled   No changes in medications at this time    Hyperlipidemia Continue with the Lipitor.    Obstructive sleep apnea Recommend outpatient follow-up with a sleep study.    History of coronary artery disease No complaints of chest pain Elevated troponins probably secondary to stroke. Continue with aspirin and Plavix at this time.   Restlessness Continue with Seroquel 12.5 mg daily      Discharge Instructions   Allergies as of 03/28/2020   No Known Allergies     Medication List    STOP taking these medications   hydrALAZINE 25 MG tablet Commonly known as: APRESOLINE   isosorbide mononitrate 60 MG 24 hr tablet Commonly known as: IMDUR   lisinopril 20 MG tablet Commonly known as: ZESTRIL   metFORMIN 500 MG tablet Commonly known as: GLUCOPHAGE   simvastatin 40 MG tablet Commonly known as: ZOCOR     TAKE these medications   amLODipine 10 MG tablet Commonly known as: NORVASC Take 1 tablet (10 mg total) by mouth daily. What changed:   medication strength  how much to take   aspirin 81 MG tablet Take 1 tablet (81 mg total) by mouth daily for 21 days.   atorvastatin 80 MG tablet Commonly known as: LIPITOR Take 1 tablet (80 mg total) by mouth daily.   carvedilol 12.5 MG tablet Commonly known as: COREG Take 3 tablets (37.5 mg total) by mouth 2 (two) times daily with a meal. What changed:   medication strength  how much to take   clopidogrel 75 MG tablet Commonly known as: PLAVIX Take 1 tablet (75 mg total) by mouth daily.   furosemide 40 MG tablet Commonly known as: LASIX Take 1 tablet (40 mg total) by mouth daily.   Lancets Misc 1 application by Does not apply route daily.   potassium chloride SA 20 MEQ tablet Commonly known as: Klor-Con M20 Take 1 tablet (20 mEq total) by mouth daily.   QUEtiapine  25 MG tablet Commonly known as: SEROQUEL Take 0.5 tablets (12.5 mg total) by mouth at bedtime.   senna 8.6 MG Tabs tablet Commonly known as: SENOKOT Take 2 tablets (17.2 mg total) by mouth at bedtime for 14 days.            Durable Medical Equipment  (From admission, onward)         Start     Ordered   03/26/20 1648  For home use only DME lightweight manual wheelchair with seat cushion  Once       Comments: Patient suffers from CVA which impairs their ability to perform daily activities like dressing and bathing in the home.  A walker or cane will not resolve  issue with performing activities of daily living. A wheelchair will allow patient to safely perform daily activities. Patient is not able to propel themselves in the home using a standard weight wheelchair due to wekaness. Patient can self propel in the lightweight wheelchair. Length of need lifetime. Accessories: elevating leg rests (ELRs), wheel locks, extensions and anti-tippers.   03/26/20 1648   03/19/20 1429  For home use only DME 3 n 1  Once        03/19/20 1429   03/19/20 1429  For home use only DME Hospital bed  Once       Question Answer Comment  Length of Need 6 Months   The above medical condition requires: Patient requires the ability to reposition frequently   Head must be elevated greater than: 30 degrees   Bed type Semi-electric   Hoyer Lift Yes      03/19/20 1429   03/19/20 1427  For home use only DME lightweight manual wheelchair with seat cushion  Once       Comments: Patient suffers from ambulatory dysfunction which impairs their ability to perform daily activities like activity of daily living in the home.  A walker will not resolve issue with performing activities of daily living. A wheelchair will allow patient to safely perform daily activities. Patient is not able to propel themselves in the home using a standard weight wheelchair due to right-sided weakness patient can self propel in the lightweight  wheelchair. Length of need 6 months. Accessories: elevating leg rests, wheel locks, extensions and anti-tippers.   03/19/20 1428          Follow-up Information    [name], MD. Call in 1 day(s).   Specialties: Internal Medicine, Radiology Why: Please call for a post hospital follow up appointment Contact information: [address] [place] [address] [phone]        Micki Riley, MD. Call in 1 day(s).   Specialties: Neurology, Radiology Why: please call for a post hospital follow up appointment Contact information: [address] [place] [address] [phone]        Duke Salvia, MD. Call in 1 day(s).   Specialty: Cardiology Why: Please call for a post hospital follow up appointment Contact information: [address] [place] [address] [phone]        Jodelle Red, MD. Call in 1 day(s).   Specialty: Cardiology Why: Please call for a post hospital follow up appointment. Contact information: [address] [place] [address] [place] [address] [phone]        Sheilah Pigeon, PA-C Follow up.   Specialty: Cardiology Why: 04/02/2020 @ 10:15AM, wound check visit, (heart monitor implant) Contact information: [address] [place] [address] [phone]        Margar[facility] [employer] Follow up.   Why: wheelchair Contact information: [address] [place] [address] [phone]              No Known Allergies  Consultations:  Cardiology  Neurology.    Procedures/Studies: CT Code Stroke CTA Head W/WO contrast  Result Date: 03/12/2020 CLINICAL DATA:  64 year old male code stroke presentation. Right side symptoms. EXAM: CT ANGIOGRAPHY HEAD AND NECK CT PERFUSION BRAIN TECHNIQUE: Multidetector CT imaging of the head and neck was performed using the standard protocol during bolus administration of intravenous contrast. Multiplanar CT image  reconstructions and MIPs were obtained to evaluate the vascular anatomy. Carotid stenosis measurements (when applicable) are obtained utilizing NASCET criteria, using the distal internal carotid diameter as the denominator. Multiphase CT imaging of the brain was performed following IV bolus contrast injection. Subsequent parametric perfusion maps were calculated using RAPID software. CONTRAST:  OMNIPAQUE IOHEXOL 350 MG/ML SOLN COMPARISON:  Plain head CT 0845 hours. FINDINGS: CT Brain Perfusion Findings: ASPECTS: 10 CBF (<30%) Volume: None Perfusion (Tmax>6.0s) volume: None Mismatch Volume: Not applicable Infarction Location:Not applicable CTA NECK Skeleton: No acute osseous abnormality identified. Upper chest: Upper lung atelectasis. Negative visible superior  mediastinum. Other neck: The glottis is closed. No acute neck soft tissue finding is identified. Aortic arch: 3 vessel arch configuration. Minimal arch atherosclerosis. Right carotid system: Tortuous brachiocephalic artery and proximal right CCA without stenosis. Minimal calcified plaque at the right ICA origin and bulb without stenosis. Left carotid system: Mildly tortuous proximal left CCA. Mild calcified plaque at the left ICA origin and bulb without stenosis. Vertebral arteries: Calcified plaque and tortuosity at the right subclavian artery origin without significant stenosis. Normal right vertebral artery origin. Tortuous right V1 segment. Patent right vertebral to the skull base without stenosis. Mildly tortuous proximal left subclavian artery without significant plaque. Normal left vertebral artery origin. Mildly tortuous left V1 segment. Codominant left vertebral is patent to the skull base without plaque or stenosis. CTA HEAD Posterior circulation: Patent right vertebral V4 segment to the vertebrobasilar junction without stenosis. Patent right PICA origin. Normal left PICA origin. Beyond the left PICA in the left V4 segment there is evidence of  soft plaque on series 7, image 159 but only mild associated stenosis. Patent left vertebrobasilar junction. Patent basilar artery with mild irregularity but no stenosis. Patent SCA and left PCA origins. Fetal right PCA origin. Left posterior communicating artery diminutive or absent. Mild to moderate left PCA P2 segment irregularity and stenosis on series 12, image 23. Mild right PCA irregularity without stenosis. Anterior circulation: Both ICA siphons are patent without plaque or stenosis. Normal ophthalmic and right posterior communicating artery origins. Patent carotid termini. Patent MCA and ACA origins. Diminutive or absent anterior communicating artery. Mild bilateral ACA irregularity. No proximal ACA stenosis. Up to moderate distal ACA irregularity and stenosis on series 12, image 21. Right MCA M1 segment is patent to the bifurcation with mild irregularity and stenosis. At the right MCA bifurcation there is moderate stenosis. Right MCA M2 branches are patent. There is moderate right posterior MCA M2 and M3 branch irregularity and stenosis (series 12, image 12. Moderate right anterior M3 irregularity and stenosis. No right MCA branch occlusion identified. Mild to moderate irregularity with mild stenosis of the distal left MCA M1 (series 7, image 116). Left MCA bifurcation is patent. There is no M2 branch occlusion. Only mild proximal M2 irregularity. There is more moderate irregularity of the distal posterior M2 with mild to moderate stenosis (series 12, image 29). No right MCA branch occlusion is identified. Venous sinuses: Early contrast timing, not well evaluated. Anatomic variants: Fetal right PCA origin. Review of the MIP images confirms the above findings IMPRESSION: 1. Negative for large vessel occlusion, and no core infarct or ischemic penumbra detected by CT Perfusion. 2. Mild for age extracranial but fairly advanced intracranial atherosclerosis. Up to moderate circle-of-Willis branch irregularity and  stenoses in the bilateral MCAs (M2 and M3), left PCA (P2), and distal ACAs 3. Preliminary results of the above discussed by telephone with Dr. Otelia Limes at [phone] hours. Final results were communicated to Dr. Otelia Limes at [phone] hours 03/12/2020 by text page via the [facility] messaging system. Electronically Signed   By: Odessa Fleming M.D.   On: 03/12/2020 09:55   DG Chest 2 View  Result Date: 03/11/2020 CLINICAL DATA:  Cough, shortness of breath EXAM: CHEST - 2 VIEW COMPARISON:  08/21/2007 FINDINGS: Cardiomegaly. No confluent opacities, effusions or edema. No acute bony abnormality. IMPRESSION: Cardiomegaly.  No acute cardiopulmonary disease. Electronically Signed   By: Charlett Nose M.D.   On: 03/11/2020 18:53   CT Code Stroke CTA Neck W/WO contrast  Result Date: 03/12/2020 CLINICAL DATA:  64 year old male code stroke presentation. Right side symptoms. EXAM: CT ANGIOGRAPHY HEAD AND NECK CT PERFUSION BRAIN TECHNIQUE: Multidetector CT imaging of the head and neck was performed using the standard protocol during bolus administration of intravenous contrast. Multiplanar CT image reconstructions and MIPs were obtained to evaluate the vascular anatomy. Carotid stenosis measurements (when applicable) are obtained utilizing NASCET criteria, using the distal internal carotid diameter as the denominator. Multiphase CT imaging of the brain was performed following IV bolus contrast injection. Subsequent parametric perfusion maps were calculated using RAPID software. CONTRAST:  OMNIPAQUE IOHEXOL 350 MG/ML SOLN COMPARISON:  Plain head CT 0845 hours. FINDINGS: CT Brain Perfusion Findings: ASPECTS: 10 CBF (<30%) Volume: None Perfusion (Tmax>6.0s) volume: None Mismatch Volume: Not applicable Infarction Location:Not applicable CTA NECK Skeleton: No acute osseous abnormality identified. Upper chest: Upper lung atelectasis. Negative visible superior mediastinum. Other neck: The glottis is closed. No acute neck soft tissue finding is  identified. Aortic arch: 3 vessel arch configuration. Minimal arch atherosclerosis. Right carotid system: Tortuous brachiocephalic artery and proximal right CCA without stenosis. Minimal calcified plaque at the right ICA origin and bulb without stenosis. Left carotid system: Mildly tortuous proximal left CCA. Mild calcified plaque at the left ICA origin and bulb without stenosis. Vertebral arteries: Calcified plaque and tortuosity at the right subclavian artery origin without significant stenosis. Normal right vertebral artery origin. Tortuous right V1 segment. Patent right vertebral to the skull base without stenosis. Mildly tortuous proximal left subclavian artery without significant plaque. Normal left vertebral artery origin. Mildly tortuous left V1 segment. Codominant left vertebral is patent to the skull base without plaque or stenosis. CTA HEAD Posterior circulation: Patent right vertebral V4 segment to the vertebrobasilar junction without stenosis. Patent right PICA origin. Normal left PICA origin. Beyond the left PICA in the left V4 segment there is evidence of soft plaque on series 7, image 159 but only mild associated stenosis. Patent left vertebrobasilar junction. Patent basilar artery with mild irregularity but no stenosis. Patent SCA and left PCA origins. Fetal right PCA origin. Left posterior communicating artery diminutive or absent. Mild to moderate left PCA P2 segment irregularity and stenosis on series 12, image 23. Mild right PCA irregularity without stenosis. Anterior circulation: Both ICA siphons are patent without plaque or stenosis. Normal ophthalmic and right posterior communicating artery origins. Patent carotid termini. Patent MCA and ACA origins. Diminutive or absent anterior communicating artery. Mild bilateral ACA irregularity. No proximal ACA stenosis. Up to moderate distal ACA irregularity and stenosis on series 12, image 21. Right MCA M1 segment is patent to the bifurcation with mild  irregularity and stenosis. At the right MCA bifurcation there is moderate stenosis. Right MCA M2 branches are patent. There is moderate right posterior MCA M2 and M3 branch irregularity and stenosis (series 12, image 12. Moderate right anterior M3 irregularity and stenosis. No right MCA branch occlusion identified. Mild to moderate irregularity with mild stenosis of the distal left MCA M1 (series 7, image 116). Left MCA bifurcation is patent. There is no M2 branch occlusion. Only mild proximal M2 irregularity. There is more moderate irregularity of the distal posterior M2 with mild to moderate stenosis (series 12, image 29). No right MCA branch occlusion is identified. Venous sinuses: Early contrast timing, not well evaluated. Anatomic variants: Fetal right PCA origin. Review of the MIP images confirms the above findings IMPRESSION: 1. Negative for large vessel occlusion, and no core infarct or ischemic penumbra detected by CT Perfusion. 2. Mild for age extracranial but fairly advanced  intracranial atherosclerosis. Up to moderate circle-of-Willis branch irregularity and stenoses in the bilateral MCAs (M2 and M3), left PCA (P2), and distal ACAs 3. Preliminary results of the above discussed by telephone with Dr. Otelia Limes at [phone] hours. Final results were communicated to Dr. Otelia Limes at [phone] hours 03/12/2020 by text page via the [facility] messaging system. Electronically Signed   By: Odessa Fleming M.D.   On: 03/12/2020 09:55   MR BRAIN WO CONTRAST  Result Date: 03/14/2020 CLINICAL DATA:  Code stroke follow-up, abnormal CT EXAM: MRI HEAD WITHOUT CONTRAST TECHNIQUE: Multiplanar, multiecho pulse sequences of the brain and surrounding structures were obtained without intravenous contrast. COMPARISON:  Correlation made with prior CT imaging FINDINGS: Motion artifact is present. Brain: There is restricted diffusion involving the left caudate body, corona radiata, and posterior left lentiform nucleus. There are 3 small foci of  restricted diffusion involving the right corona radiata and lentiform nucleus. Small focus of restricted diffusion in the left parasagittal cerebellum. There are two punctate foci of susceptibility in the right corona radiata and a third punctate focus in the medial left temporal lobe likely reflecting chronic microhemorrhages. Additional patchy and confluent areas of T2 hyperintensity in the supratentorial white matter are nonspecific but probably reflect moderate chronic microvascular ischemic changes. There is no intracranial mass or significant mass effect. No hydrocephalus. Vascular: Major vessel flow voids at the skull base are preserved. Skull and upper cervical spine: Normal marrow signal is preserved. Sinuses/Orbits: Paranasal sinuses are aerated. Orbits are unremarkable. Other: Sella is partially empty.  Mastoid air cells are clear. IMPRESSION: Moderate size acute infarction involving the left basal ganglia and adjacent white matter. Additional small acute infarcts involving the right corona radiata and basal ganglia and left paramedian cerebellum. Moderate chronic microvascular ischemic changes. Electronically Signed   By: Guadlupe Spanish M.D.   On: 03/14/2020 14:48   NM Pulmonary Perfusion  Result Date: 03/12/2020 CLINICAL DATA:  PE suspected.  High probability. EXAM: NUCLEAR MEDICINE PERFUSION LUNG SCAN TECHNIQUE: Perfusion images were obtained in multiple projections after intravenous injection of radiopharmaceutical. Ventilation scans intentionally deferred if perfusion scan and chest x-ray adequate for interpretation during COVID 19 epidemic. RADIOPHARMACEUTICALS:  4.3 mCi Tc-74m MAA IV COMPARISON:  None. FINDINGS: No significant filling defects are present. Normal perfusion is present bilaterally. IMPRESSION: Normal V/Q scan.  No pulmonary embolus. Electronically Signed   By: Marin Roberts M.D.   On: 03/12/2020 13:44   US RENAL  Result Date: 03/12/2020 CLINICAL DATA:  Renal failure  EXAM: RENAL / URINARY TRACT ULTRASOUND COMPLETE COMPARISON:  08/22/2007 FINDINGS: Right Kidney: Renal measurements: 11 x 5 x 5 cm = volume: 140 mL. Echogenicity is borderline increased based on corticomedullary differentiation. No mass or hydronephrosis visualized. Left Kidney: Renal measurements: 11 x 7 x 5 cm = volume: There is 200 mL. Echogenicity is borderline increased. No mass or hydronephrosis visualized. Bladder: Appears normal for degree of bladder distention. Other: Prominent liver echogenicity which could reflect steatosis. IMPRESSION: No hydronephrosis or renal atrophy. Electronically Signed   By: Marnee Spring M.D.   On: 03/12/2020 04:09   CT HIP RIGHT WO CONTRAST  Result Date: 03/12/2020 CLINICAL DATA:  Concern for hip fracture.  Inconclusive x-ray EXAM: CT OF THE RIGHT HIP WITHOUT CONTRAST TECHNIQUE: Multidetector CT imaging of the right hip was performed according to the standard protocol. Multiplanar CT image reconstructions were also generated. COMPARISON:  Radiography from yesterday FINDINGS: Bones/Joint/Cartilage No acute fracture or dislocation. Hip osteoarthritis with superolateral joint narrowing causing bone-on-bone contact and  subchondral cystic formation. Degenerative marginal spurring at the hip joint which is fairly bulky. Degenerative right sacroiliac spurring. Ligaments Suboptimally assessed by CT. Muscles and Tendons No visible injury. Soft tissues No hematoma or opaque foreign body IMPRESSION: 1. No acute finding. 2. Advanced right hip osteoarthritis. Electronically Signed   By: Marnee Spring M.D.   On: 03/12/2020 04:07   EP PPM/ICD IMPLANT  Result Date: 03/23/2020 SURGEON:  Hillis Range, MD   PREPROCEDURE DIAGNOSIS:  Cryptogenic Stroke   POSTPROCEDURE DIAGNOSIS:  Cryptogenic Stroke    PROCEDURES:  1. Implantable loop recorder implantation   INTRODUCTION:  JAEVON PARAS is a 64 y.o. male with a history of unexplained stroke who presents today for implantable loop  implantation.  The patient has had a cryptogenic stroke.  Despite an extensive workup by neurology, no reversible causes have been identified.  he has worn telemetry during which he did not have arrhythmias.  There is significant concern for possible atrial fibrillation as the cause for the patients stroke.  The patient therefore presents today for implantable loop implantation.   DESCRIPTION OF PROCEDURE:  Informed written consent was obtained.  The patient required no sedation for the procedure today.  The patients left chest was prepped and draped. Mapping over the patient's chest was performed to identify the appropriate ILR site.  This area was found to be the left  parasternal region over the 3rd-4th intercostal space.  The skin overlying this region was infiltrated with lidocaine for local analgesia.  A 0.5-cm incision was made at the implant site.  A subcutaneous ILR pocket was fashioned using a combination of sharp and blunt dissection.  A Medtronic Reveal Linq model [id number] implantable loop recorder was then placed into the pocket R waves were very prominent and measured > 0.2 mV. EBL<1 ml.  Steri- Strips and a sterile dressing were then applied.  There were no early apparent complications.   CONCLUSIONS:  1. Successful implantation of a Medtronic Reveal LINQ implantable loop recorder for cryptogenic stroke  2. No early apparent complications. Hillis Range MD, [facility] 03/23/2020 2:58 PM   CT Code Stroke Cerebral Perfusion with contrast  Result Date: 03/12/2020 CLINICAL DATA:  64 year old male code stroke presentation. Right side symptoms. EXAM: CT ANGIOGRAPHY HEAD AND NECK CT PERFUSION BRAIN TECHNIQUE: Multidetector CT imaging of the head and neck was performed using the standard protocol during bolus administration of intravenous contrast. Multiplanar CT image reconstructions and MIPs were obtained to evaluate the vascular anatomy. Carotid stenosis measurements (when applicable) are obtained utilizing  NASCET criteria, using the distal internal carotid diameter as the denominator. Multiphase CT imaging of the brain was performed following IV bolus contrast injection. Subsequent parametric perfusion maps were calculated using RAPID software. CONTRAST:  OMNIPAQUE IOHEXOL 350 MG/ML SOLN COMPARISON:  Plain head CT 0845 hours. FINDINGS: CT Brain Perfusion Findings: ASPECTS: 10 CBF (<30%) Volume: None Perfusion (Tmax>6.0s) volume: None Mismatch Volume: Not applicable Infarction Location:Not applicable CTA NECK Skeleton: No acute osseous abnormality identified. Upper chest: Upper lung atelectasis. Negative visible superior mediastinum. Other neck: The glottis is closed. No acute neck soft tissue finding is identified. Aortic arch: 3 vessel arch configuration. Minimal arch atherosclerosis. Right carotid system: Tortuous brachiocephalic artery and proximal right CCA without stenosis. Minimal calcified plaque at the right ICA origin and bulb without stenosis. Left carotid system: Mildly tortuous proximal left CCA. Mild calcified plaque at the left ICA origin and bulb without stenosis. Vertebral arteries: Calcified plaque and tortuosity at the right subclavian artery origin  without significant stenosis. Normal right vertebral artery origin. Tortuous right V1 segment. Patent right vertebral to the skull base without stenosis. Mildly tortuous proximal left subclavian artery without significant plaque. Normal left vertebral artery origin. Mildly tortuous left V1 segment. Codominant left vertebral is patent to the skull base without plaque or stenosis. CTA HEAD Posterior circulation: Patent right vertebral V4 segment to the vertebrobasilar junction without stenosis. Patent right PICA origin. Normal left PICA origin. Beyond the left PICA in the left V4 segment there is evidence of soft plaque on series 7, image 159 but only mild associated stenosis. Patent left vertebrobasilar junction. Patent basilar artery with mild  irregularity but no stenosis. Patent SCA and left PCA origins. Fetal right PCA origin. Left posterior communicating artery diminutive or absent. Mild to moderate left PCA P2 segment irregularity and stenosis on series 12, image 23. Mild right PCA irregularity without stenosis. Anterior circulation: Both ICA siphons are patent without plaque or stenosis. Normal ophthalmic and right posterior communicating artery origins. Patent carotid termini. Patent MCA and ACA origins. Diminutive or absent anterior communicating artery. Mild bilateral ACA irregularity. No proximal ACA stenosis. Up to moderate distal ACA irregularity and stenosis on series 12, image 21. Right MCA M1 segment is patent to the bifurcation with mild irregularity and stenosis. At the right MCA bifurcation there is moderate stenosis. Right MCA M2 branches are patent. There is moderate right posterior MCA M2 and M3 branch irregularity and stenosis (series 12, image 12. Moderate right anterior M3 irregularity and stenosis. No right MCA branch occlusion identified. Mild to moderate irregularity with mild stenosis of the distal left MCA M1 (series 7, image 116). Left MCA bifurcation is patent. There is no M2 branch occlusion. Only mild proximal M2 irregularity. There is more moderate irregularity of the distal posterior M2 with mild to moderate stenosis (series 12, image 29). No right MCA branch occlusion is identified. Venous sinuses: Early contrast timing, not well evaluated. Anatomic variants: Fetal right PCA origin. Review of the MIP images confirms the above findings IMPRESSION: 1. Negative for large vessel occlusion, and no core infarct or ischemic penumbra detected by CT Perfusion. 2. Mild for age extracranial but fairly advanced intracranial atherosclerosis. Up to moderate circle-of-Willis branch irregularity and stenoses in the bilateral MCAs (M2 and M3), left PCA (P2), and distal ACAs 3. Preliminary results of the above discussed by telephone with  Dr. Otelia Limes at [phone] hours. Final results were communicated to Dr. Otelia Limes at [phone] hours 03/12/2020 by text page via the [facility] messaging system. Electronically Signed   By: Odessa Fleming M.D.   On: 03/12/2020 09:55   DG Swallowing Func-Speech Pathology  Result Date: 03/13/2020 Objective Swallowing Evaluation: Type of Study: MBS-Modified Barium Swallow Study  Patient Details Name: IRMA DELANCEY MRN: [id number] Date of Birth: [birth year] Today's Date: 03/13/2020 Time: SLP Start Time (ACUTE ONLY): 1000 -SLP Stop Time (ACUTE ONLY): 1024 SLP Time Calculation (min) (ACUTE ONLY): 24 min Past Medical History: Past Medical History: Diagnosis Date . 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 CONGESTIVE HEART FAILURE 11/12/2007 . DIABETES MELLITUS, TYPE II 11/20/2008 . HYPERLIPIDEMIA 11/12/2007 . HYPERTENSION 11/12/2007 . TACHYCARDIA 01/24/2010 Past Surgical History: Past Surgical History: Procedure Laterality Date . TONSILLECTOMY  1927 HPI: 64 year old male with past medical history of diabetes mellitus type 2, coronary artery disease (cath 2009 with nonobstructive disease), systolic and diastolic congestive heart failure, hyperlipidemia, hypertension and medical noncompliance who presented to [facility] emergency department 11/18 with complaints of shortness of breath. Code stroke called am 11/19 due  to acute right sided weakness and dysarthria.  W/u pending. Failed RN stroke swallow screen.  Subjective: alert Assessment / Plan / Recommendation CHL IP CLINICAL IMPRESSIONS 03/13/2020 Clinical Impression Pt presents with a primary oral dysphagia with right CN VII and XII weakness with senory and motor impairment leading to difficult lingual manipulation of solids and right sided anterior bolus loss and residue. Oral phase contributes to mild pharyngeal dysphagia with premature spillage and at times, delayed swallow initiation. WIth thin liquids there are instances of silent frank penetration with ejection and trace sensed aspiration  with ejection. Pt is dyspneic and mildy impulsive with poor trunk control for adequate positioning and impaired self feeding. Over the weekend recommend a conservative diet of puree and nectar thick liquids, though given full airway protection expect pt to upgrade to thin liquids under supervision. Recommend CIR, will f/u for tolerance of diet.  SLP Visit Diagnosis Dysphagia, oropharyngeal phase (R13.12) Attention and concentration deficit following -- Frontal lobe and executive function deficit following -- Impact on safety and function Moderate aspiration risk   CHL IP TREATMENT RECOMMENDATION 03/12/2020 Treatment Recommendations Therapy as outlined in treatment plan below   Prognosis 03/13/2020 Prognosis for Safe Diet Advancement Good Barriers to Reach Goals -- Barriers/Prognosis Comment -- CHL IP DIET RECOMMENDATION 03/13/2020 SLP Diet Recommendations Dysphagia 2 (Fine chop) solids;Nectar thick liquid Liquid Administration via Cup;Straw Medication Administration Whole meds with puree Compensations Minimize environmental distractions;Lingual sweep for clearance of pocketing;Monitor for anterior loss Postural Changes Seated upright at 90 degrees;Remain semi-upright after after feeds/meals (Comment)   CHL IP OTHER RECOMMENDATIONS 03/13/2020 Recommended Consults -- Oral Care Recommendations Oral care BID Other Recommendations --   CHL IP FOLLOW UP RECOMMENDATIONS 03/13/2020 Follow up Recommendations Inpatient Rehab   CHL IP FREQUENCY AND DURATION 03/13/2020 Speech Therapy Frequency (ACUTE ONLY) min 2x/week Treatment Duration 1 week      CHL IP ORAL PHASE 03/13/2020 Oral Phase Impaired Oral - Pudding Teaspoon -- Oral - Pudding Cup -- Oral - Honey Teaspoon -- Oral - Honey Cup -- Oral - Nectar Teaspoon -- Oral - Nectar Cup Lingual/palatal residue;Right pocketing in lateral sulci Oral - Nectar Straw Lingual/palatal residue;Right pocketing in lateral sulci;Other (Comment) Oral - Thin Teaspoon -- Oral - Thin Cup Right  anterior bolus loss;Right pocketing in lateral sulci;Lingual/palatal residue;Decreased bolus cohesion;Premature spillage;Other (Comment) Oral - Thin Straw Right anterior bolus loss;Right pocketing in lateral sulci;Lingual/palatal residue;Decreased bolus cohesion;Premature spillage;Other (Comment) Oral - Puree Right pocketing in lateral sulci;Lingual/palatal residue Oral - Mech Soft Decreased bolus cohesion;Lingual/palatal residue;Reduced posterior propulsion;Weak lingual manipulation;Impaired mastication Oral - Regular -- Oral - Multi-Consistency -- Oral - Pill WFL Oral Phase - Comment --  CHL IP PHARYNGEAL PHASE 03/13/2020 Pharyngeal Phase Impaired Pharyngeal- Pudding Teaspoon -- Pharyngeal -- Pharyngeal- Pudding Cup -- Pharyngeal -- Pharyngeal- Honey Teaspoon -- Pharyngeal -- Pharyngeal- Honey Cup -- Pharyngeal -- Pharyngeal- Nectar Teaspoon -- Pharyngeal -- Pharyngeal- Nectar Cup Delayed swallow initiation-pyriform sinuses;Delayed swallow initiation-vallecula Pharyngeal -- Pharyngeal- Nectar Straw Delayed swallow initiation-vallecula;Delayed swallow initiation-pyriform sinuses Pharyngeal -- Pharyngeal- Thin Teaspoon -- Pharyngeal -- Pharyngeal- Thin Cup Delayed swallow initiation-pyriform sinuses;Penetration/Aspiration before swallow Pharyngeal Material enters airway, CONTACTS cords and then ejected out;Material enters airway, passes BELOW cords then ejected out;Material does not enter airway Pharyngeal- Thin Straw Delayed swallow initiation-pyriform sinuses;Penetration/Aspiration before swallow Pharyngeal Material enters airway, CONTACTS cords and then ejected out;Material enters airway, passes BELOW cords then ejected out;Material does not enter airway Pharyngeal- Puree Delayed swallow initiation-vallecula Pharyngeal -- Pharyngeal- Mechanical Soft Delayed swallow initiation-vallecula Pharyngeal -- Pharyngeal- Regular --  Pharyngeal -- Pharyngeal- Multi-consistency -- Pharyngeal -- Pharyngeal- Pill Delayed  swallow initiation-vallecula Pharyngeal -- Pharyngeal Comment --  No flowsheet data found. Harlon Ditty, MA CCC-SLP Acute Rehabilitation Services Pager [phone] Office [phone] Claudine Mouton 03/13/2020, 10:43 AM              ECHOCARDIOGRAM COMPLETE  Result Date: 03/12/2020    ECHOCARDIOGRAM REPORT   Patient Name:   SLYVESTER LATONA Haile Date of Exam: 03/12/2020 Medical Rec #:  [id number]     Height:       71.0 in Accession #:    [id number]    Weight:       251.1 lb Date of Birth:  June 07, 1955     BSA:          2.323 m Patient Age:    64 years      BP:           200/116 mmHg Patient Gender: M             HR:           91 bpm. Exam Location:  Inpatient Procedure: 2D Echo, Cardiac Doppler, Color Doppler and Intracardiac            Opacification Agent Indications:    CHF-Acute Systolic 428.21 / I50.21  History:        Patient has prior history of Echocardiogram examinations, most                 recent 09/15/2014. CHF and Cardiomyopathy; Risk                 Factors:Hypertension, Diabetes, Dyslipidemia and Non-Smoker.  Sonographer:    Renella Cunas RDCS Referring Phys: [id number] Deno Lunger [facility]  Sonographer Comments: Technically difficult study due to poor echo windows. IMPRESSIONS  1. Left ventricular ejection fraction, by estimation, is 45 to 50%. The left ventricle has mildly decreased function. The left ventricle demonstrates global hypokinesis. There is mild concentric left ventricular hypertrophy. Left ventricular diastolic parameters are consistent with Grade I diastolic dysfunction (impaired relaxation).  2. Right ventricular systolic function is normal. The right ventricular size is normal. Tricuspid regurgitation signal is inadequate for assessing PA pressure.  3. The mitral valve is grossly normal. Trivial mitral valve regurgitation. No evidence of mitral stenosis.  4. The aortic valve is tricuspid. Aortic valve regurgitation is not visualized. No aortic stenosis is present.  5. The inferior vena  cava is normal in size with greater than 50% respiratory variability, suggesting right atrial pressure of 3 mmHg. Comparison(s): Changes from prior study are noted. EF mildly reduced 45-50% with global HK. FINDINGS  Left Ventricle: Left ventricular ejection fraction, by estimation, is 45 to 50%. The left ventricle has mildly decreased function. The left ventricle demonstrates global hypokinesis. Definity contrast agent was given IV to delineate the left ventricular  endocardial borders. The left ventricular internal cavity size was normal in size. There is mild concentric left ventricular hypertrophy. Left ventricular diastolic parameters are consistent with Grade I diastolic dysfunction (impaired relaxation). Normal left ventricular filling pressure. Right Ventricle: The right ventricular size is normal. No increase in right ventricular wall thickness. Right ventricular systolic function is normal. Tricuspid regurgitation signal is inadequate for assessing PA pressure. Left Atrium: Left atrial size was normal in size. Right Atrium: Right atrial size was normal in size. Pericardium: Trivial pericardial effusion is present. Mitral Valve: The mitral valve is grossly normal. Trivial mitral valve regurgitation. No evidence of mitral valve stenosis. Tricuspid Valve: The  tricuspid valve is grossly normal. Tricuspid valve regurgitation is trivial. No evidence of tricuspid stenosis. Aortic Valve: The aortic valve is tricuspid. Aortic valve regurgitation is not visualized. No aortic stenosis is present. Pulmonic Valve: The pulmonic valve was grossly normal. Pulmonic valve regurgitation is not visualized. No evidence of pulmonic stenosis. Aorta: The aortic root is normal in size and structure. Venous: The inferior vena cava is normal in size with greater than 50% respiratory variability, suggesting right atrial pressure of 3 mmHg. IAS/Shunts: The atrial septum is grossly normal.  LEFT VENTRICLE PLAX 2D LVIDd:         5.70 cm       Diastology LVIDs:         4.40 cm      LV e' medial:    3.83 cm/s LV PW:         1.10 cm      LV E/e' medial:  10.6 LV IVS:        1.10 cm      LV e' lateral:   4.88 cm/s LVOT diam:     2.20 cm      LV E/e' lateral: 8.3 LV SV:         33 LV SV Index:   14 LVOT Area:     3.80 cm  LV Volumes (MOD) LV vol d, MOD A4C: 193.0 ml LV vol s, MOD A4C: 121.0 ml LV SV MOD A4C:     193.0 ml LEFT ATRIUM           Index LA diam:      4.50 cm 1.94 cm/m LA Vol (A2C): 52.7 ml 22.69 ml/m LA Vol (A4C): 46.7 ml 20.11 ml/m  AORTIC VALVE LVOT Vmax:   57.80 cm/s LVOT Vmean:  39.600 cm/s LVOT VTI:    0.086 m  AORTA Ao Root diam: 3.40 cm MITRAL VALVE MV Area (PHT): 2.18 cm    SHUNTS MV Decel Time: 348 msec    Systemic VTI:  0.09 m MV E velocity: 40.70 cm/s  Systemic Diam: 2.20 cm MV A velocity: 49.00 cm/s MV E/A ratio:  0.83 Lennie Odor MD Electronically signed by Lennie Odor MD Signature Date/Time: 03/12/2020/12:24:17 PM    Final    ECHO TEE  Result Date: 03/16/2020    TRANSESOPHOGEAL ECHO REPORT   Patient Name:   SUZANNE GARBERS Rasmussen Date of Exam: 03/16/2020 Medical Rec #:  [id number]     Height:       71.0 in Accession #:    [id number]    Weight:       239.2 lb Date of Birth:  10/10/1955     BSA:          2.275 m Patient Age:    64 years      BP:           150/119 mmHg Patient Gender: M             HR:           86 bpm. Exam Location:  Inpatient Procedure: Transesophageal Echo, Cardiac Doppler and Color Doppler Indications:     Stroke  History:         Patient has prior history of Echocardiogram examinations, most                  recent 03/12/2020. CHF and Cardiomyopathy; Risk                  Factors:Hypertension, Diabetes, Dyslipidemia and Non-Smoker.  Sonographer:     Ross Ludwig RDCS (AE) Referring Phys:  [id number] Tacey Ruiz DUNN Diagnosing Phys: Jodelle Red MD PROCEDURE: After discussion of the risks and benefits of a TEE, an informed consent was obtained from the patient. The transesophogeal probe was passed without  difficulty through the esophogus of the patient. Local oropharyngeal anesthetic was provided with Cetacaine. Sedation performed by different physician. The patient was monitored while under deep sedation. Anesthestetic sedation was provided intravenously by Anesthesiology: 372.79mg  of Propofol. Image quality was adequate. The patient developed no complications during the procedure. IMPRESSIONS  1. Left ventricular ejection fraction, by estimation, is 45 to 50%. The left ventricle has mildly decreased function. The left ventricle demonstrates global hypokinesis.  2. Right ventricular systolic function is normal. The right ventricular size is normal.  3. No left atrial/left atrial appendage thrombus was detected.  4. The mitral valve is normal in structure. Mild to moderate mitral valve regurgitation. No evidence of mitral stenosis.  5. The aortic valve is tricuspid. Aortic valve regurgitation is trivial. No aortic stenosis is present.  6. There is Moderate (Grade III) plaque involving the descending aorta.  7. Evidence of atrial level shunting detected by color flow Doppler. Agitated saline contrast bubble study was negative, with no evidence of any interatrial shunt. There is a small patent foramen ovale with predominantly left to right shunting across the atrial septum. Conclusion(s)/Recommendation(s): Very small left to right intra-atrial flow seen by color Doppler. However, negative bubble study. This suggests small ASD/PFO without right to left shunting. FINDINGS  Left Ventricle: Left ventricular ejection fraction, by estimation, is 45 to 50%. The left ventricle has mildly decreased function. The left ventricle demonstrates global hypokinesis. The left ventricular internal cavity size was normal in size. Right Ventricle: The right ventricular size is normal. No increase in right ventricular wall thickness. Right ventricular systolic function is normal. Left Atrium: Left atrial size was not assessed. No left  atrial/left atrial appendage thrombus was detected. Right Atrium: Right atrial size was not assessed. Pericardium: There is no evidence of pericardial effusion. Mitral Valve: The mitral valve is normal in structure. Mild to moderate mitral valve regurgitation. No evidence of mitral valve stenosis. Tricuspid Valve: The tricuspid valve is normal in structure. Tricuspid valve regurgitation is mild . No evidence of tricuspid stenosis. Aortic Valve: The aortic valve is tricuspid. Aortic valve regurgitation is trivial. No aortic stenosis is present. Pulmonic Valve: The pulmonic valve was grossly normal. Pulmonic valve regurgitation is trivial. No evidence of pulmonic stenosis. Aorta: The aortic root and ascending aorta are structurally normal, with no evidence of dilitation. There is moderate (Grade III) plaque involving the descending aorta. IAS/Shunts: Evidence of atrial level shunting detected by color flow Doppler. Agitated saline contrast was given intravenously to evaluate for intracardiac shunting. Agitated saline contrast bubble study was negative, with no evidence of any interatrial shunt. A small patent foramen ovale is detected with predominantly left to right shunting across the atrial septum.  TRICUSPID VALVE TR Peak grad:   23.8 mmHg TR Vmax:        244.00 cm/s Jodelle Red MD Electronically signed by Jodelle Red MD Signature Date/Time: 03/16/2020/3:55:39 PM    Final    DG HIP UNILAT WITH PELVIS 2-3 VIEWS RIGHT  Result Date: 03/11/2020 CLINICAL DATA:  Fall EXAM: DG HIP (WITH OR WITHOUT PELVIS) 2-3V RIGHT COMPARISON:  None. FINDINGS: Image quality degraded by patient body habitus. Subcapital band of sclerosis and questionable transcortical lucency of the right femoral neck with possible  impaction. Best seen on the frogleg lateral view. Femoral head remains normally located albeit severe arthrosis and bone-on-bone contact along the superior acetabular margin. Additional severe  degenerative changes in the left hip as well. Both hips demonstrate extensive subcortical cystic change, sclerotic features and geode formations. More diffuse enthesopathic changes about the hips and pelvis. Degenerative features in the lower lumbar spine. Mild soft tissue swelling noted over the lateral right hip as well as mild diffuse body wall edema. IMPRESSION: 1. Subcapital band of sclerosis and questionable transcortical lucency of the right femoral neck, suspicious for minimally impacted subcapital femur fracture. Right lateral hip swelling. 2. 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 3. Additional enthesopathic changes pelvis and degenerative changes in the spine. 4. Mild body wall edema. Electronically Signed   By: Kreg Shropshire M.D.   On: 03/11/2020 21:37   CT HEAD CODE STROKE WO CONTRAST  Result Date: 03/12/2020 CLINICAL DATA:  Code stroke. 63 year old male with right side weakness and facial droop. EXAM: CT HEAD WITHOUT CONTRAST TECHNIQUE: Contiguous axial images were obtained from the base of the skull through the vertex without intravenous contrast. COMPARISON:  None. FINDINGS: Brain: Scattered bilateral Patchy and confluent cerebral white matter hypodensity with similar heterogeneity in the left basal ganglia and bilateral thalami. No acute intracranial hemorrhage identified. No midline shift, mass effect, or evidence of intracranial mass lesion. No changes of acute cortically based infarct identified. Incidental cavum septum pellucidum (normal variant). Vascular: Calcified atherosclerosis at the skull base. Hyperdense left MCA branch in the left sylvian fissure on series 3, image 20. Skull: No acute osseous abnormality identified. Sinuses/Orbits: Occasional sinus mucosal thickening or small retention cysts. Tympanic cavities and mastoids are well aerated. Other: Leftward gaze. No other acute orbit or scalp soft tissue finding. ASPECTS [facility] Stroke Program Early CT  Score) Total score (0-10 with 10 being normal): 10 IMPRESSION: 1. Hyperdense Left MCA branch in the left Sylvian fissure suspicious for emergent large vessel occlusion. 2. No acute cortically based infarct or acute intracranial hemorrhage identified. ASPECTS 10. 3. Evidence of advanced underlying small vessel disease. 4. Study discussed by telephone with Dr. Otelia Limes on 03/12/2020 at 08:53 . Electronically Signed   By: Odessa Fleming M.D.   On: 03/12/2020 08:55   VAS [place] LOWER EXTREMITY VENOUS (DVT)  Result Date: 03/19/2020  Lower Venous DVT Study Indications: Stroke.  Comparison Study: No prior study Performing Technologist: Gertie Fey MHA, RDMS, RVT, RDCS  Examination Guidelines: A complete evaluation includes B-mode imaging, spectral Doppler, color Doppler, and power Doppler as needed of all accessible portions of each vessel. Bilateral testing is considered an integral part of a complete examination. Limited examinations for reoccurring indications may be performed as noted. The reflux portion of the exam is performed with the patient in reverse Trendelenburg.  +---------+---------------+---------+-----------+----------+--------------+ RIGHT    CompressibilityPhasicitySpontaneityPropertiesThrombus Aging +---------+---------------+---------+-----------+----------+--------------+ CFV      Full           Yes      Yes        patent                   +---------+---------------+---------+-----------+----------+--------------+ FV Prox  Full                               patent                   +---------+---------------+---------+-----------+----------+--------------+ FV Mid   Full  patent                   +---------+---------------+---------+-----------+----------+--------------+ FV Distal                        Yes        patent                   +---------+---------------+---------+-----------+----------+--------------+ POP                      Yes      Yes        patent                   +---------+---------------+---------+-----------+----------+--------------+ PTV      Full                               patent                   +---------+---------------+---------+-----------+----------+--------------+ PERO     Full                               patent                   +---------+---------------+---------+-----------+----------+--------------+   Right Technical Findings: Not visualized segments include SFJ, PFV.  +---------+---------------+---------+-----------+----------+--------------+ LEFT     CompressibilityPhasicitySpontaneityPropertiesThrombus Aging +---------+---------------+---------+-----------+----------+--------------+ CFV      Full           Yes      Yes                                 +---------+---------------+---------+-----------+----------+--------------+ SFJ      Full                                                        +---------+---------------+---------+-----------+----------+--------------+ FV Prox  Full                                                        +---------+---------------+---------+-----------+----------+--------------+ FV Mid   Full                                                        +---------+---------------+---------+-----------+----------+--------------+ FV DistalFull                                                        +---------+---------------+---------+-----------+----------+--------------+ PFV      Full                                                        +---------+---------------+---------+-----------+----------+--------------+  POP      Full           Yes      Yes                                 +---------+---------------+---------+-----------+----------+--------------+ PTV      Full                                                        +---------+---------------+---------+-----------+----------+--------------+  PERO     Full                                                        +---------+---------------+---------+-----------+----------+--------------+     Summary: RIGHT: - There is no evidence of deep vein thrombosis in the lower extremity. However, portions of this examination were limited- see technologist comments above.  - No cystic structure found in the popliteal fossa.  LEFT: - There is no evidence of deep vein thrombosis in the lower extremity.  - No cystic structure found in the popliteal fossa.  *See table(s) above for measurements and observations. Electronically signed by Heath Lark on 03/19/2020 at 2:49:03 PM.    Final        Subjective: No new complaints.   Discharge Exam: Vitals:   03/28/20 0517 03/28/20 0800  BP: 121/83 127/85  Pulse: 75 83  Resp: 18 15  Temp: 98.6 F (37 C)   SpO2: 96% 100%   Vitals:   03/27/20 1532 03/27/20 2004 03/28/20 0517 03/28/20 0800  BP: 97/69 113/81 121/83 127/85  Pulse: 79 81 75 83  Resp: 18 18 18 15   Temp: 97.6 F (36.4 C) 99 F (37.2 C) 98.6 F (37 C)   TempSrc: Oral Oral Oral   SpO2: 99% 99% 96% 100%  Weight:   111.2 kg   Height:        General: Pt is alert, awake, not in acute distress Cardiovascular: RRR, S1/S2 +, no rubs, no gallops Respiratory: CTA bilaterally, no wheezing, no rhonchi Abdominal: Soft, NT, ND, bowel sounds + Extremities: no edema, no cyanosis    The results of significant diagnostics from this hospitalization (including imaging, microbiology, ancillary and laboratory) are listed below for reference.     Microbiology: Recent Results (from the past 240 hour(s))  SARS Coronavirus 2 by RT PCR (hospital order, performed in [facility] hospital lab) Nasopharyngeal Nasopharyngeal Swab     Status: None   Collection Time: 03/23/20 11:45 AM   Specimen: Nasopharyngeal Swab  Result Value Ref Range Status   SARS Coronavirus 2 NEGATIVE NEGATIVE Final    Comment: (NOTE) SARS-CoV-2 target nucleic acids are  NOT DETECTED.  The SARS-CoV-2 RNA is generally detectable in upper and lower respiratory specimens during the acute phase of infection. The lowest concentration of SARS-CoV-2 viral copies this assay can detect is 250 copies / mL. A negative result does not preclude SARS-CoV-2 infection and should not be used as the sole basis for treatment or other patient management decisions.  A negative result may occur with improper specimen collection / handling, submission of specimen other than nasopharyngeal swab,  presence of viral mutation(s) within the areas targeted by this assay, and inadequate number of viral copies (<250 copies / mL). A negative result must be combined with clinical observations, patient history, and epidemiological information.  Fact Sheet for Patients:   BoilerBrush.com.cy  Fact Sheet for Healthcare Providers: https://pope.com/  This test is not yet approved or  cleared by the [place] FDA and has been authorized for detection and/or diagnosis of SARS-CoV-2 by FDA under an Emergency Use Authorization (EUA).  This EUA will remain in effect (meaning this test can be used) for the duration of the COVID-19 declaration under Section 564(b)(1) of the Act, 21 U.S.C. section 360bbb-3(b)(1), unless the authorization is terminated or revoked sooner.  Performed at [facility], [address] [place], [place] [address]      Labs: BNP (last 3 results) Recent Labs    03/11/20 1854  BNP 705.4*   Basic Metabolic Panel: Recent Labs  Lab 03/28/20 0142  NA 137  K 3.8  CL 102  CO2 27  GLUCOSE 118*  BUN 22  CREATININE 1.56*  CALCIUM 8.8*   Liver Function Tests: No results for input(s): AST, ALT, ALKPHOS, BILITOT, PROT, ALBUMIN in the last 168 hours. No results for input(s): LIPASE, AMYLASE in the last 168 hours. No results for input(s): AMMONIA in the last 168 hours. CBC: No results for input(s): WBC,  NEUTROABS, HGB, HCT, MCV, PLT in the last 168 hours. Cardiac Enzymes: No results for input(s): CKTOTAL, CKMB, CKMBINDEX, TROPONINI in the last 168 hours. BNP: Invalid input(s): POCBNP CBG: Recent Labs  Lab 03/27/20 0602 03/27/20 1108 03/27/20 1609 03/27/20 2153 03/28/20 0629  GLUCAP 106* 116* 182* 169* 100*   D-Dimer No results for input(s): DDIMER in the last 72 hours. Hgb A1c No results for input(s): HGBA1C in the last 72 hours. Lipid Profile No results for input(s): CHOL, HDL, LDLCALC, TRIG, CHOLHDL, LDLDIRECT in the last 72 hours. Thyroid function studies No results for input(s): TSH, T4TOTAL, T3FREE, THYROIDAB in the last 72 hours.  Invalid input(s): FREET3 Anemia work up No results for input(s): VITAMINB12, FOLATE, FERRITIN, TIBC, IRON, RETICCTPCT in the last 72 hours. Urinalysis    Component Value Date/Time   COLORURINE STRAW (A) 03/12/2020 0444   APPEARANCEUR CLEAR 03/12/2020 0444   LABSPEC 1.008 03/12/2020 0444   PHURINE 7.0 03/12/2020 0444   GLUCOSEU NEGATIVE 03/12/2020 0444   GLUCOSEU NEGATIVE 05/03/2017 1231   HGBUR MODERATE (A) 03/12/2020 0444   BILIRUBINUR NEGATIVE 03/12/2020 0444   KETONESUR 5 (A) 03/12/2020 0444   PROTEINUR 100 (A) 03/12/2020 0444   UROBILINOGEN 0.2 05/03/2017 1231   NITRITE NEGATIVE 03/12/2020 0444   LEUKOCYTESUR NEGATIVE 03/12/2020 0444   Sepsis Labs Invalid input(s): PROCALCITONIN,  WBC,  LACTICIDVEN Microbiology Recent Results (from the past 240 hour(s))  SARS Coronavirus 2 by RT PCR (hospital order, performed in [facility] hospital lab) Nasopharyngeal Nasopharyngeal Swab     Status: None   Collection Time: 03/23/20 11:45 AM   Specimen: Nasopharyngeal Swab  Result Value Ref Range Status   SARS Coronavirus 2 NEGATIVE NEGATIVE Final    Comment: (NOTE) SARS-CoV-2 target nucleic acids are NOT DETECTED.  The SARS-CoV-2 RNA is generally detectable in upper and lower respiratory specimens during the acute phase of infection. The  lowest concentration of SARS-CoV-2 viral copies this assay can detect is 250 copies / mL. A negative result does not preclude SARS-CoV-2 infection and should not be used as the sole basis for treatment or other patient management decisions.  A negative result may occur with improper specimen collection / handling, submission of specimen other than nasopharyngeal swab, presence of viral mutation(s) within the areas targeted by this assay, and inadequate number of viral copies (<250 copies / mL). A negative result must be combined with clinical observations, patient history, and epidemiological information.  Fact Sheet for Patients:   BoilerBrush.com.cy  Fact Sheet for Healthcare Providers: https://pope.com/  This test is not yet approved or  cleared by the [place] FDA and has been authorized for detection and/or diagnosis of SARS-CoV-2 by FDA under an Emergency Use Authorization (EUA).  This EUA will remain in effect (meaning this test can be used) for the duration of the COVID-19 declaration under Section 564(b)(1) of the Act, 21 U.S.C. section 360bbb-3(b)(1), unless the authorization is terminated or revoked sooner.  Performed at [facility], [address], [place], [place] [address]      Time coordinating discharge: Over 30 minutes  SIGNED:   Kathlen Mody, MD  [facility] 03/28/2020, 9:01 AM

## 2020-03-29 LAB — GLUCOSE, CAPILLARY: Glucose-Capillary: 91 mg/dL (ref 70–99)

## 2020-03-29 NOTE — TOC Transition Note (Addendum)
Transition of Care [facility]) - CM/SW Discharge Note   Patient Details  Name: Trevor Anderson MRN: [id number] Date of Birth: August 17, 1955  Transition [facility]) CM/SW Contact:  Leone Haven, RN Phone Number: 03/29/2020, 9:22 AM   Clinical Narrative:     Pt would be a walk-in at would need to arrive fairly early (around 7 am) in order to ensure pt would be seen. Once pt is seen he will be eligible for services through the [place] (in this case [facility] services) and will be assigned a PCP. Pt's son states that pt's sister told RNCM that she would be able to transport pt but when pt's son asked pt's sister directly, pt's sister stated she would not be able to transport pt. Pt's son states pt will need transportation to the [place] in [place] at [employer]. CSW inquired whether or not pt could sit in a car and both pt and son denied pt being able to sit in a car comfortably/safely. CSW advised that CSW could look into medical transport services for pt but without insurance, pt will more than likely be responsible for payment upfront.  CSW reached out to [employer], they are able to pick pt up at 10:00 am and transport pt to the [place] and then home. A quote of $155 was given for the roundtrip. CSW provided this information to pt's son who agreed. Appointment was made for pt to be picked up from the [facility] entrance A. MD made aware of updated dc plans per CSW note.  NCM left vm with Laquita at the [facility], will need fax number to fax the dc summary to so they will have it when patient gets there.  Awaiting a call back. [facility] , they said to [facility] dc summary to [place] [facility] at [phone].  12/7- NCM received call from [place] with the [facility] asking this NCM to fax her the h/p, meds, and therapy notes with progress  Notes to [phone].  NCM will fax this to her.   Final next level of care: Home/Self Care Barriers to Discharge: No Barriers  Identified   Patient Goals and CMS Choice Patient states their goals for this hospitalization and ongoing recovery are:: rehab CMS Medicare.gov Compare Post Acute Care list provided to:: Patient Represenative (must comment) Choice offered to / list presented to : Adult Children, Sibling  Discharge Placement                       Discharge Plan and Services In-house Referral: Clinical Social Work Discharge Planning Services: CM Consult Post Acute Care Choice: IP Rehab            DME Agency: NA       HH Arranged: NA          Social Determinants of Health (SDOH) Interventions     Readmission Risk Interventions No flowsheet data found.

## 2020-03-29 NOTE — Plan of Care (Signed)
Problem: Education: Goal: Ability to demonstrate management of disease process will improve 03/29/2020 0930 by Nadean Corwin, RN Outcome: Adequate for Discharge 03/29/2020 0930 by Nadean Corwin, RN Outcome: Progressing Goal: Ability to verbalize understanding of medication therapies will improve 03/29/2020 0930 by Nadean Corwin, RN Outcome: Adequate for Discharge 03/29/2020 0930 by Nadean Corwin, RN Outcome: Progressing Goal: Individualized Educational Video(s) 03/29/2020 0930 by Nadean Corwin, RN Outcome: Adequate for Discharge 03/29/2020 0930 by Nadean Corwin, RN Outcome: Progressing   Problem: Activity: Goal: Capacity to carry out activities will improve 03/29/2020 0930 by Nadean Corwin, RN Outcome: Adequate for Discharge 03/29/2020 0930 by Nadean Corwin, RN Outcome: Progressing   Problem: Cardiac: Goal: Ability to achieve and maintain adequate cardiopulmonary perfusion will improve 03/29/2020 0930 by Nadean Corwin, RN Outcome: Adequate for Discharge 03/29/2020 0930 by Nadean Corwin, RN Outcome: Progressing   Problem: Education: Goal: Knowledge of General Education information will improve Description: Including pain rating scale, medication(s)/side effects and non-pharmacologic comfort measures 03/29/2020 0930 by Nadean Corwin, RN Outcome: Adequate for Discharge 03/29/2020 0930 by Nadean Corwin, RN Outcome: Progressing   Problem: Health Behavior/Discharge Planning: Goal: Ability to manage health-related needs will improve 03/29/2020 0930 by Nadean Corwin, RN Outcome: Adequate for Discharge 03/29/2020 0930 by Nadean Corwin, RN Outcome: Progressing   Problem: Clinical Measurements: Goal: Ability to maintain clinical measurements within normal limits will improve 03/29/2020 0930 by Nadean Corwin, RN Outcome: Adequate for Discharge 03/29/2020 0930 by Nadean Corwin, RN Outcome: Progressing Goal: Will remain free from  infection 03/29/2020 0930 by Nadean Corwin, RN Outcome: Adequate for Discharge 03/29/2020 0930 by Nadean Corwin, RN Outcome: Progressing Goal: Diagnostic test results will improve 03/29/2020 0930 by Nadean Corwin, RN Outcome: Adequate for Discharge 03/29/2020 0930 by Nadean Corwin, RN Outcome: Progressing Goal: Respiratory complications will improve 03/29/2020 0930 by Nadean Corwin, RN Outcome: Adequate for Discharge 03/29/2020 0930 by Nadean Corwin, RN Outcome: Progressing Goal: Cardiovascular complication will be avoided 03/29/2020 0930 by Nadean Corwin, RN Outcome: Adequate for Discharge 03/29/2020 0930 by Nadean Corwin, RN Outcome: Progressing   Problem: Activity: Goal: Risk for activity intolerance will decrease 03/29/2020 0930 by Nadean Corwin, RN Outcome: Adequate for Discharge 03/29/2020 0930 by Nadean Corwin, RN Outcome: Progressing   Problem: Nutrition: Goal: Adequate nutrition will be maintained 03/29/2020 0930 by Nadean Corwin, RN Outcome: Adequate for Discharge 03/29/2020 0930 by Nadean Corwin, RN Outcome: Progressing   Problem: Coping: Goal: Level of anxiety will decrease 03/29/2020 0930 by Nadean Corwin, RN Outcome: Adequate for Discharge 03/29/2020 0930 by Nadean Corwin, RN Outcome: Progressing   Problem: Elimination: Goal: Will not experience complications related to bowel motility 03/29/2020 0930 by Nadean Corwin, RN Outcome: Adequate for Discharge 03/29/2020 0930 by Nadean Corwin, RN Outcome: Progressing Goal: Will not experience complications related to urinary retention 03/29/2020 0930 by Nadean Corwin, RN Outcome: Adequate for Discharge 03/29/2020 0930 by Nadean Corwin, RN Outcome: Progressing   Problem: Pain Managment: Goal: General experience of comfort will improve 03/29/2020 0930 by Nadean Corwin, RN Outcome: Adequate for Discharge 03/29/2020 0930 by Nadean Corwin, RN Outcome: Progressing    Problem: Safety: Goal: Ability to remain free from injury will improve 03/29/2020 0930 by Nadean Corwin, RN Outcome: Adequate for Discharge 03/29/2020 0930 by Nadean Corwin, RN Outcome: Progressing   Problem: Skin Integrity: Goal: Risk for impaired skin integrity  will decrease 03/29/2020 0930 by Nadean Corwin, RN Outcome: Adequate for Discharge 03/29/2020 0930 by Nadean Corwin, RN Outcome: Progressing   Problem: Education: Goal: Knowledge of disease or condition will improve 03/29/2020 0930 by Nadean Corwin, RN Outcome: Adequate for Discharge 03/29/2020 0930 by Nadean Corwin, RN Outcome: Progressing Goal: Knowledge of secondary prevention will improve 03/29/2020 0930 by Nadean Corwin, RN Outcome: Adequate for Discharge 03/29/2020 0930 by Nadean Corwin, RN Outcome: Progressing Goal: Knowledge of patient specific risk factors addressed and post discharge goals established will improve 03/29/2020 0930 by Nadean Corwin, RN Outcome: Adequate for Discharge 03/29/2020 0930 by Nadean Corwin, RN Outcome: Progressing Goal: Individualized Educational Video(s) 03/29/2020 0930 by Nadean Corwin, RN Outcome: Adequate for Discharge 03/29/2020 0930 by Nadean Corwin, RN Outcome: Progressing   Problem: Coping: Goal: Will verbalize positive feelings about self 03/29/2020 0930 by Nadean Corwin, RN Outcome: Adequate for Discharge 03/29/2020 0930 by Nadean Corwin, RN Outcome: Progressing Goal: Will identify appropriate support needs 03/29/2020 0930 by Nadean Corwin, RN Outcome: Adequate for Discharge 03/29/2020 0930 by Nadean Corwin, RN Outcome: Progressing   Problem: Health Behavior/Discharge Planning: Goal: Ability to manage health-related needs will improve 03/29/2020 0930 by Nadean Corwin, RN Outcome: Adequate for Discharge 03/29/2020 0930 by Nadean Corwin, RN Outcome: Progressing   Problem: Self-Care: Goal: Ability to participate in  self-care as condition permits will improve 03/29/2020 0930 by Nadean Corwin, RN Outcome: Adequate for Discharge 03/29/2020 0930 by Nadean Corwin, RN Outcome: Progressing Goal: Verbalization of feelings and concerns over difficulty with self-care will improve 03/29/2020 0930 by Nadean Corwin, RN Outcome: Adequate for Discharge 03/29/2020 0930 by Nadean Corwin, RN Outcome: Progressing Goal: Ability to communicate needs accurately will improve 03/29/2020 0930 by Nadean Corwin, RN Outcome: Adequate for Discharge 03/29/2020 0930 by Nadean Corwin, RN Outcome: Progressing   Problem: Nutrition: Goal: Risk of aspiration will decrease 03/29/2020 0930 by Nadean Corwin, RN Outcome: Adequate for Discharge 03/29/2020 0930 by Nadean Corwin, RN Outcome: Progressing Goal: Dietary intake will improve 03/29/2020 0930 by Nadean Corwin, RN Outcome: Adequate for Discharge 03/29/2020 0930 by Nadean Corwin, RN Outcome: Progressing   Problem: Ischemic Stroke/TIA Tissue Perfusion: Goal: Complications of ischemic stroke/TIA will be minimized 03/29/2020 0930 by Nadean Corwin, RN Outcome: Adequate for Discharge 03/29/2020 0930 by Nadean Corwin, RN Outcome: Progressing   Problem: Education: Goal: Knowledge of disease or condition will improve 03/29/2020 0930 by Nadean Corwin, RN Outcome: Adequate for Discharge 03/29/2020 0930 by Nadean Corwin, RN Outcome: Progressing Goal: Knowledge of secondary prevention will improve 03/29/2020 0930 by Nadean Corwin, RN Outcome: Adequate for Discharge 03/29/2020 0930 by Nadean Corwin, RN Outcome: Progressing Goal: Knowledge of patient specific risk factors addressed and post discharge goals established will improve 03/29/2020 0930 by Nadean Corwin, RN Outcome: Adequate for Discharge 03/29/2020 0930 by Nadean Corwin, RN Outcome: Progressing   Problem: Coping: Goal: Will verbalize positive feelings about self 03/29/2020  0930 by Nadean Corwin, RN Outcome: Adequate for Discharge 03/29/2020 0930 by Nadean Corwin, RN Outcome: Progressing Goal: Will identify appropriate support needs 03/29/2020 0930 by Nadean Corwin, RN Outcome: Adequate for Discharge 03/29/2020 0930 by Nadean Corwin, RN Outcome: Progressing   Problem: Health Behavior/Discharge Planning: Goal: Ability to manage health-related needs will improve 03/29/2020 0930 by Nadean Corwin, RN Outcome: Adequate for Discharge 03/29/2020 0930 by Nadean Corwin,  RN Outcome: Progressing   Problem: Self-Care: Goal: Ability to participate in self-care as condition permits will improve 03/29/2020 0930 by Nadean Corwin, RN Outcome: Adequate for Discharge 03/29/2020 0930 by Nadean Corwin, RN Outcome: Progressing   Problem: Nutrition: Goal: Risk of aspiration will decrease 03/29/2020 0930 by Nadean Corwin, RN Outcome: Adequate for Discharge 03/29/2020 0930 by Nadean Corwin, RN Outcome: Progressing Goal: Dietary intake will improve 03/29/2020 0930 by Nadean Corwin, RN Outcome: Adequate for Discharge 03/29/2020 0930 by Nadean Corwin, RN Outcome: Progressing   Problem: Ischemic Stroke/TIA Tissue Perfusion: Goal: Complications of ischemic stroke/TIA will be minimized 03/29/2020 0930 by Nadean Corwin, RN Outcome: Adequate for Discharge 03/29/2020 0930 by Nadean Corwin, RN Outcome: Progressing   Problem: Education: Goal: Knowledge of disease or condition will improve 03/29/2020 0930 by Nadean Corwin, RN Outcome: Adequate for Discharge 03/29/2020 0930 by Nadean Corwin, RN Outcome: Progressing Goal: Knowledge of secondary prevention will improve 03/29/2020 0930 by Nadean Corwin, RN Outcome: Adequate for Discharge 03/29/2020 0930 by Nadean Corwin, RN Outcome: Progressing Goal: Knowledge of patient specific risk factors addressed and post discharge goals established will improve 03/29/2020 0930 by Nadean Corwin, RN Outcome: Adequate for Discharge 03/29/2020 0930 by Nadean Corwin, RN Outcome: Progressing Goal: Individualized Educational Video(s) 03/29/2020 0930 by Nadean Corwin, RN Outcome: Adequate for Discharge 03/29/2020 0930 by Nadean Corwin, RN Outcome: Progressing   Problem: Nutrition: Goal: Dietary intake will improve 03/29/2020 0930 by Nadean Corwin, RN Outcome: Adequate for Discharge 03/29/2020 0930 by Nadean Corwin, RN Outcome: Progressing

## 2020-03-29 NOTE — Progress Notes (Signed)
Attempted to discuss D/C instructions with sister but she was questioning the diagnosis of CHF on the AVS. She wanted a list of diagnosis for the [place] clinic. This nurse explained it could be obtained from medical records personally or the [place] could request it. Sister was not satisfied with answer. Sister spoke with Gavin Pound, CM and she stated she would fax it over to the [place]. Sister was reluctantly satisfied but when this nurse attempted to hand D/C packet to her and point out medical records phone numbers written on front of packet, she grabbed it and walked away, ignoring this nurse.

## 2020-03-31 ENCOUNTER — Telehealth: Payer: Self-pay

## 2020-03-31 NOTE — Telephone Encounter (Signed)
Transition Care Management Follow-up Telephone Call  Date of discharge and from where: 03/29/2020 from [facility]  How have you been since you were released from the hospital? feeling alright  Any questions or concerns? No  Items Reviewed:  Did the pt receive and understand the discharge instructions provided? Yes   Medications obtained and verified? Yes   Other? No   Any new allergies since your discharge? No   Dietary orders reviewed? Yes (low sodium heart healthy diet)  Do you have support at home? Yes (son)  Home Care and Equipment/Supplies: Were home health services ordered? no If so, what is the name of the agency? n/a  Has the agency set up a time to come to the patient's home? not applicable Were any new equipment or medical supplies ordered?  Yes: wheelchair from [facility] What is the name of the medical supply agency? [employer] Were you able to get the supplies/equipment? yes Do you have any questions related to the use of the equipment or supplies? No  Functional Questionnaire: (I = Independent and D = Dependent) ADLs: Yes  Bathing/Dressing- D  Meal Prep- D  Eating- D  Maintaining continence- D  Transferring/Ambulation- D  Managing Meds- D  Follow up appointments reviewed:   PCP Hospital f/u appt confirmed? Yes  Scheduled to see Dr. Oliver Barre on 04/06/2020 @ 2:40 pm.  Specialist Hospital f/u appt confirmed? Yes  Scheduled to see [facility] for rehabilitation; awaiting appointment.  Are transportation arrangements needed? No   If their condition worsens, is the pt aware to call PCP or go to the Emergency Dept.? Yes  Was the patient provided with contact information for the PCP's office or ED? Yes  Was to pt encouraged to call back with questions or concerns? Yes

## 2020-04-01 NOTE — Progress Notes (Deleted)
Cardiology Office Note Date:  04/01/2020  Patient ID:  Trevor Anderson, Trevor Anderson July 12, 1955, MRN [id number] PCP:  [name], MD  Cardiologist:  Dr. Mayford Knife (2016) Electrophysiologist: Dr. Johney Frame (new last admission/loop implant)  ***refresh   Chief Complaint: *** s/p loop implant, wound check  History of Present Illness: Trevor Anderson is a 64 y.o. male with history of CAD (95% ramus IM at time of Cath in 2009), NICM w/recovered LVEF, DM, HTN, HLD, obesity, medical noncompliance is mentioned, chronic CHF (combined), stroke  He was admitted on 03/11/2020 with with 2 week hx of worsening SOB, DOE, symptoms of orthopnea, w/u in the ER for CHF vs PE, initially started on heparin gtt >> VQ negative and stopped. Also found with hypertensive emergency 213/151, pt mentioned intermittent noncompliance with his medicines. AKI though suspected perhaps some degree of CKD with poor out patient follow up/labs, unclear. 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 [name] saw the patient in consult, lengthy discussion regarding stroke, loop, AFib surveillance and his hx of noncompliance, and importance of taking his medicines, if AF found, importance of compliance with medicines and follow up if /a/c is needed. His son was very supportive, the patient understood, and agreeable. He underwent Loop implant 03/23/20 with Dr. Johney Frame.  *** [facility] *** home rehab? *** symptoms *** AF? *** site *** transmitter? 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 CONGESTIVE  HEART FAILURE 11/12/2007  . DIABETES MELLITUS, TYPE II 11/20/2008  . HYPERLIPIDEMIA 11/12/2007  . HYPERTENSION 11/12/2007  . TACHYCARDIA 01/24/2010    Past Surgical History:  Procedure Laterality Date  . BUBBLE STUDY  03/16/2020   Procedure: BUBBLE STUDY;  Surgeon: Jodelle Red, MD;  Location: [facility] ENDOSCOPY;  Service: Cardiovascular;;  . LOOP RECORDER INSERTION N/A 03/23/2020   Procedure: LOOP RECORDER INSERTION;  Surgeon: Hillis Range, MD;  Location: [facility] INVASIVE CV LAB;  Service: Cardiovascular;  Laterality: N/A;  . TEE WITHOUT CARDIOVERSION N/A 03/16/2020   Procedure: TRANSESOPHAGEAL ECHOCARDIOGRAM (TEE);  Surgeon: Jodelle Red, MD;  Location: [facility] ENDOSCOPY;  Service: Cardiovascular;  Laterality: N/A;  . TONSILLECTOMY  1970    Current Outpatient Medications  Medication Sig Dispense Refill  . amLODipine (NORVASC) 10 MG tablet Take 1 tablet (10 mg total) by mouth daily. 90 tablet 0  . aspirin 81 MG tablet Take 1 tablet (81 mg total) by mouth daily for 21 days. 21 tablet 0  . atorvastatin (LIPITOR) 80 MG tablet Take 1 tablet (80 mg total) by mouth daily. 90 tablet 0  . carvedilol (COREG) 12.5 MG tablet Take 3 tablets (37.5 mg total) by mouth 2 (two) times daily with a meal. 540 tablet 0  . clopidogrel (PLAVIX) 75 MG tablet Take 1 tablet (75 mg total) by mouth daily. 90 tablet 0  . furosemide (LASIX) 40 MG tablet Take 1 tablet (40 mg total) by mouth daily. 90 tablet 0  . Lancets MISC 1 application by Does not apply route daily. 100 each 11  . potassium chloride SA (KLOR-CON M20) 20 MEQ tablet Take 1 tablet (20 mEq total) by mouth daily. 90 tablet 0  .  QUEtiapine (SEROQUEL) 25 MG tablet Take 0.5 tablets (12.5 mg total) by mouth at bedtime. 45 tablet 0  . senna (SENOKOT) 8.6 MG TABS tablet Take 2 tablets (17.2 mg total) by mouth at bedtime for 14 days. 28 tablet 0   No current facility-administered medications for this visit.    Allergies:   Patient has no known allergies.    Social History:  The patient  reports that he has never smoked. He has never used smokeless tobacco. He reports current alcohol use. He reports that he does not use drugs.   Family History:  The patient's family history includes Kidney disease in his mother.***  ROS:  Please see the history of present illness.    All other systems are reviewed and otherwise negative.   PHYSICAL EXAM:  VS:  There were no vitals taken for this visit. BMI: There is no height or weight on file to calculate BMI. Well nourished, well developed, in no acute distress HEENT: normocephalic, atraumatic Neck: no JVD, carotid bruits or masses Cardiac:  *** RRR; no significant murmurs, no rubs, or gallops Lungs:  *** CTA b/l, no wheezing, rhonchi or rales Abd: soft, nontender MS: no deformity or *** atrophy Ext: *** no edema Skin: warm and dry, no rash Neuro:  No gross deficits appreciated Psych: euthymic mood, full affect  *** PPM/ICD site is stable, no tethering or discomfort   EKG:  Done today and reviewed by myself shows  ***  Device interrogation done today and reviewed by myself:  ***   TEE that noted  IMPRESSIONS  1. Left ventricular ejection fraction, by estimation, is 45 to 50%. The  left ventricle has mildly decreased function. The left ventricle  demonstrates global hypokinesis.  2. Right ventricular systolic function is normal. The right ventricular  size is normal.  3. No left atrial/left atrial appendage thrombus was detected.  4. The mitral valve is normal in structure. Mild to moderate mitral valve  regurgitation. No evidence of mitral stenosis.  5. The aortic valve is tricuspid. Aortic valve regurgitation is trivial.  No aortic stenosis is present.  6. There is Moderate (Grade III) plaque involving the descending aorta.  7. Evidence of atrial level shunting detected by color flow Doppler.  Agitated saline contrast bubble study was negative, with no evidence of  any  interatrial shunt. There is a small patent foramen ovale with  predominantly left to right shunting across  the atrial septum.  Conclusion(s)/Recommendation(s): Very small left to right intra-atrial  flow seen by color Doppler. However, negative bubble study. This suggests  small ASD/PFO without right to left shunting.    03/12/2020: TTE IMPRESSIONS  1. Left ventricular ejection fraction, by estimation, is 45 to 50%. The  left ventricle has mildly decreased function. The left ventricle  demonstrates global hypokinesis. There is mild concentric left ventricular  hypertrophy. Left ventricular diastolic  parameters are consistent with Grade I diastolic dysfunction (impaired  relaxation).  2. Right ventricular systolic function is normal. The right ventricular  size is normal. Tricuspid regurgitation signal is inadequate for assessing  PA pressure.  3. The mitral valve is grossly normal. Trivial mitral valve  regurgitation. No evidence of mitral stenosis.  4. The aortic valve is tricuspid. Aortic valve regurgitation is not  visualized. No aortic stenosis is present.  5. The inferior vena cava is normal in size with greater than 50%  respiratory variability, suggesting right atrial pressure of 3 mmHg.    09/15/2014 LVEF 55-60%  03/19/2020: LE venous  US Summary: RIGHT: - There is no evidence of deep vein thrombosis in the lower extremity. However, portions of this examination were limited- see technologist comments above.  - No cystic structure found in the popliteal fossa.  LEFT: - There is no evidence of deep vein thrombosis in the lower extremity.  - No cystic structure found in the popliteal fossa.  *See table(s) above for measurements and observations   Recent Labs: 03/11/2020: B Natriuretic Peptide 705.4 03/17/2020: ALT 26 03/18/2020: Hemoglobin 13.8; Platelets 275 03/19/2020: Magnesium 2.2 03/28/2020: BUN 22; Creatinine, Ser 1.56; Potassium 3.8; Sodium 137  03/12/2020:  Cholesterol 232; HDL 46; LDL Cholesterol 173; Total CHOL/HDL Ratio 5.0; Triglycerides 67; VLDL 13   Estimated Creatinine Clearance: 61.3 mL/min (A) (by C-G formula based on SCr of 1.56 mg/dL (H)).   Wt Readings from Last 3 Encounters:  03/29/20 237 lb 7 oz (107.7 kg)  05/04/17 258 lb (117 kg)  11/02/16 260 lb (117.9 kg)     Other studies reviewed: Additional studies/records reviewed today include: summarized above  ASSESSMENT AND PLAN:  1. ILR, cryptogenic stroke     *** site     ***  2. NICM    ***  3. CAD     ***  4. HTN     ***  Disposition: F/u with ***  Current medicines are reviewed at length with the patient today.  The patient did not have any concerns regarding medicines.  Norma Fredrickson, PA-C 04/01/2020 6:19 AM     [facility] [address] [place] [address] [phone] (office)  [phone] (fax)

## 2020-04-02 ENCOUNTER — Encounter: Payer: Self-pay | Admitting: Physician Assistant

## 2020-04-02 DIAGNOSIS — I69359 Hemiplegia and hemiparesis following cerebral infarction affecting unspecified side: Secondary | ICD-10-CM | POA: Insufficient documentation

## 2020-04-06 ENCOUNTER — Inpatient Hospital Stay: Payer: Self-pay | Admitting: Internal Medicine

## 2020-04-20 ENCOUNTER — Encounter: Payer: Self-pay | Admitting: Physician Assistant

## 2020-04-28 ENCOUNTER — Ambulatory Visit: Payer: Self-pay | Admitting: Internal Medicine

## 2020-04-29 ENCOUNTER — Telehealth: Payer: Self-pay

## 2020-04-29 NOTE — Telephone Encounter (Signed)
Shanda Bumps from [facility] asked for the patient DOB and I told her. She states the patient do not recall having a loop. She also states she do not see any scar on the patient abdomin. I let her know the patient was not implanted on the abdomin he was implanted on his left chest. The patient should have a home remote monitor that needs to be plugged in next to his bedside. She states she is going to ask the patient son about it.

## 2020-04-29 NOTE — Telephone Encounter (Signed)
Patient did not attend wound check apt. Post hospital discharge. Patient is currently at [facility] of [facility] [phone]. Spoke to patients son Trevor Anderson, states the patient went home for 10 days then went to rehab. States his monitor was left at home. Requested to take the remote monitor box to the rehab facility and plug in within 4-6 feet of where patient sleeps. Spoke to [place], LPN at facility, requested she check the patients ILR site implant for any swelling, drainage, redness or other s/s if infection. States she will call back after the patient is out of rehab later today. Direct phone number provided. Also informed her about remote monitor box.

## 2020-05-11 NOTE — Telephone Encounter (Signed)
Spoke to Sue Lush states he will go assess the site and call back. Phone number provided.

## 2020-05-19 NOTE — Telephone Encounter (Signed)
Transmission received 05/19/2020 

## 2020-06-03 ENCOUNTER — Ambulatory Visit (INDEPENDENT_AMBULATORY_CARE_PROVIDER_SITE_OTHER): Payer: No Typology Code available for payment source

## 2020-06-03 DIAGNOSIS — I639 Cerebral infarction, unspecified: Secondary | ICD-10-CM

## 2020-06-03 LAB — CUP PACEART REMOTE DEVICE CHECK
Date Time Interrogation Session: 20220209174533
Implantable Pulse Generator Implant Date: 20211130

## 2020-06-09 NOTE — Progress Notes (Signed)
Carelink Summary Report / Loop Recorder 

## 2020-07-05 ENCOUNTER — Ambulatory Visit (INDEPENDENT_AMBULATORY_CARE_PROVIDER_SITE_OTHER): Payer: No Typology Code available for payment source

## 2020-07-05 DIAGNOSIS — I639 Cerebral infarction, unspecified: Secondary | ICD-10-CM

## 2020-07-06 LAB — CUP PACEART REMOTE DEVICE CHECK
Date Time Interrogation Session: 20220314174200
Implantable Pulse Generator Implant Date: 20211130

## 2020-07-13 NOTE — Progress Notes (Signed)
Carelink Summary Report / Loop Recorder 

## 2020-08-05 ENCOUNTER — Ambulatory Visit (INDEPENDENT_AMBULATORY_CARE_PROVIDER_SITE_OTHER): Payer: No Typology Code available for payment source

## 2020-08-05 DIAGNOSIS — I639 Cerebral infarction, unspecified: Secondary | ICD-10-CM | POA: Diagnosis not present

## 2020-08-10 LAB — CUP PACEART REMOTE DEVICE CHECK
Date Time Interrogation Session: 20220416174711
Implantable Pulse Generator Implant Date: 20211130

## 2020-08-23 NOTE — Progress Notes (Signed)
Carelink Summary Report / Loop Recorder 

## 2020-09-06 ENCOUNTER — Ambulatory Visit (INDEPENDENT_AMBULATORY_CARE_PROVIDER_SITE_OTHER): Payer: No Typology Code available for payment source

## 2020-09-06 DIAGNOSIS — I639 Cerebral infarction, unspecified: Secondary | ICD-10-CM

## 2020-09-14 LAB — CUP PACEART REMOTE DEVICE CHECK
Date Time Interrogation Session: 20220519174224
Implantable Pulse Generator Implant Date: 20211130

## 2020-09-29 NOTE — Progress Notes (Signed)
Carelink Summary Report / Loop Recorder 

## 2020-10-13 ENCOUNTER — Ambulatory Visit (INDEPENDENT_AMBULATORY_CARE_PROVIDER_SITE_OTHER): Payer: No Typology Code available for payment source

## 2020-10-13 DIAGNOSIS — I639 Cerebral infarction, unspecified: Secondary | ICD-10-CM | POA: Diagnosis not present

## 2020-10-13 LAB — CUP PACEART REMOTE DEVICE CHECK
Date Time Interrogation Session: 20220621174258
Implantable Pulse Generator Implant Date: 20211130

## 2020-11-02 NOTE — Progress Notes (Signed)
Carelink Summary Report / Loop Recorder 

## 2020-11-12 ENCOUNTER — Ambulatory Visit: Payer: No Typology Code available for payment source | Attending: Adult Health | Admitting: Physical Therapy

## 2020-11-12 ENCOUNTER — Encounter: Payer: Self-pay | Admitting: Physical Therapy

## 2020-11-12 ENCOUNTER — Other Ambulatory Visit: Payer: Self-pay

## 2020-11-12 DIAGNOSIS — R293 Abnormal posture: Secondary | ICD-10-CM | POA: Diagnosis not present

## 2020-11-12 DIAGNOSIS — I69351 Hemiplegia and hemiparesis following cerebral infarction affecting right dominant side: Secondary | ICD-10-CM | POA: Insufficient documentation

## 2020-11-12 DIAGNOSIS — M6281 Muscle weakness (generalized): Secondary | ICD-10-CM | POA: Insufficient documentation

## 2020-11-12 DIAGNOSIS — R2681 Unsteadiness on feet: Secondary | ICD-10-CM | POA: Insufficient documentation

## 2020-11-12 NOTE — Therapy (Signed)
[facility] [address] [place], [place], [address] Phone: [phone]   Fax:  [phone]  Physical Therapy Evaluation  Patient Details  Name: Trevor Anderson MRN: [id number] Date of Birth: 12/19/1955 Referring Provider (PT): Noel Gerold   Encounter Date: 11/12/2020   PT End of Session - 11/12/20 1041     Visit Number 1    Number of Visits 16    Date for PT Re-Evaluation 01/07/21    Authorization Type [facility] auth# [id number] (15 visits)    Authorization - Visit Number 0    Authorization - Number of Visits 15    PT Start Time 1045    PT Stop Time 1130    PT Time Calculation (min) 45 min    Activity Tolerance Patient tolerated treatment well    Behavior During Therapy [facility] for tasks assessed/performed             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   CONGESTIVE HEART FAILURE 11/12/2007   DIABETES MELLITUS, TYPE II 11/20/2008   HYPERLIPIDEMIA 11/12/2007   HYPERTENSION 11/12/2007   TACHYCARDIA 01/24/2010    Past Surgical History:  Procedure Laterality Date   BUBBLE STUDY  03/16/2020   Procedure: BUBBLE STUDY;  Surgeon: Jodelle Red, MD;  Location: [facility] ENDOSCOPY;  Service: Cardiovascular;;   LOOP RECORDER INSERTION N/A 03/23/2020   Procedure: LOOP RECORDER INSERTION;  Surgeon: Hillis Range, MD;  Location: [facility] INVASIVE CV LAB;  Service: Cardiovascular;  Laterality: N/A;   TEE WITHOUT CARDIOVERSION N/A 03/16/2020   Procedure: TRANSESOPHAGEAL ECHOCARDIOGRAM (TEE);  Surgeon: Jodelle Red, MD;  Location: [facility] ENDOSCOPY;  Service: Cardiovascular;  Laterality: N/A;   TONSILLECTOMY  1970    There were no vitals filed for this visit.    Subjective Assessment - 11/12/20 1050     Subjective Pt reports R side is weak and numb. Pt reports he had the stroke Nov 2021. Pt states he went to rehab hospital, went to rehab hospital, returned home in April and got HHPT.  He was recently d/c-ed from HHPT ~2 weeks ago. Pt states he has been standing a little bit 5-10 minutes. Pt requires assistance for sitting without support. Pt has been pedaling on the floor and got a resistance band. Pt is to also get OT OPPT. Prior to CVA he was independent and able to do everything. Pt is requiring assistance for transfers and all ADLs.    Limitations Standing;Sitting    How long can you stand comfortably? 5-10 minutes    Patient Stated Goals Open/close hand; Get more R LE motion;                [facility] PT Assessment - 11/12/20 0001       Assessment   Medical Diagnosis CVA affecting R side    Referring Provider (PT) Noel Gerold    Onset Date/Surgical Date --   November 2021   Hand Dominance Right    Prior Therapy Went to rehab, recently finished HHPT      Precautions   Precautions None      Restrictions   Weight Bearing Restrictions No      Balance Screen   Has the patient fallen in the past 6 months No      Home Environment   Living Environment Private residence    Living Arrangements Children   Son   Available Help at Discharge Family;Available PRN/intermittently    Type of Home House    Home  Access Ramped entrance    Home Layout One level    Home Equipment Wheelchair - manual;Hospital bed   [employer], transfer board, wrist/ankle weights, resistance bands     Prior Function   Level of Independence Needs assistance with ADLs    Level of Independence Needs assistance with ADLs;Needs assistance with homemaking;Needs assistance with gait;Needs assistance with tranfers    Comments Has aide for 7 hrs/day      Cognition   Overall Cognitive Status Within Functional Limits for tasks assessed      Observation/Other Assessments   Focus on Therapeutic Outcomes (FOTO)  not captured      Sensation   Light Touch Appears Intact    Hot/Cold Impaired by gross assessment   per pt report   Additional Comments Reports intermittent N/T ankle to knee on R       Coordination   Gross Motor Movements are Fluid and Coordinated No    Coordination and Movement Description Unable to move R LE; L LE WFL      Tone   Assessment Location Right Lower Extremity      ROM / Strength   AROM / PROM / Strength Strength      Strength   Overall Strength Comments R LE grossly 0/5 with reports of R hip pain; L LE grossly 5/5      Transfers   Transfers Sit to Stand;Stand to [employer];Lateral/Scoot Transfers    Sit to Stand 2: Max assist;With upper extremity assist;From chair/3-in-1;From bed   +2 for safety   Sit to Stand Details Manual facilitation for weight shifting;Manual facilitation for placement;Manual facilitation for weight bearing;Verbal cues for sequencing;Verbal cues for technique    Sit to Stand Details (indicate cue type and reason) Decreased forward trunk lean    Stand to Sit 3: Mod assist    Stand to Sit Details (indicate cue type and reason) Manual facilitation for weight shifting;Manual facilitation for placement;Manual facilitation for weight bearing    Stand to Sit Details able to control descent with L UE    Stand Pivot Transfers 2: Max assist;With armrests;From elevated surface   +2 for safety   Stand Pivot Transfer Details (indicate cue type and reason) Assist for R LE negotiation and forward trunk lean    Lateral/Scoot Transfers 2: Max assist    Lateral/Scoot Transfer Details (indicate cue type and reason) Difficulty performing full weight shifts L and R to clear hip off bed      Ambulation/Gait   Ambulation/Gait --   Not safe to assist     Balance   Balance Assessed Yes      Static Sitting Balance   Static Sitting - Balance Support Left upper extremity supported    Static Sitting - Level of Assistance 5: Stand by assistance    Static Sitting - Comment/# of Minutes 2      Dynamic Sitting Balance   Dynamic Sitting - Balance Support Left upper extremity supported    Dynamic Sitting - Level of Assistance 5: Stand by  assistance;4: Min assist    Dynamic Sitting Balance - Compensations L lateral lean to compensate for R LOBs    Reach (Patient is able to reach ___ inches to right, left, forward, back) 5    Dynamic Sitting - Balance Activities --   See FIST   Dynamic Sitting balance - Comments Function in Sitting Test Score 33/56: anterior, posterior and lateral nudges 4, static sitting 4; sitting shake "no" 4; sitting eyes closed 4  with increased L lateral lean; sitting lift foot 3 with L lateral lean; pick up object from behind 1 mod/max A; forward reach 2; lateral reach 1; pick up object from floor 0; posterior scooting 1 needs max A; anterior scooting 2; lateral scooting 0      RLE Tone   RLE Tone Modified Ashworth;Flaccid      RLE Tone   Modified Ashworth Scale for Grading Hypertonia RLE Slight increase in muscle tone, manifested by a catch, followed by minimal resistance throughout the remainder (less than half) of the ROM   20 beat myoclonus with DF                       Objective measurements completed on examination: See above findings.                    PT Long Term Goals - 11/12/20 1303       PT LONG TERM GOAL #1   Title Pt will be able to perform HEP with caregiver assist    Time 8    Period Weeks    Status New    Target Date 01/07/21      PT LONG TERM GOAL #2   Title Pt will be able to perform all edge of bed scooting with CGA    Baseline Currently requiring min/mod A    Time 8    Period Weeks    Status New    Target Date 01/07/21      PT LONG TERM GOAL #3   Title Pt will be able to maintain standing with min A for at least 5 minutes    Baseline Currently requiring mod A for ~30 sec    Time 8    Period Weeks    Status New    Target Date 01/07/21      PT LONG TERM GOAL #4   Title Pt will be able to perform chair transfer with min A    Baseline Stand pivot t/fs requiring max A for safety    Time 8    Period Weeks    Status New    Target Date  01/07/21      PT LONG TERM GOAL #5   Title FIST score will improve to at least 38/56 to demo MDC for improved sitting balance with decreased risk of falls    Baseline 33/56    Time 8    Period Weeks    Status New    Target Date 01/07/21                    Plan - 11/12/20 1254     Clinical Impression Statement Mr. Damond Borchers is a 65 y/o M presenting to OPPT s/p CVA in November of 2021. Pt with recent return home in Apr 2022 and had been receiving HHPT until a few weeks ago. On assessment, pt demos mild R LE spasticity/myoclonus and flaccid R LE leading to decreased ROM, balance, and activity tolerance with t/fs and ADLs. Pt with decreased sitting balance and only able to stand in front of sink by pulling himself into standing. Prior to CVA, pt was completely independent. Pt would benefit from PT to optimize his level of function for safe home transfers and standing. Pt is also waiting OT referral to work on his R hand.    Personal Factors and Comorbidities Age;Time since onset of injury/illness/exacerbation    Examination-Activity Limitations Locomotion Level;Transfers;Sit;Stand;Toileting;Dressing;Hygiene/Grooming;Self Feeding;Bed  Mobility;Bend;Bathing    Examination-Participation Restrictions Community Activity;Shop;Meal Prep;Yard Work    Conservation officer, historic buildings Evolving/Moderate complexity    Rehab Potential Fair    PT Frequency 2x / week    PT Duration 8 weeks    PT Treatment/Interventions ADLs/Self Care Home Management;Electrical Stimulation;DME Instruction;Gait training;Functional mobility training;Therapeutic activities;Therapeutic exercise;Balance training;Neuromuscular re-education;Manual techniques;Patient/family education;Orthotic Fit/Training;Wheelchair mobility training;Passive range of motion;Dry needling;Taping    PT Next Visit Plan Assess sup<>sit and bed mobility. Attempt AAROM for R LE; provide stretches as needed. Work on standing -- consider standing  frame. Work on transfers and sitting balance (especially forward lean, L<>R weight shift for improved scooting).    Recommended Other Services Will benefit from OT evaluation    Consulted and Agree with Plan of Care Patient;Family member/caregiver    Family Member Consulted Son             Patient will benefit from skilled therapeutic intervention in order to improve the following deficits and impairments:  Abnormal gait, Difficulty walking, Impaired tone, Decreased range of motion, Decreased coordination, Decreased endurance, Impaired UE functional use, Decreased activity tolerance, Pain, Decreased balance, Decreased mobility, Decreased strength, Impaired sensation, Postural dysfunction  Visit Diagnosis: Abnormal posture  Muscle weakness (generalized)  Unsteadiness on feet  Hemiplegia and hemiparesis following cerebral infarction affecting right dominant side [facility])     Problem List Patient Active Problem List   Diagnosis Date Noted   Cerebrovascular accident (CVA) (HCC)    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   Hypertensive urgency 03/12/2020   Acute on chronic systolic CHF (congestive heart failure) (HCC) 03/12/2020   AKI (acute kidney injury) (HCC) 03/12/2020   Fall at home, initial encounter 03/12/2020   Right hip pain 03/12/2020   Hypokalemia 03/12/2020   Coronary artery disease involving native coronary artery of native heart without angina pectoris 09/07/2014   Renal insufficiency 12/02/2012   Preventative health care 11/13/2010   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  [address] [place], [place], [address] Phone: [phone]   Fax:  [phone]  Name: MOIR BANEY MRN: [id number] Date of Birth: 12/31/1955

## 2020-11-15 ENCOUNTER — Ambulatory Visit (INDEPENDENT_AMBULATORY_CARE_PROVIDER_SITE_OTHER): Payer: No Typology Code available for payment source

## 2020-11-15 DIAGNOSIS — I639 Cerebral infarction, unspecified: Secondary | ICD-10-CM | POA: Diagnosis not present

## 2020-11-16 LAB — CUP PACEART REMOTE DEVICE CHECK
Date Time Interrogation Session: 20220724174521
Implantable Pulse Generator Implant Date: 20211130

## 2020-11-22 ENCOUNTER — Other Ambulatory Visit: Payer: Self-pay

## 2020-11-22 ENCOUNTER — Ambulatory Visit: Payer: No Typology Code available for payment source | Attending: Adult Health | Admitting: Physical Therapy

## 2020-11-22 DIAGNOSIS — I69351 Hemiplegia and hemiparesis following cerebral infarction affecting right dominant side: Secondary | ICD-10-CM | POA: Insufficient documentation

## 2020-11-22 DIAGNOSIS — M6281 Muscle weakness (generalized): Secondary | ICD-10-CM | POA: Diagnosis present

## 2020-11-22 DIAGNOSIS — R2681 Unsteadiness on feet: Secondary | ICD-10-CM | POA: Diagnosis present

## 2020-11-22 DIAGNOSIS — R293 Abnormal posture: Secondary | ICD-10-CM | POA: Insufficient documentation

## 2020-11-22 NOTE — Therapy (Signed)
[facility] [address] [place], [place], [address] Phone: [phone]   Fax:  [phone]  Physical Therapy Treatment  Patient Details  Name: Trevor Anderson MRN: [id number] Date of Birth: 1956-01-13 Referring Provider (PT): Noel Gerold   Encounter Date: 11/22/2020   PT End of Session - 11/22/20 1425     Visit Number 2    Number of Visits 16    Date for PT Re-Evaluation 01/07/21    Authorization Type VA auth# [id number] (15 visits)    Authorization - Visit Number 1    Authorization - Number of Visits 15    PT Start Time 1430    PT Stop Time 1515    PT Time Calculation (min) 45 min    Equipment Utilized During Treatment Gait belt    Activity Tolerance Patient tolerated treatment well    Behavior During Therapy [facility] for tasks assessed/performed             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   CONGESTIVE HEART FAILURE 11/12/2007   DIABETES MELLITUS, TYPE II 11/20/2008   HYPERLIPIDEMIA 11/12/2007   HYPERTENSION 11/12/2007   TACHYCARDIA 01/24/2010    Past Surgical History:  Procedure Laterality Date   BUBBLE STUDY  03/16/2020   Procedure: BUBBLE STUDY;  Surgeon: Jodelle Red, MD;  Location: [facility] ENDOSCOPY;  Service: Cardiovascular;;   LOOP RECORDER INSERTION N/A 03/23/2020   Procedure: LOOP RECORDER INSERTION;  Surgeon: Hillis Range, MD;  Location: [facility] INVASIVE CV LAB;  Service: Cardiovascular;  Laterality: N/A;   TEE WITHOUT CARDIOVERSION N/A 03/16/2020   Procedure: TRANSESOPHAGEAL ECHOCARDIOGRAM (TEE);  Surgeon: Jodelle Red, MD;  Location: [facility] ENDOSCOPY;  Service: Cardiovascular;  Laterality: N/A;   TONSILLECTOMY  1970    There were no vitals filed for this visit.   Subjective Assessment - 11/22/20 1433     Subjective Pt reports no new issues or falls. Reports R hip pain since about noon. Pt reports he's been sitting in the w/c since noon.     Limitations Standing;Sitting    How long can you stand comfortably? 5-10 minutes    Patient Stated Goals Open/close hand; Get more R LE motion;    Currently in Pain? Yes    Pain Score 3     Pain Location Hip    Pain Orientation Right              OBJECTIVE:  Therapeutic activity:  Sit<>stand x 5 mod A; verbal and tactile cues for weight shift and foot positioning, pt pulling up on standing frame  Stand pivot t/f x2 mod A  Scooting EOB x10 mod A; v/cs and t/cs for R<>L weight shift to achieve scoot  Sit-> sup mod A to negotiate bilat LEs to clear EOB  Rolling L<>R x 5; unable to achieve full roll on L due to pt c/o R hip pain  Sup-> sit max A +2 due to pt's R hip pain when attempting roll  Therex:  In standing frame:    Standing 3x30 sec with intermittent/no UE support   Standing weight shift L<>R x10  Seated EOB on dynadisk:   L<>R weight shift x10 with return to center   Forward/backward weight shift x10 with return to center   Reaching to L and then R x10 each   Supine:   R LE AAROM hip flex/ext x10 -- pt able to push into hip & knee extension, minimal hip flexion; flexor spasticity noted initially  R hip add and abd x10 each               PT Long Term Goals - 11/12/20 1303       PT LONG TERM GOAL #1   Title Pt will be able to perform HEP with caregiver assist    Time 8    Period Weeks    Status New    Target Date 01/07/21      PT LONG TERM GOAL #2   Title Pt will be able to perform all edge of bed scooting with CGA    Baseline Currently requiring min/mod A    Time 8    Period Weeks    Status New    Target Date 01/07/21      PT LONG TERM GOAL #3   Title Pt will be able to maintain standing with min A for at least 5 minutes    Baseline Currently requiring mod A for ~30 sec    Time 8    Period Weeks    Status New    Target Date 01/07/21      PT LONG TERM GOAL #4   Title Pt will be able to perform chair transfer with min A    Baseline Stand  pivot t/fs requiring max A for safety    Time 8    Period Weeks    Status New    Target Date 01/07/21      PT LONG TERM GOAL #5   Title FIST score will improve to at least 38/56 to demo MDC for improved sitting balance with decreased risk of falls    Baseline 33/56    Time 8    Period Weeks    Status New    Target Date 01/07/21                   Plan - 11/22/20 1518     Clinical Impression Statement Treatment focused on increasing standing tolerance and sitting balance using dynadisk. Pt able to tolerate static standing x30 sec without UE support this session using standing frame. Rolling to L limited due to pt complaint of R hip/groin pain during attempt.    Personal Factors and Comorbidities Age;Time since onset of injury/illness/exacerbation    Examination-Activity Limitations Locomotion Level;Transfers;Sit;Stand;Toileting;Dressing;Hygiene/Grooming;Self Feeding;Bed Mobility;Bend;Bathing    Examination-Participation Restrictions Community Activity;Shop;Meal Prep;Yard Work    Conservation officer, historic buildings Evolving/Moderate complexity    Rehab Potential Fair    PT Frequency 2x / week    PT Duration 8 weeks    PT Treatment/Interventions ADLs/Self Care Home Management;Electrical Stimulation;DME Instruction;Gait training;Functional mobility training;Therapeutic activities;Therapeutic exercise;Balance training;Neuromuscular re-education;Manual techniques;Patient/family education;Orthotic Fit/Training;Wheelchair mobility training;Passive range of motion;Dry needling;Taping    PT Next Visit Plan Continue AAROM for R LE; provide stretches as needed. Work on standing tolerance and balance with standing frame. Work on transfers and sitting balance (especially forward lean, L<>R weight shift for improved scooting).    PT Home Exercise Plan Forward lean x20 with caregiver; standing 3x daily for at least 30 sec with caregiver    Consulted and Agree with Plan of Care Patient;Family  member/caregiver    Family Member Consulted Son             Patient will benefit from skilled therapeutic intervention in order to improve the following deficits and impairments:  Abnormal gait, Difficulty walking, Impaired tone, Decreased range of motion, Decreased coordination, Decreased endurance, Impaired UE functional use, Decreased activity tolerance, Pain, Decreased balance, Decreased mobility, Decreased strength,  Impaired sensation, Postural dysfunction  Visit Diagnosis: Abnormal posture  Muscle weakness (generalized)  Unsteadiness on feet  Hemiplegia and hemiparesis following cerebral infarction affecting right dominant side [facility])     Problem List Patient Active Problem List   Diagnosis Date Noted   Cerebrovascular accident (CVA) (HCC)    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   Hypertensive urgency 03/12/2020   Acute on chronic systolic CHF (congestive heart failure) (HCC) 03/12/2020   AKI (acute kidney injury) (HCC) 03/12/2020   Fall at home, initial encounter 03/12/2020   Right hip pain 03/12/2020   Hypokalemia 03/12/2020   Coronary artery disease involving native coronary artery of native heart without angina pectoris 09/07/2014   Renal insufficiency 12/02/2012   Preventative health care 11/13/2010   Secondary cardiomyopathy (HCC) 01/24/2010   TACHYCARDIA 01/24/2010   Type 2 diabetes mellitus without complication, with long-term current use of insulin (HCC) 11/20/2008   Mixed hyperlipidemia 11/12/2007   Essential hypertension 11/12/2007   CONGESTIVE HEART FAILURE 11/12/2007    Donnella Morford April Ma L Taelor Moncada PT, DPT 11/22/2020, 3:22 PM  [facility] [address] [place], [place], [address] Phone: [phone]   Fax:  [phone]  Name: Trevor Anderson MRN: [id number] Date of Birth: October 25, 1955

## 2020-11-24 ENCOUNTER — Ambulatory Visit: Payer: No Typology Code available for payment source | Admitting: Physical Therapy

## 2020-11-26 ENCOUNTER — Ambulatory Visit: Payer: No Typology Code available for payment source | Admitting: Physical Therapy

## 2020-11-29 ENCOUNTER — Ambulatory Visit: Payer: No Typology Code available for payment source | Admitting: Physical Therapy

## 2020-12-01 ENCOUNTER — Ambulatory Visit: Payer: No Typology Code available for payment source | Admitting: Physical Therapy

## 2020-12-03 ENCOUNTER — Ambulatory Visit: Payer: No Typology Code available for payment source | Admitting: Physical Therapy

## 2020-12-08 ENCOUNTER — Other Ambulatory Visit: Payer: Self-pay

## 2020-12-08 ENCOUNTER — Ambulatory Visit: Payer: No Typology Code available for payment source | Admitting: Physical Therapy

## 2020-12-08 DIAGNOSIS — I69351 Hemiplegia and hemiparesis following cerebral infarction affecting right dominant side: Secondary | ICD-10-CM

## 2020-12-08 DIAGNOSIS — M6281 Muscle weakness (generalized): Secondary | ICD-10-CM

## 2020-12-08 DIAGNOSIS — R293 Abnormal posture: Secondary | ICD-10-CM

## 2020-12-08 DIAGNOSIS — R2681 Unsteadiness on feet: Secondary | ICD-10-CM

## 2020-12-09 NOTE — Therapy (Signed)
[facility] [address] [place], [place], [address] Phone: [phone]   Fax:  [phone]  Physical Therapy Treatment  Patient Details  Name: Trevor Anderson MRN: [id number] Date of Birth: 12/01/1955 Referring Provider (PT): Noel Gerold   Encounter Date: 12/08/2020   PT End of Session - 12/08/20 1635     Visit Number 3    Number of Visits 16    Date for PT Re-Evaluation 01/07/21    Authorization Type VA auth# [id number] (15 visits)    Authorization - Visit Number 3   updated on 8/18 to include eval visit   Authorization - Number of Visits 15    PT Start Time 1534    PT Stop Time 1615    PT Time Calculation (min) 41 min    Equipment Utilized During Treatment Gait belt    Activity Tolerance Patient tolerated treatment well    Behavior During Therapy [facility] for tasks assessed/performed             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   CONGESTIVE HEART FAILURE 11/12/2007   DIABETES MELLITUS, TYPE II 11/20/2008   HYPERLIPIDEMIA 11/12/2007   HYPERTENSION 11/12/2007   TACHYCARDIA 01/24/2010    Past Surgical History:  Procedure Laterality Date   BUBBLE STUDY  03/16/2020   Procedure: BUBBLE STUDY;  Surgeon: Jodelle Red, MD;  Location: [facility] ENDOSCOPY;  Service: Cardiovascular;;   LOOP RECORDER INSERTION N/A 03/23/2020   Procedure: LOOP RECORDER INSERTION;  Surgeon: Hillis Range, MD;  Location: [facility] INVASIVE CV LAB;  Service: Cardiovascular;  Laterality: N/A;   TEE WITHOUT CARDIOVERSION N/A 03/16/2020   Procedure: TRANSESOPHAGEAL ECHOCARDIOGRAM (TEE);  Surgeon: Jodelle Red, MD;  Location: [facility] ENDOSCOPY;  Service: Cardiovascular;  Laterality: N/A;   TONSILLECTOMY  1970    There were no vitals filed for this visit.   Subjective Assessment - 12/08/20 1538     Subjective Hip started huring on the way to therapy today. No falls. Reports doing his HEP with no  issues.    Limitations Standing;Sitting    How long can you stand comfortably? 5-10 minutes    Patient Stated Goals Open/close hand; Get more R LE motion;    Currently in Pain? Yes    Pain Score 3     Pain Location Hip    Pain Orientation Right    Pain Descriptors / Indicators Sharp    Pain Onset More than a month ago    Pain Frequency Intermittent    Aggravating Factors  movements    Pain Relieving Factors immobility                   [facility] Adult PT Treatment/Exercise - 12/08/20 1541       Bed Mobility   Bed Mobility Sit to Sidelying Right;Rolling Left;Rolling Right;Right Sidelying to Sit    Rolling Right Minimal Assistance - Patient > 75%    Rolling Left Contact Guard/Touching assist    Right Sidelying to Sit Maximal Assistance - Patient 25-49%    Sit to Sidelying Right Moderate Assistance - Patient 50-74%      Transfers   Transfers Lateral/Scoot Transfers    Lateral/Scoot Transfers 3: Mod assist;4: Min guard    Lateral/Scoot Transfer Details (indicate cue type and reason) total assist to place slide board under pt. mod assist to initiate scooting toward left side, once on board min guard assist needed to complete scooting onto mat table toward left side. directional cues  to task needed with cues for hand and foot placement. then total assist to place board while seated on the mat table max assist to initiate scooting onto the board, then min to mod assist to fully scoot across board with assist for foot placement, cues/assist for hand placement and weight shifting.      Exercises   Exercises Other Exercises    Other Exercises  supine on mat table: passived stretching of right heel cord, hamstrings and hip adductors for ~60 seconds each for 2-3 reps each. Right LE strengthening: hip/knee flexion/extension with max assist for flexion due to tone/spasms, pt able to assist with extension, double leg bridge with assist to stabilize right LE, then right single leg bridge with  assist to stabilize LE with cues for increased pelvic lifting, then worked on right hip falls outs with PTA hands as targets, ended with SLR with assist for knee control and lifiting/lowering. each ex performed for 10-12 reps each.                    PT Long Term Goals - 11/12/20 1303       PT LONG TERM GOAL #1   Title Pt will be able to perform HEP with caregiver assist    Time 8    Period Weeks    Status New    Target Date 01/07/21      PT LONG TERM GOAL #2   Title Pt will be able to perform all edge of bed scooting with CGA    Baseline Currently requiring min/mod A    Time 8    Period Weeks    Status New    Target Date 01/07/21      PT LONG TERM GOAL #3   Title Pt will be able to maintain standing with min A for at least 5 minutes    Baseline Currently requiring mod A for ~30 sec    Time 8    Period Weeks    Status New    Target Date 01/07/21      PT LONG TERM GOAL #4   Title Pt will be able to perform chair transfer with min A    Baseline Stand pivot t/fs requiring max A for safety    Time 8    Period Weeks    Status New    Target Date 01/07/21      PT LONG TERM GOAL #5   Title FIST score will improve to at least 38/56 to demo MDC for improved sitting balance with decreased risk of falls    Baseline 33/56    Time 8    Period Weeks    Status New    Target Date 01/07/21                   Plan - 12/08/20 1635     Clinical Impression Statement Today's skilled session continued to focus on transfer training and right LE stretching/strengthening. As pt has a sliding board at home changed focus to use of sliding board to work on more functional independance with tranfers, weight shifting and sequencing. No issues noted or reported in session. The pt should benefit from continued PT to progress toward unmet goals.    Personal Factors and Comorbidities Age;Time since onset of injury/illness/exacerbation    Examination-Activity Limitations Locomotion  Level;Transfers;Sit;Stand;Toileting;Dressing;Hygiene/Grooming;Self Feeding;Bed Mobility;Bend;Bathing    Examination-Participation Restrictions Community Activity;Shop;Meal Prep;Yard Work    Best boy  PT Frequency 2x / week    PT Duration 8 weeks    PT Treatment/Interventions ADLs/Self Care Home Management;Electrical Stimulation;DME Instruction;Gait training;Functional mobility training;Therapeutic activities;Therapeutic exercise;Balance training;Neuromuscular re-education;Manual techniques;Patient/family education;Orthotic Fit/Training;Wheelchair mobility training;Passive range of motion;Dry needling;Taping    PT Next Visit Plan continue to work on slide board transfers for decreased caregiver burden at home, lateral scooting to work on core/sequencing of movements, standing with Stedy lift assist as able to work on weight bearing/posture, right LE stretching to address tone, strengthening as well    PT Home Exercise Plan Forward lean x20 with caregiver; standing 3x daily for at least 30 sec with caregiver    Consulted and Agree with Plan of Care Patient;Family member/caregiver    Family Member Consulted Son             Patient will benefit from skilled therapeutic intervention in order to improve the following deficits and impairments:  Abnormal gait, Difficulty walking, Impaired tone, Decreased range of motion, Decreased coordination, Decreased endurance, Impaired UE functional use, Decreased activity tolerance, Pain, Decreased balance, Decreased mobility, Decreased strength, Impaired sensation, Postural dysfunction  Visit Diagnosis: Abnormal posture  Muscle weakness (generalized)  Unsteadiness on feet  Hemiplegia and hemiparesis following cerebral infarction affecting right dominant side [facility])     Problem List Patient Active Problem List   Diagnosis Date Noted   Cerebrovascular accident (CVA) (HCC)     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   Hypertensive urgency 03/12/2020   Acute on chronic systolic CHF (congestive heart failure) (HCC) 03/12/2020   AKI (acute kidney injury) (HCC) 03/12/2020   Fall at home, initial encounter 03/12/2020   Right hip pain 03/12/2020   Hypokalemia 03/12/2020   Coronary artery disease involving native coronary artery of native heart without angina pectoris 09/07/2014   Renal insufficiency 12/02/2012   Preventative health care 11/13/2010   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

## 2020-12-10 ENCOUNTER — Other Ambulatory Visit: Payer: Self-pay

## 2020-12-10 ENCOUNTER — Ambulatory Visit: Payer: No Typology Code available for payment source | Admitting: Physical Therapy

## 2020-12-10 ENCOUNTER — Encounter: Payer: Self-pay | Admitting: Physical Therapy

## 2020-12-10 DIAGNOSIS — R293 Abnormal posture: Secondary | ICD-10-CM

## 2020-12-10 DIAGNOSIS — I69351 Hemiplegia and hemiparesis following cerebral infarction affecting right dominant side: Secondary | ICD-10-CM

## 2020-12-10 DIAGNOSIS — M6281 Muscle weakness (generalized): Secondary | ICD-10-CM

## 2020-12-10 DIAGNOSIS — R2681 Unsteadiness on feet: Secondary | ICD-10-CM

## 2020-12-10 NOTE — Therapy (Addendum)
[facility] [address] [place], [place], [address] Phone: [phone]   Fax:  [phone]  Physical Therapy Treatment  Patient Details  Name: Trevor Anderson MRN: [id number] Date of Birth: [birth year] Referring Provider (PT): Noel Gerold   Encounter Date: 12/10/2020   PT End of Session - 12/10/20 1716     Visit Number 4    Number of Visits 16    Date for PT Re-Evaluation 01/07/21    Authorization Type [place] auth# [id number] (15 visits)    Authorization - Visit Number 4   updated on 8/18 to include eval visit   Authorization - Number of Visits 15    PT Start Time 1534    PT Stop Time 1615    PT Time Calculation (min) 41 min    Equipment Utilized During Treatment Gait belt    Activity Tolerance Patient tolerated treatment well    Behavior During Therapy [facility] for tasks assessed/performed             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   CONGESTIVE HEART FAILURE 11/12/2007   DIABETES MELLITUS, TYPE II 11/20/2008   HYPERLIPIDEMIA 11/12/2007   HYPERTENSION 11/12/2007   TACHYCARDIA 01/24/2010    Past Surgical History:  Procedure Laterality Date   BUBBLE STUDY  03/16/2020   Procedure: BUBBLE STUDY;  Surgeon: Jodelle Red, MD;  Location: [facility] ENDOSCOPY;  Service: Cardiovascular;;   LOOP RECORDER INSERTION N/A 03/23/2020   Procedure: LOOP RECORDER INSERTION;  Surgeon: Hillis Range, MD;  [facility] INVASIVE CV LAB;  Service: Cardiovascular;  Laterality: N/A;   TEE WITHOUT CARDIOVERSION N/A 03/16/2020   Procedure: TRANSESOPHAGEAL ECHOCARDIOGRAM (TEE);  Surgeon: Jodelle Red, MD;  Location: [facility] ENDOSCOPY;  Service: Cardiovascular;  Laterality: N/A;   TONSILLECTOMY  1970    There were no vitals filed for this visit.   Subjective Assessment - 12/10/20 2341     Subjective No new complaints. Was tired after last session, no increased pain. No falls since last  session.    Limitations Standing;Sitting    How long can you stand comfortably? 5-10 minutes    Patient Stated Goals Open/close hand; Get more R LE motion;    Currently in Pain? Yes    Pain Score 2     Pain Location Hip    Pain Orientation Right    Pain Descriptors / Indicators Aching;Sore    Pain Type Acute pain;Chronic pain    Pain Onset More than a month ago    Pain Frequency Intermittent    Aggravating Factors  certain movements    Pain Relieving Factors immobility                     [facility] Adult PT Treatment/Exercise - 12/10/20 1717       Transfers   Transfers Sit to Stand;Stand to Sit;Lateral/Scoot Transfers    Sit to Stand 3: Mod assist    Sit to Stand Details Tactile cues for weight shifting;Verbal cues for sequencing;Verbal cues for technique;Verbal cues for safe use of DME/AE;Manual facilitation for weight shifting;Manual facilitation for placement;Manual facilitation for weight bearing;Verbal cues for precautions/safety    Sit to Stand Details (indicate cue type and reason) stood mat>Stedy with mod assist, cues for bil foot placement to widen base of support on Stedy, for increased anterior weight shifting and to "power up" through LE's; min assist to stand from [place] lift pads for 2 reps. then mod>max assist to stand mat table>RW  with cues on base of support, hand placement, and weight shifting to stand. assist/facilitation needed to keep right LE in place with transfer. performed x2 reps with improved technique on 2cd rep.    Stand to Sit 3: Mod assist    Stand to Sit Details (indicate cue type and reason) Tactile cues for weight shifting;Verbal cues for sequencing;Verbal cues for technique;Verbal cues for precautions/safety;Verbal cues for safe use of DME/AE;Manual facilitation for weight shifting;Manual facilitation for placement;Manual facilitation for weight bearing    Stand to Sit Details min assist to sit to [facility] pads with use of UE to controll descent.  mod>max assit to sit to mat table from [employer], RW x2 reps with assist for controlled descent and cues for hand placement.    Lateral/Scoot Transfers 3: Mod assist;4: Min Software engineer Details (indicate cue type and reason) continued with use of slide board transfers wheelchair<>mat table. Mod assist to intiate onto board, then min guard to min assist to complete transfers. Increased assitance needed with transfer toward right side.      Neuro Re-ed    Neuro Re-ed Details  for balance/muscle re-ed/strengthening: standing in stedy lift assist- working on full standing with hip/knee extension, cues on posture as well. once in full standing progressed to working on lateral weight shifting with assist/facilitation on posture/weight shifting; progressing to standing at [employer] with right hand orthotic, however pt's hand not staying strapped in so orthotic removed with pt holding handle. working on [employer], then weight shifting while staying tall and progressing to right stance with left LE forward/backward stepping for ~4-5 reps. min to mod assist for balance with standing at RW.      Exercises   Exercises Other Exercises    Other Exercises  supine stretching on mat table of right LE: passive stretching of hamstrings, heel cords and hip adductors for 1-2 minutes each. Spasms noted with all stretching. with right LE- hip/knee flexion<>extension with flexor synergy/tone noted with movements with increased effort needed for extension. attempted PNF techniques with increased resistance/tone noted with movements for 5 reps.                  PT Long Term Goals - 11/12/20 1303       PT LONG TERM GOAL #1   Title Pt will be able to perform HEP with caregiver assist    Time 8    Period Weeks    Status New    Target Date 01/07/21      PT LONG TERM GOAL #2   Title Pt will be able to perform all edge of bed scooting with CGA    Baseline Currently requiring  min/mod A    Time 8    Period Weeks    Status New    Target Date 01/07/21      PT LONG TERM GOAL #3   Title Pt will be able to maintain standing with min A for at least 5 minutes    Baseline Currently requiring mod A for ~30 sec    Time 8    Period Weeks    Status New    Target Date 01/07/21      PT LONG TERM GOAL #4   Title Pt will be able to perform chair transfer with min A    Baseline Stand pivot t/fs requiring max A for safety    Time 8    Period Weeks    Status New  Target Date 01/07/21      PT LONG TERM GOAL #5   Title FIST score will improve to at least 38/56 to demo MDC for improved sitting balance with decreased risk of falls    Baseline 33/56    Time 8    Period Weeks    Status New    Target Date 01/07/21                   Plan - 12/10/20 1717     Clinical Impression Statement Today's skilled session continued to focus on transfer training with slide board, sit<>stands, right LE strengthening/ROM and standing posture/balance. Pt quick to fatigue and with increased spasms in right LE at times. Pt's son reports he takes Baclofen, however prescription ran out and waiting on refill. The pt made good progress toward goals and should benefit from continued PT to progress toward unmet goals.    Personal Factors and Comorbidities Age;Time since onset of injury/illness/exacerbation    Examination-Activity Limitations Locomotion Level;Transfers;Sit;Stand;Toileting;Dressing;Hygiene/Grooming;Self Feeding;Bed Mobility;Bend;Bathing    Examination-Participation Restrictions Community Activity;Shop;Meal Prep;Yard Work    Conservation officer, historic buildings Evolving/Moderate complexity    Rehab Potential Fair    PT Frequency 2x / week    PT Duration 8 weeks    PT Treatment/Interventions ADLs/Self Care Home Management;Electrical Stimulation;DME Instruction;Gait training;Functional mobility training;Therapeutic activities;Therapeutic exercise;Balance training;Neuromuscular  re-education;Manual techniques;Patient/family education;Orthotic Fit/Training;Wheelchair mobility training;Passive range of motion;Dry needling;Taping    PT Next Visit Plan continue to work on slide board transfers for decreased caregiver burden at home, lateral scooting to work on core/sequencing of movements, standing with Stedy lift assist as able to work on weight bearing/posture, right LE stretching to address tone, strengthening as well    PT Home Exercise Plan Forward lean x20 with caregiver; standing 3x daily for at least 30 sec with caregiver    Consulted and Agree with Plan of Care Patient;Family member/caregiver    Family Member Consulted Son             Patient will benefit from skilled therapeutic intervention in order to improve the following deficits and impairments:  Abnormal gait, Difficulty walking, Impaired tone, Decreased range of motion, Decreased coordination, Decreased endurance, Impaired UE functional use, Decreased activity tolerance, Pain, Decreased balance, Decreased mobility, Decreased strength, Impaired sensation, Postural dysfunction  Visit Diagnosis: Abnormal posture  Muscle weakness (generalized)  Unsteadiness on feet  Hemiplegia and hemiparesis following cerebral infarction affecting right dominant side [facility])     Problem List Patient Active Problem List   Diagnosis Date Noted   Cerebrovascular accident (CVA) (HCC)    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   Hypertensive urgency 03/12/2020   Acute on chronic systolic CHF (congestive heart failure) (HCC) 03/12/2020   AKI (acute kidney injury) (HCC) 03/12/2020   Fall at home, initial encounter 03/12/2020   Right hip pain 03/12/2020   Hypokalemia 03/12/2020   Coronary artery disease involving native coronary artery of native heart without angina pectoris 09/07/2014   Renal insufficiency 12/02/2012   Preventative health care 11/13/2010   Secondary cardiomyopathy (HCC) 01/24/2010    TACHYCARDIA 01/24/2010   Type 2 diabetes mellitus without complication, with long-term current use of insulin (HCC) 11/20/2008   Mixed hyperlipidemia 11/12/2007   Essential hypertension 11/12/2007   CONGESTIVE HEART FAILURE 11/12/2007    Sallyanne Kuster, PTA, [facility] [address] [place], [place] [address] [phone] 12/10/20, 11:43 PM   Name: Trevor Anderson MRN: [id number] Date of Birth: 1955-05-22

## 2020-12-10 NOTE — Progress Notes (Signed)
Carelink Summary Report / Loop Recorder 

## 2020-12-15 ENCOUNTER — Other Ambulatory Visit: Payer: Self-pay

## 2020-12-15 ENCOUNTER — Encounter: Payer: Self-pay | Admitting: Physical Therapy

## 2020-12-15 ENCOUNTER — Ambulatory Visit: Payer: No Typology Code available for payment source | Admitting: Physical Therapy

## 2020-12-15 DIAGNOSIS — M6281 Muscle weakness (generalized): Secondary | ICD-10-CM

## 2020-12-15 DIAGNOSIS — R293 Abnormal posture: Secondary | ICD-10-CM

## 2020-12-15 NOTE — Therapy (Signed)
[facility] [address] [place], [place], [address] Phone: [phone]   Fax:  [phone]  Physical Therapy Treatment  Patient Details  Name: Trevor Anderson MRN: [id number] Date of Birth: Mar 10, 1956 Referring Provider (PT): Noel Gerold   Encounter Date: 12/15/2020   PT End of Session - 12/15/20 1733     Visit Number 5    Number of Visits 16    Date for PT Re-Evaluation 01/07/21    Authorization Type VA auth# [id number] (15 visits)    Authorization - Visit Number 5   updated on 8/18 to include eval visit   Authorization - Number of Visits 15    PT Start Time 1534    PT Stop Time 1616    PT Time Calculation (min) 42 min    Equipment Utilized During Treatment Gait belt    Activity Tolerance Patient tolerated treatment well   RLE spasms throughout session   Behavior During Therapy [facility] for tasks assessed/performed             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   CONGESTIVE HEART FAILURE 11/12/2007   DIABETES MELLITUS, TYPE II 11/20/2008   HYPERLIPIDEMIA 11/12/2007   HYPERTENSION 11/12/2007   TACHYCARDIA 01/24/2010    Past Surgical History:  Procedure Laterality Date   BUBBLE STUDY  03/16/2020   Procedure: BUBBLE STUDY;  Surgeon: Jodelle Red, MD;  Location: [facility] ENDOSCOPY;  Service: Cardiovascular;;   LOOP RECORDER INSERTION N/A 03/23/2020   Procedure: LOOP RECORDER INSERTION;  Surgeon: Hillis Range, MD;  [facility] INVASIVE CV LAB;  Service: Cardiovascular;  Laterality: N/A;   TEE WITHOUT CARDIOVERSION N/A 03/16/2020   Procedure: TRANSESOPHAGEAL ECHOCARDIOGRAM (TEE);  Surgeon: Jodelle Red, MD;  Location: [facility] ENDOSCOPY;  Service: Cardiovascular;  Laterality: N/A;   TONSILLECTOMY  1970    There were no vitals filed for this visit.   Subjective Assessment - 12/15/20 1539     Subjective No changes, no new complaints.  Caregiver brought in  medication list (PT had scanned into chart)    Limitations Standing;Sitting    How long can you stand comfortably? 5-10 minutes    Patient Stated Goals Open/close hand; Get more R LE motion;    Currently in Pain? No/denies    Pain Onset More than a month ago                               [facility] Adult PT Treatment/Exercise - 12/15/20 0001       Bed Mobility   Bed Mobility Sit to Sidelying Right;Rolling Left;Rolling Right;Right Sidelying to Sit    Rolling Right Minimal Assistance - Patient > 75%    Rolling Left Minimal Assistance - Patient > 75%      Transfers   Lateral/Scoot Transfers 4: Min assist    Lateral/Scoot Transfer Details (indicate cue type and reason) Performed w/c<>mat transfer, with sliding board, PT assists with board placement; to Left side both times.      Exercises   Exercises Other Exercises;Knee/Hip    Other Exercises  supine stretching on mat table of right LE: passive stretching of hamstrings, heel cords and hip adductors for 1-2 minutes each. Spasms noted with all stretching. with right LE- hip/knee flexion<>extension with flexor synergy/tone noted with movements with increased effort needed for extension. attempted PNF techniques with increased resistance/tone noted with movements for 5 reps.      Knee/Hip Exercises:  Seated   Heel Slides PROM;AAROM;Right;1 set;10 reps    Heel Slides Limitations pillow case under R foot with PT assist knee flexion and extension.    Abduction/Adduction  AAROM;Right;1 set;5 reps    Abd/Adduction Limitations pillow case under foot, PT assist to slide leg out and in      Knee/Hip Exercises: Supine   Quad Sets PROM;Right;1 set;5 reps    Bridges Strengthening;2 sets;5 reps    Other Supine Knee/Hip Exercises Supine hooklying marching initiated, with pt needing assist with RLE, spasms into flexor/adductor synergy pattern after 2-3 reps.    Other Supine Knee/Hip Exercises Supine lower trunk rotation R and L with  assistance, in hooklying position.      Knee/Hip Exercises: Sidelying   Other Sidelying Knee/Hip Exercises Attempted sidelying hip flexor stretch into extension, but pt c/o increase discomfort and increased flexor spasms in this position.             Seated forward lean>upright posture, 5 reps x 2 sets, with PT assisting with R hand placement and with maintaining weightbearing through RLE.  Seated lateral weightshifting, 2 sets x 5 reps, with assist for R hand and RLE placement.  All supine and seated exercises:  Pt requires PT assistance for RLE movement.            PT Long Term Goals - 11/12/20 1303       PT LONG TERM GOAL #1   Title Pt will be able to perform HEP with caregiver assist    Time 8    Period Weeks    Status New    Target Date 01/07/21      PT LONG TERM GOAL #2   Title Pt will be able to perform all edge of bed scooting with CGA    Baseline Currently requiring min/mod A    Time 8    Period Weeks    Status New    Target Date 01/07/21      PT LONG TERM GOAL #3   Title Pt will be able to maintain standing with min A for at least 5 minutes    Baseline Currently requiring mod A for ~30 sec    Time 8    Period Weeks    Status New    Target Date 01/07/21      PT LONG TERM GOAL #4   Title Pt will be able to perform chair transfer with min A    Baseline Stand pivot t/fs requiring max A for safety    Time 8    Period Weeks    Status New    Target Date 01/07/21      PT LONG TERM GOAL #5   Title FIST score will improve to at least 38/56 to demo MDC for improved sitting balance with decreased risk of falls    Baseline 33/56    Time 8    Period Weeks    Status New    Target Date 01/07/21                   Plan - 12/15/20 1734     Clinical Impression Statement Transfer training with sliding board-pt does better today with both sliding transfers towards stronger L side.  P/ROM in supine and sitting positions, with less spasms noted with  sitting exercises.  Pt does report getting Baclofen again and he began to take it again today.  He will continue to benefit from skilled PT to fruther address strength, flexibility, transfers  for imrpoved functional mobility.    Personal Factors and Comorbidities Age;Time since onset of injury/illness/exacerbation    Examination-Activity Limitations Locomotion Level;Transfers;Sit;Stand;Toileting;Dressing;Hygiene/Grooming;Self Feeding;Bed Mobility;Bend;Bathing    Examination-Participation Restrictions Community Activity;Shop;Meal Prep;Yard Work    Conservation officer, historic buildings Evolving/Moderate complexity    Rehab Potential Fair    PT Frequency 2x / week    PT Duration 8 weeks    PT Treatment/Interventions ADLs/Self Care Home Management;Electrical Stimulation;DME Instruction;Gait training;Functional mobility training;Therapeutic activities;Therapeutic exercise;Balance training;Neuromuscular re-education;Manual techniques;Patient/family education;Orthotic Fit/Training;Wheelchair mobility training;Passive range of motion;Dry needling;Taping    PT Next Visit Plan continue to work on slide board transfers for decreased caregiver burden at home, lateral scooting to work on core/sequencing of movements, standing with Stedy lift assist as able to work on weight bearing/posture, right LE stretching to address tone, strengthening as well    PT Home Exercise Plan Forward lean x20 with caregiver; standing 3x daily for at least 30 sec with caregiver    Consulted and Agree with Plan of Care Patient;Family member/caregiver    Family Member Consulted Son             Patient will benefit from skilled therapeutic intervention in order to improve the following deficits and impairments:  Abnormal gait, Difficulty walking, Impaired tone, Decreased range of motion, Decreased coordination, Decreased endurance, Impaired UE functional use, Decreased activity tolerance, Pain, Decreased balance, Decreased mobility,  Decreased strength, Impaired sensation, Postural dysfunction  Visit Diagnosis: Muscle weakness (generalized)  Abnormal posture     Problem List Patient Active Problem List   Diagnosis Date Noted   Cerebrovascular accident (CVA) (HCC)    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   Hypertensive urgency 03/12/2020   Acute on chronic systolic CHF (congestive heart failure) (HCC) 03/12/2020   AKI (acute kidney injury) (HCC) 03/12/2020   Fall at home, initial encounter 03/12/2020   Right hip pain 03/12/2020   Hypokalemia 03/12/2020   Coronary artery disease involving native coronary artery of native heart without angina pectoris 09/07/2014   Renal insufficiency 12/02/2012   Preventative health care 11/13/2010   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 12/15/2020, 5:38 PM Gean Maidens., PT   [facility] [address] [place], [place], [address] Phone: [phone]   Fax:  [phone]  Name: TORREY BALLINAS MRN: [id number] Date of Birth: August 20, 1955

## 2020-12-17 ENCOUNTER — Other Ambulatory Visit: Payer: Self-pay

## 2020-12-17 ENCOUNTER — Encounter: Payer: Self-pay | Admitting: Physical Therapy

## 2020-12-17 ENCOUNTER — Ambulatory Visit: Payer: No Typology Code available for payment source | Admitting: Physical Therapy

## 2020-12-17 DIAGNOSIS — R293 Abnormal posture: Secondary | ICD-10-CM | POA: Diagnosis not present

## 2020-12-17 DIAGNOSIS — I69351 Hemiplegia and hemiparesis following cerebral infarction affecting right dominant side: Secondary | ICD-10-CM

## 2020-12-17 DIAGNOSIS — M6281 Muscle weakness (generalized): Secondary | ICD-10-CM

## 2020-12-17 DIAGNOSIS — R2681 Unsteadiness on feet: Secondary | ICD-10-CM

## 2020-12-20 ENCOUNTER — Ambulatory Visit (INDEPENDENT_AMBULATORY_CARE_PROVIDER_SITE_OTHER): Payer: No Typology Code available for payment source

## 2020-12-20 DIAGNOSIS — I639 Cerebral infarction, unspecified: Secondary | ICD-10-CM | POA: Diagnosis not present

## 2020-12-20 NOTE — Therapy (Addendum)
[facility] [address] [place], [place], [address] Phone: [phone]   Fax:  [phone]  Physical Therapy Treatment  Patient Details  Name: Trevor Anderson MRN: [id number] Date of Birth: 20-Jan-1956 Referring Provider (PT): Noel Gerold   Encounter Date: 12/17/2020    12/17/20 1454  PT Visits / Re-Eval  Visit Number 6  Number of Visits 16  Date for PT Re-Evaluation 01/07/21  Authorization  Authorization Type [facility] auth# [id number] (15 visits)  Authorization - Visit Number 6  Authorization - Number of Visits 15  PT Time Calculation  PT Start Time 1448  PT Stop Time 1530  PT Time Calculation (min) 42 min  PT - End of Session  Equipment Utilized During Treatment Gait belt  Activity Tolerance Patient tolerated treatment well (RLE spasms throughout session)  Behavior During Therapy [facility] for tasks assessed/performed    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   CONGESTIVE HEART FAILURE 11/12/2007   DIABETES MELLITUS, TYPE II 11/20/2008   HYPERLIPIDEMIA 11/12/2007   HYPERTENSION 11/12/2007   TACHYCARDIA 01/24/2010    Past Surgical History:  Procedure Laterality Date   BUBBLE STUDY  03/16/2020   Procedure: BUBBLE STUDY;  Surgeon: Jodelle Red, MD;  Location: [facility] ENDOSCOPY;  Service: Cardiovascular;;   LOOP RECORDER INSERTION N/A 03/23/2020   Procedure: LOOP RECORDER INSERTION;  Surgeon: Hillis Range, MD;  Location: [facility] INVASIVE CV LAB;  Service: Cardiovascular;  Laterality: N/A;   TEE WITHOUT CARDIOVERSION N/A 03/16/2020   Procedure: TRANSESOPHAGEAL ECHOCARDIOGRAM (TEE);  Surgeon: Jodelle Red, MD;  Location: [facility] ENDOSCOPY;  Service: Cardiovascular;  Laterality: N/A;   TONSILLECTOMY  1970    There were no vitals filed for this visit.    12/17/20 1453  Symptoms/Limitations  Subjective No new complaints. No falls or pain to report. Son brought in medication  list, was scanned in by primary PT.  Limitations Standing;Sitting  How long can you stand comfortably? 5-10 minutes  Patient Stated Goals Open/close hand; Get more R LE motion;  Pain Assessment  Currently in Pain? No/denies  Pain Score 0       12/17/20 1502  Transfers  Lateral/Scoot Transfers 2: Max assist;With slide board  Lateral/Scoot Transfer Details (indicate cue type and reason) toward left from wheelchair>mat table with max assist, then toward right from mat>wheelchair with mod assist. Cues needed on weight shifting and technique.   Neuro Re-ed   Neuro Re-ed Details  for balance/strengtening: seated at edge of mat- elbow taps to yoga blocks, then progressing to mat table for ~8-9 reps each/each side with occasional assistance needed to return to upright sitting position; reaching for cones both sides, at varied heights for ~6-7 reps each side, then down to the floor to retrieve cones on both sides>center area for 6-7 cones; then had pt work on reaching behind for trunk rotation for 5-6 reps each side with cues/assist for increased trunk movements; with inverted chair with pillows- curl ups with arms across chest for 10 reps with emphasis on staying in midline position.   Exercises  Exercises Other Exercises  Other Exercises  supine stretching on mat table of right LE: passive stretching of hamstrings, heel cords and hip adductors for 1-2 minutes each. Spasms noted with all stretching. with right LE- hip/knee flexion<>extension with flexor synergy/tone noted with movements with increased effort needed for extension. attempted PNF techniques with increased resistance/tone noted with movements for 5 reps.  PT Long Term Goals - 11/12/20 1303       PT LONG TERM GOAL #1   Title Pt will be able to perform HEP with caregiver assist    Time 8    Period Weeks    Status New    Target Date 01/07/21      PT LONG TERM GOAL #2   Title Pt will be able to perform all edge of  bed scooting with CGA    Baseline Currently requiring min/mod A    Time 8    Period Weeks    Status New    Target Date 01/07/21      PT LONG TERM GOAL #3   Title Pt will be able to maintain standing with min A for at least 5 minutes    Baseline Currently requiring mod A for ~30 sec    Time 8    Period Weeks    Status New    Target Date 01/07/21      PT LONG TERM GOAL #4   Title Pt will be able to perform chair transfer with min A    Baseline Stand pivot t/fs requiring max A for safety    Time 8    Period Weeks    Status New    Target Date 01/07/21      PT LONG TERM GOAL #5   Title FIST score will improve to at least 38/56 to demo MDC for improved sitting balance with decreased risk of falls    Baseline 33/56    Time 8    Period Weeks    Status New    Target Date 01/07/21              12/17/20 1454  Plan  Clinical Impression Statement Today's skilled session continued to focus on stretching, core/balance strengthening at edge of mat and slide board transfers for decreased caregiver  burden at home. Pt continues to have spasms with certain movements. The pt is making progress and should benefit from continued PT to progress toward unmet goals.  Personal Factors and Comorbidities Age;Time since onset of injury/illness/exacerbation  Examination-Activity Limitations Locomotion Level;Transfers;Sit;Stand;Toileting;Dressing;Hygiene/Grooming;Self Feeding;Bed Mobility;Bend;Bathing  Examination-Participation Restrictions Community Activity;Shop;Meal Prep;Yard Work  Pt will benefit from skilled therapeutic intervention in order to improve on the following deficits Abnormal gait;Difficulty walking;Impaired tone;Decreased range of motion;Decreased coordination;Decreased endurance;Impaired UE functional use;Decreased activity tolerance;Pain;Decreased balance;Decreased mobility;Decreased strength;Impaired sensation;Postural dysfunction  Stability/Clinical Decision Making Evolving/Moderate  complexity  Rehab Potential Fair  PT Frequency 2x / week  PT Duration 8 weeks  PT Treatment/Interventions ADLs/Self Care Home Management;Electrical Stimulation;DME Instruction;Gait training;Functional mobility training;Therapeutic activities;Therapeutic exercise;Balance training;Neuromuscular re-education;Manual techniques;Patient/family education;Orthotic Fit/Training;Wheelchair mobility training;Passive range of motion;Dry needling;Taping  PT Next Visit Plan continue to work on slide board transfers for decreased caregiver burden at home, lateral scooting to work on core/sequencing of movements, standing with Stedy lift assist as able to work on weight bearing/posture, right LE stretching to address tone, strengthening as well  PT Home Exercise Plan Forward lean x20 with caregiver; standing 3x daily for at least 30 sec with caregiver  Consulted and Agree with Plan of Care Patient;Family member/caregiver  Family Member Consulted Son          Patient will benefit from skilled therapeutic intervention in order to improve the following deficits and impairments:  Abnormal gait, Difficulty walking, Impaired tone, Decreased range of motion, Decreased coordination, Decreased endurance, Impaired UE functional use, Decreased activity tolerance, Pain, Decreased balance, Decreased mobility, Decreased strength, Impaired sensation, Postural dysfunction  Visit Diagnosis: Muscle weakness (generalized)  Abnormal posture  Unsteadiness on feet  Hemiplegia and hemiparesis following cerebral infarction affecting right dominant side [facility])     Problem List Patient Active Problem List   Diagnosis Date Noted   Cerebrovascular accident (CVA) (HCC)    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   Hypertensive urgency 03/12/2020   Acute on chronic systolic CHF (congestive heart failure) (HCC) 03/12/2020   AKI (acute kidney injury) (HCC) 03/12/2020   Fall at home, initial encounter 03/12/2020    Right hip pain 03/12/2020   Hypokalemia 03/12/2020   Coronary artery disease involving native coronary artery of native heart without angina pectoris 09/07/2014   Renal insufficiency 12/02/2012   Preventative health care 11/13/2010   Secondary cardiomyopathy (HCC) 01/24/2010   TACHYCARDIA 01/24/2010   Type 2 diabetes mellitus without complication, with long-term current use of insulin (HCC) 11/20/2008   Mixed hyperlipidemia 11/12/2007   Essential hypertension 11/12/2007   CONGESTIVE HEART FAILURE 11/12/2007    Sallyanne Kuster, PTA, [facility] [address] [place], [place] [address] [phone] 12/20/20, 9:17 AM   Name: Trevor Anderson MRN: [id number] Date of Birth: 09-12-1955

## 2020-12-21 LAB — CUP PACEART REMOTE DEVICE CHECK
Date Time Interrogation Session: 20220826174150
Implantable Pulse Generator Implant Date: 20211130

## 2020-12-22 ENCOUNTER — Ambulatory Visit: Payer: No Typology Code available for payment source | Admitting: Physical Therapy

## 2020-12-22 ENCOUNTER — Other Ambulatory Visit: Payer: Self-pay

## 2020-12-22 DIAGNOSIS — R2681 Unsteadiness on feet: Secondary | ICD-10-CM

## 2020-12-22 DIAGNOSIS — I69351 Hemiplegia and hemiparesis following cerebral infarction affecting right dominant side: Secondary | ICD-10-CM

## 2020-12-22 DIAGNOSIS — R293 Abnormal posture: Secondary | ICD-10-CM | POA: Diagnosis not present

## 2020-12-22 DIAGNOSIS — M6281 Muscle weakness (generalized): Secondary | ICD-10-CM

## 2020-12-22 NOTE — Therapy (Addendum)
[facility] [address] [place], [place], [address] Phone: [phone]   Fax:  [phone]  Physical Therapy Treatment  Patient Details  Name: Trevor Anderson MRN: [id number] Date of Birth: Jul 12, 1955 Referring Provider (PT): Noel Gerold   Encounter Date: 12/22/2020   PT End of Session - 12/22/20 1408     Visit Number 7    Number of Visits 16    Date for PT Re-Evaluation 01/07/21    Authorization Type VA auth# [id number] (15 visits)    Authorization - Visit Number 7    Authorization - Number of Visits 15    PT Start Time 1405    PT Stop Time 1445    PT Time Calculation (min) 40 min    Equipment Utilized During Treatment Gait belt    Activity Tolerance Patient tolerated treatment well   RLE spasms throughout session   Behavior During Therapy [facility] for tasks assessed/performed             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   CONGESTIVE HEART FAILURE 11/12/2007   DIABETES MELLITUS, TYPE II 11/20/2008   HYPERLIPIDEMIA 11/12/2007   HYPERTENSION 11/12/2007   TACHYCARDIA 01/24/2010    Past Surgical History:  Procedure Laterality Date   BUBBLE STUDY  03/16/2020   Procedure: BUBBLE STUDY;  Surgeon: Jodelle Red, MD;  Location: [facility] ENDOSCOPY;  Service: Cardiovascular;;   LOOP RECORDER INSERTION N/A 03/23/2020   Procedure: LOOP RECORDER INSERTION;  Surgeon: Hillis Range, MD;  Location: [facility] INVASIVE CV LAB;  Service: Cardiovascular;  Laterality: N/A;   TEE WITHOUT CARDIOVERSION N/A 03/16/2020   Procedure: TRANSESOPHAGEAL ECHOCARDIOGRAM (TEE);  Surgeon: Jodelle Red, MD;  Location: [facility] ENDOSCOPY;  Service: Cardiovascular;  Laterality: N/A;   TONSILLECTOMY  1970    There were no vitals filed for this visit.   Subjective Assessment - 12/22/20 1407     Subjective No new complaints. No falls or pain to report. Does state he is tired today as he did not sleep  last night.    Limitations Standing;Sitting    How long can you stand comfortably? 5-10 minutes    Patient Stated Goals Open/close hand; Get more R LE motion;    Currently in Pain? No/denies    Pain Score 0-No pain                     [facility] Adult PT Treatment/Exercise - 12/22/20 1408       Transfers   Transfers Sit to Stand;Stand to Sit;Lateral/Scoot Transfers    Sit to Stand 3: Mod assist;4: Min assist    Sit to Stand Details (indicate cue type and reason) min from lift pads of Stedy, otherwise mod from lower surface of mat table (mat table elevated to  24inches from floor).    Stand to Sit 3: Mod assist;4: Min assist    Stand to Sit Details min assist to sit controlled to lift pads of Stedy. otherwise mod assist for controlled descent to mat table (mat table elevated)    Lateral/Scoot Transfers 2: Max assist;With slide board    Number of Reps Other reps (comment)   x3 with Stedy, x3 with RW     Therapeutic Activites    Therapeutic Activities Other Therapeutic Activities    Other Therapeutic Activities standing in [place]: working on tall posture, hip/knee extension on all stands. on 2cd stand worked on mini squats for 10 reps after getting tall. on 3rd rep- worked  on stepping in place after gettng tall.  Standing with RW with right hand orthotic: cues/faciliation to get into tall posture, then working on lateral weight shifting with 1st stand. with 2cd and 3rd stands began to work on stepping out/it, then forward backward with stance knee blocked for stability.      Exercises   Exercises Other Exercises    Other Exercises  seated at edge of mat: passive hamstring stretch for 30 seconds x3 reps.                         PT Long Term Goals - 11/12/20 1303       PT LONG TERM GOAL #1   Title Pt will be able to perform HEP with caregiver assist    Time 8    Period Weeks    Status New    Target Date 01/07/21      PT LONG TERM GOAL #2   Title Pt will be able  to perform all edge of bed scooting with CGA    Baseline Currently requiring min/mod A    Time 8    Period Weeks    Status New    Target Date 01/07/21      PT LONG TERM GOAL #3   Title Pt will be able to maintain standing with min A for at least 5 minutes    Baseline Currently requiring mod A for ~30 sec    Time 8    Period Weeks    Status New    Target Date 01/07/21      PT LONG TERM GOAL #4   Title Pt will be able to perform chair transfer with min A    Baseline Stand pivot t/fs requiring max A for safety    Time 8    Period Weeks    Status New    Target Date 01/07/21      PT LONG TERM GOAL #5   Title FIST score will improve to at least 38/56 to demo MDC for improved sitting balance with decreased risk of falls    Baseline 33/56    Time 8    Period Weeks    Status New    Target Date 01/07/21               12/22/20 1408  Plan  Clinical Impression Statement Today's skilled session continued to focus on use of slide board for wheelchair<>mat table with increased assist to initiate movements, then decreased assistance needed. Also continued to work on standing with [facility] lift assist, then with RW. Assistance needed in standing for posture and balance. Pt continues to have increased right LE spasticity with movements. The pt is progressing toward goals and should benefit from continued PT to progresss toward unmet goals.  Personal Factors and Comorbidities Age;Time since onset of injury/illness/exacerbation  Examination-Activity Limitations Locomotion Level;Transfers;Sit;Stand;Toileting;Dressing;Hygiene/Grooming;Self Feeding;Bed Mobility;Bend;Bathing  Examination-Participation Restrictions Community Activity;Shop;Meal Prep;Yard Work  Pt will benefit from skilled therapeutic intervention in order to improve on the following deficits Abnormal gait;Difficulty walking;Impaired tone;Decreased range of motion;Decreased coordination;Decreased endurance;Impaired UE functional  use;Decreased activity tolerance;Pain;Decreased balance;Decreased mobility;Decreased strength;Impaired sensation;Postural dysfunction  Stability/Clinical Decision Making Evolving/Moderate complexity  Rehab Potential Fair  PT Frequency 2x / week  PT Duration 8 weeks  PT Treatment/Interventions ADLs/Self Care Home Management;Electrical Stimulation;DME Instruction;Gait training;Functional mobility training;Therapeutic activities;Therapeutic exercise;Balance training;Neuromuscular re-education;Manual techniques;Patient/family education;Orthotic Fit/Training;Wheelchair mobility training;Passive range of motion;Dry needling;Taping  PT Next Visit Plan continue to work on [employer] transfers  for decreased caregiver burden at home, lateral scooting to work on core/sequencing of movements, standing with Stedy lift assist as able to work on weight bearing/posture, right LE stretching to address tone, strengthening as well  PT Home Exercise Plan Forward lean x20 with caregiver; standing 3x daily for at least 30 sec with caregiver  Consulted and Agree with Plan of Care Patient;Family member/caregiver  Family Member Consulted Son       Patient will benefit from skilled therapeutic intervention in order to improve the following deficits and impairments:  Abnormal gait, Difficulty walking, Impaired tone, Decreased range of motion, Decreased coordination, Decreased endurance, Impaired UE functional use, Decreased activity tolerance, Pain, Decreased balance, Decreased mobility, Decreased strength, Impaired sensation, Postural dysfunction  Visit Diagnosis: Muscle weakness (generalized)  Abnormal posture  Unsteadiness on feet  Hemiplegia and hemiparesis following cerebral infarction affecting right dominant side [facility])     Problem List Patient Active Problem List   Diagnosis Date Noted   Cerebrovascular accident (CVA) (HCC)    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   Hypertensive  urgency 03/12/2020   Acute on chronic systolic CHF (congestive heart failure) (HCC) 03/12/2020   AKI (acute kidney injury) (HCC) 03/12/2020   Fall at home, initial encounter 03/12/2020   Right hip pain 03/12/2020   Hypokalemia 03/12/2020   Coronary artery disease involving native coronary artery of native heart without angina pectoris 09/07/2014   Renal insufficiency 12/02/2012   Preventative health care 11/13/2010   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

## 2020-12-24 ENCOUNTER — Ambulatory Visit: Payer: No Typology Code available for payment source | Attending: Adult Health | Admitting: Physical Therapy

## 2020-12-24 ENCOUNTER — Other Ambulatory Visit: Payer: Self-pay

## 2020-12-24 ENCOUNTER — Encounter: Payer: Self-pay | Admitting: Physical Therapy

## 2020-12-24 DIAGNOSIS — M25611 Stiffness of right shoulder, not elsewhere classified: Secondary | ICD-10-CM | POA: Diagnosis present

## 2020-12-24 DIAGNOSIS — M25621 Stiffness of right elbow, not elsewhere classified: Secondary | ICD-10-CM | POA: Insufficient documentation

## 2020-12-24 DIAGNOSIS — R293 Abnormal posture: Secondary | ICD-10-CM | POA: Insufficient documentation

## 2020-12-24 DIAGNOSIS — R2681 Unsteadiness on feet: Secondary | ICD-10-CM | POA: Insufficient documentation

## 2020-12-24 DIAGNOSIS — I69351 Hemiplegia and hemiparesis following cerebral infarction affecting right dominant side: Secondary | ICD-10-CM | POA: Insufficient documentation

## 2020-12-24 DIAGNOSIS — M6281 Muscle weakness (generalized): Secondary | ICD-10-CM | POA: Insufficient documentation

## 2020-12-24 DIAGNOSIS — R41840 Attention and concentration deficit: Secondary | ICD-10-CM | POA: Diagnosis present

## 2020-12-24 DIAGNOSIS — R278 Other lack of coordination: Secondary | ICD-10-CM | POA: Insufficient documentation

## 2020-12-28 NOTE — Therapy (Addendum)
[facility] [address] [place], [place], [address] Phone: [phone]   Fax:  [phone]  Physical Therapy Treatment  Patient Details  Name: Trevor Anderson MRN: [id number] Date of Birth: April 01, 1956 Referring Provider (PT): Noel Gerold   Encounter Date: 12/24/2020    12/24/20 1453  PT Visits / Re-Eval  Visit Number 8  Number of Visits 16  Date for PT Re-Evaluation 01/07/21  Authorization  Authorization Type VA auth# [id number] (15 visits)  Authorization - Visit Number 8  Authorization - Number of Visits 15  PT Time Calculation  PT Start Time 1448  PT Stop Time 1530  PT Time Calculation (min) 42 min  PT - End of Session  Equipment Utilized During Treatment Gait belt  Activity Tolerance Patient tolerated treatment well (RLE spasms throughout session)  Behavior During Therapy [facility] for tasks assessed/performed    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   CONGESTIVE HEART FAILURE 11/12/2007   DIABETES MELLITUS, TYPE II 11/20/2008   HYPERLIPIDEMIA 11/12/2007   HYPERTENSION 11/12/2007   TACHYCARDIA 01/24/2010    Past Surgical History:  Procedure Laterality Date   BUBBLE STUDY  03/16/2020   Procedure: BUBBLE STUDY;  Surgeon: Jodelle Red, MD;  Location: [facility] ENDOSCOPY;  Service: Cardiovascular;;   LOOP RECORDER INSERTION N/A 03/23/2020   Procedure: LOOP RECORDER INSERTION;  Surgeon: Hillis Range, MD;  Location: [facility] INVASIVE CV LAB;  Service: Cardiovascular;  Laterality: N/A;   TEE WITHOUT CARDIOVERSION N/A 03/16/2020   Procedure: TRANSESOPHAGEAL ECHOCARDIOGRAM (TEE);  Surgeon: Jodelle Red, MD;  Location: [facility] ENDOSCOPY;  Service: Cardiovascular;  Laterality: N/A;   TONSILLECTOMY  1970    There were no vitals filed for this visit.    12/24/20 1751  Symptoms/Limitations  Subjective No new complaints. No falls or pain to report. In clinic wheelchair as  son had to bring him due to transportation not showing up.  Limitations Standing;Sitting  How long can you stand comfortably? 5-10 minutes  Patient Stated Goals Open/close hand; Get more R LE motion;  Pain Assessment  Currently in Pain? No/denies  Pain Score 0     12/24/20 1752  Transfers  Transfers Sit to Stand;Stand to Sit;Lateral/Scoot Transfers;Stand Pivot Transfers  Sit to Stand 3: Mod assist;2: Max assist  Sit to Stand Details (indicate cue type and reason) x1 from wheelchaiar, x 3-4 reps from mat table all to RW. cues each time for weight shifting, hand placement and LE placement. assist needed to keep right LE from going into flexor moment due to spasms.  Stand to Sit 3: Mod assist;2: Max assist  Stand to Sit Details mod/max assist for controlled descent to mat table several times and wheelchair x1 after stand pivot transfer.  Stand Pivot Transfers 3: Mod assist;2: Max assist  Stand Pivot Transfer Details (indicate cue type and reason) mod assist of 2 to stand wheelchair>RW, then pivot turn performed wheelchair<>mat table with 2 person mod/max assist with cues for sequencing/posture. assist needed for LE advancement, posture and walker managment.  Therapeutic Activites   Therapeutic Activities Other Therapeutic Activities  Other Therapeutic Activities Standing with RW with right hand orthotic x1, remainder without the orthotic (does better without it). Added anterior Ott[place]ock brace for last 3 stands: cues/faciliation to get into tall posture, then working on lateral weight shifting with 1st stand. with 2cd - 4th stands began to work on stepping out/it, then forward backward with stance knee blocked for stability.  Exercises  Exercises Other Exercises  Other Exercises  seated at edge of mat: passive hamstring stretch for 30 seconds x3 reps.         PT Long Term Goals - 11/12/20 1303       PT LONG TERM GOAL #1   Title Pt will be able to perform HEP with caregiver assist     Time 8    Period Weeks    Status New    Target Date 01/07/21      PT LONG TERM GOAL #2   Title Pt will be able to perform all edge of bed scooting with CGA    Baseline Currently requiring min/mod A    Time 8    Period Weeks    Status New    Target Date 01/07/21      PT LONG TERM GOAL #3   Title Pt will be able to maintain standing with min A for at least 5 minutes    Baseline Currently requiring mod A for ~30 sec    Time 8    Period Weeks    Status New    Target Date 01/07/21      PT LONG TERM GOAL #4   Title Pt will be able to perform chair transfer with min A    Baseline Stand pivot t/fs requiring max A for safety    Time 8    Period Weeks    Status New    Target Date 01/07/21      PT LONG TERM GOAL #5   Title FIST score will improve to at least 38/56 to demo [place] for improved sitting balance with decreased risk of falls    Baseline 33/56    Time 8    Period Weeks    Status New    Target Date 01/07/21              12/24/20 1454  Plan  Clinical Impression Statement Today's skilled session initiated stand pivot transfers with RW with 2 person max assist needed. Remainder of session continued to focus on standing with RW working on posture and pregait activities after stretching of right LE at edge of mat table. The pt is making progress and should benefit from continued PT to progress toward unmet goals.  Personal Factors and Comorbidities Age;Time since onset of injury/illness/exacerbation  Examination-Activity Limitations Locomotion Level;Transfers;Sit;Stand;Toileting;Dressing;Hygiene/Grooming;Self Feeding;Bed Mobility;Bend;Bathing  Examination-Participation Restrictions Community Activity;Shop;Meal Prep;Yard Work  Pt will benefit from skilled therapeutic intervention in order to improve on the following deficits Abnormal gait;Difficulty walking;Impaired tone;Decreased range of motion;Decreased coordination;Decreased endurance;Impaired UE functional use;Decreased  activity tolerance;Pain;Decreased balance;Decreased mobility;Decreased strength;Impaired sensation;Postural dysfunction  Stability/Clinical Decision Making Evolving/Moderate complexity  Rehab Potential Fair  PT Frequency 2x / week  PT Duration 8 weeks  PT Treatment/Interventions ADLs/Self Care Home Management;Electrical Stimulation;DME Instruction;Gait training;Functional mobility training;Therapeutic activities;Therapeutic exercise;Balance training;Neuromuscular re-education;Manual techniques;Patient/family education;Orthotic Fit/Training;Wheelchair mobility training;Passive range of motion;Dry needling;Taping  PT Next Visit Plan continue to work on slide board transfers for decreased caregiver burden at home, lateral scooting to work on core/sequencing of movements, standing with RW with 2 person assist working on [employer] gait activities  PT Home Exercise Plan Forward lean x20 with caregiver; standing 3x daily for at least 30 sec with caregiver  Consulted and Agree with Plan of Care Patient;Family member/caregiver  Family Member Consulted Son          Patient will benefit from skilled therapeutic intervention in order to improve the following deficits and impairments:  Abnormal gait, Difficulty walking, Impaired tone, Decreased range of  motion, Decreased coordination, Decreased endurance, Impaired UE functional use, Decreased activity tolerance, Pain, Decreased balance, Decreased mobility, Decreased strength, Impaired sensation, Postural dysfunction  Visit Diagnosis: Muscle weakness (generalized)  Abnormal posture  Unsteadiness on feet  Hemiplegia and hemiparesis following cerebral infarction affecting right dominant side [facility])     Problem List Patient Active Problem List   Diagnosis Date Noted   Cerebrovascular accident (CVA) (HCC)    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   Hypertensive urgency 03/12/2020   Acute on chronic systolic CHF (congestive heart  failure) (HCC) 03/12/2020   AKI (acute kidney injury) (HCC) 03/12/2020   Fall at home, initial encounter 03/12/2020   Right hip pain 03/12/2020   Hypokalemia 03/12/2020   Coronary artery disease involving native coronary artery of native heart without angina pectoris 09/07/2014   Renal insufficiency 12/02/2012   Preventative health care 11/13/2010   Secondary cardiomyopathy (HCC) 01/24/2010   TACHYCARDIA 01/24/2010   Type 2 diabetes mellitus without complication, with long-term current use of insulin (HCC) 11/20/2008   Mixed hyperlipidemia 11/12/2007   Essential hypertension 11/12/2007   CONGESTIVE HEART FAILURE 11/12/2007    Sallyanne Kuster, PTA, [facility] Outpatient Neuro [facility] [address], [address] [place], [place] [address] [phone] 12/28/20, 5:59 PM   Name: JAMARCO ZALDIVAR MRN: [id number] Date of Birth: 09-23-1955

## 2020-12-29 ENCOUNTER — Ambulatory Visit: Payer: No Typology Code available for payment source | Admitting: Physical Therapy

## 2020-12-29 ENCOUNTER — Encounter: Payer: Self-pay | Admitting: Physical Therapy

## 2020-12-29 ENCOUNTER — Other Ambulatory Visit: Payer: Self-pay

## 2020-12-29 DIAGNOSIS — R293 Abnormal posture: Secondary | ICD-10-CM

## 2020-12-29 DIAGNOSIS — R2681 Unsteadiness on feet: Secondary | ICD-10-CM

## 2020-12-29 DIAGNOSIS — M6281 Muscle weakness (generalized): Secondary | ICD-10-CM | POA: Diagnosis not present

## 2020-12-29 NOTE — Patient Instructions (Addendum)
Access Code: [facility] URL: [email or web address] Date: 12/29/2020 Prepared by: Lonia Blood  Exercises Seated Hip Adduction Isometrics with [employer] - 1-2 x daily - 5 x weekly - 1-2 sets - 10 reps - 3 sec hold  Wrote in and instructed caregiver with bilat feet placed on ground, with pt sitting in w/c, forward/back rocking for weightbearing through RLE.

## 2020-12-29 NOTE — Therapy (Signed)
[facility] [address] [place], [place], [address] Phone: [phone]   Fax:  [phone]  Physical Therapy Treatment  Patient Details  Name: Trevor Anderson MRN: [id number] Date of Birth: 1955/11/06 Referring Provider (PT): Deland Pretty   Encounter Date: 12/29/2020   PT End of Session - 12/29/20 1431     Visit Number 9    Number of Visits 16    Date for PT Re-Evaluation 01/07/21    Authorization Type VA auth# [id number] (15 visits)    Authorization - Visit Number 9    Authorization - Number of Visits 15    PT Start Time 4401    PT Stop Time 0272    PT Time Calculation (min) 49 min    Equipment Utilized During Treatment Gait belt    Activity Tolerance Patient tolerated treatment well   RLE spasms throughout session   Behavior During Therapy [facility] for tasks assessed/performed             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   CONGESTIVE HEART FAILURE 11/12/2007   DIABETES MELLITUS, TYPE II 11/20/2008   HYPERLIPIDEMIA 11/12/2007   HYPERTENSION 11/12/2007   TACHYCARDIA 01/24/2010    Past Surgical History:  Procedure Laterality Date   BUBBLE STUDY  03/16/2020   Procedure: BUBBLE STUDY;  Surgeon: Buford Dresser, MD;  Location: [place];  Service: Cardiovascular;;   LOOP RECORDER INSERTION N/A 03/23/2020   Procedure: LOOP RECORDER INSERTION;  Surgeon: Thompson Grayer, MD;  Location: [place] CV LAB;  Service: Cardiovascular;  Laterality: N/A;   TEE WITHOUT CARDIOVERSION N/A 03/16/2020   Procedure: TRANSESOPHAGEAL ECHOCARDIOGRAM (TEE);  Surgeon: Buford Dresser, MD;  Location: [facility] ENDOSCOPY;  Service: Cardiovascular;  Laterality: N/A;   TONSILLECTOMY  1970    There were no vitals filed for this visit.   Subjective Assessment - 12/29/20 1432     Subjective Nothing new since last visit.  No pain at beginning of session.    Limitations Standing;Sitting     How long can you stand comfortably? 5-10 minutes    Patient Stated Goals Open/close hand; Get more R LE motion;    Currently in Pain? No/denies                               [facility] Adult PT Treatment/Exercise - 12/29/20 0001       Transfers   Transfers Sit to Stand;Stand to Sit;Lateral/Scoot Transfers    Sit to Stand 3: Mod assist;2: Max assist    Sit to Stand Details Tactile cues for weight shifting;Verbal cues for sequencing;Verbal cues for technique;Verbal cues for safe use of DME/AE;Manual facilitation for weight shifting;Manual facilitation for placement;Manual facilitation for weight bearing;Verbal cues for precautions/safety    Sit to Stand Details (indicate cue type and reason) mat table to upright seat in [place].  From upright seat in [place] to stand, x 3 reps with min assist.    Stand to Sit 3: Mod assist;2: Max assist    Stand to Sit Details (indicate cue type and reason) Tactile cues for weight shifting;Verbal cues for sequencing;Verbal cues for technique;Verbal cues for precautions/safety;Verbal cues for safe use of DME/AE;Manual facilitation for weight shifting;Manual facilitation for placement;Manual facilitation for weight bearing    Stand Pivot Transfers --    Lateral/Scoot Transfers 2: Max assist;With slide board;3: Mod assist    Lateral/Scoot Transfer Details (indicate cue type and reason) toward L  side, w/c<>mat, PT assists with w/c armest removal and sliding board placement.      High Level Balance   High Level Balance Comments Seated Function in Sitting Test (FIST) Results:  43/56 (pt scores 4's except for:  pick up object from floor (0), posterior scooting (1), anterior scooting (1), lateral scooting (1)-needing max assist; total score of 43/56 has improved from eval of 33/56.      Therapeutic Activites    Therapeutic Activities Other Therapeutic Activities      Neuro Re-ed    Neuro Re-ed Details  sit>stand from elevated seate in [facility];  with therapist facilitation/manual cues through RLE for increased weigthshifting and weightbearing.  In standing supported in [place], PT provides manual cues at posterior hips/buttocks for more upright standing, and tactile cues to increase weightshift to R side.  Standing approx 3-4 minutes, then sit<>stand from elevated seat x 10 reps, then standing additional 45 seconds, 30 seconds.      Exercises   Exercises Other Exercises    Other Exercises  Seated at edge of mat, attempts at forward lean, x 5 reps, with pt very rigid through trunk, PT assist to increase forward lean slightly.      Knee/Hip Exercises: Seated   Other Seated Knee/Hip Exercises Seated hip adduction with pillow squeeze, x 5 reps, 2 sets    Other Seated Knee/Hip Exercises seated in w/c with bilat feet flat on floor, forward/back rocking, keeping RLE weightbearing, assist to maintain neutral RLE position (to prevent adduction); seated w/c lower extremity propulsion, with assist for RLE, cues for kick and heel dig to increase weightbearing through RLE. 5 ft x 2 reps.                     PT Education - 12/29/20 1755     Education Details Addition to HEP    Person(s) Educated Patient;Caregiver(s)   Jonelle Sidle, caregiver   Methods Explanation;Demonstration;Verbal cues;Tactile cues;Handout    Comprehension Verbalized understanding;Returned demonstration;Need further instruction   will need caregiver assist                PT Long Term Goals - 12/29/20 1757       PT LONG TERM GOAL #1   Title Pt will be able to perform HEP with caregiver assist    Baseline updated HEP provided to pt and caregiver 12/29/2020 (not yet met)    Time 8    Period Weeks    Status On-going      PT LONG TERM GOAL #2   Title Pt will be able to perform all edge of bed scooting with CGA    Baseline Currently requiring min/mod A; 12/29/20:  mod/max assist for scooting (not yet met)    Time 8    Period Weeks    Status On-going      PT LONG  TERM GOAL #3   Title Pt will be able to maintain standing with min A for at least 5 minutes    Baseline Currently requiring mod A for ~30 sec; 12/29/20:  stands in [place] 3-4 minutes prior to needing sitting break (not yet met)    Time 8    Period Weeks    Status On-going      PT LONG TERM GOAL #4   Title Pt will be able to perform chair transfer with min A    Baseline Stand pivot t/fs requiring max A for safety; 12/29/20:  mod assist at best with set-up assistance using sliding  board to L side (not yet met)    Time 8    Period Weeks    Status On-going      PT LONG TERM GOAL #5   Title FIST score will improve to at least 38/56 to demo [place] for improved sitting balance with decreased risk of falls    Baseline 33/56; 12/29/20:  43/56    Time 8    Period Weeks    Status Achieved                   Plan - 12/29/20 1759     Clinical Impression Statement Assessed LTGs this visit (as evaluating therapist only set LTGs and this therapist did not realize at first time seeing pt several weeks ago that goals needed to be assessed).  LTG 1-4 ongoing and not yet met; HEP was initiated at eval and was updated today's visit.  Pt continues to need mod/max assist with scooting and transfers due to increased tone in RLE; therefore goals for transfers are not yet met and ongoing.  Pt has improved standing tolerance (in [place] standing frame) to 3-4 minutes, just not to goal level.  LTG 5 met for improved sitting balance with FIST score improved from 33/56 to 43/56.  Pt is progressing towards goals and continues to benefit from skilled PT to address strength, flexibility, NMR to RLE and sitting/standing balance and tolerance activities.  He will continue to benefit from skilled PT towards LTGs.    Personal Factors and Comorbidities Age;Time since onset of injury/illness/exacerbation    Examination-Activity Limitations Locomotion Level;Transfers;Sit;Stand;Toileting;Dressing;Hygiene/Grooming;Self Feeding;Bed  Mobility;Bend;Bathing    Examination-Participation Restrictions Community Activity;Shop;Meal Prep;Yard Work    [employer] Evolving/Moderate complexity    Rehab Potential Fair    PT Frequency 2x / week    PT Duration 8 weeks    PT Treatment/Interventions ADLs/Self Care Home Management;Electrical Stimulation;DME Instruction;Gait training;Functional mobility training;Therapeutic activities;Therapeutic exercise;Balance training;Neuromuscular re-education;Manual techniques;Patient/family education;Orthotic Fit/Training;Wheelchair mobility training;Passive range of motion;Dry needling;Taping    PT Next Visit Plan Continue towards LTGs; check updates to HEP.  Include caregiver or son in education for sliding board and trasnfer training.  continue to work on [employer] transfers for decreased caregiver burden at home, lateral scooting to work on core/sequencing of movements, standing with RW with 2 person assist working on [employer] gait activities. (By 01/07/21, PT to formally assess LTGs and complete recert; need to discuss with pt/family requesting additional visits from [place] and scheduling)    PT Home Exercise Plan Forward lean x20 with caregiver; standing 3x daily for at least 30 sec with caregiver; [place] 12/29/20    Consulted and Agree with Plan of Care Patient;Family member/caregiver    Family Member Consulted Son             Patient will benefit from skilled therapeutic intervention in order to improve the following deficits and impairments:  Abnormal gait, Difficulty walking, Impaired tone, Decreased range of motion, Decreased coordination, Decreased endurance, Impaired UE functional use, Decreased activity tolerance, Pain, Decreased balance, Decreased mobility, Decreased strength, Impaired sensation, Postural dysfunction  Visit Diagnosis: Muscle weakness (generalized)  Abnormal posture  Unsteadiness on feet     Problem List Patient Active Problem List    Diagnosis Date Noted   Cerebrovascular accident (CVA) ([place])    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   Hypertensive urgency 03/12/2020   Acute on chronic systolic CHF (congestive heart failure) ([place]) 03/12/2020   AKI (acute kidney injury) ([place]) 03/12/2020   Fall at  home, initial encounter 03/12/2020   Right hip pain 03/12/2020   Hypokalemia 03/12/2020   Coronary artery disease involving native coronary artery of native heart without angina pectoris 09/07/2014   Renal insufficiency 12/02/2012   Preventative health care 11/13/2010   Secondary cardiomyopathy ([place]) 01/24/2010   TACHYCARDIA 01/24/2010   Type 2 diabetes mellitus without complication, with long-term current use of insulin ([place]) 11/20/2008   Mixed hyperlipidemia 11/12/2007   Essential hypertension 11/12/2007   CONGESTIVE HEART FAILURE 11/12/2007    Meagen Limones W., PT 12/29/2020, 6:06 PM Frazier Butt., PT  [facility] [address] [place], [place], [address] Phone: [phone]   Fax:  [phone]  Name: Trevor Anderson MRN: [id number] Date of Birth: [birth year]

## 2020-12-31 ENCOUNTER — Other Ambulatory Visit: Payer: Self-pay

## 2020-12-31 ENCOUNTER — Encounter: Payer: Self-pay | Admitting: Physical Therapy

## 2020-12-31 ENCOUNTER — Ambulatory Visit: Payer: No Typology Code available for payment source | Admitting: Physical Therapy

## 2020-12-31 DIAGNOSIS — R2681 Unsteadiness on feet: Secondary | ICD-10-CM

## 2020-12-31 DIAGNOSIS — I69351 Hemiplegia and hemiparesis following cerebral infarction affecting right dominant side: Secondary | ICD-10-CM

## 2020-12-31 DIAGNOSIS — M6281 Muscle weakness (generalized): Secondary | ICD-10-CM | POA: Diagnosis not present

## 2020-12-31 DIAGNOSIS — R293 Abnormal posture: Secondary | ICD-10-CM

## 2020-12-31 NOTE — Progress Notes (Signed)
Carelink Summary Report / Loop Recorder 

## 2021-01-02 NOTE — Therapy (Signed)
[facility] [address] [place], [place], [address] Phone: [phone]   Fax:  [phone]  Physical Therapy Treatment  Patient Details  Name: Trevor Anderson MRN: [id number] Date of Birth: Dec 31, 1955 Referring Provider (PT): Deland Pretty   Encounter Date: 12/31/2020    12/31/20 1453  PT Visits / Re-Eval  Visit Number 10  Number of Visits 16  Date for PT Re-Evaluation 01/07/21  Authorization  Authorization Type VA auth# [id number] (15 visits)  Authorization - Visit Number 10  Authorization - Number of Visits 15  PT Time Calculation  PT Start Time 2831  PT Stop Time 1530  PT Time Calculation (min) 41 min  PT - End of Session  Equipment Utilized During Treatment Gait belt  Activity Tolerance Patient tolerated treatment well (RLE spasms throughout session)  Behavior During Therapy [facility] for tasks assessed/performed    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   CONGESTIVE HEART FAILURE 11/12/2007   DIABETES MELLITUS, TYPE II 11/20/2008   HYPERLIPIDEMIA 11/12/2007   HYPERTENSION 11/12/2007   TACHYCARDIA 01/24/2010    Past Surgical History:  Procedure Laterality Date   BUBBLE STUDY  03/16/2020   Procedure: BUBBLE STUDY;  Surgeon: Buford Dresser, MD;  Location: [facility];  Service: Cardiovascular;;   LOOP RECORDER INSERTION N/A 03/23/2020   Procedure: LOOP RECORDER INSERTION;  Surgeon: Thompson Grayer, MD;  Location: [facility];  Service: Cardiovascular;  Laterality: N/A;   TEE WITHOUT CARDIOVERSION N/A 03/16/2020   Procedure: TRANSESOPHAGEAL ECHOCARDIOGRAM (TEE);  Surgeon: Buford Dresser, MD;  Location: [facility] ENDOSCOPY;  Service: Cardiovascular;  Laterality: N/A;   TONSILLECTOMY  1970    There were no vitals filed for this visit.    12/31/20 1452  Symptoms/Limitations  Subjective No new complaitns. No falls or pain to report. States he did some of  the ex's yesterday at home.  Limitations Standing;Sitting  How long can you stand comfortably? 5-10 minutes  Patient Stated Goals Open/close hand; Get more R LE motion;  Pain Assessment  Currently in Pain? No/denies  Pain Score 0      12/31/20 1454  Transfers  Transfers Sit to Stand;Stand to [employer] Transfers  Sit to Stand 3: Mod assist;2: Max assist;With upper extremity assist;From elevated surface;From bed;From chair/3-in-1  Sit to Stand Details Tactile cues for weight shifting;Verbal cues for sequencing;Verbal cues for technique;Verbal cues for safe use of DME/AE;Manual facilitation for weight shifting;Manual facilitation for placement;Manual facilitation for weight bearing;Verbal cues for precautions/safety  Sit to Stand Details (indicate cue type and reason) max assist of 2 person to stand from wheelchair to RW with cues on hand placement and weight shifting. one person blocking right foot to prevent flexion moment due to spasms with standing. then from elevated mat table to RW mod/max assist of one person to RW with facilitation for anterior weight shifting and continued blocking of right foot. decreased assistance needed as reps progressed with total of 3 reps performed.  Stand to Sit 3: Mod assist;2: Max assist  Stand to Sit Details (indicate cue type and reason) Tactile cues for weight shifting;Verbal cues for sequencing;Verbal cues for technique;Verbal cues for precautions/safety;Verbal cues for safe use of DME/AE;Manual facilitation for weight shifting;Manual facilitation for placement;Manual facilitation for weight bearing  Stand to Sit Details mod assist for controlled descent to elevated mat table with cues to reach back with left UE. max assist for controlled descent into lower wheelchair with continued cues to reach back with left UE.  Stand Pivot Transfers 4: Min assist;3: Mod assist  Stand Pivot Transfer Details (indicate cue type and reason) mod assist of 1 with second  therapist providing min assist for pivot steps from wheelchair to mat table toward left direction, cues/facilitation on posture, to advance feet and sequencing with RW. Assistance needed for right LE movement with pt able to self advance left LE. Then at end of session min assist of 2 person assist for pivot transfer toward right from mat table to wheelchair with less assistance needed for right foot advancement.  Therapeutic Activites   Therapeutic Activities Other Therapeutic Activities  Other Therapeutic Activities standing with RW next to mat table: working toward achieving tall posture each time, then working on forward/backward stepping with left foot first with right LE blocked, then moving the right foot forward/backward with assitance due to tone. min guard to min assist for balance at walker each time.  Exercises  Exercises Other Exercises  Other Exercises  seated at edge of mat working on right LE strengthening: long arc quads with assitance for 10 reps. then with foot on pillow case sliding foot backwards activly, then forward with yellow band assistance with PTA helping to stabilize foot on pillow case for 10 reps. occasional assistance needed for full knee flexion and controlled extension.  Knee/Hip Exercises: Aerobic  Other Aerobic Scift from wheelchair with rubber blocks used for wheelchair stability with bil LE's/left UE on level 1.5 x 6 minutes with goal >/= 40 steps per minute for strengtheing, full range of motion, increased right LE use and activity tolerance.         PT Long Term Goals - 12/29/20 1757       PT LONG TERM GOAL #1   Title Pt will be able to perform HEP with caregiver assist    Baseline updated HEP provided to pt and caregiver 12/29/2020 (not yet met)    Time 8    Period Weeks    Status On-going      PT LONG TERM GOAL #2   Title Pt will be able to perform all edge of bed scooting with CGA    Baseline Currently requiring min/mod A; 12/29/20:  mod/max assist  for scooting (not yet met)    Time 8    Period Weeks    Status On-going      PT LONG TERM GOAL #3   Title Pt will be able to maintain standing with min A for at least 5 minutes    Baseline Currently requiring mod A for ~30 sec; 12/29/20:  stands in [place] 3-4 minutes prior to needing sitting break (not yet met)    Time 8    Period Weeks    Status On-going      PT LONG TERM GOAL #4   Title Pt will be able to perform chair transfer with min A    Baseline Stand pivot t/fs requiring max A for safety; 12/29/20:  mod assist at best with set-up assistance using sliding board to L side (not yet met)    Time 8    Period Weeks    Status On-going      PT LONG TERM GOAL #5   Title FIST score will improve to at least 38/56 to demo [place] for improved sitting balance with decreased risk of falls    Baseline 33/56; 12/29/20:  43/56    Time 8    Period Weeks    Status Achieved  12/31/20 1453  Plan  Clinical Impression Statement Today's skilled session continued to focus on transfer training, sit<>stands with RW and pregait activities. Less overall assitance needed with transfers this session. The pt does continue to have right LE spasms/tone, improving from previous sessions. The pt should benefit from continued PT to progress toward unmet goals.  Personal Factors and Comorbidities Age;Time since onset of injury/illness/exacerbation  Examination-Activity Limitations Locomotion Level;Transfers;Sit;Stand;Toileting;Dressing;Hygiene/Grooming;Self Feeding;Bed Mobility;Bend;Bathing  Examination-Participation Restrictions Community Activity;Shop;Meal Prep;Yard Work  Pt will benefit from skilled therapeutic intervention in order to improve on the following deficits Abnormal gait;Difficulty walking;Impaired tone;Decreased range of motion;Decreased coordination;Decreased endurance;Impaired UE functional use;Decreased activity tolerance;Pain;Decreased balance;Decreased mobility;Decreased  strength;Impaired sensation;Postural dysfunction  Stability/Clinical Decision Making Evolving/Moderate complexity  Rehab Potential Fair  PT Frequency 2x / week  PT Duration 8 weeks  PT Treatment/Interventions ADLs/Self Care Home Management;Electrical Stimulation;DME Instruction;Gait training;Functional mobility training;Therapeutic activities;Therapeutic exercise;Balance training;Neuromuscular re-education;Manual techniques;Patient/family education;Orthotic Fit/Training;Wheelchair mobility training;Passive range of motion;Dry needling;Taping  PT Next Visit Plan Continue towards LTGs; check updates to HEP.  Include caregiver or son in education for sliding board and trasnfer training.  continue to work on [employer] transfers for decreased caregiver burden at home, lateral scooting to work on core/sequencing of movements, standing with RW with 2 person assist working on [employer] gait activities. (By 01/07/21, PT to formally assess LTGs and complete recert; need to discuss with pt/family requesting additional visits from [place] and scheduling)  PT Home Exercise Plan Forward lean x20 with caregiver; standing 3x daily for at least 30 sec with caregiver; [place] 12/29/20  Consulted and Agree with Plan of Care Patient;Family member/caregiver  Family Member Consulted Son         Patient will benefit from skilled therapeutic intervention in order to improve the following deficits and impairments:  Abnormal gait, Difficulty walking, Impaired tone, Decreased range of motion, Decreased coordination, Decreased endurance, Impaired UE functional use, Decreased activity tolerance, Pain, Decreased balance, Decreased mobility, Decreased strength, Impaired sensation, Postural dysfunction  Visit Diagnosis: Muscle weakness (generalized)  Abnormal posture  Unsteadiness on feet  Hemiplegia and hemiparesis following cerebral infarction affecting right dominant side [facility])     Problem List Patient Active  Problem List   Diagnosis Date Noted   Cerebrovascular accident (CVA) ([place])    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   Hypertensive urgency 03/12/2020   Acute on chronic systolic CHF (congestive heart failure) ([place]) 03/12/2020   AKI (acute kidney injury) ([place]) 03/12/2020   Fall at home, initial encounter 03/12/2020   Right hip pain 03/12/2020   Hypokalemia 03/12/2020   Coronary artery disease involving native coronary artery of native heart without angina pectoris 09/07/2014   Renal insufficiency 12/02/2012   Preventative health care 11/13/2010   Secondary cardiomyopathy ([place]) 01/24/2010   TACHYCARDIA 01/24/2010   Type 2 diabetes mellitus without complication, with long-term current use of insulin ([place]) 11/20/2008   Mixed hyperlipidemia 11/12/2007   Essential hypertension 11/12/2007   CONGESTIVE HEART FAILURE 11/12/2007   Willow Ora, PTA, [facility] [address], [place], [place] [address] [phone] 01/02/21, 6:14 PM   Name: Trevor Anderson MRN: [id number] Date of Birth: 01-24-1956

## 2021-01-03 ENCOUNTER — Encounter: Payer: Self-pay | Admitting: Physical Therapy

## 2021-01-03 ENCOUNTER — Other Ambulatory Visit: Payer: Self-pay

## 2021-01-03 ENCOUNTER — Ambulatory Visit: Payer: No Typology Code available for payment source | Admitting: Physical Therapy

## 2021-01-03 DIAGNOSIS — M6281 Muscle weakness (generalized): Secondary | ICD-10-CM | POA: Diagnosis not present

## 2021-01-03 DIAGNOSIS — R293 Abnormal posture: Secondary | ICD-10-CM

## 2021-01-03 DIAGNOSIS — R2681 Unsteadiness on feet: Secondary | ICD-10-CM

## 2021-01-04 NOTE — Therapy (Signed)
[facility] [address] [place], [place], [address] Phone: [phone]   Fax:  [phone]  Physical Therapy Treatment  Patient Details  Name: Trevor Anderson MRN: [id number] Date of Birth: February 14, 1956 Referring Provider (PT): Deland Pretty   Encounter Date: 01/03/2021   PT End of Session - 01/04/21 1809     Visit Number 11    Number of Visits 16    Date for PT Re-Evaluation 01/07/21    Authorization Type VA auth# [id number] (15 visits)    Authorization - Visit Number 11    Authorization - Number of Visits 15    PT Start Time 9323    PT Stop Time 5573    PT Time Calculation (min) 39 min    Equipment Utilized During Treatment Gait belt    Activity Tolerance Patient tolerated treatment well   RLE spasms throughout session   Behavior During Therapy [facility] for tasks assessed/performed             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   CONGESTIVE HEART FAILURE 11/12/2007   DIABETES MELLITUS, TYPE II 11/20/2008   HYPERLIPIDEMIA 11/12/2007   HYPERTENSION 11/12/2007   TACHYCARDIA 01/24/2010    Past Surgical History:  Procedure Laterality Date   BUBBLE STUDY  03/16/2020   Procedure: BUBBLE STUDY;  Surgeon: Buford Dresser, MD;  Location: [facility];  Service: Cardiovascular;;   LOOP RECORDER INSERTION N/A 03/23/2020   Procedure: LOOP RECORDER INSERTION;  Surgeon: Thompson Grayer, MD;  Location: [place] [facility];  Service: Cardiovascular;  Laterality: N/A;   TEE WITHOUT CARDIOVERSION N/A 03/16/2020   Procedure: TRANSESOPHAGEAL ECHOCARDIOGRAM (TEE);  Surgeon: Buford Dresser, MD;  Location: [facility] ENDOSCOPY;  Service: Cardiovascular;  Laterality: N/A;   TONSILLECTOMY  1970    There were no vitals filed for this visit.   Subjective Assessment - 01/03/21 1413     Subjective No changes.  Nothing new to report.    Limitations Standing;Sitting    How long can you  stand comfortably? 5-10 minutes    Patient Stated Goals Open/close hand; Get more R LE motion;    Currently in Pain? Yes    Pain Score 2     Pain Location Hip    Pain Orientation Right    Pain Descriptors / Indicators Aching;Sore    Pain Type Chronic pain    Pain Onset More than a month ago    Pain Frequency Intermittent    Aggravating Factors  certain movements    Pain Relieving Factors immobility                               [facility] Adult PT Treatment/Exercise - 01/03/21 1408       Transfers   Transfers Sit to Stand;Stand to [employer] Transfers    Sit to Stand 2: Max assist;With upper extremity assist;From elevated surface;From bed;From chair/3-in-1    Sit to Stand Details Tactile cues for weight shifting;Verbal cues for sequencing;Verbal cues for technique;Verbal cues for safe use of DME/AE;Manual facilitation for weight shifting;Manual facilitation for placement;Manual facilitation for weight bearing;Verbal cues for precautions/safety    Sit to Stand Details (indicate cue type and reason) Max assist of 1 for sit to stand from w/c to prepare for stand pivot transfer.  Max assist of 2 (caregiver is +2) for sit<>stand from mat to RW, assist provided to block RLE to prevent flexor synergy from bringing  RLE off the floor.  Performed sit to stand at RW x 3 reps    Stand to Sit 3: Mod assist;2: Max assist    Stand to Sit Details (indicate cue type and reason) Tactile cues for weight shifting;Verbal cues for sequencing;Verbal cues for technique;Verbal cues for precautions/safety;Verbal cues for safe use of DME/AE;Manual facilitation for weight shifting;Manual facilitation for placement;Manual facilitation for weight bearing    Stand Pivot Transfers 3: Mod assist;2: Max assist    Stand Pivot Transfer Details (indicate cue type and reason) SPT w/c to mat, going to L side, then mat>w/c going to L side, cues for hand placement and assist needed to block RLE in weightbearing  position.      Therapeutic Activites    Therapeutic Activities Other Therapeutic Activities    Other Therapeutic Activities Discussed with pt and caregiver present, pt's POC.  Caregiver reports pt participates actively in trasnfers and exercises sometimes at home, but other times gets frustrated and doesn't fully participate.  Discussed continued need for therapy and pt's slow progress towards goals; discussed VA visit limitation and pt/caregiver agreeable to pursuing request for additional visits beyond 15.  Caregiver does report that w/c process has been initiated and pt is to be receieving a custom w/c; what he has now is not his permanent w/c setup. Discussed benefits with patient of full, consistent participation with exercises at home to assist with funcitonal progress in therapy.      Exercises   Exercises Other Exercises    Other Exercises  seated in w/c, assisted w/c kick and digs on RLE to propel w/c with BLEs, with mod/max assist; forward/back rocking with RLE in weightbearing position, with manual assistance.      Knee/Hip Exercises: Aerobic   Other Aerobic Scift from wheelchair with rubber blocks used for wheelchair stability with bil LE's/left UE on level 1.5 x 6 minutes with goal >/= 40 steps per minute for strengtheing, full range of motion, increased right LE use and activity tolerance.  Initial assist to achieve/maintain above speed.                     PT Education - 01/04/21 1809     Education Details Discussed POC, plans for recert after this week    Person(s) Educated Patient;Caregiver(s)    Methods Explanation    Comprehension Verbalized understanding                 PT Long Term Goals - 01/04/21 1810       PT LONG TERM GOAL #1   Title Pt will be able to perform HEP with caregiver assist    Baseline updated HEP provided to pt and caregiver 12/29/2020 (not yet met)    Time 8    Period Weeks    Status On-going      PT LONG TERM GOAL #2   Title  Pt will be able to perform all edge of bed scooting with CGA    Baseline Currently requiring min/mod A; 12/29/20:  mod/max assist for scooting (not yet met); 01/03/2021 max assist scooting    Time 8    Period Weeks    Status Not Met      PT LONG TERM GOAL #3   Title Pt will be able to maintain standing with min A for at least 5 minutes    Baseline Currently requiring mod A for ~30 sec; 12/29/20:  stands in [place] 3-4 minutes prior to needing sitting break (not yet met)  Time 8    Period Weeks    Status On-going      PT LONG TERM GOAL #4   Title Pt will be able to perform chair transfer with min A    Baseline Stand pivot t/fs requiring max A for safety; 12/29/20:  mod assist at best with set-up assistance using sliding board to L side (not yet met); mod/max assist stand pivot transfer 01/04/2021    Time 8    Period Weeks    Status Not Met      PT LONG TERM GOAL #5   Title FIST score will improve to at least 38/56 to demo [place] for improved sitting balance with decreased risk of falls    Baseline 33/56; 12/29/20:  43/56    Time 8    Period Weeks    Status Achieved                   Plan - 01/04/21 1812     Clinical Impression Statement Continued to work on transfer training, sit<>stand with RW and use of aerobic machines for flexibility/trying to decrease spasms.  RLE continues to spasm and have increased tone in flexor synergy pattern, but pt able to assist with increased RLE weightbearing with transfers and standing today in PT session.  In looking at LTGs, LTG 2 and 4 for scooting and transfers with decreased assistance not yet met.  Caregiver reports pt intermittently participates with transfers and activities at home, including standing, but gets easily frustrates.  She reports that the w/c that he comes in to PT sessions is not his w/c and he has completed w/c evaluation and is awaiting his permanent, specific w/c.  He is making slow but steady progress towards goals and would  benefit from additional skilled PT to address strength, flexibility, transfers, standing tolerance for decreased caregiver burden and improved functional mobility.    Personal Factors and Comorbidities Age;Time since onset of injury/illness/exacerbation    Examination-Activity Limitations Locomotion Level;Transfers;Sit;Stand;Toileting;Dressing;Hygiene/Grooming;Self Feeding;Bed Mobility;Bend;Bathing    Examination-Participation Restrictions Community Activity;Shop;Meal Prep;Yard Work    [employer]chant [employer] officer Evolving/Moderate complexity    Rehab Potential Fair    PT Frequency 2x / week    PT Duration 8 weeks    PT Treatment/Interventions ADLs/Self Care Home Management;Electrical Stimulation;DME Instruction;Gait training;Functional mobility training;Therapeutic activities;Therapeutic exercise;Balance training;Neuromuscular re-education;Manual techniques;Patient/family education;Orthotic Fit/Training;Wheelchair mobility training;Passive range of motion;Dry needling;Taping    PT Next Visit Plan Assess remaining LTGs and send to PT for recert ( and PT to request additional VA visits)  Include caregiver or son in education for sliding board and trasnfer training.  continue to work on [employer] transfers for decreased caregiver burden at home, lateral scooting to work on core/sequencing of movements, standing with RW with 2 person assist working on [employer] gait activities. Consider trying standing frame (?) for more consistent standing time and RLE weightbearing    PT Home Exercise Plan Forward lean x20 with caregiver; standing 3x daily for at least 30 sec with caregiver; [place] 12/29/20    Consulted and Agree with Plan of Care Patient;Family member/caregiver    Family Member Consulted Son             Patient will benefit from skilled therapeutic intervention in order to improve the following deficits and impairments:  Abnormal gait, Difficulty walking, Impaired tone, Decreased range  of motion, Decreased coordination, Decreased endurance, Impaired UE functional use, Decreased activity tolerance, Pain, Decreased balance, Decreased mobility, Decreased strength, Impaired sensation, Postural dysfunction  Visit Diagnosis:  Muscle weakness (generalized)  Unsteadiness on feet  Abnormal posture     Problem List Patient Active Problem List   Diagnosis Date Noted   Cerebrovascular accident (CVA) ([place])    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   Hypertensive urgency 03/12/2020   Acute on chronic systolic CHF (congestive heart failure) ([place]) 03/12/2020   AKI (acute kidney injury) ([place]) 03/12/2020   Fall at home, initial encounter 03/12/2020   Right hip pain 03/12/2020   Hypokalemia 03/12/2020   Coronary artery disease involving native coronary artery of native heart without angina pectoris 09/07/2014   Renal insufficiency 12/02/2012   Preventative health care 11/13/2010   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 [place], [place], [address] Phone: [phone]   Fax:  [phone]  Name: Trevor Anderson MRN: [id number] Date of Birth: [birth year]

## 2021-01-06 ENCOUNTER — Ambulatory Visit: Payer: No Typology Code available for payment source | Admitting: Physical Therapy

## 2021-01-06 ENCOUNTER — Other Ambulatory Visit: Payer: Self-pay

## 2021-01-06 DIAGNOSIS — R293 Abnormal posture: Secondary | ICD-10-CM

## 2021-01-06 DIAGNOSIS — R2681 Unsteadiness on feet: Secondary | ICD-10-CM

## 2021-01-06 DIAGNOSIS — I69351 Hemiplegia and hemiparesis following cerebral infarction affecting right dominant side: Secondary | ICD-10-CM

## 2021-01-06 DIAGNOSIS — M6281 Muscle weakness (generalized): Secondary | ICD-10-CM | POA: Diagnosis not present

## 2021-01-09 NOTE — Therapy (Addendum)
[facility] [address] [place], [place], [address] Phone: [phone]   Fax:  [phone]  Physical Therapy Treatment/Recert Note  Patient Details  Name: Trevor Anderson MRN: [id number] Date of Birth: July 30, 1955 Referring Provider (PT): Deland Pretty   Encounter Date: 01/06/2021    01/06/21 1455  PT Visits / Re-Eval  Visit Number 12  Number of Visits 16  Date for PT Re-Evaluation 01/07/21  Authorization  Authorization Type VA auth# [id number] (15 visits)  Authorization - Visit Number 12  Authorization - Number of Visits 15  PT Time Calculation  PT Start Time 1450  PT Stop Time 1530  PT Time Calculation (min) 40 min  PT - End of Session  Equipment Utilized During Treatment Gait belt  Activity Tolerance Patient tolerated treatment well (RLE spasms throughout session)  Behavior During Therapy [facility] for tasks assessed/performed    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   CONGESTIVE HEART FAILURE 11/12/2007   DIABETES MELLITUS, TYPE II 11/20/2008   HYPERLIPIDEMIA 11/12/2007   HYPERTENSION 11/12/2007   TACHYCARDIA 01/24/2010    Past Surgical History:  Procedure Laterality Date   BUBBLE STUDY  03/16/2020   Procedure: BUBBLE STUDY;  Surgeon: Buford Dresser, MD;  Location: [facility];  Service: Cardiovascular;;   LOOP RECORDER INSERTION N/A 03/23/2020   Procedure: LOOP RECORDER INSERTION;  Surgeon: Thompson Grayer, MD;  Location: [facility];  Service: Cardiovascular;  Laterality: N/A;   TEE WITHOUT CARDIOVERSION N/A 03/16/2020   Procedure: TRANSESOPHAGEAL ECHOCARDIOGRAM (TEE);  Surgeon: Buford Dresser, MD;  Location: [facility] ENDOSCOPY;  Service: Cardiovascular;  Laterality: N/A;   TONSILLECTOMY  1970    There were no vitals filed for this visit.     01/06/21 1450  Symptoms/Limitations  Subjective Has a new wheelchair!!! No falls. Having some right hip  pain as usual.  Limitations Standing;Sitting  How long can you stand comfortably? 5-10 minutes  Patient Stated Goals Open/close hand; Get more R LE motion;  Pain Assessment  Currently in Pain? Yes  Pain Score 1  Pain Location Hip  Pain Descriptors / Indicators Aching;Sore  Pain Onset More than a month ago  Pain Frequency Intermittent  Aggravating Factors  certain movements, proloned immobility  Pain Relieving Factors stretching, resting     01/06/21 1456  Transfers  Transfers Sit to Stand;Stand to [employer] Transfers  Sit to Stand 2: Max assist;With upper extremity assist;From elevated surface;From bed;From chair/3-in-1  Sit to Stand Details Tactile cues for weight shifting;Verbal cues for sequencing;Verbal cues for technique;Verbal cues for safe use of DME/AE;Manual facilitation for weight shifting;Manual facilitation for placement;Manual facilitation for weight bearing;Verbal cues for precautions/safety  Sit to Stand Details (indicate cue type and reason) cues to scoot to edge of surface, for hand placement and for weight shifting to stand up  Stand to Sit 3: Mod assist;With upper extremity assist;To bed;To chair/3-in-1  Stand to Sit Details (indicate cue type and reason) Tactile cues for weight shifting;Verbal cues for sequencing;Verbal cues for technique;Verbal cues for precautions/safety;Verbal cues for safe use of DME/AE;Manual facilitation for weight shifting;Manual facilitation for placement;Manual facilitation for weight bearing  Stand to Sit Details cues to reach back and use UE to assist with controlled sitting after fully coming to surface prior to sitting.  Stand Pivot Transfers 3: Mod assist;2: Max assist  Stand Pivot Transfer Details (indicate cue type and reason) with use of RW: wheelchair<>mat table with assist for balance, walker management, stepping sequence and tall posture.  Therapeutic Activites   Therapeutic Activities Other Therapeutic Activities  Other  Therapeutic Activities standing at [place] next to mat table: working on tall posture, lateral weight shifting, and pre-gait activities such as forward/backward stepping, lateral stepping out/in. min to mod assist with standing activities performed.         PT Long Term Goals - 01/04/21 1810       PT LONG TERM GOAL #1   Title Pt will be able to perform HEP with caregiver assist    Baseline updated HEP provided to pt and caregiver 12/29/2020 (not yet met)    Time 8    Period Weeks    Status On-going      PT LONG TERM GOAL #2   Title Pt will be able to perform all edge of bed scooting with CGA    Baseline Currently requiring min/mod A; 12/29/20:  mod/max assist for scooting (not yet met); 01/03/2021 max assist scooting    Time 8    Period Weeks    Status Not Met      PT LONG TERM GOAL #3   Title Pt will be able to maintain standing with min A for at least 5 minutes    Baseline Currently requiring mod A for ~30 sec; 12/29/20:  stands in [place] 3-4 minutes prior to needing sitting break (not yet met)    Time 8    Period Weeks    Status On-going      PT LONG TERM GOAL #4   Title Pt will be able to perform chair transfer with min A    Baseline Stand pivot t/fs requiring max A for safety; 12/29/20:  mod assist at best with set-up assistance using sliding board to L side (not yet met); mod/max assist stand pivot transfer 01/04/2021    Time 8    Period Weeks    Status Not Met      PT LONG TERM GOAL #5   Title FIST score will improve to at least 38/56 to demo [place] for improved sitting balance with decreased risk of falls    Baseline 33/56; 12/29/20:  43/56    Time 8    Period Weeks    Status Achieved               01/06/21 2029  Plan  Clinical Impression Statement Today's skilled session focused on progress toward remaining LTGs with primary PT planning to recert. Pt partially to fully met goals checked today. The pt is progressing toward goals and should benefit from continued PT to  progress toward unmet goals.  Personal Factors and Comorbidities Age;Time since onset of injury/illness/exacerbation  Examination-Activity Limitations Locomotion Level;Transfers;Sit;Stand;Toileting;Dressing;Hygiene/Grooming;Self Feeding;Bed Mobility;Bend;Bathing  Examination-Participation Restrictions Community Activity;Shop;Meal Prep;Yard Work  Pt will benefit from skilled therapeutic intervention in order to improve on the following deficits Abnormal gait;Difficulty walking;Impaired tone;Decreased range of motion;Decreased coordination;Decreased endurance;Impaired UE functional use;Decreased activity tolerance;Pain;Decreased balance;Decreased mobility;Decreased strength;Impaired sensation;Postural dysfunction  Stability/Clinical Decision Making Evolving/Moderate complexity  Rehab Potential Fair  PT Frequency 2x / week  PT Duration 8 weeks  PT Treatment/Interventions ADLs/Self Care Home Management;Electrical Stimulation;DME Instruction;Gait training;Functional mobility training;Therapeutic activities;Therapeutic exercise;Balance training;Neuromuscular re-education;Manual techniques;Patient/family education;Orthotic Fit/Training;Wheelchair mobility training;Passive range of motion;Dry needling;Taping  PT Next Visit Plan Include caregiver or son in education for sliding board and trasnfer training.  continue to work on stand pivot transfers with RW, standing balance with RW, pre gait activities. LE stretching to assist with tone/spasticity management  PT Home Exercise Plan Forward lean x20 with caregiver; standing 3x  daily for at least 30 sec with caregiver; [employer] 12/29/20  Consulted and Agree with Plan of Care Patient;Family member/caregiver  Family Member Consulted Son         Patient will benefit from skilled therapeutic intervention in order to improve the following deficits and impairments:  Abnormal gait, Difficulty walking, Impaired tone, Decreased range of motion, Decreased  coordination, Decreased endurance, Impaired UE functional use, Decreased activity tolerance, Pain, Decreased balance, Decreased mobility, Decreased strength, Impaired sensation, Postural dysfunction  Visit Diagnosis: Muscle weakness (generalized)  Unsteadiness on feet  Abnormal posture  Hemiplegia and hemiparesis following cerebral infarction affecting right dominant side [facility])     Problem List Patient Active Problem List   Diagnosis Date Noted   Cerebrovascular accident (CVA) ([place])    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   Hypertensive urgency 03/12/2020   Acute on chronic systolic CHF (congestive heart failure) ([place]) 03/12/2020   AKI (acute kidney injury) ([place]) 03/12/2020   Fall at home, initial encounter 03/12/2020   Right hip pain 03/12/2020   Hypokalemia 03/12/2020   Coronary artery disease involving native coronary artery of native heart without angina pectoris 09/07/2014   Renal insufficiency 12/02/2012   Preventative health care 11/13/2010   Secondary cardiomyopathy ([place]) 01/24/2010   TACHYCARDIA 01/24/2010   Type 2 diabetes mellitus without complication, with long-term current use of insulin ([place]) 11/20/2008   Mixed hyperlipidemia 11/12/2007   Essential hypertension 11/12/2007   CONGESTIVE HEART FAILURE 11/12/2007   Willow Ora, PTA, [facility] [address] [place], [place] [address] [phone] 01/09/21, 8:30 PM   Name: Trevor Anderson MRN: [id number] Date of Birth: [birth year]  For Recert:   PT End of Session - 01/12/21 0743     Visit Number 12    Number of Visits 28    Date for PT Re-Evaluation 35/36/14   per recert after 4/31/54 visit   Authorization Type VA auth# [id number] (15 visits)    Authorization - Visit Number 12    Authorization - Number of Visits 15    Equipment Utilized During Treatment Gait belt    Activity Tolerance Patient tolerated treatment well   RLE spasms throughout session    Behavior During Therapy [facility] for tasks assessed/performed             PT Short Term Goals - 01/12/21 0729       PT SHORT TERM GOAL #1   Title Pt will perform HEP with caregiver assistance, for improved flexibility, strength, transfers.  TARGET 02/04/2021    Time 4    Period Weeks    Status New      PT SHORT TERM GOAL #2   Title Pt will perform sit to stand transfer with mod assist.    Baseline currently max assist with sit<>stand    Time 4    Period Weeks    Status New      PT SHORT TERM GOAL #3   Title Caregiver will verbalize improved stand pivot transfers from w/c<>mat/bed with mod assist (at least 50% of the time).    Baseline mod/max with sliding board, has just improved to mod assist in clinic 01/06/2021    Time 4    Period Weeks    Status New             PT Long Term Goals - 01/12/21 2671       PT LONG TERM GOAL #1   Title Pt will be able to perform progression of/finalized HEP  with caregiver assist,  TARGET 03/04/21    Baseline has initial HEP and will benefit from continued progression of HEP    Time 8    Period Weeks    Status Revised      PT LONG TERM GOAL #2   Title Pt will be able to perform all edge of bed scooting with min A for improved ease of transfers, decreased caregiver burden    Baseline 01/07/21: pt continues to need cues and up to min to mod  assist, improved from mod/max assist    Time 8    Period Weeks    Status Revised      PT LONG TERM GOAL #3   Title Caregiver will report standing at sink or RW at home, for at least 5 minutes, at least 5 days per week, with min guard>supervision, for improved standing tolerance for ADL participation.    Baseline 01/07/21: min assist in PT session    Time 8    Period Weeks    Status Revised      PT LONG TERM GOAL #4   Title Pt will be able to perform wheelchair transfer with min A for improved independence, decreased caregiver burden    Baseline 01/07/21: mod assist of one person with RW for stand  pivot transfers. improved from mod/max assist with slide board transfer    Time 8    Period Weeks    Status Revised      PT LONG TERM GOAL #5   Title Pt/family/caregiver will verbalize plans for continued community fitness upon d/c from PT to maximize gains made in therapy.    Time 8    Period Weeks    Status New               01/06/21 2029  Plan  Clinical Impression Statement Today's skilled session focused on progress toward remaining LTGs with primary PT planning to recert. Pt partially to fully met goals checked today. The pt is progressing toward goals and should benefit from continued PT to progress toward unmet goals. (Recert completed after 01/06/21 visit; pt is making slow, steady progress.  Pt received new w/c for home, and should improve ease of transfers.  See recert/new goals to continue to address strength, flexibility, balance, transfers, for improved mobility .)  Personal Factors and Comorbidities Age;Time since onset of injury/illness/exacerbation  Examination-Activity Limitations Locomotion Level;Transfers;Sit;Stand;Toileting;Dressing;Hygiene/Grooming;Self Feeding;Bed Mobility;Bend;Bathing  Examination-Participation Restrictions Community Activity;Shop;Meal Prep;Yard Work  Pt will benefit from skilled therapeutic intervention in order to improve on the following deficits Abnormal gait;Difficulty walking;Impaired tone;Decreased range of motion;Decreased coordination;Decreased endurance;Impaired UE functional use;Decreased activity tolerance;Pain;Decreased balance;Decreased mobility;Decreased strength;Impaired sensation;Postural dysfunction  Stability/Clinical Decision Making Evolving/Moderate complexity  Rehab Potential Fair  PT Frequency 2x / week  PT Duration 8 weeks (per recert after 7/62/83 visit)  PT Treatment/Interventions ADLs/Self Care Home Management;Electrical Stimulation;DME Instruction;Gait training;Functional mobility training;Therapeutic  activities;Therapeutic exercise;Balance training;Neuromuscular re-education;Manual techniques;Patient/family education;Orthotic Fit/Training;Wheelchair mobility training;Passive range of motion;Dry needling;Taping  PT Next Visit Plan Include caregiver or son in education for sliding board and trasnfer training.  continue to work on stand pivot transfers with RW, standing balance with RW, pre gait activities. LE stretching to assist with tone/spasticity management (PT to request additional VA auth)  PT Home Exercise Plan Forward lean x20 with caregiver; standing 3x daily for at least 30 sec with caregiver; [place] 12/29/20  Consulted and Agree with Plan of Care Patient;Family member/caregiver  Family Member Consulted Son    Mady Haagensen, [place] 01/12/21 7:51 AM Phone:  [phone] Fax: [phone]

## 2021-01-12 ENCOUNTER — Other Ambulatory Visit: Payer: Self-pay

## 2021-01-12 ENCOUNTER — Ambulatory Visit: Payer: No Typology Code available for payment source | Admitting: Physical Therapy

## 2021-01-12 ENCOUNTER — Ambulatory Visit: Payer: No Typology Code available for payment source | Admitting: Occupational Therapy

## 2021-01-12 ENCOUNTER — Encounter: Payer: Self-pay | Admitting: Occupational Therapy

## 2021-01-12 DIAGNOSIS — R41840 Attention and concentration deficit: Secondary | ICD-10-CM

## 2021-01-12 DIAGNOSIS — M25621 Stiffness of right elbow, not elsewhere classified: Secondary | ICD-10-CM

## 2021-01-12 DIAGNOSIS — I69351 Hemiplegia and hemiparesis following cerebral infarction affecting right dominant side: Secondary | ICD-10-CM

## 2021-01-12 DIAGNOSIS — M6281 Muscle weakness (generalized): Secondary | ICD-10-CM

## 2021-01-12 DIAGNOSIS — M25611 Stiffness of right shoulder, not elsewhere classified: Secondary | ICD-10-CM

## 2021-01-12 DIAGNOSIS — R2681 Unsteadiness on feet: Secondary | ICD-10-CM

## 2021-01-12 DIAGNOSIS — R278 Other lack of coordination: Secondary | ICD-10-CM

## 2021-01-12 DIAGNOSIS — R293 Abnormal posture: Secondary | ICD-10-CM

## 2021-01-12 NOTE — Addendum Note (Signed)
Addended by: Gean Maidens on: 01/12/2021 07:53 AM   Modules accepted: Orders

## 2021-01-12 NOTE — Therapy (Signed)
[facility] [address] [place], [place], [address] Phone: [phone]   Fax:  [phone]  Occupational Therapy Evaluation  Patient Details  Name: Trevor Anderson MRN: [id number] Date of Birth: 1955-07-15 Referring Provider (OT): Noel Gerold, NP   Encounter Date: 01/12/2021   OT End of Session - 01/12/21 1441     Visit Number 1    Number of Visits 15    Date for OT Re-Evaluation 03/23/21    Authorization Type [place]    Authorization Time Period 15 visits approved 01/12/21 - 04/27/21    Authorization - Visit Number 1    Authorization - Number of Visits 15    OT Start Time 1530    OT Stop Time 1615    OT Time Calculation (min) 45 min    Activity Tolerance Patient tolerated treatment well    Behavior During Therapy [facility] for tasks assessed/performed             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   CONGESTIVE HEART FAILURE 11/12/2007   DIABETES MELLITUS, TYPE II 11/20/2008   HYPERLIPIDEMIA 11/12/2007   HYPERTENSION 11/12/2007   TACHYCARDIA 01/24/2010    Past Surgical History:  Procedure Laterality Date   BUBBLE STUDY  03/16/2020   Procedure: BUBBLE STUDY;  Surgeon: Jodelle Red, MD;  Location: [facility] ENDOSCOPY;  Service: Cardiovascular;;   LOOP RECORDER INSERTION N/A 03/23/2020   Procedure: LOOP RECORDER INSERTION;  Surgeon: Hillis Range, MD;  Location: [facility] INVASIVE CV LAB;  Service: Cardiovascular;  Laterality: N/A;   TEE WITHOUT CARDIOVERSION N/A 03/16/2020   Procedure: TRANSESOPHAGEAL ECHOCARDIOGRAM (TEE);  Surgeon: Jodelle Red, MD;  Location: [facility] ENDOSCOPY;  Service: Cardiovascular;  Laterality: N/A;   TONSILLECTOMY  1970    There were no vitals filed for this visit.   Subjective Assessment - 01/12/21 1509     Subjective  Pt is a 65 year old male that presents to Neuro OPOT s/p stroke in November 2021. Pt received inpatient rehab and finished with  HHOT and HHPT in July 2022. Pt is currently in OPPT at this clinic since July 2022. Pt was independent with all ADLs and IADLs prior to CVA in Nov 2021. Pt is currently living with his son and receiving assistance from a personal aide approx 7 hrs a day. Pt reports primary concerns with R side and not moving. Pt reports goal as "to move it" regarding RUE.    Patient is accompanied by: --   personal care attendant at conclusion of eval   Pertinent History HLD, HTN, DM, CHF    Limitations Fall Risk. Mod stand pivot transfer w RW    Patient Stated Goals to move it (RUE)    Currently in Pain? No/denies               [facility] OT Assessment - 01/12/21 1444       Assessment   Medical Diagnosis CVA affecting R side    Referring Provider (OT) Noel Gerold, NP    Onset Date/Surgical Date --   November 2021   Hand Dominance Right      Precautions   Precautions Fall      Restrictions   Weight Bearing Restrictions No      Balance Screen   Has the patient fallen in the past 6 months No      Home  Environment   Family/patient expects to be discharged to: Private residence    Living Arrangements Children  Available Help at Discharge Personal care attendant   lives with son and he helps when he is not working - CNA helps when at work   Type of [employer] entrance    Bathroom Shower/Tub Other (comment)   only completing sponge baths currently   Home Equipment Tub bench;Bedside commode;Wheelchair - Teacher, music in Space w/c   Lives With Son      Prior Function   Level of Independence Independent    Vocation Retired    Psychologist, forensic, basketball, watch sports    Comments Has aide 7 hrs/ day      ADL   Eating/Feeding Needs assist with cutting food    Grooming Moderate assistance   needs assistance for cutting hair and shaving   Upper Body Bathing Moderate assistance    Lower Body Bathing Moderate assistance    Upper Body Dressing Moderate assistance    Lower  Body Dressing Maximal assistance    Toilet Transfer Maximal assistance   currently urinating in brief and BSC for bowel movements   Toilet Transfer Method Stand pivot    Clinical biochemist Manipulation + 1 Total assistance    Toileting -  Hygiene Maximal assistance    Tub/Shower Transfer --   currently not performing in shower, sponge baths only     IADL   Shopping Completely unable to shop    Light Housekeeping Does not participate in any housekeeping tasks    Meal Prep Does not utilize stove or oven   pt reports propelling w/c to kitchen to get light snacks or beverages if necessary   Community Mobility Relies on family or friends for transportation    Medication Management Has difficulty remembering to take medication    Financial Management Dependent      Mobility   Mobility Status Comments in tilt in space w/c      Written Expression   Dominant Hand Right      Vision - History   Baseline Vision No visual deficits    Additional Comments pt denies any changes in vision      Observation/Other Assessments   Focus on Therapeutic Outcomes (FOTO)  not captured at evaluation      Sensation   Light Touch Appears Intact      Coordination   Coordination RUE with no functional movement or coordination      Perception   Perception Within Functional Limits   86% tabletop scanning     Tone   Assessment Location Right Upper Extremity      ROM / Strength   AROM / PROM / Strength PROM;AROM      AROM   Overall AROM Comments RUE trace activation noted at elbow extension and maybe eelbow flexion with trace at hand/finger extension      PROM   Overall PROM  Deficits    Overall PROM Comments LUE WFL, RUE deficits    PROM Assessment Site Shoulder;Elbow;Wrist    Right/Left Shoulder Right    Right Shoulder Flexion 80 Degrees    Right/Left Elbow Right    Right Elbow Extension -40    Right/Left Wrist Right    Right Wrist Extension 0  Degrees    Right Wrist Flexion 60 Degrees      Hand Function   Right Hand Gross Grasp Impaired   trace activation for extension   Left Hand Gross Grasp Functional  RUE Tone   RUE Tone Modified Ashworth;Severe;Hypertonic      RUE Tone   Modified Ashworth Scale for Grading Hypertonia RUE Slight increase in muscle tone, manifested by a catch, followed by minimal resistance throughout the remainder (less than half) of the ROM                                OT Short Term Goals - 01/12/21 1646       OT SHORT TERM GOAL #1   Title Pt and caregivers will be independent with HEP    Time 4    Period Weeks    Status New    Target Date 02/09/21      OT SHORT TERM GOAL #2   Title Pt will complete UB dressing with min A for pull over short sleeve shirt.    Baseline mod A    Time 4    Period Weeks    Status New      OT SHORT TERM GOAL #3   Title Pt and caregiver will verbalize understanding of adapted strategies and/or equipment for completing toilet and tub transfers with mod A consistently.    Baseline currently mod/max and not getting to shower.    Time 4    Period Weeks    Status New      OT SHORT TERM GOAL #4   Title Pt will achieve 90* of PROM for RUE shoulder flexion to reduce risk of malpositioning and increased pain.    Time 4    Period Weeks    Status New      OT SHORT TERM GOAL #5   Title Pt and caregivers will verbalize and demonstrate understanding of wear and care of any splints and/or orthoses PRN.    Time 4    Period Weeks    Status New               OT Long Term Goals - 01/12/21 1649       OT LONG TERM GOAL #1   Title Pt and caregivers will be independent with and updated HEPs    Time 10    Period Weeks    Status New    Target Date 03/23/21      OT LONG TERM GOAL #2   Title Pt will complete toilet and tub transfers consistently with min assistance with adapted strategies and equipment PRN    Baseline mod A for stand  pivot    Time 10    Period Weeks    Status New      OT LONG TERM GOAL #3   Title Pt will complete bathing with min A    Baseline mod-max    Time 10    Period Weeks    Status New      OT LONG TERM GOAL #4   Title Pt will demonstrate composite flexion/extension of 15% or greater in RUE for preparing for active grasp/release and increasing functional use.    Baseline trace extension    Time 10    Period Weeks    Status New      OT LONG TERM GOAL #5   Title Pt will achieve -20 degrees of elbow extension in RUE of PROM or decreasing risk of skin breakdown    Baseline -40*    Time 10    Period Weeks    Status New  Plan - 01/12/21 1517     Clinical Impression Statement Pt is a 65 year old male that presents to Neuro OPOT. Pt with PMH significant for HLD, HTN, DM, CHF. Pt presents with significnt RUE spasticity and limited ROM and hemiparesis. Pt is currently requiring moderate to maximal assitsance for transfers and is not able to get in shower per report. Skilled occupational thearpy is recomended to target listed areas of deficit and increase independence and decrease caregiver burden upon discharge.    OT Occupational Profile and History Problem Focused Assessment - Including review of records relating to presenting problem    Occupational performance deficits (Please refer to evaluation for details): IADL's;ADL's;Leisure    Body Structure / Function / Physical Skills Tone;Strength;ADL;FMC;Mobility;GMC;ROM;IADL;UE functional use;Decreased knowledge of use of DME    Cognitive Skills Problem Solve    Rehab Potential Good    Clinical Decision Making Limited treatment options, no task modification necessary    Comorbidities Affecting Occupational Performance: None    Modification or Assistance to Complete Evaluation  No modification of tasks or assist necessary to complete eval    OT Frequency 2x / week    OT Duration Other (comment)   15 visits over 10 weeks  d/t any scheduling conflicts.   OT Treatment/Interventions Self-care/ADL training;Manual Therapy;Patient/family education;Electrical Stimulation;Neuromuscular education;Functional Mobility Training;Passive range of motion;Cognitive remediation/compensation;Therapeutic exercise;Moist Heat;DME and/or AE instruction;Therapeutic activities;Aquatic Therapy;Splinting    Plan initiate self PROM, check splint if brought in, ask son how bathroom set up is and getting new chair in and out of bathroom    Consulted and Agree with Plan of Care Patient             Patient will benefit from skilled therapeutic intervention in order to improve the following deficits and impairments:   Body Structure / Function / Physical Skills: Tone, Strength, ADL, FMC, Mobility, GMC, ROM, IADL, UE functional use, Decreased knowledge of use of DME Cognitive Skills: Problem Solve     Visit Diagnosis: Muscle weakness (generalized)  Unsteadiness on feet  Hemiplegia and hemiparesis following cerebral infarction affecting right dominant side (HCC)  Other lack of coordination  Stiffness of right shoulder, not elsewhere classified  Stiffness of right elbow, not elsewhere classified  Attention and concentration deficit    Problem List Patient Active Problem List   Diagnosis Date Noted   Cerebrovascular accident (CVA) (HCC)    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   Hypertensive urgency 03/12/2020   Acute on chronic systolic CHF (congestive heart failure) (HCC) 03/12/2020   AKI (acute kidney injury) (HCC) 03/12/2020   Fall at home, initial encounter 03/12/2020   Right hip pain 03/12/2020   Hypokalemia 03/12/2020   Coronary artery disease involving native coronary artery of native heart without angina pectoris 09/07/2014   Renal insufficiency 12/02/2012   Preventative health care 11/13/2010   Secondary cardiomyopathy (HCC) 01/24/2010   TACHYCARDIA 01/24/2010   Type 2 diabetes mellitus  without complication, with long-term current use of insulin (HCC) 11/20/2008   Mixed hyperlipidemia 11/12/2007   Essential hypertension 11/12/2007   CONGESTIVE HEART FAILURE 11/12/2007    Junious Dresser, OT/L 01/12/2021, 4:53 PM  [facility] [address] [place], [place], [address] Phone: [phone]   Fax:  [phone]  Name: Trevor Anderson MRN: [id number] Date of Birth: [birth year]

## 2021-01-13 NOTE — Therapy (Signed)
[facility] [address] [place], [place], [address] Phone: [phone]   Fax:  [phone]  Physical Therapy Treatment  Patient Details  Name: Trevor Anderson MRN: [id number] Date of Birth: 08-Oct-1955 Referring Provider (PT): Noel Gerold   Encounter Date: 01/12/2021   PT End of Session - 01/12/21 1451     Visit Number 13    Number of Visits 28    Date for PT Re-Evaluation 03/04/21   per recert after 01/06/21 visit   Authorization Type VA auth# [id number] (15 visits)    Authorization - Visit Number 13    Authorization - Number of Visits 15    PT Start Time 1447    PT Stop Time 1530    PT Time Calculation (min) 43 min    Equipment Utilized During Treatment Gait belt    Activity Tolerance Patient tolerated treatment well   RLE spasms throughout session   Behavior During Therapy [facility] for tasks assessed/performed             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   CONGESTIVE HEART FAILURE 11/12/2007   DIABETES MELLITUS, TYPE II 11/20/2008   HYPERLIPIDEMIA 11/12/2007   HYPERTENSION 11/12/2007   TACHYCARDIA 01/24/2010    Past Surgical History:  Procedure Laterality Date   BUBBLE STUDY  03/16/2020   Procedure: BUBBLE STUDY;  Surgeon: Jodelle Red, MD;  Location: [facility] ENDOSCOPY;  Service: Cardiovascular;;   LOOP RECORDER INSERTION N/A 03/23/2020   Procedure: LOOP RECORDER INSERTION;  Surgeon: Hillis Range, MD;  Location: [facility] INVASIVE CV LAB;  Service: Cardiovascular;  Laterality: N/A;   TEE WITHOUT CARDIOVERSION N/A 03/16/2020   Procedure: TRANSESOPHAGEAL ECHOCARDIOGRAM (TEE);  Surgeon: Jodelle Red, MD;  Location: [facility] ENDOSCOPY;  Service: Cardiovascular;  Laterality: N/A;   TONSILLECTOMY  1970    There were no vitals filed for this visit.   Subjective Assessment - 01/12/21 1450     Subjective No new complaitns. No falls. No pain at this time. "Love it"  referring to his wheelchair.    Limitations Standing    How long can you stand comfortably? 5-10 minutes    Patient Stated Goals Open/close hand; Get more R LE motion;    Currently in Pain? No/denies    Pain Score 0-No pain                    [facility] Adult PT Treatment/Exercise - 01/12/21 1452       Transfers   Transfers Sit to Stand;Stand to [employer] Transfers    Sit to Stand 2: Max assist;3: Mod assist;From elevated surface;With upper extremity assist;From bed;From chair/3-in-1    Sit to Stand Details Tactile cues for weight shifting;Verbal cues for sequencing;Verbal cues for technique;Verbal cues for safe use of DME/AE;Manual facilitation for weight shifting;Manual facilitation for placement;Manual facilitation for weight bearing;Verbal cues for precautions/safety    Sit to Stand Details (indicate cue type and reason) cues to scoot to edge of surface, for hand and LE placement, and weight shifting each time. less assistance needed from mat table vs wheelchair.    Stand to Sit 3: Mod assist;2: Max assist;With upper extremity assist;To elevated surface;To bed;To chair/3-in-1    Stand to Sit Details (indicate cue type and reason) Tactile cues for weight shifting;Verbal cues for sequencing;Verbal cues for technique;Verbal cues for precautions/safety;Verbal cues for safe use of DME/AE;Manual facilitation for weight shifting;Manual facilitation for placement;Manual facilitation for weight bearing    Stand to Sit Details  cues to reach back with left UE for controlled descent to sit each time.    Stand Pivot Transfers 3: Mod assist;2: Max assist   with 2cd person min guard  for safety   Stand Pivot Transfer Details (indicate cue type and reason) use of RW for wheelchair<>mat table. cues/facilitaion for posture, sequencing, step placement and walker management with transfers.      Neuro Re-ed    Neuro Re-ed Details  for balance/muscle re-ed: standing at RW working on tall posture,  lateral weight shifting, progressing to LE forward/backward stepping for 5-6 reps each side with each stand. Performed x 4 stands at edge of mat with min to mod assist for standing balance with cues on posture, weight shifting.      Knee/Hip Exercises: Aerobic   Other Aerobic Scift from wheelchair with rubber blocks used for wheelchair stability with bil LE's/left UE on level 2.0 x 8 minutes with goal >/= 40 steps per minute for strengtheing, full range of motion, increased right LE use and activity tolerance. Cues to maintain speed, no assist needed.                       PT Short Term Goals - 01/12/21 0729       PT SHORT TERM GOAL #1   Title Pt will perform HEP with caregiver assistance, for improved flexibility, strength, transfers.  TARGET 02/04/2021    Time 4    Period Weeks    Status New      PT SHORT TERM GOAL #2   Title Pt will perform sit to stand transfer with mod assist.    Baseline currently max assist with sit<>stand    Time 4    Period Weeks    Status New      PT SHORT TERM GOAL #3   Title Caregiver will verbalize improved stand pivot transfers from w/c<>mat/bed with mod assist (at least 50% of the time).    Baseline mod/max with sliding board, has just improved to mod assist in clinic 01/06/2021    Time 4    Period Weeks    Status New               PT Long Term Goals - 01/12/21 0738       PT LONG TERM GOAL #1   Title Pt will be able to perform progression of/finalized HEP with caregiver assist,  TARGET 03/04/21    Baseline has initial HEP and will benefit from continued progression of HEP    Time 8    Period Weeks    Status Revised      PT LONG TERM GOAL #2   Title Pt will be able to perform all edge of bed scooting with min A for improved ease of transfers, decreased caregiver burden    Baseline 01/07/21: pt continues to need cues and up to min to mod  assist, improved from mod/max assist    Time 8    Period Weeks    Status Revised       PT LONG TERM GOAL #3   Title Caregiver will report standing at sink or RW at home, for at least 5 minutes, at least 5 days per week, with min guard>supervision, for improved standing tolerance for ADL participation.    Baseline 01/07/21: min assist in PT session    Time 8    Period Weeks    Status Revised      PT LONG TERM GOAL #4  Title Pt will be able to perform wheelchair transfer with min A for improved independence, decreased caregiver burden    Baseline 01/07/21: mod assist of one person with RW for stand pivot transfers. improved from mod/max assist with slide board transfer    Time 8    Period Weeks    Status Revised      PT LONG TERM GOAL #5   Title Pt/family/caregiver will verbalize plans for continued community fitness upon d/c from PT to maximize gains made in therapy.    Time 8    Period Weeks    Status New                   Plan - 01/12/21 1452     Clinical Impression Statement Today's skilled session continued to focus on strengthening, transfers and pre gait activites with rest breaks taken as needed. No other issues noted or reported in session. The pt is progressing toward goals and should benefit from continued PT to progress toward unmet goals.    Personal Factors and Comorbidities Age;Time since onset of injury/illness/exacerbation    Examination-Activity Limitations Locomotion Level;Transfers;Sit;Stand;Toileting;Dressing;Hygiene/Grooming;Self Feeding;Bed Mobility;Bend;Bathing    Examination-Participation Restrictions Community Activity;Shop;Meal Prep;Yard Work    Stability/Clinical Decision Making Evolving/Moderate complexity    Rehab Potential Fair    PT Frequency 2x / week    PT Duration 8 weeks   per recert after 01/06/21 visit   PT Treatment/Interventions ADLs/Self Care Home Management;Electrical Stimulation;DME Instruction;Gait training;Functional mobility training;Therapeutic activities;Therapeutic exercise;Balance training;Neuromuscular  re-education;Manual techniques;Patient/family education;Orthotic Fit/Training;Wheelchair mobility training;Passive range of motion;Dry needling;Taping    PT Next Visit Plan Include caregiver or son in education for sliding board and transfer training.  continue to work on stand pivot transfers with RW, standing balance with RW, pre gait activities. LE stretching to assist with tone/spasticity management   PT to request additional VA auth   PT Home Exercise Plan Forward lean x20 with caregiver; standing 3x daily for at least 30 sec with caregiver; [employer] 12/29/20    Consulted and Agree with Plan of Care Patient;Family member/caregiver    Family Member Consulted Son             Patient will benefit from skilled therapeutic intervention in order to improve the following deficits and impairments:  Abnormal gait, Difficulty walking, Impaired tone, Decreased range of motion, Decreased coordination, Decreased endurance, Impaired UE functional use, Decreased activity tolerance, Pain, Decreased balance, Decreased mobility, Decreased strength, Impaired sensation, Postural dysfunction  Visit Diagnosis: Muscle weakness (generalized)  Unsteadiness on feet  Abnormal posture  Hemiplegia and hemiparesis following cerebral infarction affecting right dominant side [facility])     Problem List Patient Active Problem List   Diagnosis Date Noted   Cerebrovascular accident (CVA) (HCC)    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   Hypertensive urgency 03/12/2020   Acute on chronic systolic CHF (congestive heart failure) (HCC) 03/12/2020   AKI (acute kidney injury) (HCC) 03/12/2020   Fall at home, initial encounter 03/12/2020   Right hip pain 03/12/2020   Hypokalemia 03/12/2020   Coronary artery disease involving native coronary artery of native heart without angina pectoris 09/07/2014   Renal insufficiency 12/02/2012   Preventative health care 11/13/2010   Secondary cardiomyopathy (HCC)  01/24/2010   TACHYCARDIA 01/24/2010   Type 2 diabetes mellitus without complication, with long-term current use of insulin (HCC) 11/20/2008   Mixed hyperlipidemia 11/12/2007   Essential hypertension 11/12/2007   CONGESTIVE HEART FAILURE 11/12/2007    Sallyanne Kuster, PTA 01/13/2021, 9:47 PM  [facility] [address] [place], [place], [address] Phone: [phone]   Fax:  [phone]  Name: Trevor Anderson MRN: [id number] Date of Birth: [birth year]

## 2021-01-14 ENCOUNTER — Ambulatory Visit: Payer: No Typology Code available for payment source | Admitting: Physical Therapy

## 2021-01-14 ENCOUNTER — Ambulatory Visit: Payer: No Typology Code available for payment source | Admitting: Occupational Therapy

## 2021-01-14 ENCOUNTER — Other Ambulatory Visit: Payer: Self-pay

## 2021-01-14 ENCOUNTER — Encounter: Payer: Self-pay | Admitting: Occupational Therapy

## 2021-01-14 DIAGNOSIS — R2681 Unsteadiness on feet: Secondary | ICD-10-CM

## 2021-01-14 DIAGNOSIS — I69351 Hemiplegia and hemiparesis following cerebral infarction affecting right dominant side: Secondary | ICD-10-CM

## 2021-01-14 DIAGNOSIS — M6281 Muscle weakness (generalized): Secondary | ICD-10-CM | POA: Diagnosis not present

## 2021-01-14 DIAGNOSIS — M25621 Stiffness of right elbow, not elsewhere classified: Secondary | ICD-10-CM

## 2021-01-14 DIAGNOSIS — R41840 Attention and concentration deficit: Secondary | ICD-10-CM

## 2021-01-14 DIAGNOSIS — R278 Other lack of coordination: Secondary | ICD-10-CM

## 2021-01-14 DIAGNOSIS — M25611 Stiffness of right shoulder, not elsewhere classified: Secondary | ICD-10-CM

## 2021-01-14 NOTE — Therapy (Signed)
[facility] [address] [place], [place], [address] Phone: [phone]   Fax:  [phone]  Physical Therapy Treatment  Patient Details  Name: Trevor Anderson MRN: [id number] Date of Birth: 1955-09-19 Referring Provider (PT): Noel Gerold   Encounter Date: 01/14/2021   PT End of Session - 01/14/21 0809     Visit Number 14    Number of Visits 28    Date for PT Re-Evaluation 03/04/21   per recert after 01/06/21 visit   Authorization Type VA auth# [id number] (15 visits)    Authorization - Visit Number 14    Authorization - Number of Visits 15    PT Start Time 0806    PT Stop Time 0845    PT Time Calculation (min) 39 min    Equipment Utilized During Treatment Gait belt    Activity Tolerance Patient tolerated treatment well   RLE spasms throughout session   Behavior During Therapy [facility] for tasks assessed/performed             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   CONGESTIVE HEART FAILURE 11/12/2007   DIABETES MELLITUS, TYPE II 11/20/2008   HYPERLIPIDEMIA 11/12/2007   HYPERTENSION 11/12/2007   TACHYCARDIA 01/24/2010    Past Surgical History:  Procedure Laterality Date   BUBBLE STUDY  03/16/2020   Procedure: BUBBLE STUDY;  Surgeon: Jodelle Red, MD;  Location: [facility] ENDOSCOPY;  Service: Cardiovascular;;   LOOP RECORDER INSERTION N/A 03/23/2020   Procedure: LOOP RECORDER INSERTION;  Surgeon: Hillis Range, MD;  [facility]: [facility] INVASIVE CV LAB;  Service: Cardiovascular;  Laterality: N/A;   TEE WITHOUT CARDIOVERSION N/A 03/16/2020   Procedure: TRANSESOPHAGEAL ECHOCARDIOGRAM (TEE);  Surgeon: Jodelle Red, MD;  Location: [facility] ENDOSCOPY;  Service: Cardiovascular;  Laterality: N/A;   TONSILLECTOMY  1970    There were no vitals filed for this visit.   Subjective Assessment - 01/14/21 0808     Subjective No new complaints. No falls or pain to report.     Limitations Standing    How long can you stand comfortably? 5-10 minutes    Patient Stated Goals Open/close hand; Get more R LE motion;    Currently in Pain? No/denies    Pain Score 0-No pain                    [facility] Adult PT Treatment/Exercise - 01/14/21 0810       Transfers   Transfers Sit to Stand;Stand to Sit;Stand Pivot Transfers    Sit to Stand 2: Max assist;3: Mod assist;From elevated surface;With upper extremity assist;From bed;From chair/3-in-1    Sit to Stand Details Tactile cues for weight shifting;Verbal cues for sequencing;Verbal cues for technique;Verbal cues for safe use of DME/AE;Manual facilitation for weight shifting;Manual facilitation for placement;Manual facilitation for weight bearing;Verbal cues for precautions/safety    Sit to Stand Details (indicate cue type and reason) increased assist to stand from wheelchair due to increased right LE tone/spasticity this morning. This improved after ex's/stretching/standing as session progressed to mod assist to stand from elevated mat table.    Stand to Sit 3: Mod assist;2: Max assist;With upper extremity assist;To elevated surface;To bed;To chair/3-in-1    Stand to Sit Details (indicate cue type and reason) Tactile cues for weight shifting;Verbal cues for sequencing;Verbal cues for technique;Verbal cues for precautions/safety;Verbal cues for safe use of DME/AE;Manual facilitation for weight shifting;Manual facilitation for placement;Manual facilitation for weight bearing    Stand to Sit Details cues to  reach back for controlled descent with sitting down with assist needed to control descent.      Neuro Re-ed    Neuro Re-ed Details  for balance/muscle re-ed: standing at RW working on tall posture, lateral weight shifting, progressing to LE forward/backward stepping for 5-6 reps each side with each stand. Performed x 3 stands at edge of mat with min to mod assist for standing balance with cues on posture, weight shifting.       Exercises   Exercises Other Exercises    Other Exercises  seated at edge of mat table: passive right hamstring/heel cord stretching for 30 sec's x 3 reps; then with foot on pillow case working on sliding foot for knee extension/flexion with assist for 10 reps. ex's performed prior to standing activiites to decrease tone/tighness.      Knee/Hip Exercises: Aerobic   Other Aerobic Scift from wheelchair with rubber blocks used for wheelchair stability with bil LE's/left UE on level 2.0 x 8 minutes with goal >/= 40 steps per minute for strengtheing, full range of motion, increased right LE use and activity tolerance. Cues to maintain speed, no assist needed.                  PT Short Term Goals - 01/12/21 0729       PT SHORT TERM GOAL #1   Title Pt will perform HEP with caregiver assistance, for improved flexibility, strength, transfers.  TARGET 02/04/2021    Time 4    Period Weeks    Status New      PT SHORT TERM GOAL #2   Title Pt will perform sit to stand transfer with mod assist.    Baseline currently max assist with sit<>stand    Time 4    Period Weeks    Status New      PT SHORT TERM GOAL #3   Title Caregiver will verbalize improved stand pivot transfers from w/c<>mat/bed with mod assist (at least 50% of the time).    Baseline mod/max with sliding board, has just improved to mod assist in clinic 01/06/2021    Time 4    Period Weeks    Status New               PT Long Term Goals - 01/12/21 0738       PT LONG TERM GOAL #1   Title Pt will be able to perform progression of/finalized HEP with caregiver assist,  TARGET 03/04/21    Baseline has initial HEP and will benefit from continued progression of HEP    Time 8    Period Weeks    Status Revised      PT LONG TERM GOAL #2   Title Pt will be able to perform all edge of bed scooting with min A for improved ease of transfers, decreased caregiver burden    Baseline 01/07/21: pt continues to need cues and up to min  to mod  assist, improved from mod/max assist    Time 8    Period Weeks    Status Revised      PT LONG TERM GOAL #3   Title Caregiver will report standing at sink or RW at home, for at least 5 minutes, at least 5 days per week, with min guard>supervision, for improved standing tolerance for ADL participation.    Baseline 01/07/21: min assist in PT session    Time 8    Period Weeks    Status Revised  PT LONG TERM GOAL #4   Title Pt will be able to perform wheelchair transfer with min A for improved independence, decreased caregiver burden    Baseline 01/07/21: mod assist of one person with RW for stand pivot transfers. improved from mod/max assist with slide board transfer    Time 8    Period Weeks    Status Revised      PT LONG TERM GOAL #5   Title Pt/family/caregiver will verbalize plans for continued community fitness upon d/c from PT to maximize gains made in therapy.    Time 8    Period Weeks    Status New                   Plan - 01/14/21 0809     Clinical Impression Statement Today's skilled session continued to focus on strengthening, transfers and standing activities/pre gait with rest breaks taken as needed. Increased assistance needed this session to maintain right LE position/placement with transfers due to increased tone/spasms in right LE. This did improve as session progressed. The pt is making progress and should benefit from continued PT to progress toward unmet goals.    Personal Factors and Comorbidities Age;Time since onset of injury/illness/exacerbation    Examination-Activity Limitations Locomotion Level;Transfers;Sit;Stand;Toileting;Dressing;Hygiene/Grooming;Self Feeding;Bed Mobility;Bend;Bathing    Examination-Participation Restrictions Community Activity;Shop;Meal Prep;Yard Work    Stability/Clinical Decision Making Evolving/Moderate complexity    Rehab Potential Fair    PT Frequency 2x / week    PT Duration 8 weeks   per recert after 01/06/21  visit   PT Treatment/Interventions ADLs/Self Care Home Management;Electrical Stimulation;DME Instruction;Gait training;Functional mobility training;Therapeutic activities;Therapeutic exercise;Balance training;Neuromuscular re-education;Manual techniques;Patient/family education;Orthotic Fit/Training;Wheelchair mobility training;Passive range of motion;Dry needling;Taping    PT Next Visit Plan Include caregiver or son in education for sliding board and transfer training.  continue to work on stand pivot transfers with RW, standing balance with RW, pre gait activities. LE stretching to assist with tone/spasticity management   PT to request additional VA auth   PT Home Exercise Plan Forward lean x20 with caregiver; standing 3x daily for at least 30 sec with caregiver; [employer] 12/29/20    Consulted and Agree with Plan of Care Patient;Family member/caregiver    Family Member Consulted Son             Patient will benefit from skilled therapeutic intervention in order to improve the following deficits and impairments:  Abnormal gait, Difficulty walking, Impaired tone, Decreased range of motion, Decreased coordination, Decreased endurance, Impaired UE functional use, Decreased activity tolerance, Pain, Decreased balance, Decreased mobility, Decreased strength, Impaired sensation, Postural dysfunction  Visit Diagnosis: Muscle weakness (generalized)  Unsteadiness on feet  Hemiplegia and hemiparesis following cerebral infarction affecting right dominant side [facility])     Problem List Patient Active Problem List   Diagnosis Date Noted   Cerebrovascular accident (CVA) (HCC)    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   Hypertensive urgency 03/12/2020   Acute on chronic systolic CHF (congestive heart failure) (HCC) 03/12/2020   AKI (acute kidney injury) (HCC) 03/12/2020   Fall at home, initial encounter 03/12/2020   Right hip pain 03/12/2020   Hypokalemia 03/12/2020   Coronary  artery disease involving native coronary artery of native heart without angina pectoris 09/07/2014   Renal insufficiency 12/02/2012   Preventative health care 11/13/2010   Secondary cardiomyopathy (HCC) 01/24/2010   TACHYCARDIA 01/24/2010   Type 2 diabetes mellitus without complication, with long-term current use of insulin (HCC) 11/20/2008   Mixed hyperlipidemia 11/12/2007  Essential hypertension 11/12/2007   CONGESTIVE HEART FAILURE 11/12/2007    Sallyanne Kuster, PTA 01/14/2021, 6:49 PM  [facility] [address] [place], [place], [address] Phone: [phone]   Fax:  [phone]  Name: Trevor Anderson MRN: [id number] Date of Birth: 10-03-1955

## 2021-01-14 NOTE — Therapy (Signed)
[facility] [address] [place], [place], [address] Phone: [phone]   Fax:  [phone]  Occupational Therapy Treatment  Patient Details  Name: Trevor Anderson MRN: [id number] Date of Birth: 09/19/1955 Referring Provider (OT): Noel Gerold, NP   Encounter Date: 01/14/2021   OT End of Session - 01/14/21 0936     Visit Number 2    Number of Visits 15    Date for OT Re-Evaluation 03/23/21    Authorization Type VA    Authorization Time Period 15 visits approved 01/12/21 - 04/27/21    Authorization - Visit Number 2    Authorization - Number of Visits 15    OT Start Time 0845    OT Stop Time 0930    OT Time Calculation (min) 45 min    Activity Tolerance Patient tolerated treatment well    Behavior During Therapy [facility] for tasks assessed/performed             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   CONGESTIVE HEART FAILURE 11/12/2007   DIABETES MELLITUS, TYPE II 11/20/2008   HYPERLIPIDEMIA 11/12/2007   HYPERTENSION 11/12/2007   TACHYCARDIA 01/24/2010    Past Surgical History:  Procedure Laterality Date   BUBBLE STUDY  03/16/2020   Procedure: BUBBLE STUDY;  Surgeon: Jodelle Red, MD;  Location: [facility] ENDOSCOPY;  Service: Cardiovascular;;   LOOP RECORDER INSERTION N/A 03/23/2020   Procedure: LOOP RECORDER INSERTION;  Surgeon: Hillis Range, MD;  [facility] INVASIVE CV LAB;  Service: Cardiovascular;  Laterality: N/A;   TEE WITHOUT CARDIOVERSION N/A 03/16/2020   Procedure: TRANSESOPHAGEAL ECHOCARDIOGRAM (TEE);  Surgeon: Jodelle Red, MD;  Location: [facility] ENDOSCOPY;  Service: Cardiovascular;  Laterality: N/A;   TONSILLECTOMY  1970    There were no vitals filed for this visit.   Subjective Assessment - 01/14/21 0843     Subjective  Pt brought splint in today.    Patient is accompanied by: Family member   son, Paulette   Pertinent History HLD, HTN, DM, CHF     Limitations Fall Risk. Mod stand pivot transfer w RW    Patient Stated Goals to move it (RUE)    Currently in Pain? Yes    Pain Score 2     Pain Location Hip    Pain Orientation Right    Pain Descriptors / Indicators Aching    Pain Type Chronic pain    Pain Onset More than a month ago    Pain Frequency Intermittent    Aggravating Factors  mal positioning/tone    Pain Relieving Factors stretching                 Manual Therapy for RUE for passive range of motion with shoulder flexion, external rotation and abduction and with elbow extension, wrist extension and hand and digit extension. Able to achieve neutral hand positioning with PROM.  Self PROM education to patient and son re: completing PROM at home. Taught patient self PROM at table for wrist and hand, elbow and shoulder. Demonstrated understanding.  ADL - home set up and discussed trialing getting new Tilt in space wheelchair into bathroom to see set up and accessibility. Pt's son verbalized understanding and will do.  Orthotic Fit tested fit of pre fab resting hand splint issued at [place]. Digits hanging off end of splint on RUE and would recommend a custom resting hand splint to be made to accommodate for prolonged digit stretch in extension and neutral wrist.  OT Short Term Goals - 01/12/21 1646       OT SHORT TERM GOAL #1   Title Pt and caregivers will be independent with HEP    Time 4    Period Weeks    Status New    Target Date 02/09/21      OT SHORT TERM GOAL #2   Title Pt will complete UB dressing with min A for pull over short sleeve shirt.    Baseline mod A    Time 4    Period Weeks    Status New      OT SHORT TERM GOAL #3   Title Pt and caregiver will verbalize understanding of adapted strategies and/or equipment for completing toilet and tub transfers with mod A consistently.    Baseline currently mod/max and not getting to shower.    Time 4    Period Weeks     Status New      OT SHORT TERM GOAL #4   Title Pt will achieve 90* of PROM for RUE shoulder flexion to reduce risk of malpositioning and increased pain.    Time 4    Period Weeks    Status New      OT SHORT TERM GOAL #5   Title Pt and caregivers will verbalize and demonstrate understanding of wear and care of any splints and/or orthoses PRN.    Time 4    Period Weeks    Status New               OT Long Term Goals - 01/12/21 1649       OT LONG TERM GOAL #1   Title Pt and caregivers will be independent with and updated HEPs    Time 10    Period Weeks    Status New    Target Date 03/23/21      OT LONG TERM GOAL #2   Title Pt will complete toilet and tub transfers consistently with min assistance with adapted strategies and equipment PRN    Baseline mod A for stand pivot    Time 10    Period Weeks    Status New      OT LONG TERM GOAL #3   Title Pt will complete bathing with min A    Baseline mod-max    Time 10    Period Weeks    Status New      OT LONG TERM GOAL #4   Title Pt will demonstrate composite flexion/extension of 15% or greater in RUE for preparing for active grasp/release and increasing functional use.    Baseline trace extension    Time 10    Period Weeks    Status New      OT LONG TERM GOAL #5   Title Pt will achieve -20 degrees of elbow extension in RUE of PROM or decreasing risk of skin breakdown    Baseline -40*    Time 10    Period Weeks    Status New                   Plan - 01/14/21 1610     Clinical Impression Statement Pt and son verbalized understanding and agreement of goals set at evaluation. Pt would benefit from custom resting hand splint - prefab that he has does not fit well and serve purpose for positioning.    OT Occupational Profile and History Problem Focused Assessment - Including review of records relating to presenting problem  Occupational performance deficits (Please refer to evaluation for details):  IADL's;ADL's;Leisure    Body Structure / Function / Physical Skills Tone;Strength;ADL;FMC;Mobility;GMC;ROM;IADL;UE functional use;Decreased knowledge of use of DME    Cognitive Skills Problem Solve    Rehab Potential Good    Clinical Decision Making Limited treatment options, no task modification necessary    Comorbidities Affecting Occupational Performance: None    Modification or Assistance to Complete Evaluation  No modification of tasks or assist necessary to complete eval    OT Frequency 2x / week    OT Duration Other (comment)   15 visits over 10 weeks d/t any scheduling conflicts.   OT Treatment/Interventions Self-care/ADL training;Manual Therapy;Patient/family education;Electrical Stimulation;Neuromuscular education;Functional Mobility Training;Passive range of motion;Cognitive remediation/compensation;Therapeutic exercise;Moist Heat;DME and/or AE instruction;Therapeutic activities;Aquatic Therapy;Splinting    Plan intiate making of custom resting hand splint, supine manual therapy to RUE shoulder and get supine stretches (self PROM)    OT Home Exercise Plan self PROM    Consulted and Agree with Plan of Care Patient;Family member/caregiver    Family Member Consulted son, [place]             Patient will benefit from skilled therapeutic intervention in order to improve the following deficits and impairments:   Body Structure / Function / Physical Skills: Tone, Strength, ADL, FMC, Mobility, GMC, ROM, IADL, UE functional use, Decreased knowledge of use of DME Cognitive Skills: Problem Solve     Visit Diagnosis: Muscle weakness (generalized)  Unsteadiness on feet  Hemiplegia and hemiparesis following cerebral infarction affecting right dominant side (HCC)  Other lack of coordination  Stiffness of right elbow, not elsewhere classified  Stiffness of right shoulder, not elsewhere classified  Attention and concentration deficit    Problem List Patient Active Problem List    Diagnosis Date Noted   Cerebrovascular accident (CVA) (HCC)    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   Hypertensive urgency 03/12/2020   Acute on chronic systolic CHF (congestive heart failure) (HCC) 03/12/2020   AKI (acute kidney injury) (HCC) 03/12/2020   Fall at home, initial encounter 03/12/2020   Right hip pain 03/12/2020   Hypokalemia 03/12/2020   Coronary artery disease involving native coronary artery of native heart without angina pectoris 09/07/2014   Renal insufficiency 12/02/2012   Preventative health care 11/13/2010   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

## 2021-01-18 ENCOUNTER — Encounter: Payer: No Typology Code available for payment source | Admitting: Occupational Therapy

## 2021-01-19 ENCOUNTER — Ambulatory Visit: Payer: No Typology Code available for payment source | Admitting: Occupational Therapy

## 2021-01-21 ENCOUNTER — Ambulatory Visit: Payer: No Typology Code available for payment source | Admitting: Physical Therapy

## 2021-01-21 ENCOUNTER — Encounter: Payer: Self-pay | Admitting: Occupational Therapy

## 2021-01-21 ENCOUNTER — Other Ambulatory Visit: Payer: Self-pay

## 2021-01-21 ENCOUNTER — Ambulatory Visit: Payer: No Typology Code available for payment source | Admitting: Occupational Therapy

## 2021-01-21 DIAGNOSIS — R293 Abnormal posture: Secondary | ICD-10-CM

## 2021-01-21 DIAGNOSIS — M6281 Muscle weakness (generalized): Secondary | ICD-10-CM

## 2021-01-21 DIAGNOSIS — I69351 Hemiplegia and hemiparesis following cerebral infarction affecting right dominant side: Secondary | ICD-10-CM

## 2021-01-21 DIAGNOSIS — M25621 Stiffness of right elbow, not elsewhere classified: Secondary | ICD-10-CM

## 2021-01-21 DIAGNOSIS — R278 Other lack of coordination: Secondary | ICD-10-CM

## 2021-01-21 DIAGNOSIS — R2681 Unsteadiness on feet: Secondary | ICD-10-CM

## 2021-01-21 DIAGNOSIS — M25611 Stiffness of right shoulder, not elsewhere classified: Secondary | ICD-10-CM

## 2021-01-21 DIAGNOSIS — R41840 Attention and concentration deficit: Secondary | ICD-10-CM

## 2021-01-21 NOTE — Therapy (Signed)
[facility] [address] [place], [place], [address] Phone: [phone]   Fax:  [phone]  Physical Therapy Treatment  Patient Details  Name: Trevor Anderson MRN: [id number] Date of Birth: 07-27-1955 Referring Provider (PT): Noel Gerold   Encounter Date: 01/21/2021   PT End of Session - 01/21/21 1348     Visit Number 15    Number of Visits 28    Date for PT Re-Evaluation 03/04/21   per recert after 01/06/21 visit   Authorization Type VA auth# [id number] (15 visits)    Authorization - Visit Number 15    Authorization - Number of Visits 15    PT Start Time 1250    PT Stop Time 1330    PT Time Calculation (min) 40 min    Equipment Utilized During Treatment Gait belt    Activity Tolerance Patient tolerated treatment well   RLE spasms throughout session   Behavior During Therapy [facility] for tasks assessed/performed             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   CONGESTIVE HEART FAILURE 11/12/2007   DIABETES MELLITUS, TYPE II 11/20/2008   HYPERLIPIDEMIA 11/12/2007   HYPERTENSION 11/12/2007   TACHYCARDIA 01/24/2010    Past Surgical History:  Procedure Laterality Date   BUBBLE STUDY  03/16/2020   Procedure: BUBBLE STUDY;  Surgeon: Jodelle Red, MD;  Location: [facility] ENDOSCOPY;  Service: Cardiovascular;;   LOOP RECORDER INSERTION N/A 03/23/2020   Procedure: LOOP RECORDER INSERTION;  Surgeon: Hillis Range, MD;  Location: [facility] INVASIVE CV LAB;  Service: Cardiovascular;  Laterality: N/A;   TEE WITHOUT CARDIOVERSION N/A 03/16/2020   Procedure: TRANSESOPHAGEAL ECHOCARDIOGRAM (TEE);  Surgeon: Jodelle Red, MD;  Location: [facility] ENDOSCOPY;  Service: Cardiovascular;  Laterality: N/A;   TONSILLECTOMY  1970    There were no vitals filed for this visit.   Subjective Assessment - 01/21/21 1256     Subjective Son reports he has talked to [place] and they are working on the  doctor in [place] to manage tone.    Limitations Standing    How long can you stand comfortably? 5-10 minutes    Patient Stated Goals Open/close hand; Get more R LE motion;    Currently in Pain? Yes    Pain Score 2     Pain Location Hip    Pain Orientation Right    Pain Descriptors / Indicators Aching    Pain Type Chronic pain    Pain Onset More than a month ago    Pain Frequency Intermittent    Aggravating Factors  malpositioning/tone    Pain Relieving Factors stretching                               [facility] Adult PT Treatment/Exercise - 01/21/21 0001       Transfers   Transfers Sit to Stand;Stand to Sit;Stand Pivot Transfers    Sit to Stand 3: Mod assist    Sit to Stand Details Tactile cues for weight shifting;Tactile cues for placement;Verbal cues for technique;Verbal cues for sequencing    Sit to Stand Details (indicate cue type and reason) PT places her leg posterior and medial to pt's RLE to assist with RLE stability to stand.    Stand to Sit 3: Mod assist;With upper extremity assist;To elevated surface;To bed;To chair/3-in-1    Stand to Sit Details (indicate cue type and reason) Tactile cues for  weight shifting;Verbal cues for sequencing;Verbal cues for technique    Stand to Sit Details Cues to reach back to sit    Stand Pivot Transfers 3: Mod assist;2: Max assist    Stand Pivot Transfer Details (indicate cue type and reason) Use of RW to go w/c>mat table, pt needs max assist to scoot to edge of w/c.  Stand pivot with LUE holding to RW, cues to pivot to fully turn to sit.  From mat>w/c (towards L side), SPT with mod assist.  Cues for sequence and educated son to remove RUE armrest on w/c.      Therapeutic Activites    Therapeutic Activities Other Therapeutic Activities    Other Therapeutic Activities Reviewed with pt and son safest technique for SPT transfers from bed>w/c at home (PT educates to try to perform towards strong side.  Discussed current  exercises and work on standing at sink to increase standing tolerance, increase RLE weightbearing.  Discussed that pt will hold on PT until reauth has been recevied from [place].      Neuro Re-ed    Neuro Re-ed Details  Sit to stand x 5 reps at RW.  Stand trials at least 60 seconds, with tactile and verbal cues for increased weightshifting to R side.  Cues for upright posture.  Weightshift to RLE with L forward step>back to midline x 3 reps; then standing on RLE with LLE hip/knee flexion x 5 reps.            Seated at edge of mat:  forward lean to upright posture, 2 sets x 10 reps.  Seated anterior/posterior pelvic tilts x 10 reps, with tactile cues.  Seated lateral pelvic tilts x 5 reps.  Performed prior to standing to assist with flexibility through hips and increased forward lean.         PT Education - 01/21/21 1347     Education Details See therapeutic Activity    Person(s) Educated Patient;Child(ren)    Methods Explanation;Demonstration    Comprehension Verbalized understanding              PT Short Term Goals - 01/12/21 0729       PT SHORT TERM GOAL #1   Title Pt will perform HEP with caregiver assistance, for improved flexibility, strength, transfers.  TARGET 02/04/2021    Time 4    Period Weeks    Status New      PT SHORT TERM GOAL #2   Title Pt will perform sit to stand transfer with mod assist.    Baseline currently max assist with sit<>stand    Time 4    Period Weeks    Status New      PT SHORT TERM GOAL #3   Title Caregiver will verbalize improved stand pivot transfers from w/c<>mat/bed with mod assist (at least 50% of the time).    Baseline mod/max with sliding board, has just improved to mod assist in clinic 01/06/2021    Time 4    Period Weeks    Status New               PT Long Term Goals - 01/12/21 6063       PT LONG TERM GOAL #1   Title Pt will be able to perform progression of/finalized HEP with caregiver assist,  TARGET 03/04/21     Baseline has initial HEP and will benefit from continued progression of HEP    Time 8    Period Weeks    Status  Revised      PT LONG TERM GOAL #2   Title Pt will be able to perform all edge of bed scooting with min A for improved ease of transfers, decreased caregiver burden    Baseline 01/07/21: pt continues to need cues and up to min to mod  assist, improved from mod/max assist    Time 8    Period Weeks    Status Revised      PT LONG TERM GOAL #3   Title Caregiver will report standing at sink or RW at home, for at least 5 minutes, at least 5 days per week, with min guard>supervision, for improved standing tolerance for ADL participation.    Baseline 01/07/21: min assist in PT session    Time 8    Period Weeks    Status Revised      PT LONG TERM GOAL #4   Title Pt will be able to perform wheelchair transfer with min A for improved independence, decreased caregiver burden    Baseline 01/07/21: mod assist of one person with RW for stand pivot transfers. improved from mod/max assist with slide board transfer    Time 8    Period Weeks    Status Revised      PT LONG TERM GOAL #5   Title Pt/family/caregiver will verbalize plans for continued community fitness upon d/c from PT to maximize gains made in therapy.    Time 8    Period Weeks    Status New                   Plan - 01/21/21 1348     Clinical Impression Statement Pt's tone/spasticity in RLE does not appear as significant in sessions past, as he is able to maintain weight through RLE with transfers and standing.  Educated son and patient in optimal transfer technique for Stand pivot transfers, to his strong side, coming out of w/c towards L side, and into w/c towards L side.  Son reports using sliding board has not worked wella t home and SPT seem to work better.  Pt is making slow, steady progress with therapy and is improving with standing tolerance at RW.  He will be out of therapy, awaiting futher therapy visit  approval through [place].    Personal Factors and Comorbidities Age;Time since onset of injury/illness/exacerbation    Examination-Activity Limitations Locomotion Level;Transfers;Sit;Stand;Toileting;Dressing;Hygiene/Grooming;Self Feeding;Bed Mobility;Bend;Bathing    Examination-Participation Restrictions Community Activity;Shop;Meal Prep;Yard Work    Stability/Clinical Decision Making Evolving/Moderate complexity    Rehab Potential Fair    PT Frequency 2x / week    PT Duration 8 weeks   per recert after 01/06/21 visit   PT Treatment/Interventions ADLs/Self Care Home Management;Electrical Stimulation;DME Instruction;Gait training;Functional mobility training;Therapeutic activities;Therapeutic exercise;Balance training;Neuromuscular re-education;Manual techniques;Patient/family education;Orthotic Fit/Training;Wheelchair mobility training;Passive range of motion;Dry needling;Taping    PT Next Visit Plan PT awaiting further VA approval.  Try standing in parallel bars or at sink to increase standing time and UE support for standing balance activities.  continue to work on stand pivot transfers with RW, standing balance with RW, pre gait activities. LE stretching to assist with tone/spasticity management   PT to request additional VA auth   PT Home Exercise Plan Forward lean x20 with caregiver; standing 3x daily for at least 30 sec with caregiver; [employer] 12/29/20    Consulted and Agree with Plan of Care Patient;Family member/caregiver    Family Member Consulted Son  Patient will benefit from skilled therapeutic intervention in order to improve the following deficits and impairments:  Abnormal gait, Difficulty walking, Impaired tone, Decreased range of motion, Decreased coordination, Decreased endurance, Impaired UE functional use, Decreased activity tolerance, Pain, Decreased balance, Decreased mobility, Decreased strength, Impaired sensation, Postural dysfunction  Visit  Diagnosis: Unsteadiness on feet  Abnormal posture  Muscle weakness (generalized)     Problem List Patient Active Problem List   Diagnosis Date Noted   Cerebrovascular accident (CVA) (HCC)    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   Hypertensive urgency 03/12/2020   Acute on chronic systolic CHF (congestive heart failure) (HCC) 03/12/2020   AKI (acute kidney injury) (HCC) 03/12/2020   Fall at home, initial encounter 03/12/2020   Right hip pain 03/12/2020   Hypokalemia 03/12/2020   Coronary artery disease involving native coronary artery of native heart without angina pectoris 09/07/2014   Renal insufficiency 12/02/2012   Preventative health care 11/13/2010   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

## 2021-01-21 NOTE — Therapy (Signed)
[facility] [address] [place], [place], [address] Phone: [phone]   Fax:  [phone]  Occupational Therapy Treatment  Patient Details  Name: Trevor Anderson MRN: [id number] Date of Birth: 11/11/1955 Referring Provider (OT): Noel Gerold, NP   Encounter Date: 01/21/2021   OT End of Session - 01/21/21 1340     Visit Number 3    Number of Visits 15    Date for OT Re-Evaluation 03/23/21    Authorization Type VA    Authorization Time Period 15 visits approved 01/12/21 - 04/27/21    Authorization - Visit Number 3    Authorization - Number of Visits 15    OT Start Time 1333    OT Stop Time 1400    OT Time Calculation (min) 27 min    Activity Tolerance Patient tolerated treatment well    Behavior During Therapy [facility] for tasks assessed/performed             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   CONGESTIVE HEART FAILURE 11/12/2007   DIABETES MELLITUS, TYPE II 11/20/2008   HYPERLIPIDEMIA 11/12/2007   HYPERTENSION 11/12/2007   TACHYCARDIA 01/24/2010    Past Surgical History:  Procedure Laterality Date   BUBBLE STUDY  03/16/2020   Procedure: BUBBLE STUDY;  Surgeon: Jodelle Red, MD;  Location: [facility] ENDOSCOPY;  Service: Cardiovascular;;   LOOP RECORDER INSERTION N/A 03/23/2020   Procedure: LOOP RECORDER INSERTION;  Surgeon: Hillis Range, MD;  Location: [facility] INVASIVE CV LAB;  Service: Cardiovascular;  Laterality: N/A;   TEE WITHOUT CARDIOVERSION N/A 03/16/2020   Procedure: TRANSESOPHAGEAL ECHOCARDIOGRAM (TEE);  Surgeon: Jodelle Red, MD;  Location: [facility] ENDOSCOPY;  Service: Cardiovascular;  Laterality: N/A;   TONSILLECTOMY  1970    There were no vitals filed for this visit.   Subjective Assessment - 01/21/21 1340     Subjective  "just a little bit in my hip"    Patient is accompanied by: Family member   son, Eusebio   Pertinent History HLD, HTN, DM, CHF     Limitations Fall Risk. Mod stand pivot transfer w RW    Patient Stated Goals to move it (RUE)    Currently in Pain? Yes    Pain Score 2     Pain Location Hip    Pain Orientation Right    Pain Descriptors / Indicators Aching    Pain Type Chronic pain    Pain Onset More than a month ago    Pain Frequency Intermittent    Aggravating Factors  malpositioning    Pain Relieving Factors stretching                 Began fabrication of custom resting hand splint for RUE. Needs straps and final fitting.                   OT Short Term Goals - 01/12/21 1646       OT SHORT TERM GOAL #1   Title Pt and caregivers will be independent with HEP    Time 4    Period Weeks    Status New    Target Date 02/09/21      OT SHORT TERM GOAL #2   Title Pt will complete UB dressing with min A for pull over short sleeve shirt.    Baseline mod A    Time 4    Period Weeks    Status New      OT  SHORT TERM GOAL #3   Title Pt and caregiver will verbalize understanding of adapted strategies and/or equipment for completing toilet and tub transfers with mod A consistently.    Baseline currently mod/max and not getting to shower.    Time 4    Period Weeks    Status New      OT SHORT TERM GOAL #4   Title Pt will achieve 90* of PROM for RUE shoulder flexion to reduce risk of malpositioning and increased pain.    Time 4    Period Weeks    Status New      OT SHORT TERM GOAL #5   Title Pt and caregivers will verbalize and demonstrate understanding of wear and care of any splints and/or orthoses PRN.    Time 4    Period Weeks    Status New               OT Long Term Goals - 01/12/21 1649       OT LONG TERM GOAL #1   Title Pt and caregivers will be independent with and updated HEPs    Time 10    Period Weeks    Status New    Target Date 03/23/21      OT LONG TERM GOAL #2   Title Pt will complete toilet and tub transfers consistently with min assistance with adapted  strategies and equipment PRN    Baseline mod A for stand pivot    Time 10    Period Weeks    Status New      OT LONG TERM GOAL #3   Title Pt will complete bathing with min A    Baseline mod-max    Time 10    Period Weeks    Status New      OT LONG TERM GOAL #4   Title Pt will demonstrate composite flexion/extension of 15% or greater in RUE for preparing for active grasp/release and increasing functional use.    Baseline trace extension    Time 10    Period Weeks    Status New      OT LONG TERM GOAL #5   Title Pt will achieve -20 degrees of elbow extension in RUE of PROM or decreasing risk of skin breakdown    Baseline -40*    Time 10    Period Weeks    Status New                   Plan - 01/21/21 1408     Clinical Impression Statement beginning of custom resting hand splint for RUE.    OT Occupational Profile and History Problem Focused Assessment - Including review of records relating to presenting problem    Occupational performance deficits (Please refer to evaluation for details): IADL's;ADL's;Leisure    Body Structure / Function / Physical Skills Tone;Strength;ADL;FMC;Mobility;GMC;ROM;IADL;UE functional use;Decreased knowledge of use of DME    Cognitive Skills Problem Solve    Rehab Potential Good    Clinical Decision Making Limited treatment options, no task modification necessary    Comorbidities Affecting Occupational Performance: None    Modification or Assistance to Complete Evaluation  No modification of tasks or assist necessary to complete eval    OT Frequency 2x / week    OT Duration Other (comment)   15 visits over 10 weeks d/t any scheduling conflicts.   OT Treatment/Interventions Self-care/ADL training;Manual Therapy;Patient/family education;Electrical Stimulation;Neuromuscular education;Functional Mobility Training;Passive range of motion;Cognitive remediation/compensation;Therapeutic exercise;Moist Heat;DME and/or AE  instruction;Therapeutic  activities;Aquatic Therapy;Splinting    Plan finish making of custom resting hand splint (Kertie's desk), supine manual therapy to RUE shoulder and get supine stretches (self PROM)    OT Home Exercise Plan self PROM    Consulted and Agree with Plan of Care Patient;Family member/caregiver    Family Member Consulted son, Wong             Patient will benefit from skilled therapeutic intervention in order to improve the following deficits and impairments:   Body Structure / Function / Physical Skills: Tone, Strength, ADL, FMC, Mobility, GMC, ROM, IADL, UE functional use, Decreased knowledge of use of DME Cognitive Skills: Problem Solve     Visit Diagnosis: Muscle weakness (generalized)  Hemiplegia and hemiparesis following cerebral infarction affecting right dominant side (HCC)  Other lack of coordination  Unsteadiness on feet  Stiffness of right elbow, not elsewhere classified  Attention and concentration deficit  Stiffness of right shoulder, not elsewhere classified    Problem List Patient Active Problem List   Diagnosis Date Noted   Cerebrovascular accident (CVA) (HCC)    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   Hypertensive urgency 03/12/2020   Acute on chronic systolic CHF (congestive heart failure) (HCC) 03/12/2020   AKI (acute kidney injury) (HCC) 03/12/2020   Fall at home, initial encounter 03/12/2020   Right hip pain 03/12/2020   Hypokalemia 03/12/2020   Coronary artery disease involving native coronary artery of native heart without angina pectoris 09/07/2014   Renal insufficiency 12/02/2012   Preventative health care 11/13/2010   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 [place], [place], [address] Phone: [phone]   Fax:  [phone]  Name: JAVONTE ELENES MRN: [id number] Date of Birth: Jun 12, 1955

## 2021-01-24 ENCOUNTER — Ambulatory Visit (INDEPENDENT_AMBULATORY_CARE_PROVIDER_SITE_OTHER): Payer: No Typology Code available for payment source

## 2021-01-24 ENCOUNTER — Encounter: Payer: Self-pay | Admitting: Occupational Therapy

## 2021-01-24 ENCOUNTER — Ambulatory Visit: Payer: No Typology Code available for payment source | Attending: Internal Medicine | Admitting: Occupational Therapy

## 2021-01-24 ENCOUNTER — Other Ambulatory Visit: Payer: Self-pay

## 2021-01-24 DIAGNOSIS — R2681 Unsteadiness on feet: Secondary | ICD-10-CM

## 2021-01-24 DIAGNOSIS — G463 Brain stem stroke syndrome: Secondary | ICD-10-CM | POA: Diagnosis not present

## 2021-01-24 DIAGNOSIS — I69351 Hemiplegia and hemiparesis following cerebral infarction affecting right dominant side: Secondary | ICD-10-CM

## 2021-01-24 DIAGNOSIS — R41840 Attention and concentration deficit: Secondary | ICD-10-CM

## 2021-01-24 DIAGNOSIS — M25611 Stiffness of right shoulder, not elsewhere classified: Secondary | ICD-10-CM

## 2021-01-24 DIAGNOSIS — M6281 Muscle weakness (generalized): Secondary | ICD-10-CM

## 2021-01-24 DIAGNOSIS — R278 Other lack of coordination: Secondary | ICD-10-CM

## 2021-01-24 DIAGNOSIS — I639 Cerebral infarction, unspecified: Secondary | ICD-10-CM

## 2021-01-24 DIAGNOSIS — M25621 Stiffness of right elbow, not elsewhere classified: Secondary | ICD-10-CM

## 2021-01-24 NOTE — Therapy (Signed)
[facility] [address] [place], [place], [address] Phone: [phone]   Fax:  [phone]  Occupational Therapy Treatment  Patient Details  Name: Trevor Anderson MRN: [id number] Date of Birth: 01-May-1955 Referring Provider (OT): Noel Gerold, NP   Encounter Date: 01/24/2021   OT End of Session - 01/24/21 1327     Visit Number 4    Number of Visits 15    Date for OT Re-Evaluation 03/23/21    Authorization Type [place]    Authorization Time Period 15 visits approved 01/12/21 - 04/27/21    Authorization - Visit Number 4    Authorization - Number of Visits 15    OT Start Time 1237    OT Stop Time 1315    OT Time Calculation (min) 38 min    Activity Tolerance Patient tolerated treatment well    Behavior During Therapy [facility] for tasks assessed/performed             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   CONGESTIVE HEART FAILURE 11/12/2007   DIABETES MELLITUS, TYPE II 11/20/2008   HYPERLIPIDEMIA 11/12/2007   HYPERTENSION 11/12/2007   TACHYCARDIA 01/24/2010    Past Surgical History:  Procedure Laterality Date   BUBBLE STUDY  03/16/2020   Procedure: BUBBLE STUDY;  Surgeon: Jodelle Red, MD;  Location: [facility] ENDOSCOPY;  Service: Cardiovascular;;   LOOP RECORDER INSERTION N/A 03/23/2020   Procedure: LOOP RECORDER INSERTION;  Surgeon: Hillis Range, MD;  Location: [facility] INVASIVE CV LAB;  Service: Cardiovascular;  Laterality: N/A;   TEE WITHOUT CARDIOVERSION N/A 03/16/2020   Procedure: TRANSESOPHAGEAL ECHOCARDIOGRAM (TEE);  Surgeon: Jodelle Red, MD;  Location: [facility] ENDOSCOPY;  Service: Cardiovascular;  Laterality: N/A;   TONSILLECTOMY  1970    There were no vitals filed for this visit.    Finished fabrication of custom resting hand splint - issued wear and care instructions                     OT Education - 01/24/21 1323     Education Details  Custom Resting hand splint wear and care    Person(s) Educated Patient;Caregiver(s)    Methods Explanation;Demonstration;Handout    Comprehension Verbalized understanding;Returned demonstration;Need further instruction              OT Short Term Goals - 01/12/21 1646       OT SHORT TERM GOAL #1   Title Pt and caregivers will be independent with HEP    Time 4    Period Weeks    Status New    Target Date 02/09/21      OT SHORT TERM GOAL #2   Title Pt will complete UB dressing with min A for pull over short sleeve shirt.    Baseline mod A    Time 4    Period Weeks    Status New      OT SHORT TERM GOAL #3   Title Pt and caregiver will verbalize understanding of adapted strategies and/or equipment for completing toilet and tub transfers with mod A consistently.    Baseline currently mod/max and not getting to shower.    Time 4    Period Weeks    Status New      OT SHORT TERM GOAL #4   Title Pt will achieve 90* of PROM for RUE shoulder flexion to reduce risk of malpositioning and increased pain.    Time 4    Period Weeks  Status New      OT SHORT TERM GOAL #5   Title Pt and caregivers will verbalize and demonstrate understanding of wear and care of any splints and/or orthoses PRN.    Time 4    Period Weeks    Status New               OT Long Term Goals - 01/12/21 1649       OT LONG TERM GOAL #1   Title Pt and caregivers will be independent with and updated HEPs    Time 10    Period Weeks    Status New    Target Date 03/23/21      OT LONG TERM GOAL #2   Title Pt will complete toilet and tub transfers consistently with min assistance with adapted strategies and equipment PRN    Baseline mod A for stand pivot    Time 10    Period Weeks    Status New      OT LONG TERM GOAL #3   Title Pt will complete bathing with min A    Baseline mod-max    Time 10    Period Weeks    Status New      OT LONG TERM GOAL #4   Title Pt will demonstrate composite  flexion/extension of 15% or greater in RUE for preparing for active grasp/release and increasing functional use.    Baseline trace extension    Time 10    Period Weeks    Status New      OT LONG TERM GOAL #5   Title Pt will achieve -20 degrees of elbow extension in RUE of PROM or decreasing risk of skin breakdown    Baseline -40*    Time 10    Period Weeks    Status New                   Plan - 01/24/21 1329     Clinical Impression Statement pt issued custom resting hand splint today.    OT Occupational Profile and History Problem Focused Assessment - Including review of records relating to presenting problem    Occupational performance deficits (Please refer to evaluation for details): IADL's;ADL's;Leisure    Body Structure / Function / Physical Skills Tone;Strength;ADL;FMC;Mobility;GMC;ROM;IADL;UE functional use;Decreased knowledge of use of DME    Cognitive Skills Problem Solve    Rehab Potential Good    Clinical Decision Making Limited treatment options, no task modification necessary    Comorbidities Affecting Occupational Performance: None    Modification or Assistance to Complete Evaluation  No modification of tasks or assist necessary to complete eval    OT Frequency 2x / week    OT Duration Other (comment)   15 visits over 10 weeks d/t any scheduling conflicts.   OT Treatment/Interventions Self-care/ADL training;Manual Therapy;Patient/family education;Electrical Stimulation;Neuromuscular education;Functional Mobility Training;Passive range of motion;Cognitive remediation/compensation;Therapeutic exercise;Moist Heat;DME and/or AE instruction;Therapeutic activities;Aquatic Therapy;Splinting    Plan make any adjustments to splint PRN, supine manual therapy to RUE shoulder and get supine stretches (self PROM)    OT Home Exercise Plan self PROM    Consulted and Agree with Plan of Care Patient;Family member/caregiver    Family Member Consulted son, [name]              Patient will benefit from skilled therapeutic intervention in order to improve the following deficits and impairments:   Body Structure / Function / Physical Skills: Tone, Strength, ADL, FMC, Mobility, GMC, ROM,  IADL, UE functional use, Decreased knowledge of use of DME Cognitive Skills: Problem Solve     Visit Diagnosis: Muscle weakness (generalized)  Hemiplegia and hemiparesis following cerebral infarction affecting right dominant side (HCC)  Other lack of coordination  Unsteadiness on feet  Stiffness of right elbow, not elsewhere classified  Attention and concentration deficit  Stiffness of right shoulder, not elsewhere classified    Problem List Patient Active Problem List   Diagnosis Date Noted   Cerebrovascular accident (CVA) (HCC)    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   Hypertensive urgency 03/12/2020   Acute on chronic systolic CHF (congestive heart failure) (HCC) 03/12/2020   AKI (acute kidney injury) (HCC) 03/12/2020   Fall at home, initial encounter 03/12/2020   Right hip pain 03/12/2020   Hypokalemia 03/12/2020   Coronary artery disease involving native coronary artery of native heart without angina pectoris 09/07/2014   Renal insufficiency 12/02/2012   Preventative health care 11/13/2010   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

## 2021-01-24 NOTE — Patient Instructions (Signed)
Issued wear and care instructions for custom resting hand splint.   Access Code: [email or web address] URL: [email or web address] Date: 01/24/2021 Prepared by: Kallie Edward  Patient Education Splint Wear and Care

## 2021-01-26 ENCOUNTER — Ambulatory Visit: Payer: No Typology Code available for payment source | Admitting: Occupational Therapy

## 2021-01-26 LAB — CUP PACEART REMOTE DEVICE CHECK
Date Time Interrogation Session: 20220928174508
Implantable Pulse Generator Implant Date: 20211130

## 2021-01-27 ENCOUNTER — Other Ambulatory Visit: Payer: Self-pay

## 2021-01-27 ENCOUNTER — Ambulatory Visit: Payer: No Typology Code available for payment source | Admitting: Occupational Therapy

## 2021-01-27 ENCOUNTER — Encounter: Payer: Self-pay | Admitting: Occupational Therapy

## 2021-01-27 DIAGNOSIS — R2681 Unsteadiness on feet: Secondary | ICD-10-CM

## 2021-01-27 DIAGNOSIS — G463 Brain stem stroke syndrome: Secondary | ICD-10-CM | POA: Diagnosis not present

## 2021-01-27 DIAGNOSIS — R293 Abnormal posture: Secondary | ICD-10-CM

## 2021-01-27 DIAGNOSIS — R278 Other lack of coordination: Secondary | ICD-10-CM

## 2021-01-27 DIAGNOSIS — M25621 Stiffness of right elbow, not elsewhere classified: Secondary | ICD-10-CM

## 2021-01-27 DIAGNOSIS — R41840 Attention and concentration deficit: Secondary | ICD-10-CM

## 2021-01-27 DIAGNOSIS — I69351 Hemiplegia and hemiparesis following cerebral infarction affecting right dominant side: Secondary | ICD-10-CM

## 2021-01-27 DIAGNOSIS — M25611 Stiffness of right shoulder, not elsewhere classified: Secondary | ICD-10-CM

## 2021-01-27 DIAGNOSIS — M6281 Muscle weakness (generalized): Secondary | ICD-10-CM

## 2021-01-27 NOTE — Therapy (Signed)
[facility] [address] [place], [place], [address] Phone: [phone]   Fax:  [phone]  Occupational Therapy Treatment  Patient Details  Name: Trevor Anderson MRN: [id number] Date of Birth: 10/02/1955 Referring Provider (OT): Noel Gerold, NP   Encounter Date: 01/27/2021   OT End of Session - 01/27/21 1845     Visit Number 5    Number of Visits 15    Date for OT Re-Evaluation 03/23/21    Authorization Type [facility]    Authorization Time Period 15 visits approved 01/12/21 - 04/27/21    Authorization - Visit Number 5    Authorization - Number of Visits 15    Progress Note Due on Visit 10    OT Start Time 1315    OT Stop Time 1400    OT Time Calculation (min) 45 min    Activity Tolerance Patient tolerated treatment well;Patient limited by pain    Behavior During Therapy [facility] for tasks assessed/performed             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   CONGESTIVE HEART FAILURE 11/12/2007   DIABETES MELLITUS, TYPE II 11/20/2008   HYPERLIPIDEMIA 11/12/2007   HYPERTENSION 11/12/2007   TACHYCARDIA 01/24/2010    Past Surgical History:  Procedure Laterality Date   BUBBLE STUDY  03/16/2020   Procedure: BUBBLE STUDY;  Surgeon: Jodelle Red, MD;  Location: [facility] ENDOSCOPY;  Service: Cardiovascular;;   LOOP RECORDER INSERTION N/A 03/23/2020   Procedure: LOOP RECORDER INSERTION;  Surgeon: Hillis Range, MD;  Location: [facility] INVASIVE CV LAB;  Service: Cardiovascular;  Laterality: N/A;   TEE WITHOUT CARDIOVERSION N/A 03/16/2020   Procedure: TRANSESOPHAGEAL ECHOCARDIOGRAM (TEE);  Surgeon: Jodelle Red, MD;  Location: [facility] ENDOSCOPY;  Service: Cardiovascular;  Laterality: N/A;   TONSILLECTOMY  1970    There were no vitals filed for this visit.   Subjective Assessment - 01/27/21 1839     Subjective  Hip hurts    Patient is accompanied by: Family member    Pertinent  History HLD, HTN, DM, CHF    Limitations Fall Risk. Mod stand pivot transfer w RW    Patient Stated Goals to move it (RUE)    Currently in Pain? Yes    Pain Score 5     Pain Location Hip    Pain Orientation Right    Pain Descriptors / Indicators Aching    Pain Type Chronic pain    Pain Onset More than a month ago    Pain Frequency Intermittent    Aggravating Factors  movement, spasms    Pain Relieving Factors reposition, rest, support                          OT Treatments/Exercises (OP) - 01/27/21 0001       Neurological Re-education Exercises   Other Exercises 1 Neuromuscular reeducation to address postural control to aide with transfer from wheelchair.  Patient with extreme difficulty weight shifting forward to allow feet on floor.  Patient pulling backward into extension.  Patient resistant to weight shifting toward right hip - reports pain.  Son indicates this is from an older injury - RLE at times in spasm with repositioning.  Once patient forward, ablt to use multiple squat pivots to transfer from chair to mat table.  In attempt to scoot or lift off surface, patient's right leg did not stay on surface (limited hip range available)  Transitioned  to supine and worked on rolling to right side.  Patient abel to isolate small amounts of elbow flexion and extension following facilitation and overt cueing to attend to right arm.  Patient attempting to use head, neck, core muscle activation in attempts to move right arm.  Responses were delayed but elbow flexion / extension accessible.  Worked on rolling both directions.  Returned to sitting - patient with decreased pain and stiffness in right hip.  Transferred back to wheelchair with max assist - patient better able to maintain weight forward to allow transfer.                      OT Short Term Goals - 01/27/21 1848       OT SHORT TERM GOAL #1   Title Pt and caregivers will be independent with HEP    Time 4     Period Weeks    Status New    Target Date 02/09/21      OT SHORT TERM GOAL #2   Title Pt will complete UB dressing with min A for pull over short sleeve shirt.    Baseline mod A    Time 4    Period Weeks    Status New      OT SHORT TERM GOAL #3   Title Pt and caregiver will verbalize understanding of adapted strategies and/or equipment for completing toilet and tub transfers with mod A consistently.    Baseline currently mod/max and not getting to shower.    Time 4    Period Weeks    Status New      OT SHORT TERM GOAL #4   Title Pt will achieve 90* of PROM for RUE shoulder flexion to reduce risk of malpositioning and increased pain.    Time 4    Period Weeks    Status New      OT SHORT TERM GOAL #5   Title Pt and caregivers will verbalize and demonstrate understanding of wear and care of any splints and/or orthoses PRN.    Time 4    Period Weeks    Status New               OT Long Term Goals - 01/27/21 1848       OT LONG TERM GOAL #1   Title Pt and caregivers will be independent with and updated HEPs    Time 10    Period Weeks    Status New      OT LONG TERM GOAL #2   Title Pt will complete toilet and tub transfers consistently with min assistance with adapted strategies and equipment PRN    Baseline mod A for stand pivot    Time 10    Period Weeks    Status New      OT LONG TERM GOAL #3   Title Pt will complete bathing with min A    Baseline mod-max    Time 10    Period Weeks    Status New      OT LONG TERM GOAL #4   Title Pt will demonstrate composite flexion/extension of 15% or greater in RUE for preparing for active grasp/release and increasing functional use.    Baseline trace extension    Time 10    Period Weeks    Status New      OT LONG TERM GOAL #5   Title Pt will achieve -20 degrees of elbow extension in RUE of  PROM or decreasing risk of skin breakdown    Baseline -40*    Time 10    Period Weeks    Status New                    Plan - 01/27/21 1846     Clinical Impression Statement Patient with significant hip pain today and RLE spasms which limited transitional movement ability.  Responded well to gentle motion supine to sidelying.    OT Occupational Profile and History Problem Focused Assessment - Including review of records relating to presenting problem    Occupational performance deficits (Please refer to evaluation for details): IADL's;ADL's;Leisure    Body Structure / Function / Physical Skills Tone;Strength;ADL;FMC;Mobility;GMC;ROM;IADL;UE functional use;Decreased knowledge of use of DME    Cognitive Skills Problem Solve    Rehab Potential Good    Clinical Decision Making Limited treatment options, no task modification necessary    Comorbidities Affecting Occupational Performance: None    Modification or Assistance to Complete Evaluation  No modification of tasks or assist necessary to complete eval    OT Frequency 2x / week    OT Duration Other (comment)   15 visits over 10 weeks d/t any scheduling conflicts.   OT Treatment/Interventions Self-care/ADL training;Manual Therapy;Patient/family education;Electrical Stimulation;Neuromuscular education;Functional Mobility Training;Passive range of motion;Cognitive remediation/compensation;Therapeutic exercise;Moist Heat;DME and/or AE instruction;Therapeutic activities;Aquatic Therapy;Splinting    Plan Postural control - needs to work on forward weight shifts for transfers, need to orient to midline,  make any adjustments to splint PRN, supine manual therapy to RUE shoulder and get supine stretches (self PROM)    OT Home Exercise Plan self PROM    Consulted and Agree with Plan of Care Patient;Family member/caregiver    Family Member Consulted son, Ryosuke             Patient will benefit from skilled therapeutic intervention in order to improve the following deficits and impairments:   Body Structure / Function / Physical Skills: Tone, Strength,  ADL, FMC, Mobility, GMC, ROM, IADL, UE functional use, Decreased knowledge of use of DME Cognitive Skills: Problem Solve     Visit Diagnosis: Abnormal posture  Hemiplegia and hemiparesis following cerebral infarction affecting right dominant side (HCC)  Other lack of coordination  Unsteadiness on feet  Stiffness of right elbow, not elsewhere classified  Attention and concentration deficit  Stiffness of right shoulder, not elsewhere classified  Muscle weakness (generalized)    Problem List Patient Active Problem List   Diagnosis Date Noted   Cerebrovascular accident (CVA) (HCC)    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   Hypertensive urgency 03/12/2020   Acute on chronic systolic CHF (congestive heart failure) (HCC) 03/12/2020   AKI (acute kidney injury) (HCC) 03/12/2020   Fall at home, initial encounter 03/12/2020   Right hip pain 03/12/2020   Hypokalemia 03/12/2020   Coronary artery disease involving native coronary artery of native heart without angina pectoris 09/07/2014   Renal insufficiency 12/02/2012   Preventative health care 11/13/2010   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 [place], [place], [address] Phone: [phone]   Fax:  [phone]  Name: Trevor Anderson MRN: [id number] Date of Birth: [birth year]

## 2021-02-01 NOTE — Progress Notes (Signed)
Carelink Summary Report / Loop Recorder 

## 2021-02-04 ENCOUNTER — Other Ambulatory Visit: Payer: Self-pay

## 2021-02-04 ENCOUNTER — Encounter: Payer: Self-pay | Admitting: Occupational Therapy

## 2021-02-04 ENCOUNTER — Ambulatory Visit: Payer: No Typology Code available for payment source | Admitting: Occupational Therapy

## 2021-02-04 DIAGNOSIS — G463 Brain stem stroke syndrome: Secondary | ICD-10-CM | POA: Diagnosis not present

## 2021-02-04 DIAGNOSIS — R278 Other lack of coordination: Secondary | ICD-10-CM

## 2021-02-04 DIAGNOSIS — R2681 Unsteadiness on feet: Secondary | ICD-10-CM

## 2021-02-04 DIAGNOSIS — I69351 Hemiplegia and hemiparesis following cerebral infarction affecting right dominant side: Secondary | ICD-10-CM

## 2021-02-04 DIAGNOSIS — R293 Abnormal posture: Secondary | ICD-10-CM

## 2021-02-04 DIAGNOSIS — M25611 Stiffness of right shoulder, not elsewhere classified: Secondary | ICD-10-CM

## 2021-02-04 DIAGNOSIS — M6281 Muscle weakness (generalized): Secondary | ICD-10-CM

## 2021-02-04 DIAGNOSIS — R41840 Attention and concentration deficit: Secondary | ICD-10-CM

## 2021-02-04 DIAGNOSIS — M25621 Stiffness of right elbow, not elsewhere classified: Secondary | ICD-10-CM

## 2021-02-04 NOTE — Therapy (Signed)
[facility] [address] [place], [place], [address] Phone: [phone]   Fax:  [phone]  Occupational Therapy Treatment  Patient Details  Name: Trevor Anderson MRN: [id number] Date of Birth: March 14, 1956 Referring Provider (OT): Noel Gerold, NP   Encounter Date: 02/04/2021   OT End of Session - 02/04/21 0950     Visit Number 6    Number of Visits 15    Date for OT Re-Evaluation 03/23/21    Authorization Type [place]    Authorization Time Period 15 visits approved 01/12/21 - 04/27/21    Authorization - Visit Number 6    Authorization - Number of Visits 15    Progress Note Due on Visit 10    OT Start Time 0937    OT Stop Time 1015    OT Time Calculation (min) 38 min    Activity Tolerance Patient tolerated treatment well;Patient limited by pain    Behavior During Therapy [facility] for tasks assessed/performed             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   CONGESTIVE HEART FAILURE 11/12/2007   DIABETES MELLITUS, TYPE II 11/20/2008   HYPERLIPIDEMIA 11/12/2007   HYPERTENSION 11/12/2007   TACHYCARDIA 01/24/2010    Past Surgical History:  Procedure Laterality Date   BUBBLE STUDY  03/16/2020   Procedure: BUBBLE STUDY;  Surgeon: Jodelle Red, MD;  Location: [facility] ENDOSCOPY;  Service: Cardiovascular;;   LOOP RECORDER INSERTION N/A 03/23/2020   Procedure: LOOP RECORDER INSERTION;  Surgeon: Hillis Range, MD;  Location: [facility] INVASIVE CV LAB;  Service: Cardiovascular;  Laterality: N/A;   TEE WITHOUT CARDIOVERSION N/A 03/16/2020   Procedure: TRANSESOPHAGEAL ECHOCARDIOGRAM (TEE);  Surgeon: Jodelle Red, MD;  Location: [facility] ENDOSCOPY;  Service: Cardiovascular;  Laterality: N/A;   TONSILLECTOMY  1970    There were no vitals filed for this visit.   Self PROM and Manual PROM to RUE for forward reaching at table, supination, wrist extension and composite flexion and extension  in RUE hand.  Pt is benefitting from custom resting hand splint for RUE. Adjustments made to splint for new strap on thumb and aded padding for thumb.  Max A for transfers w/c <> mat  NMR to RUE with some weight bearing into RUE and max A For facilitating forward reaching and scap retraction.                        OT Short Term Goals - 01/27/21 1848       OT SHORT TERM GOAL #1   Title Pt and caregivers will be independent with HEP    Time 4    Period Weeks    Status New    Target Date 02/09/21      OT SHORT TERM GOAL #2   Title Pt will complete UB dressing with min A for pull over short sleeve shirt.    Baseline mod A    Time 4    Period Weeks    Status New      OT SHORT TERM GOAL #3   Title Pt and caregiver will verbalize understanding of adapted strategies and/or equipment for completing toilet and tub transfers with mod A consistently.    Baseline currently mod/max and not getting to shower.    Time 4    Period Weeks    Status New      OT SHORT TERM GOAL #4   Title Pt will  achieve 90* of PROM for RUE shoulder flexion to reduce risk of malpositioning and increased pain.    Time 4    Period Weeks    Status New      OT SHORT TERM GOAL #5   Title Pt and caregivers will verbalize and demonstrate understanding of wear and care of any splints and/or orthoses PRN.    Time 4    Period Weeks    Status New               OT Long Term Goals - 01/27/21 1848       OT LONG TERM GOAL #1   Title Pt and caregivers will be independent with and updated HEPs    Time 10    Period Weeks    Status New      OT LONG TERM GOAL #2   Title Pt will complete toilet and tub transfers consistently with min assistance with adapted strategies and equipment PRN    Baseline mod A for stand pivot    Time 10    Period Weeks    Status New      OT LONG TERM GOAL #3   Title Pt will complete bathing with min A    Baseline mod-max    Time 10    Period Weeks     Status New      OT LONG TERM GOAL #4   Title Pt will demonstrate composite flexion/extension of 15% or greater in RUE for preparing for active grasp/release and increasing functional use.    Baseline trace extension    Time 10    Period Weeks    Status New      OT LONG TERM GOAL #5   Title Pt will achieve -20 degrees of elbow extension in RUE of PROM or decreasing risk of skin breakdown    Baseline -40*    Time 10    Period Weeks    Status New                   Plan - 02/04/21 1104     Clinical Impression Statement Pt having good response to splint - adjustments made. Pt continues to progress towards goals. Pt to receive referral to PM&R and possible botox.    OT Occupational Profile and History Problem Focused Assessment - Including review of records relating to presenting problem    Occupational performance deficits (Please refer to evaluation for details): IADL's;ADL's;Leisure    Body Structure / Function / Physical Skills Tone;Strength;ADL;FMC;Mobility;GMC;ROM;IADL;UE functional use;Decreased knowledge of use of DME    Cognitive Skills Problem Solve    Rehab Potential Good    Clinical Decision Making Limited treatment options, no task modification necessary    Comorbidities Affecting Occupational Performance: None    Modification or Assistance to Complete Evaluation  No modification of tasks or assist necessary to complete eval    OT Frequency 2x / week    OT Duration Other (comment)   15 visits over 10 weeks d/t any scheduling conflicts.   OT Treatment/Interventions Self-care/ADL training;Manual Therapy;Patient/family education;Electrical Stimulation;Neuromuscular education;Functional Mobility Training;Passive range of motion;Cognitive remediation/compensation;Therapeutic exercise;Moist Heat;DME and/or AE instruction;Therapeutic activities;Aquatic Therapy;Splinting    Plan Postural control - needs to work on forward weight shifts for transfers, need to orient to midline,   make any adjustments to splint PRN, supine manual therapy to RUE shoulder and get supine stretches (self PROM)    OT Home Exercise Plan self PROM    Consulted and Agree  with Plan of Care Patient;Family member/caregiver    Family Member Consulted son, Myshawn             Patient will benefit from skilled therapeutic intervention in order to improve the following deficits and impairments:   Body Structure / Function / Physical Skills: Tone, Strength, ADL, FMC, Mobility, GMC, ROM, IADL, UE functional use, Decreased knowledge of use of DME Cognitive Skills: Problem Solve     Visit Diagnosis: Abnormal posture  Hemiplegia and hemiparesis following cerebral infarction affecting right dominant side (HCC)  Other lack of coordination  Unsteadiness on feet  Attention and concentration deficit  Stiffness of right elbow, not elsewhere classified  Stiffness of right shoulder, not elsewhere classified  Muscle weakness (generalized)    Problem List Patient Active Problem List   Diagnosis Date Noted   Cerebrovascular accident (CVA) (HCC)    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   Hypertensive urgency 03/12/2020   Acute on chronic systolic CHF (congestive heart failure) (HCC) 03/12/2020   AKI (acute kidney injury) (HCC) 03/12/2020   Fall at home, initial encounter 03/12/2020   Right hip pain 03/12/2020   Hypokalemia 03/12/2020   Coronary artery disease involving native coronary artery of native heart without angina pectoris 09/07/2014   Renal insufficiency 12/02/2012   Preventative health care 11/13/2010   Secondary cardiomyopathy (HCC) 01/24/2010   TACHYCARDIA 01/24/2010   Type 2 diabetes mellitus without complication, with long-term current use of insulin (HCC) 11/20/2008   Mixed hyperlipidemia 11/12/2007   Essential hypertension 11/12/2007   CONGESTIVE HEART FAILURE 11/12/2007    Junious Dresser, OT/L 02/04/2021, 11:05 AM  [facility]  Rehabilitation [facility] [address] [place], [place], [address] Phone: [phone]   Fax:  [phone]  Name: Trevor Anderson MRN: [id number] Date of Birth: April 29, 1955

## 2021-02-07 ENCOUNTER — Encounter: Payer: Self-pay | Admitting: Occupational Therapy

## 2021-02-07 ENCOUNTER — Ambulatory Visit: Payer: No Typology Code available for payment source | Admitting: Occupational Therapy

## 2021-02-07 ENCOUNTER — Other Ambulatory Visit: Payer: Self-pay

## 2021-02-07 DIAGNOSIS — R278 Other lack of coordination: Secondary | ICD-10-CM

## 2021-02-07 DIAGNOSIS — M6281 Muscle weakness (generalized): Secondary | ICD-10-CM

## 2021-02-07 DIAGNOSIS — G463 Brain stem stroke syndrome: Secondary | ICD-10-CM | POA: Diagnosis not present

## 2021-02-07 DIAGNOSIS — M25611 Stiffness of right shoulder, not elsewhere classified: Secondary | ICD-10-CM

## 2021-02-07 DIAGNOSIS — M25621 Stiffness of right elbow, not elsewhere classified: Secondary | ICD-10-CM

## 2021-02-07 DIAGNOSIS — R2681 Unsteadiness on feet: Secondary | ICD-10-CM

## 2021-02-07 DIAGNOSIS — R41840 Attention and concentration deficit: Secondary | ICD-10-CM

## 2021-02-07 DIAGNOSIS — I69351 Hemiplegia and hemiparesis following cerebral infarction affecting right dominant side: Secondary | ICD-10-CM

## 2021-02-07 NOTE — Therapy (Signed)
[facility] [address] [place], [place], [address] Phone: [phone]   Fax:  [phone]  Occupational Therapy Treatment  Patient Details  Name: Trevor Anderson MRN: [id number] Date of Birth: 01-31-1956 Referring Provider (OT): Noel Gerold, NP   Encounter Date: 02/07/2021   OT End of Session - 02/07/21 1228     Visit Number 7    Number of Visits 15    Date for OT Re-Evaluation 03/23/21    Authorization Type [place]    Authorization Time Period 15 visits approved 01/12/21 - 04/27/21    Authorization - Visit Number 7    Authorization - Number of Visits 15    Progress Note Due on Visit 10    OT Start Time 1229    OT Stop Time 1315    OT Time Calculation (min) 46 min    Activity Tolerance Patient tolerated treatment well;Patient limited by pain    Behavior During Therapy [facility] for tasks assessed/performed             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   CONGESTIVE HEART FAILURE 11/12/2007   DIABETES MELLITUS, TYPE II 11/20/2008   HYPERLIPIDEMIA 11/12/2007   HYPERTENSION 11/12/2007   TACHYCARDIA 01/24/2010    Past Surgical History:  Procedure Laterality Date   BUBBLE STUDY  03/16/2020   Procedure: BUBBLE STUDY;  Surgeon: Jodelle Red, MD;  Location: [facility] ENDOSCOPY;  Service: Cardiovascular;;   LOOP RECORDER INSERTION N/A 03/23/2020   Procedure: LOOP RECORDER INSERTION;  Surgeon: Hillis Range, MD;  Location: [facility] INVASIVE CV LAB;  Service: Cardiovascular;  Laterality: N/A;   TEE WITHOUT CARDIOVERSION N/A 03/16/2020   Procedure: TRANSESOPHAGEAL ECHOCARDIOGRAM (TEE);  Surgeon: Jodelle Red, MD;  Location: [facility] ENDOSCOPY;  Service: Cardiovascular;  Laterality: N/A;   TONSILLECTOMY  1970    There were no vitals filed for this visit.   Subjective Assessment - 02/07/21 1230     Subjective  Pt denies any pain "not yet anyway"    Patient is accompanied by: --    caregiver   Pertinent History HLD, HTN, DM, CHF    Limitations Fall Risk. Mod stand pivot transfer w RW    Patient Stated Goals to move it (RUE)    Currently in Pain? No/denies              Manual Therapy RUE wrist and hand and elbow  Anterior lean  while in sitting for increased anterior positioning to assist with increased ease and independence with transfers. Pt worked on weight bearing into RUE while reaching forward with LUE to place resistance clothespins (yellow and red) on antenna and then removing with RUE on chair for increased shoulder flexion prolonged stretch.   Transfers max to total A stand pivot without AD - w/c<>edge of mat  Splint Adjustments for replacing straps d/t wear and tear.                       OT Short Term Goals - 02/07/21 1231       OT SHORT TERM GOAL #1   Title Pt and caregivers will be independent with HEP    Time 4    Period Weeks    Status On-going    Target Date 02/09/21      OT SHORT TERM GOAL #2   Title Pt will complete UB dressing with min A for pull over short sleeve shirt.    Baseline mod A  Time 4    Period Weeks    Status On-going      OT SHORT TERM GOAL #3   Title Pt and caregiver will verbalize understanding of adapted strategies and/or equipment for completing toilet and tub transfers with mod A consistently.    Baseline currently mod/max and not getting to shower.    Time 4    Period Weeks    Status New      OT SHORT TERM GOAL #4   Title Pt will achieve 90* of PROM for RUE shoulder flexion to reduce risk of malpositioning and increased pain.    Time 4    Period Weeks    Status New      OT SHORT TERM GOAL #5   Title Pt and caregivers will verbalize and demonstrate understanding of wear and care of any splints and/or orthoses PRN.    Time 4    Period Weeks    Status Achieved               OT Long Term Goals - 01/27/21 1848       OT LONG TERM GOAL #1   Title Pt and caregivers will be  independent with and updated HEPs    Time 10    Period Weeks    Status New      OT LONG TERM GOAL #2   Title Pt will complete toilet and tub transfers consistently with min assistance with adapted strategies and equipment PRN    Baseline mod A for stand pivot    Time 10    Period Weeks    Status New      OT LONG TERM GOAL #3   Title Pt will complete bathing with min A    Baseline mod-max    Time 10    Period Weeks    Status New      OT LONG TERM GOAL #4   Title Pt will demonstrate composite flexion/extension of 15% or greater in RUE for preparing for active grasp/release and increasing functional use.    Baseline trace extension    Time 10    Period Weeks    Status New      OT LONG TERM GOAL #5   Title Pt will achieve -20 degrees of elbow extension in RUE of PROM or decreasing risk of skin breakdown    Baseline -40*    Time 10    Period Weeks    Status New                   Plan - 02/07/21 1316     Clinical Impression Statement Pt with increased anterior lean but continues to be limited d/t hip range of motion today impeding overall independence with transfers. Continue to work towards goals.    OT Occupational Profile and History Problem Focused Assessment - Including review of records relating to presenting problem    Occupational performance deficits (Please refer to evaluation for details): IADL's;ADL's;Leisure    Body Structure / Function / Physical Skills Tone;Strength;ADL;FMC;Mobility;GMC;ROM;IADL;UE functional use;Decreased knowledge of use of DME    Cognitive Skills Problem Solve    Rehab Potential Good    Clinical Decision Making Limited treatment options, no task modification necessary    Comorbidities Affecting Occupational Performance: None    Modification or Assistance to Complete Evaluation  No modification of tasks or assist necessary to complete eval    OT Frequency 2x / week    OT Duration Other (comment)  15 visits over 10 weeks d/t any  scheduling conflicts.   OT Treatment/Interventions Self-care/ADL training;Manual Therapy;Patient/family education;Electrical Stimulation;Neuromuscular education;Functional Mobility Training;Passive range of motion;Cognitive remediation/compensation;Therapeutic exercise;Moist Heat;DME and/or AE instruction;Therapeutic activities;Aquatic Therapy;Splinting    Plan Postural control - needs to work on forward weight shifts for transfers, need to orient to midline,  make any adjustments to splint PRN, supine manual therapy to RUE shoulder and get supine stretches (self PROM)    OT Home Exercise Plan self PROM    Consulted and Agree with Plan of Care Patient;Family member/caregiver    Family Member Consulted son, Daily             Patient will benefit from skilled therapeutic intervention in order to improve the following deficits and impairments:   Body Structure / Function / Physical Skills: Tone, Strength, ADL, FMC, Mobility, GMC, ROM, IADL, UE functional use, Decreased knowledge of use of DME Cognitive Skills: Problem Solve     Visit Diagnosis: Muscle weakness (generalized)  Hemiplegia and hemiparesis following cerebral infarction affecting right dominant side (HCC)  Other lack of coordination  Unsteadiness on feet  Attention and concentration deficit  Stiffness of right elbow, not elsewhere classified  Stiffness of right shoulder, not elsewhere classified    Problem List Patient Active Problem List   Diagnosis Date Noted   Cerebrovascular accident (CVA) (HCC)    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   Hypertensive urgency 03/12/2020   Acute on chronic systolic CHF (congestive heart failure) (HCC) 03/12/2020   AKI (acute kidney injury) (HCC) 03/12/2020   Fall at home, initial encounter 03/12/2020   Right hip pain 03/12/2020   Hypokalemia 03/12/2020   Coronary artery disease involving native coronary artery of native heart without angina pectoris  09/07/2014   Renal insufficiency 12/02/2012   Preventative health care 11/13/2010   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 [place], [place], [address] Phone: [phone]   Fax:  [phone]  Name: Trevor Anderson MRN: [id number] Date of Birth: 1956-02-09

## 2021-02-09 ENCOUNTER — Other Ambulatory Visit: Payer: Self-pay

## 2021-02-09 ENCOUNTER — Ambulatory Visit: Payer: No Typology Code available for payment source | Admitting: Occupational Therapy

## 2021-02-09 ENCOUNTER — Encounter: Payer: Self-pay | Admitting: Occupational Therapy

## 2021-02-09 DIAGNOSIS — R2681 Unsteadiness on feet: Secondary | ICD-10-CM

## 2021-02-09 DIAGNOSIS — I69351 Hemiplegia and hemiparesis following cerebral infarction affecting right dominant side: Secondary | ICD-10-CM

## 2021-02-09 DIAGNOSIS — M6281 Muscle weakness (generalized): Secondary | ICD-10-CM

## 2021-02-09 DIAGNOSIS — M25621 Stiffness of right elbow, not elsewhere classified: Secondary | ICD-10-CM

## 2021-02-09 DIAGNOSIS — R278 Other lack of coordination: Secondary | ICD-10-CM

## 2021-02-09 DIAGNOSIS — G463 Brain stem stroke syndrome: Secondary | ICD-10-CM | POA: Diagnosis not present

## 2021-02-09 DIAGNOSIS — R41840 Attention and concentration deficit: Secondary | ICD-10-CM

## 2021-02-09 DIAGNOSIS — M25611 Stiffness of right shoulder, not elsewhere classified: Secondary | ICD-10-CM

## 2021-02-09 NOTE — Therapy (Signed)
[facility] [address] [place], [place], [address] Phone: [phone]   Fax:  [phone]  Occupational Therapy Treatment  Patient Details  Name: Trevor Anderson MRN: [id number] Date of Birth: [birth year] Referring Provider (OT): Noel Gerold, NP   Encounter Date: 02/09/2021   OT End of Session - 02/09/21 1235     Visit Number 8    Number of Visits 15    Date for OT Re-Evaluation 03/23/21    Authorization Type [place]    Authorization Time Period 15 visits approved 01/12/21 - 04/27/21    Authorization - Visit Number 8    Authorization - Number of Visits 15    Progress Note Due on Visit 10    OT Start Time 1232    OT Stop Time 1311    OT Time Calculation (min) 39 min    Activity Tolerance Patient tolerated treatment well;Patient limited by pain    Behavior During Therapy [facility] for tasks assessed/performed             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   CONGESTIVE HEART FAILURE 11/12/2007   DIABETES MELLITUS, TYPE II 11/20/2008   HYPERLIPIDEMIA 11/12/2007   HYPERTENSION 11/12/2007   TACHYCARDIA 01/24/2010    Past Surgical History:  Procedure Laterality Date   BUBBLE STUDY  03/16/2020   Procedure: BUBBLE STUDY;  Surgeon: Jodelle Red, MD;  Location: [facility] ENDOSCOPY;  Service: Cardiovascular;;   LOOP RECORDER INSERTION N/A 03/23/2020   Procedure: LOOP RECORDER INSERTION;  Surgeon: Hillis Range, MD;  Location: [facility] INVASIVE CV LAB;  Service: Cardiovascular;  Laterality: N/A;   TEE WITHOUT CARDIOVERSION N/A 03/16/2020   Procedure: TRANSESOPHAGEAL ECHOCARDIOGRAM (TEE);  Surgeon: Jodelle Red, MD;  Location: [facility] ENDOSCOPY;  Service: Cardiovascular;  Laterality: N/A;   TONSILLECTOMY  1970    There were no vitals filed for this visit.   Subjective Assessment - 02/09/21 1235     Subjective  "i could use some water"    Patient is accompanied by: --   caregiver    Pertinent History HLD, HTN, DM, CHF    Limitations Fall Risk. Mod stand pivot transfer w RW    Patient Stated Goals to move it (RUE)    Currently in Pain? No/denies              Anterior Lean with leaning off back of tilt in space wheelchair and increased core strength. Pt demonstrates ability to maintain upright seated posture in chair off back for approx 8 minutes before req'ing break. Pt worked on reaching to left with LUE and placing 1 inch blocks into bowl approximately 2 feet away a various levels. Pt limited with hip flexion range of motion impeding overall transfers and sitting tolerance and balance.   Manual Therapy to RUE wrist, hand, elbow and shoulder. PROM.  AAROM for RUE. Trace composite flexion and elbow extension noted today with working with RUE.   Encouraged patient to keep wrist on trough on RUE for positioning and limiting wrist flexion over side. Demonstrated and verbalized understanding.                      OT Short Term Goals - 02/07/21 1231       OT SHORT TERM GOAL #1   Title Pt and caregivers will be independent with HEP    Time 4    Period Weeks    Status On-going    Target Date 02/09/21  OT SHORT TERM GOAL #2   Title Pt will complete UB dressing with min A for pull over short sleeve shirt.    Baseline mod A    Time 4    Period Weeks    Status On-going      OT SHORT TERM GOAL #3   Title Pt and caregiver will verbalize understanding of adapted strategies and/or equipment for completing toilet and tub transfers with mod A consistently.    Baseline currently mod/max and not getting to shower.    Time 4    Period Weeks    Status New      OT SHORT TERM GOAL #4   Title Pt will achieve 90* of PROM for RUE shoulder flexion to reduce risk of malpositioning and increased pain.    Time 4    Period Weeks    Status New      OT SHORT TERM GOAL #5   Title Pt and caregivers will verbalize and demonstrate understanding of wear and  care of any splints and/or orthoses PRN.    Time 4    Period Weeks    Status Achieved               OT Long Term Goals - 01/27/21 1848       OT LONG TERM GOAL #1   Title Pt and caregivers will be independent with and updated HEPs    Time 10    Period Weeks    Status New      OT LONG TERM GOAL #2   Title Pt will complete toilet and tub transfers consistently with min assistance with adapted strategies and equipment PRN    Baseline mod A for stand pivot    Time 10    Period Weeks    Status New      OT LONG TERM GOAL #3   Title Pt will complete bathing with min A    Baseline mod-max    Time 10    Period Weeks    Status New      OT LONG TERM GOAL #4   Title Pt will demonstrate composite flexion/extension of 15% or greater in RUE for preparing for active grasp/release and increasing functional use.    Baseline trace extension    Time 10    Period Weeks    Status New      OT LONG TERM GOAL #5   Title Pt will achieve -20 degrees of elbow extension in RUE of PROM or decreasing risk of skin breakdown    Baseline -40*    Time 10    Period Weeks    Status New                   Plan - 02/09/21 1330     Clinical Impression Statement Pt continues to progress towards goals- coninue to work on increasing anterior lean and positioning to increase independence.    OT Occupational Profile and History Problem Focused Assessment - Including review of records relating to presenting problem    Occupational performance deficits (Please refer to evaluation for details): IADL's;ADL's;Leisure    Body Structure / Function / Physical Skills Tone;Strength;ADL;FMC;Mobility;GMC;ROM;IADL;UE functional use;Decreased knowledge of use of DME    Cognitive Skills Problem Solve    Rehab Potential Good    Clinical Decision Making Limited treatment options, no task modification necessary    Comorbidities Affecting Occupational Performance: None    Modification or Assistance to Complete  Evaluation  No modification  of tasks or assist necessary to complete eval    OT Frequency 2x / week    OT Duration Other (comment)   15 visits over 10 weeks d/t any scheduling conflicts.   OT Treatment/Interventions Self-care/ADL training;Manual Therapy;Patient/family education;Electrical Stimulation;Neuromuscular education;Functional Mobility Training;Passive range of motion;Cognitive remediation/compensation;Therapeutic exercise;Moist Heat;DME and/or AE instruction;Therapeutic activities;Aquatic Therapy;Splinting    Plan Postural control - needs to work on forward weight shifts for transfers, need to orient to midline,  make any adjustments to splint PRN, supine manual therapy to RUE shoulder and get supine stretches (self PROM)    OT Home Exercise Plan self PROM    Consulted and Agree with Plan of Care Patient;Family member/caregiver    Family Member Consulted son, [place]             Patient will benefit from skilled therapeutic intervention in order to improve the following deficits and impairments:   Body Structure / Function / Physical Skills: Tone, Strength, ADL, FMC, Mobility, GMC, ROM, IADL, UE functional use, Decreased knowledge of use of DME Cognitive Skills: Problem Solve     Visit Diagnosis: Muscle weakness (generalized)  Hemiplegia and hemiparesis following cerebral infarction affecting right dominant side (HCC)  Other lack of coordination  Unsteadiness on feet  Attention and concentration deficit  Stiffness of right elbow, not elsewhere classified  Stiffness of right shoulder, not elsewhere classified    Problem List Patient Active Problem List   Diagnosis Date Noted   Cerebrovascular accident (CVA) (HCC)    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   Hypertensive urgency 03/12/2020   Acute on chronic systolic CHF (congestive heart failure) (HCC) 03/12/2020   AKI (acute kidney injury) (HCC) 03/12/2020   Fall at home, initial encounter  03/12/2020   Right hip pain 03/12/2020   Hypokalemia 03/12/2020   Coronary artery disease involving native coronary artery of native heart without angina pectoris 09/07/2014   Renal insufficiency 12/02/2012   Preventative health care 11/13/2010   Secondary cardiomyopathy (HCC) 01/24/2010   TACHYCARDIA 01/24/2010   Type 2 diabetes mellitus without complication, with long-term current use of insulin (HCC) 11/20/2008   Mixed hyperlipidemia 11/12/2007   Essential hypertension 11/12/2007   CONGESTIVE HEART FAILURE 11/12/2007    Junious Dresser, OT/L 02/09/2021, 1:32 PM  [facility] [address] [place], [place], [address] Phone: [phone]   Fax:  [phone]  Name: DEUNDRA BARD MRN: [id number] Date of Birth: [birth year]

## 2021-02-14 ENCOUNTER — Ambulatory Visit: Payer: No Typology Code available for payment source

## 2021-02-14 ENCOUNTER — Other Ambulatory Visit: Payer: Self-pay

## 2021-02-14 ENCOUNTER — Ambulatory Visit: Payer: No Typology Code available for payment source | Admitting: Occupational Therapy

## 2021-02-14 ENCOUNTER — Encounter: Payer: Self-pay | Admitting: Occupational Therapy

## 2021-02-14 DIAGNOSIS — M6281 Muscle weakness (generalized): Secondary | ICD-10-CM

## 2021-02-14 DIAGNOSIS — M25621 Stiffness of right elbow, not elsewhere classified: Secondary | ICD-10-CM

## 2021-02-14 DIAGNOSIS — R2681 Unsteadiness on feet: Secondary | ICD-10-CM

## 2021-02-14 DIAGNOSIS — R293 Abnormal posture: Secondary | ICD-10-CM

## 2021-02-14 DIAGNOSIS — I69351 Hemiplegia and hemiparesis following cerebral infarction affecting right dominant side: Secondary | ICD-10-CM

## 2021-02-14 DIAGNOSIS — R41840 Attention and concentration deficit: Secondary | ICD-10-CM

## 2021-02-14 DIAGNOSIS — R278 Other lack of coordination: Secondary | ICD-10-CM

## 2021-02-14 DIAGNOSIS — M25611 Stiffness of right shoulder, not elsewhere classified: Secondary | ICD-10-CM

## 2021-02-14 DIAGNOSIS — G463 Brain stem stroke syndrome: Secondary | ICD-10-CM | POA: Diagnosis not present

## 2021-02-14 NOTE — Therapy (Signed)
[facility] [address] [place], [place], [address] Phone: [phone]   Fax:  [phone]  Occupational Therapy Treatment  Patient Details  Name: Trevor Anderson MRN: [id number] Date of Birth: Mar 27, 1956 Referring Provider (OT): Noel Gerold, NP   Encounter Date: 02/14/2021   OT End of Session - 02/14/21 1321     Visit Number 9    Number of Visits 15    Date for OT Re-Evaluation 03/23/21    Authorization Type [place]    Authorization Time Period 15 visits approved 01/12/21 - 04/27/21    Authorization - Visit Number 9    Authorization - Number of Visits 15    Progress Note Due on Visit 10    OT Start Time 1318    OT Stop Time 1400    OT Time Calculation (min) 42 min    Activity Tolerance Patient tolerated treatment well;Patient limited by pain    Behavior During Therapy [facility] for tasks assessed/performed             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   CONGESTIVE HEART FAILURE 11/12/2007   DIABETES MELLITUS, TYPE II 11/20/2008   HYPERLIPIDEMIA 11/12/2007   HYPERTENSION 11/12/2007   TACHYCARDIA 01/24/2010    Past Surgical History:  Procedure Laterality Date   BUBBLE STUDY  03/16/2020   Procedure: BUBBLE STUDY;  Surgeon: Jodelle Red, MD;  Location: [facility] ENDOSCOPY;  Service: Cardiovascular;;   LOOP RECORDER INSERTION N/A 03/23/2020   Procedure: LOOP RECORDER INSERTION;  Surgeon: Hillis Range, MD;  Location: [facility] INVASIVE CV LAB;  Service: Cardiovascular;  Laterality: N/A;   TEE WITHOUT CARDIOVERSION N/A 03/16/2020   Procedure: TRANSESOPHAGEAL ECHOCARDIOGRAM (TEE);  Surgeon: Jodelle Red, MD;  Location: [facility] ENDOSCOPY;  Service: Cardiovascular;  Laterality: N/A;   TONSILLECTOMY  1970    There were no vitals filed for this visit.   Subjective Assessment - 02/14/21 1320     Subjective  "bad - my [employer] lost"    Patient is accompanied by: --    caregiver   Pertinent History HLD, HTN, DM, CHF    Limitations Fall Risk. Mod stand pivot transfer w RW    Patient Stated Goals to move it (RUE)    Currently in Pain? Yes    Pain Score 3     Pain Location Shoulder    Pain Orientation Left    Pain Descriptors / Indicators Aching    Pain Type Acute pain    Pain Onset Today    Pain Frequency Intermittent              Transfers worked on several different transfers to both sides for increasing independence and problem solving for increasing independence. Pt transferred with squat pivot with max A to left, total A to right and on SB with mod A to left.   Weightbearing into RUE on pebble with total A for maintaining hand position. Pt worked on reaching forward and to left with LUE for increasing weight bearing position and wrist extension.   Manual Therapy to RUE scap and wrist and hand.  Bed Mobility sit>supine with max assistance and management of legs.                         OT Short Term Goals - 02/14/21 1337       OT SHORT TERM GOAL #1   Title Pt and caregivers will be independent with HEP  Time 4    Period Weeks    Status On-going    Target Date 02/09/21      OT SHORT TERM GOAL #2   Title Pt will complete UB dressing with min A for pull over short sleeve shirt.    Baseline mod A    Time 4    Period Weeks    Status On-going   per pt report - completing with min A for hemi arm. 02/14/21 complete in clinic to mark achieved     OT SHORT TERM GOAL #3   Title Pt and caregiver will verbalize understanding of adapted strategies and/or equipment for completing toilet and tub transfers with mod A consistently.    Baseline currently mod/max and not getting to shower.    Time 4    Period Weeks    Status New      OT SHORT TERM GOAL #4   Title Pt will achieve 90* of PROM for RUE shoulder flexion to reduce risk of malpositioning and increased pain.    Time 4    Period Weeks    Status New      OT  SHORT TERM GOAL #5   Title Pt and caregivers will verbalize and demonstrate understanding of wear and care of any splints and/or orthoses PRN.    Time 4    Period Weeks    Status Achieved               OT Long Term Goals - 01/27/21 1848       OT LONG TERM GOAL #1   Title Pt and caregivers will be independent with and updated HEPs    Time 10    Period Weeks    Status New      OT LONG TERM GOAL #2   Title Pt will complete toilet and tub transfers consistently with min assistance with adapted strategies and equipment PRN    Baseline mod A for stand pivot    Time 10    Period Weeks    Status New      OT LONG TERM GOAL #3   Title Pt will complete bathing with min A    Baseline mod-max    Time 10    Period Weeks    Status New      OT LONG TERM GOAL #4   Title Pt will demonstrate composite flexion/extension of 15% or greater in RUE for preparing for active grasp/release and increasing functional use.    Baseline trace extension    Time 10    Period Weeks    Status New      OT LONG TERM GOAL #5   Title Pt will achieve -20 degrees of elbow extension in RUE of PROM or decreasing risk of skin breakdown    Baseline -40*    Time 10    Period Weeks    Status New                   Plan - 02/14/21 1531     Clinical Impression Statement Pt with limited hip flexion impeding independence with transfers. Pt would benefit from Botox for RUE spasticity.    OT Occupational Profile and History Problem Focused Assessment - Including review of records relating to presenting problem    Occupational performance deficits (Please refer to evaluation for details): IADL's;ADL's;Leisure    Body Structure / Function / Physical Skills Tone;Strength;ADL;FMC;Mobility;GMC;ROM;IADL;UE functional use;Decreased knowledge of use of DME    Cognitive Skills Problem  Solve    Rehab Potential Good    Clinical Decision Making Limited treatment options, no task modification necessary     Comorbidities Affecting Occupational Performance: None    Modification or Assistance to Complete Evaluation  No modification of tasks or assist necessary to complete eval    OT Frequency 2x / week    OT Duration Other (comment)   15 visits over 10 weeks d/t any scheduling conflicts.   OT Treatment/Interventions Self-care/ADL training;Manual Therapy;Patient/family education;Electrical Stimulation;Neuromuscular education;Functional Mobility Training;Passive range of motion;Cognitive remediation/compensation;Therapeutic exercise;Moist Heat;DME and/or AE instruction;Therapeutic activities;Aquatic Therapy;Splinting    Plan Postural control - needs to work on forward weight shifts for transfers, need to orient to midline,  make any adjustments to splint PRN, supine manual therapy to RUE shoulder and get supine stretches (self PROM)    OT Home Exercise Plan self PROM    Consulted and Agree with Plan of Care Patient;Family member/caregiver    Family Member Consulted son, [place]             Patient will benefit from skilled therapeutic intervention in order to improve the following deficits and impairments:   Body Structure / Function / Physical Skills: Tone, Strength, ADL, FMC, Mobility, GMC, ROM, IADL, UE functional use, Decreased knowledge of use of DME Cognitive Skills: Problem Solve     Visit Diagnosis: Muscle weakness (generalized)  Hemiplegia and hemiparesis following cerebral infarction affecting right dominant side (HCC)  Other lack of coordination  Unsteadiness on feet  Attention and concentration deficit  Stiffness of right elbow, not elsewhere classified  Stiffness of right shoulder, not elsewhere classified    Problem List Patient Active Problem List   Diagnosis Date Noted   Cerebrovascular accident (CVA) (HCC)    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   Hypertensive urgency 03/12/2020   Acute on chronic systolic CHF (congestive heart failure) (HCC)  03/12/2020   AKI (acute kidney injury) (HCC) 03/12/2020   Fall at home, initial encounter 03/12/2020   Right hip pain 03/12/2020   Hypokalemia 03/12/2020   Coronary artery disease involving native coronary artery of native heart without angina pectoris 09/07/2014   Renal insufficiency 12/02/2012   Preventative health care 11/13/2010   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

## 2021-02-14 NOTE — Therapy (Signed)
[facility] [address] [place], [place], [address] Phone: [phone]   Fax:  [phone]  Physical Therapy Treatment  Patient Details  Name: Trevor Anderson MRN: [id number] Date of Birth: Nov 09, 1955 Referring Provider (PT): Deland Pretty   Encounter Date: 02/14/2021   PT End of Session - 02/14/21 1403     Visit Number 16    Number of Visits 28    Date for PT Re-Evaluation 03/04/21    Authorization Type VA auth# [id number] (15 visits); VA has approved additional 15 PT visits to 05/26/2021    Authorization - Visit Number 1    Authorization - Number of Visits 15    PT Start Time 0962    PT Stop Time 8366    PT Time Calculation (min) 43 min    Equipment Utilized During Treatment Gait belt    Activity Tolerance Patient tolerated treatment well    Behavior During Therapy [facility] for tasks assessed/performed             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   CONGESTIVE HEART FAILURE 11/12/2007   DIABETES MELLITUS, TYPE II 11/20/2008   HYPERLIPIDEMIA 11/12/2007   HYPERTENSION 11/12/2007   TACHYCARDIA 01/24/2010    Past Surgical History:  Procedure Laterality Date   BUBBLE STUDY  03/16/2020   Procedure: BUBBLE STUDY;  Surgeon: Buford Dresser, MD;  Location: [facility];  Service: Cardiovascular;;   LOOP RECORDER INSERTION N/A 03/23/2020   Procedure: LOOP RECORDER INSERTION;  Surgeon: Thompson Grayer, MD;  Location: [facility];  Service: Cardiovascular;  Laterality: N/A;   TEE WITHOUT CARDIOVERSION N/A 03/16/2020   Procedure: TRANSESOPHAGEAL ECHOCARDIOGRAM (TEE);  Surgeon: Buford Dresser, MD;  Location: [facility] ENDOSCOPY;  Service: Cardiovascular;  Laterality: N/A;   TONSILLECTOMY  1970    There were no vitals filed for this visit.   Subjective Assessment - 02/14/21 1404     Subjective No new changes/complaints. No falls. Patient reports he was having  pain but is not having any now.    Limitations Standing    How long can you stand comfortably? 5-10 minutes    Patient Stated Goals Open/close hand; Get more R LE motion;    Currently in Pain? No/denies    Pain Onset Today                               [facility] Adult PT Treatment/Exercise - 02/14/21 0001       Bed Mobility   Bed Mobility Supine to Sit    Supine to Sit Moderate Assistance - Patient 50-74%   verbal cues required and assistance with RLE     Transfers   Transfers Sit to Stand;Stand to Sit    Sit to Stand 3: Mod assist;2: Max assist    Sit to Stand Details Tactile cues for weight shifting;Tactile cues for placement;Verbal cues for technique;Verbal cues for sequencing    Sit to Stand Details (indicate cue type and reason) increased spasticity in RLE requiring increased assistance today, as well as fatigue. PT continuet o provide assistance with scooting forward, cues for forward lean and RLE placement.    Stand to Sit 3: Mod assist;2: Max assist    Stand to Sit Details (indicate cue type and reason) Tactile cues for weight shifting;Verbal cues for sequencing;Verbal cues for technique    Stand to Sit Details cues to reach back    Lateral/Scoot Transfers  4: Min assist    Lateral/Scoot Transfer Details (indicate cue type and reason) completed slide board transfer from mat <> w/c to L Side with Min A. Most assistance required with placement of sliding board. Cues for upright posture.    Comments patient very fatigued post OT require increased assistance with standing activities today. no caregiver present during session, therefore unable to have conversation regarding transfers at home and assist needed at this time.      Therapeutic Activites    Therapeutic Activities Other Therapeutic Activities    Other Therapeutic Activities upon standing at [place], worked on standing tolerance and improved posture/hip extension. Patient able to stand 30 seconds prior to needing  seated rest.      Neuro Re-ed    Neuro Re-ed Details  completed sit <> stand x 2 reps at RW, attempted to use R Hand splint for R walker per OT request but upon standing unable to get hand placed due to spasticity. Reviewed HEP: seated at edge of mat completed forward lean x 10 reps with tactle cues and faciliation from PT provided, completed isometric hip adduction with ball x 10 reps, tactile cues on RLE noted. PT educating on HEP compliance. Seated edge of mat without UE support working on upright posture and reaching across midline x 5 reps. Then worked on sidebend to wedge + pillow and return to upright for improved weight shift onto RLE x 5 reps, and then onto LLE x 5 reps. CGA required with weight shift onto RLE. Cues for abdominal activation and upright posture.                     PT Education - 02/14/21 1811     Education Details progress toward STG; HEP Update    Person(s) Educated Patient    Methods Explanation    Comprehension Verbalized understanding              PT Short Term Goals - 02/14/21 1806       PT SHORT TERM GOAL #1   Title Pt will perform HEP with caregiver assistance, for improved flexibility, strength, transfers.  TARGET 02/04/2021    Baseline reports completing 2-3x/week with caregiver assistance    Time 4    Period Weeks    Status Achieved      PT SHORT TERM GOAL #2   Title Pt will perform sit to stand transfer with mod assist.    Baseline currently max assist with sit<>stand; continue to require max A for transfer on 10/24    Time 4    Period Weeks    Status Not Met      PT SHORT TERM GOAL #3   Title Caregiver will verbalize improved stand pivot transfers from w/c<>mat/bed with mod assist (at least 50% of the time).    Baseline mod/max with sliding board, has just improved to mod assist in clinic 01/06/2021; unable to assess due to caregiver not present at session on 10/24, will address at next session    Time 4    Period Weeks     Status On-going               PT Long Term Goals - 01/12/21 9622       PT LONG TERM GOAL #1   Title Pt will be able to perform progression of/finalized HEP with caregiver assist,  TARGET 03/04/21    Baseline has initial HEP and will benefit from continued progression of HEP  Time 8    Period Weeks    Status Revised      PT LONG TERM GOAL #2   Title Pt will be able to perform all edge of bed scooting with min A for improved ease of transfers, decreased caregiver burden    Baseline 01/07/21: pt continues to need cues and up to min to mod  assist, improved from mod/max assist    Time 8    Period Weeks    Status Revised      PT LONG TERM GOAL #3   Title Caregiver will report standing at sink or RW at home, for at least 5 minutes, at least 5 days per week, with min guard>supervision, for improved standing tolerance for ADL participation.    Baseline 01/07/21: min assist in PT session    Time 8    Period Weeks    Status Revised      PT LONG TERM GOAL #4   Title Pt will be able to perform wheelchair transfer with min A for improved independence, decreased caregiver burden    Baseline 01/07/21: mod assist of one person with RW for stand pivot transfers. improved from mod/max assist with slide board transfer    Time 8    Period Weeks    Status Revised      PT LONG TERM GOAL #5   Title Pt/family/caregiver will verbalize plans for continued community fitness upon d/c from PT to maximize gains made in therapy.    Time 8    Period Weeks    Status New                   Plan - 02/14/21 1808     Clinical Impression Statement Patient returns to PT after being on hold until authorization recieved. Assessed patient's progress toward STG, with patient able to meet STG #1. Patient demo increased spasticity in RLE/RUE today limiting standing tolerance and increased assitance required (Max A) with sit <> stand today. Trialed R Hand Orthotic for RW but unable to get hand properly  placed at this time. Reviewed HEP with patietn and educating on compliance. No caregiver present at session today. Patient will benefit from continued skilled PT services.    Personal Factors and Comorbidities Age;Time since onset of injury/illness/exacerbation    Examination-Activity Limitations Locomotion Level;Transfers;Sit;Stand;Toileting;Dressing;Hygiene/Grooming;Self Feeding;Bed Mobility;Bend;Bathing    Examination-Participation Restrictions Community Activity;Shop;Meal Prep;Yard Work    Stability/Clinical Decision Making Evolving/Moderate complexity    Rehab Potential Fair    PT Frequency 2x / week    PT Duration 8 weeks   per recert after 0/97/35 visit   PT Treatment/Interventions ADLs/Self Care Home Management;Electrical Stimulation;DME Instruction;Gait training;Functional mobility training;Therapeutic activities;Therapeutic exercise;Balance training;Neuromuscular re-education;Manual techniques;Patient/family education;Orthotic Fit/Training;Wheelchair mobility training;Passive range of motion;Dry needling;Taping    PT Next Visit Plan Try standing in parallel bars or at sink to increase standing time and UE support for standing balance activities.  continue to work on stand pivot transfers with RW, standing balance with RW, pre gait activities. LE stretching to assist with tone/spasticity management   PT to request additional VA auth   PT Home Exercise Plan Forward lean x20 with caregiver; standing 3x daily for at least 30 sec with caregiver; [place] 12/29/20    Consulted and Agree with Plan of Care Patient;Family member/caregiver    Family Member Consulted Son             Patient will benefit from skilled therapeutic intervention in order to improve the following deficits and impairments:  Abnormal gait, Difficulty walking, Impaired tone, Decreased range of motion, Decreased coordination, Decreased endurance, Impaired UE functional use, Decreased activity tolerance, Pain, Decreased  balance, Decreased mobility, Decreased strength, Impaired sensation, Postural dysfunction  Visit Diagnosis: Muscle weakness (generalized)  Unsteadiness on feet  Abnormal posture     Problem List Patient Active Problem List   Diagnosis Date Noted   Cerebrovascular accident (CVA) ([facility])    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   Hypertensive urgency 03/12/2020   Acute on chronic systolic CHF (congestive heart failure) ([facility]) 03/12/2020   AKI (acute kidney injury) ([facility]) 03/12/2020   Fall at home, initial encounter 03/12/2020   Right hip pain 03/12/2020   Hypokalemia 03/12/2020   Coronary artery disease involving native coronary artery of native heart without angina pectoris 09/07/2014   Renal insufficiency 12/02/2012   Preventative health care 11/13/2010   Secondary cardiomyopathy ([facility]) 01/24/2010   TACHYCARDIA 01/24/2010   Type 2 diabetes mellitus without complication, with long-term current use of insulin ([facility]) 11/20/2008   Mixed hyperlipidemia 11/12/2007   Essential hypertension 11/12/2007   CONGESTIVE HEART FAILURE 11/12/2007    Jones Bales, PT, DPT 02/14/2021, 6:14 PM  [facility] [address] [place], [place], [address] Phone: [phone]   Fax:  [phone]  Name: REA RESER MRN: [id number] Date of Birth: 04-12-1956

## 2021-02-18 ENCOUNTER — Encounter: Payer: Self-pay | Admitting: Physical Therapy

## 2021-02-18 ENCOUNTER — Ambulatory Visit: Payer: No Typology Code available for payment source | Admitting: Occupational Therapy

## 2021-02-18 ENCOUNTER — Ambulatory Visit: Payer: No Typology Code available for payment source | Admitting: Physical Therapy

## 2021-02-18 ENCOUNTER — Other Ambulatory Visit: Payer: Self-pay

## 2021-02-18 DIAGNOSIS — R41840 Attention and concentration deficit: Secondary | ICD-10-CM

## 2021-02-18 DIAGNOSIS — R278 Other lack of coordination: Secondary | ICD-10-CM

## 2021-02-18 DIAGNOSIS — M6281 Muscle weakness (generalized): Secondary | ICD-10-CM

## 2021-02-18 DIAGNOSIS — M25611 Stiffness of right shoulder, not elsewhere classified: Secondary | ICD-10-CM

## 2021-02-18 DIAGNOSIS — R2681 Unsteadiness on feet: Secondary | ICD-10-CM

## 2021-02-18 DIAGNOSIS — G463 Brain stem stroke syndrome: Secondary | ICD-10-CM | POA: Diagnosis not present

## 2021-02-18 DIAGNOSIS — M25621 Stiffness of right elbow, not elsewhere classified: Secondary | ICD-10-CM

## 2021-02-18 DIAGNOSIS — R293 Abnormal posture: Secondary | ICD-10-CM

## 2021-02-18 DIAGNOSIS — I69351 Hemiplegia and hemiparesis following cerebral infarction affecting right dominant side: Secondary | ICD-10-CM

## 2021-02-18 NOTE — Therapy (Signed)
[facility] [address] [place], [place], [address] Phone: [phone]   Fax:  [phone]  Occupational Therapy Treatment  Patient Details  Name: Trevor Anderson MRN: [id number] Date of Birth: 05-07-1955 Referring Provider (OT): Noel Gerold, NP   Encounter Date: 02/18/2021   OT End of Session - 02/18/21 1307     Visit Number 10    Number of Visits 15    Date for OT Re-Evaluation 03/23/21    Authorization Type VA    Authorization Time Period 15 visits approved 01/12/21 - 04/27/21    Authorization - Visit Number 10    Authorization - Number of Visits 15    Progress Note Due on Visit 10    OT Start Time 1230    OT Stop Time 1315    OT Time Calculation (min) 45 min    Activity Tolerance Patient tolerated treatment well;Patient limited by pain    Behavior During Therapy [facility] for tasks assessed/performed             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   CONGESTIVE HEART FAILURE 11/12/2007   DIABETES MELLITUS, TYPE II 11/20/2008   HYPERLIPIDEMIA 11/12/2007   HYPERTENSION 11/12/2007   TACHYCARDIA 01/24/2010    Past Surgical History:  Procedure Laterality Date   BUBBLE STUDY  03/16/2020   Procedure: BUBBLE STUDY;  Surgeon: Jodelle Red, MD;  Location: [facility] ENDOSCOPY;  Service: Cardiovascular;;   LOOP RECORDER INSERTION N/A 03/23/2020   Procedure: LOOP RECORDER INSERTION;  Surgeon: Hillis Range, MD;  [facility]: [facility];  Service: Cardiovascular;  Laterality: N/A;   TEE WITHOUT CARDIOVERSION N/A 03/16/2020   Procedure: TRANSESOPHAGEAL ECHOCARDIOGRAM (TEE);  Surgeon: Jodelle Red, MD;  Location: [facility] ENDOSCOPY;  Service: Cardiovascular;  Laterality: N/A;   TONSILLECTOMY  1970    There were no vitals filed for this visit.   Subjective Assessment - 02/18/21 1236     Subjective  "my hips been hurting all week - now my backs hurting"    Patient is  accompanied by: Family member   Josue, son   Pertinent History HLD, HTN, DM, CHF    Limitations Fall Risk. Mod stand pivot transfer w RW    Patient Stated Goals to move it (RUE)    Currently in Pain? Yes    Pain Score 1     Pain Location Back    Pain Orientation Right    Pain Descriptors / Indicators Aching    Pain Type Acute pain;Chronic pain    Pain Onset Today    Pain Frequency Intermittent             Transfers: max A stand pivot w/c > edge of mat. Mod A sit to supine to side lying bed mobility.  Sidelying on R side for weigh bearing into R side - gentle rolling for increasing range of motion - pt tolerated well. Assessed and worked on [employer] for RUE at elbow and shoulder while in sidelying. Pt maintaining elbow flexion (90degrees) x 5 seconds and with min difficulty.   Manual Therapy PROM RUE shoulder and elbow, wrist and hand. Continues with significant wrist flexion and difficulty with achieving wrist extension to neutral.                           OT Short Term Goals - 02/18/21 1308       OT SHORT TERM GOAL #1   Title  Pt and caregivers will be independent with HEP    Time 4    Period Weeks    Status Achieved    Target Date 02/09/21      OT SHORT TERM GOAL #2   Title Pt will complete UB dressing with min A for pull over short sleeve shirt.    Baseline mod A    Time 4    Period Weeks    Status Achieved   per pt report - completing with min A for hemi arm.     OT SHORT TERM GOAL #3   Title Pt and caregiver will verbalize understanding of adapted strategies and/or equipment for completing toilet and tub transfers with mod A consistently.    Baseline currently mod/max and not getting to shower.    Time 4    Period Weeks    Status On-going      OT SHORT TERM GOAL #4   Title Pt will achieve 90* of PROM for RUE shoulder flexion to reduce risk of malpositioning and increased pain.    Time 4    Period Weeks    Status On-going      OT SHORT  TERM GOAL #5   Title Pt and caregivers will verbalize and demonstrate understanding of wear and care of any splints and/or orthoses PRN.    Time 4    Period Weeks    Status Achieved               OT Long Term Goals - 01/27/21 1848       OT LONG TERM GOAL #1   Title Pt and caregivers will be independent with and updated HEPs    Time 10    Period Weeks    Status New      OT LONG TERM GOAL #2   Title Pt will complete toilet and tub transfers consistently with min assistance with adapted strategies and equipment PRN    Baseline mod A for stand pivot    Time 10    Period Weeks    Status New      OT LONG TERM GOAL #3   Title Pt will complete bathing with min A    Baseline mod-max    Time 10    Period Weeks    Status New      OT LONG TERM GOAL #4   Title Pt will demonstrate composite flexion/extension of 15% or greater in RUE for preparing for active grasp/release and increasing functional use.    Baseline trace extension    Time 10    Period Weeks    Status New      OT LONG TERM GOAL #5   Title Pt will achieve -20 degrees of elbow extension in RUE of PROM or decreasing risk of skin breakdown    Baseline -40*    Time 10    Period Weeks    Status New                   Plan - 02/18/21 1457     Clinical Impression Statement Pt did well with rolling onto R side on mat and with RUE ROM today.    OT Occupational Profile and History Problem Focused Assessment - Including review of records relating to presenting problem    Occupational performance deficits (Please refer to evaluation for details): IADL's;ADL's;Leisure    Body Structure / Function / Physical Skills Tone;Strength;ADL;FMC;Mobility;GMC;ROM;IADL;UE functional use;Decreased knowledge of use of DME    Cognitive  Skills Problem Solve    Rehab Potential Good    Clinical Decision Making Limited treatment options, no task modification necessary    Comorbidities Affecting Occupational Performance: None     Modification or Assistance to Complete Evaluation  No modification of tasks or assist necessary to complete eval    OT Frequency 2x / week    OT Duration Other (comment)   15 visits over 10 weeks d/t any scheduling conflicts.   OT Treatment/Interventions Self-care/ADL training;Manual Therapy;Patient/family education;Electrical Stimulation;Neuromuscular education;Functional Mobility Training;Passive range of motion;Cognitive remediation/compensation;Therapeutic exercise;Moist Heat;DME and/or AE instruction;Therapeutic activities;Aquatic Therapy;Splinting    Plan Postural control - needs to work on forward weight shifts for transfers, need to orient to midline,  make any adjustments to splint PRN, supine manual therapy to RUE shoulder and get supine stretches (self PROM)    OT Home Exercise Plan self PROM    Consulted and Agree with Plan of Care Patient;Family member/caregiver    Family Member Consulted son, Simuel             Patient will benefit from skilled therapeutic intervention in order to improve the following deficits and impairments:   Body Structure / Function / Physical Skills: Tone, Strength, ADL, FMC, Mobility, GMC, ROM, IADL, UE functional use, Decreased knowledge of use of DME Cognitive Skills: Problem Solve     Visit Diagnosis: Muscle weakness (generalized)  Stiffness of right shoulder, not elsewhere classified  Hemiplegia and hemiparesis following cerebral infarction affecting right dominant side (HCC)  Other lack of coordination  Attention and concentration deficit  Stiffness of right elbow, not elsewhere classified  Unsteadiness on feet    Problem List Patient Active Problem List   Diagnosis Date Noted   Cerebrovascular accident (CVA) ([facility])    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   Hypertensive urgency 03/12/2020   Acute on chronic systolic CHF (congestive heart failure) ([facility]) 03/12/2020   AKI (acute kidney injury) ([facility]) 03/12/2020    Fall at home, initial encounter 03/12/2020   Right hip pain 03/12/2020   Hypokalemia 03/12/2020   Coronary artery disease involving native coronary artery of native heart without angina pectoris 09/07/2014   Renal insufficiency 12/02/2012   Preventative health care 11/13/2010   Secondary cardiomyopathy ([facility]) 01/24/2010   TACHYCARDIA 01/24/2010   Type 2 diabetes mellitus without complication, with long-term current use of insulin ([facility]) 11/20/2008   Mixed hyperlipidemia 11/12/2007   Essential hypertension 11/12/2007   CONGESTIVE HEART FAILURE 11/12/2007    Junious Dresser, OT/L 02/18/2021, 3:01 PM  [facility] [address] [place], [place], [address] Phone: [phone]   Fax:  [phone]  Name: KYRUS HYDE MRN: [id number] Date of Birth: 08-07-1955

## 2021-02-21 ENCOUNTER — Ambulatory Visit: Payer: No Typology Code available for payment source | Admitting: Occupational Therapy

## 2021-02-21 ENCOUNTER — Ambulatory Visit: Payer: No Typology Code available for payment source | Admitting: Physical Therapy

## 2021-02-21 NOTE — Therapy (Signed)
[facility] [address] [place], [place], [address] Phone: [phone]   Fax:  [phone]  Physical Therapy Treatment  Patient Details  Name: Trevor Anderson MRN: [id number] Date of Birth: [birth year] Referring Provider (PT): Deland Pretty   Encounter Date: 02/18/2021    02/18/21 1322  PT Visits / Re-Eval  Visit Number 17  Number of Visits 28  Date for PT Re-Evaluation 03/04/21  Authorization  Authorization Type VA auth# [id number] (15 visits); VA has approved additional 15 PT visits to 05/26/2021  Authorization - Visit Number 2  Authorization - Number of Visits 15  PT Time Calculation  PT Start Time 0998  PT Stop Time 1400  PT Time Calculation (min) 42 min  PT - End of Session  Equipment Utilized During Treatment Gait belt  Activity Tolerance Patient tolerated treatment well  Behavior During Therapy [facility] for tasks assessed/performed    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   CONGESTIVE HEART FAILURE 11/12/2007   DIABETES MELLITUS, TYPE II 11/20/2008   HYPERLIPIDEMIA 11/12/2007   HYPERTENSION 11/12/2007   TACHYCARDIA 01/24/2010    Past Surgical History:  Procedure Laterality Date   BUBBLE STUDY  03/16/2020   Procedure: BUBBLE STUDY;  Surgeon: Buford Dresser, MD;  Location: [facility];  Service: Cardiovascular;;   LOOP RECORDER INSERTION N/A 03/23/2020   Procedure: LOOP RECORDER INSERTION;  Surgeon: Thompson Grayer, MD;  Location: [facility];  Service: Cardiovascular;  Laterality: N/A;   TEE WITHOUT CARDIOVERSION N/A 03/16/2020   Procedure: TRANSESOPHAGEAL ECHOCARDIOGRAM (TEE);  Surgeon: Buford Dresser, MD;  Location: [facility] ENDOSCOPY;  Service: Cardiovascular;  Laterality: N/A;   TONSILLECTOMY  1970    There were no vitals filed for this visit.     02/18/21 1320  Symptoms/Limitations  Subjective No new complaints. No falls. Tring to stand  some at home for short periods of time for ADLs. Limited due to tone in right leg, continues to "draw up".  Limitations Standing  How long can you stand comfortably? 5-10 minutes  Patient Stated Goals Open/close hand; Get more R LE motion;  Pain Assessment  Currently in Pain? Yes  Pain Score 1  Pain Location Back  Pain Orientation Right  Pain Descriptors / Indicators Aching;Sore  Pain Type Acute pain;Chronic pain  Pain Onset In the past 7 days  Pain Frequency Intermittent  Aggravating Factors  " I don't know"  Pain Relieving Factors rest, relaxing     02/18/21 1322  Transfers  Transfers Sit to Stand;Stand to Sit  Sit to Stand 3: Mod assist;With upper extremity assist;From bed;From chair/3-in-1  Sit to Stand Details Tactile cues for weight shifting;Tactile cues for placement;Verbal cues for technique;Verbal cues for sequencing  Sit to Stand Details (indicate cue type and reason) cues/assist to scoot to edge of surface, for hand placement and weight shifting. occasional assist/cues for right foot placement  Stand to Sit 4: Min assist;3: Mod assist;With upper extremity assist;To bed;To chair/3-in-1  Stand to Sit Details (indicate cue type and reason) Tactile cues for weight shifting;Verbal cues for sequencing;Verbal cues for technique  Stand to Sit Details cues to reach back and use right UE for controlled descent.  Stand Pivot Transfers 3: Mod assist;Other (comment)  Stand Pivot Transfer Details (indicate cue type and reason) with RW/clinic AFO for transfer mat>wheelchair<>mat table with decreased assistance and cues needed as reps progressed. cues/facilitation needed for weight shifting and posture/technique.  Neuro Re-ed   Neuro Re-ed Details  for balance/muscle  re-ed: in standing at [facility]- working on tall posture in midline, lateral pelvic weight shifting with emphasis on keeping trunk/shoulders neutral.  Exercises  Exercises Other Exercises  Other Exercises  seated at edge of mat  table: right heel cord stretching for 30 sec's x 3 reps; LAQ's for 10 reps with assist for full range of motion with upright posture, then foot slides on floor with pillow case, assist for full range of motion for 10 reps.            PT Short Term Goals - 02/14/21 1806       PT SHORT TERM GOAL #1   Title Pt will perform HEP with caregiver assistance, for improved flexibility, strength, transfers.  TARGET 02/04/2021    Baseline reports completing 2-3x/week with caregiver assistance    Time 4    Period Weeks    Status Achieved      PT SHORT TERM GOAL #2   Title Pt will perform sit to stand transfer with mod assist.    Baseline currently max assist with sit<>stand; continue to require max A for transfer on 10/24    Time 4    Period Weeks    Status Not Met      PT SHORT TERM GOAL #3   Title Caregiver will verbalize improved stand pivot transfers from w/c<>mat/bed with mod assist (at least 50% of the time).    Baseline mod/max with sliding board, has just improved to mod assist in clinic 01/06/2021; unable to assess due to caregiver not present at session on 10/24, will address at next session    Time 4    Period Weeks    Status On-going               PT Long Term Goals - 01/12/21 9150       PT LONG TERM GOAL #1   Title Pt will be able to perform progression of/finalized HEP with caregiver assist,  TARGET 03/04/21    Baseline has initial HEP and will benefit from continued progression of HEP    Time 8    Period Weeks    Status Revised      PT LONG TERM GOAL #2   Title Pt will be able to perform all edge of bed scooting with min A for improved ease of transfers, decreased caregiver burden    Baseline 01/07/21: pt continues to need cues and up to min to mod  assist, improved from mod/max assist    Time 8    Period Weeks    Status Revised      PT LONG TERM GOAL #3   Title Caregiver will report standing at sink or RW at home, for at least 5 minutes, at least 5 days per  week, with min guard>supervision, for improved standing tolerance for ADL participation.    Baseline 01/07/21: min assist in PT session    Time 8    Period Weeks    Status Revised      PT LONG TERM GOAL #4   Title Pt will be able to perform wheelchair transfer with min A for improved independence, decreased caregiver burden    Baseline 01/07/21: mod assist of one person with RW for stand pivot transfers. improved from mod/max assist with slide board transfer    Time 8    Period Weeks    Status Revised      PT LONG TERM GOAL #5   Title Pt/family/caregiver will verbalize plans for continued community fitness  upon d/c from PT to maximize gains made in therapy.    Time 8    Period Weeks    Status New               02/18/21 1322  Plan  Clinical Impression Statement Today's skilled session continued to focus on stretching/strengthening, transfer training and pre-gait activities with no issues noted or reported in session. The pt is progressing and should benefit from continued PT to progress toward unmet goals.  Personal Factors and Comorbidities Age;Time since onset of injury/illness/exacerbation  Examination-Activity Limitations Locomotion Level;Transfers;Sit;Stand;Toileting;Dressing;Hygiene/Grooming;Self Feeding;Bed Mobility;Bend;Bathing  Examination-Participation Restrictions Community Activity;Shop;Meal Prep;Yard Work  Pt will benefit from skilled therapeutic intervention in order to improve on the following deficits Abnormal gait;Difficulty walking;Impaired tone;Decreased range of motion;Decreased coordination;Decreased endurance;Impaired UE functional use;Decreased activity tolerance;Pain;Decreased balance;Decreased mobility;Decreased strength;Impaired sensation;Postural dysfunction  Stability/Clinical Decision Making Evolving/Moderate complexity  Rehab Potential Fair  PT Frequency 2x / week  PT Duration 8 weeks (per recert after 5/40/98 visit)  PT Treatment/Interventions  ADLs/Self Care Home Management;Electrical Stimulation;DME Instruction;Gait training;Functional mobility training;Therapeutic activities;Therapeutic exercise;Balance training;Neuromuscular re-education;Manual techniques;Patient/family education;Orthotic Fit/Training;Wheelchair mobility training;Passive range of motion;Dry needling;Taping  PT Next Visit Plan Try standing in parallel bars or at sink to increase standing time and UE support for standing balance activities.  continue to work on stand pivot transfers with RW, standing balance with RW, pre gait activities. LE stretching to assist with tone/spasticity management (PT to request additional VA auth)  PT Home Exercise Plan Forward lean x20 with caregiver; standing 3x daily for at least 30 sec with caregiver; [place] 12/29/20  Consulted and Agree with Plan of Care Patient;Family member/caregiver  Family Member Consulted Son         Patient will benefit from skilled therapeutic intervention in order to improve the following deficits and impairments:  Abnormal gait, Difficulty walking, Impaired tone, Decreased range of motion, Decreased coordination, Decreased endurance, Impaired UE functional use, Decreased activity tolerance, Pain, Decreased balance, Decreased mobility, Decreased strength, Impaired sensation, Postural dysfunction  Visit Diagnosis: Muscle weakness (generalized)  Hemiplegia and hemiparesis following cerebral infarction affecting right dominant side (HCC)  Unsteadiness on feet  Abnormal posture     Problem List Patient Active Problem List   Diagnosis Date Noted   Cerebrovascular accident (CVA) ([place])    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   Hypertensive urgency 03/12/2020   Acute on chronic systolic CHF (congestive heart failure) ([place]) 03/12/2020   AKI (acute kidney injury) ([place]) 03/12/2020   Fall at home, initial encounter 03/12/2020   Right hip pain 03/12/2020   Hypokalemia 03/12/2020    Coronary artery disease involving native coronary artery of native heart without angina pectoris 09/07/2014   Renal insufficiency 12/02/2012   Preventative health care 11/13/2010   Secondary cardiomyopathy ([place]) 01/24/2010   TACHYCARDIA 01/24/2010   Type 2 diabetes mellitus without complication, with long-term current use of insulin ([place]) 11/20/2008   Mixed hyperlipidemia 11/12/2007   Essential hypertension 11/12/2007   CONGESTIVE HEART FAILURE 11/12/2007   Willow Ora, PTA, [facility] [address] [place], [place] [address] [phone] 02/21/21, 1:02 AM   Name: Trevor Anderson MRN: [id number] Date of Birth: 01/18/1956

## 2021-02-22 LAB — CUP PACEART REMOTE DEVICE CHECK
Date Time Interrogation Session: 20221031174429
Implantable Pulse Generator Implant Date: 20211130

## 2021-02-23 ENCOUNTER — Other Ambulatory Visit: Payer: Self-pay

## 2021-02-23 ENCOUNTER — Ambulatory Visit: Payer: No Typology Code available for payment source | Attending: Adult Health | Admitting: Occupational Therapy

## 2021-02-23 ENCOUNTER — Encounter: Payer: Self-pay | Admitting: Physical Therapy

## 2021-02-23 ENCOUNTER — Ambulatory Visit: Payer: No Typology Code available for payment source | Admitting: Physical Therapy

## 2021-02-23 DIAGNOSIS — M6281 Muscle weakness (generalized): Secondary | ICD-10-CM

## 2021-02-23 DIAGNOSIS — R293 Abnormal posture: Secondary | ICD-10-CM | POA: Insufficient documentation

## 2021-02-23 DIAGNOSIS — M25621 Stiffness of right elbow, not elsewhere classified: Secondary | ICD-10-CM | POA: Insufficient documentation

## 2021-02-23 DIAGNOSIS — R278 Other lack of coordination: Secondary | ICD-10-CM | POA: Diagnosis not present

## 2021-02-23 DIAGNOSIS — R41840 Attention and concentration deficit: Secondary | ICD-10-CM | POA: Diagnosis not present

## 2021-02-23 DIAGNOSIS — G463 Brain stem stroke syndrome: Secondary | ICD-10-CM | POA: Diagnosis present

## 2021-02-23 DIAGNOSIS — I69351 Hemiplegia and hemiparesis following cerebral infarction affecting right dominant side: Secondary | ICD-10-CM | POA: Insufficient documentation

## 2021-02-23 DIAGNOSIS — M25611 Stiffness of right shoulder, not elsewhere classified: Secondary | ICD-10-CM | POA: Diagnosis not present

## 2021-02-23 DIAGNOSIS — R2681 Unsteadiness on feet: Secondary | ICD-10-CM

## 2021-02-23 NOTE — Therapy (Signed)
[facility] [address] [place], [place], [address] Phone: [phone]   Fax:  [phone]  Occupational Therapy Treatment/Progress Note  Patient Details  Name: Trevor Anderson MRN: [id number] Date of Birth: May 21, 1955 Referring Provider (OT): Noel Gerold, NP   Encounter Date: 02/23/2021   OT End of Session - 02/23/21 1320     Visit Number 11    Number of Visits 15    Date for OT Re-Evaluation 03/23/21    Authorization Type VA    Authorization Time Period 15 visits approved 01/12/21 - 04/27/21    Authorization - Visit Number 11    Authorization - Number of Visits 15    Progress Note Due on Visit 10    OT Start Time 1317    OT Stop Time 1400    OT Time Calculation (min) 43 min    Activity Tolerance Patient tolerated treatment well;Patient limited by pain    Behavior During Therapy [facility] for tasks assessed/performed             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   CONGESTIVE HEART FAILURE 11/12/2007   DIABETES MELLITUS, TYPE II 11/20/2008   HYPERLIPIDEMIA 11/12/2007   HYPERTENSION 11/12/2007   TACHYCARDIA 01/24/2010    Past Surgical History:  Procedure Laterality Date   BUBBLE STUDY  03/16/2020   Procedure: BUBBLE STUDY;  Surgeon: Jodelle Red, MD;  Location: [facility] ENDOSCOPY;  Service: Cardiovascular;;   LOOP RECORDER INSERTION N/A 03/23/2020   Procedure: LOOP RECORDER INSERTION;  Surgeon: Hillis Range, MD;  Location: [facility] INVASIVE CV LAB;  Service: Cardiovascular;  Laterality: N/A;   TEE WITHOUT CARDIOVERSION N/A 03/16/2020   Procedure: TRANSESOPHAGEAL ECHOCARDIOGRAM (TEE);  Surgeon: Jodelle Red, MD;  Location: [facility] ENDOSCOPY;  Service: Cardiovascular;  Laterality: N/A;   TONSILLECTOMY  1970    There were no vitals filed for this visit.   Subjective Assessment - 02/23/21 1319     Subjective  "Halloween is over!"    Patient is accompanied by: Family  member   C[place], son   Pertinent History HLD, HTN, DM, CHF    Limitations Fall Risk. Mod stand pivot transfer w RW    Patient Stated Goals to move it (RUE)    Currently in Pain? Yes    Pain Score 1     Pain Location Arm   + hip   Pain Orientation Right    Pain Descriptors / Indicators Aching;Sore    Pain Type Chronic pain    Pain Onset Today    Pain Frequency Constant              Transfer stand pivot mod  A x 2 people to edge of mat with rolling walker.   RUE NMR with weight bearing, hemi glide and manual therapy with joint mobilizations at wrist and hand. Pt demonstrates trace activation for digit extension and flexion.  Forward Lean at edge of mat for reaching to [employer] with LUE for increased leaning forward to assist with independence with transfers and    Occupational Therapy Progress Note  Dates of Reporting Period: 01/12/21 to 02/23/21  Objective Reports of Subjective Statement: Pt has increased independence with bathing and UB dressing per report. Pt is making progress with RUE but would benefit from botox for spasticity. Pt benefitting from custom resting hand splint for RUE  Plan: Continue to work towards unmet goals and RUE NMR  Reason Skilled Services are Required: Skilled OT recommended to target  listed areas of deficit and increase independence with ADLs and IADLs and decrease caregiver burden.                      OT Short Term Goals - 02/18/21 1308       OT SHORT TERM GOAL #1   Title Pt and caregivers will be independent with HEP    Time 4    Period Weeks    Status Achieved    Target Date 02/09/21      OT SHORT TERM GOAL #2   Title Pt will complete UB dressing with min A for pull over short sleeve shirt.    Baseline mod A    Time 4    Period Weeks    Status Achieved   per pt report - completing with min A for hemi arm.     OT SHORT TERM GOAL #3   Title Pt and caregiver will verbalize understanding of adapted strategies  and/or equipment for completing toilet and tub transfers with mod A consistently.    Baseline currently mod/max and not getting to shower.    Time 4    Period Weeks    Status On-going      OT SHORT TERM GOAL #4   Title Pt will achieve 90* of PROM for RUE shoulder flexion to reduce risk of malpositioning and increased pain.    Time 4    Period Weeks    Status On-going      OT SHORT TERM GOAL #5   Title Pt and caregivers will verbalize and demonstrate understanding of wear and care of any splints and/or orthoses PRN.    Time 4    Period Weeks    Status Achieved               OT Long Term Goals - 01/27/21 1848       OT LONG TERM GOAL #1   Title Pt and caregivers will be independent with and updated HEPs    Time 10    Period Weeks    Status New      OT LONG TERM GOAL #2   Title Pt will complete toilet and tub transfers consistently with min assistance with adapted strategies and equipment PRN    Baseline mod A for stand pivot    Time 10    Period Weeks    Status New      OT LONG TERM GOAL #3   Title Pt will complete bathing with min A    Baseline mod-max    Time 10    Period Weeks    Status New      OT LONG TERM GOAL #4   Title Pt will demonstrate composite flexion/extension of 15% or greater in RUE for preparing for active grasp/release and increasing functional use.    Baseline trace extension    Time 10    Period Weeks    Status New      OT LONG TERM GOAL #5   Title Pt will achieve -20 degrees of elbow extension in RUE of PROM or decreasing risk of skin breakdown    Baseline -40*    Time 10    Period Weeks    Status New                   Plan - 02/23/21 1410     Clinical Impression Statement This note serves as progress note for period of 01/12/21 to 02/23/21.  Pt has increased independence with bathing and UB dressing per report. Pt is making progress with RUE but would benefit from botox for spasticity. Pt benefitting from custom resting hand  splint for RUE. Pt with good sitting balance and leaning forward today. Continue to progress towards goals. Skilled OT is recommended to continue to increase independence and decrease caregiver burden. Goals checked last session but PN was not sent off.    OT Occupational Profile and History Problem Focused Assessment - Including review of records relating to presenting problem    Occupational performance deficits (Please refer to evaluation for details): IADL's;ADL's;Leisure    Body Structure / Function / Physical Skills Tone;Strength;ADL;FMC;Mobility;GMC;ROM;IADL;UE functional use;Decreased knowledge of use of DME    Cognitive Skills Problem Solve    Rehab Potential Good    Clinical Decision Making Limited treatment options, no task modification necessary    Comorbidities Affecting Occupational Performance: None    Modification or Assistance to Complete Evaluation  No modification of tasks or assist necessary to complete eval    OT Frequency 2x / week    OT Duration Other (comment)   15 visits over 10 weeks d/t any scheduling conflicts.   OT Treatment/Interventions Self-care/ADL training;Manual Therapy;Patient/family education;Electrical Stimulation;Neuromuscular education;Functional Mobility Training;Passive range of motion;Cognitive remediation/compensation;Therapeutic exercise;Moist Heat;DME and/or AE instruction;Therapeutic activities;Aquatic Therapy;Splinting    Plan Postural control - needs to work on forward weight shifts for transfers, need to orient to midline,  make any adjustments to splint PRN, supine manual therapy to RUE shoulder and get supine stretches (self PROM)    OT Home Exercise Plan self PROM    Consulted and Agree with Plan of Care Patient;Family member/caregiver    Family Member Consulted son, Soua             Patient will benefit from skilled therapeutic intervention in order to improve the following deficits and impairments:   Body Structure / Function / Physical  Skills: Tone, Strength, ADL, FMC, Mobility, GMC, ROM, IADL, UE functional use, Decreased knowledge of use of DME Cognitive Skills: Problem Solve     Visit Diagnosis: Stiffness of right elbow, not elsewhere classified  Muscle weakness (generalized)  Hemiplegia and hemiparesis following cerebral infarction affecting right dominant side (HCC)  Unsteadiness on feet  Stiffness of right shoulder, not elsewhere classified  Other lack of coordination  Attention and concentration deficit    Problem List Patient Active Problem List   Diagnosis Date Noted   Cerebrovascular accident (CVA) (HCC)    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   Hypertensive urgency 03/12/2020   Acute on chronic systolic CHF (congestive heart failure) (HCC) 03/12/2020   AKI (acute kidney injury) (HCC) 03/12/2020   Fall at home, initial encounter 03/12/2020   Right hip pain 03/12/2020   Hypokalemia 03/12/2020   Coronary artery disease involving native coronary artery of native heart without angina pectoris 09/07/2014   Renal insufficiency 12/02/2012   Preventative health care 11/13/2010   Secondary cardiomyopathy (HCC) 01/24/2010   TACHYCARDIA 01/24/2010   Type 2 diabetes mellitus without complication, with long-term current use of insulin (HCC) 11/20/2008   Mixed hyperlipidemia 11/12/2007   Essential hypertension 11/12/2007   CONGESTIVE HEART FAILURE 11/12/2007    Junious Dresser, OT/L 02/23/2021, 2:45 PM  [facility] [address] [place], [place], [address] Phone: [phone]   Fax:  [phone]  Name: Trevor Anderson MRN: [id number] Date of Birth: [birth year]

## 2021-02-24 NOTE — Therapy (Signed)
[facility] [address] [place], [place], [address] Phone: [phone]   Fax:  [phone]  Physical Therapy Treatment  Patient Details  Name: Trevor Anderson MRN: [id number] Date of Birth: [birth year] Referring Provider (PT): Deland Pretty   Encounter Date: 02/23/2021   PT End of Session - 02/23/21 1401     Visit Number 18    Number of Visits 28    Date for PT Re-Evaluation 03/04/21    Authorization Type VA auth# [id number] (15 visits); VA has approved additional 15 PT visits to 05/26/2021    Authorization - Visit Number 3    Authorization - Number of Visits 15    PT Start Time 1401    PT Stop Time 9379    PT Time Calculation (min) 42 min    Equipment Utilized During Treatment Gait belt    Activity Tolerance Patient tolerated treatment well    Behavior During Therapy [facility] for tasks assessed/performed             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   CONGESTIVE HEART FAILURE 11/12/2007   DIABETES MELLITUS, TYPE II 11/20/2008   HYPERLIPIDEMIA 11/12/2007   HYPERTENSION 11/12/2007   TACHYCARDIA 01/24/2010    Past Surgical History:  Procedure Laterality Date   BUBBLE STUDY  03/16/2020   Procedure: BUBBLE STUDY;  Surgeon: Buford Dresser, MD;  Location: [facility];  Service: Cardiovascular;;   LOOP RECORDER INSERTION N/A 03/23/2020   Procedure: LOOP RECORDER INSERTION;  Surgeon: Thompson Grayer, MD;  Location: [facility];  Service: Cardiovascular;  Laterality: N/A;   TEE WITHOUT CARDIOVERSION N/A 03/16/2020   Procedure: TRANSESOPHAGEAL ECHOCARDIOGRAM (TEE);  Surgeon: Buford Dresser, MD;  Location: [facility] ENDOSCOPY;  Service: Cardiovascular;  Laterality: N/A;   TONSILLECTOMY  1970    There were no vitals filed for this visit.   Subjective Assessment - 02/23/21 1401     Subjective No new complaints. No falls or pain to report (did have right UE pain,  working with OT resolved it).    Limitations Standing    How long can you stand comfortably? 5-10 minutes    Patient Stated Goals Open/close hand; Get more R LE motion;    Currently in Pain? No/denies    Pain Score 0-No pain                   [facility] Adult PT Treatment/Exercise - 02/23/21 1403       Transfers   Transfers Sit to Stand;Stand to Sit;Stand Pivot Transfers    Sit to Stand 3: Mod assist;4: Min assist;From elevated surface;With upper extremity assist;From bed;From chair/3-in-1    Sit to Stand Details Tactile cues for weight shifting;Tactile cues for placement;Verbal cues for technique;Verbal cues for sequencing    Sit to Stand Details (indicate cue type and reason) cues to scoot to edge of surface and rocking used to gain momentum.Marland Kitchen PTA using foot to keep pt's right foot in place.    Stand to Sit 4: Min assist;3: Mod assist;With upper extremity assist;To bed;To chair/3-in-1    Stand to Sit Details (indicate cue type and reason) Tactile cues for weight shifting;Verbal cues for sequencing;Verbal cues for technique    Stand to Sit Details cues to reach back with left UE to assist with controlled descent with faciliation for hip flexion.      Ambulation/Gait   Ambulation/Gait Yes    Ambulation/Gait Assistance 4: Min assist;3: Mod assist   of 2  persons with wheelchair follow   Ambulation/Gait Assistance Details use of Bluerocker AFO with gait. Assist needed for weight shifting, right LE advancement, posture and balance. Pt able to self advance walker.    Ambulation Distance (Feet) --   ~8-10 feet x 4 reps   Assistive device Rolling walker;Other (Comment)   right Blue Rocker AFO   Gait Pattern Step-to pattern;Decreased stride length;Decreased step length - right;Decreased step length - left;Decreased stance time - right;Decreased hip/knee flexion - right;Decreased dorsiflexion - right;Decreased weight shift to right;Right flexed knee in stance;Decreased trunk rotation;Narrow base  of support;Poor foot clearance - right    Ambulation Surface Level;Indoor      Neuro Re-ed    Neuro Re-ed Details  for balance/muscle re-ed: standing at RW working on lateral weight shifting, the forward/backard stepping with right knee blocked in stance phase. then assist needed for  right foot stepping forward>backward. min guard to min assit for standing balance.      Exercises   Exercises Other Exercises    Other Exercises  seated at edge of mat table: right heel cord stretching for 30 sec's x 3 reps; LAQ's for 10 reps with assist for full range of motion with upright posture, then foot slides on floor with pillow case, assist for full range of motion for 10 reps.                  PT Short Term Goals - 02/14/21 1806       PT SHORT TERM GOAL #1   Title Pt will perform HEP with caregiver assistance, for improved flexibility, strength, transfers.  TARGET 02/04/2021    Baseline reports completing 2-3x/week with caregiver assistance    Time 4    Period Weeks    Status Achieved      PT SHORT TERM GOAL #2   Title Pt will perform sit to stand transfer with mod assist.    Baseline currently max assist with sit<>stand; continue to require max A for transfer on 10/24    Time 4    Period Weeks    Status Not Met      PT SHORT TERM GOAL #3   Title Caregiver will verbalize improved stand pivot transfers from w/c<>mat/bed with mod assist (at least 50% of the time).    Baseline mod/max with sliding board, has just improved to mod assist in clinic 01/06/2021; unable to assess due to caregiver not present at session on 10/24, will address at next session    Time 4    Period Weeks    Status On-going               PT Long Term Goals - 01/12/21 2023       PT LONG TERM GOAL #1   Title Pt will be able to perform progression of/finalized HEP with caregiver assist,  TARGET 03/04/21    Baseline has initial HEP and will benefit from continued progression of HEP    Time 8    Period  Weeks    Status Revised      PT LONG TERM GOAL #2   Title Pt will be able to perform all edge of bed scooting with min A for improved ease of transfers, decreased caregiver burden    Baseline 01/07/21: pt continues to need cues and up to min to mod  assist, improved from mod/max assist    Time 8    Period Weeks    Status Revised      PT  LONG TERM GOAL #3   Title Caregiver will report standing at sink or RW at home, for at least 5 minutes, at least 5 days per week, with min guard>supervision, for improved standing tolerance for ADL participation.    Baseline 01/07/21: min assist in PT session    Time 8    Period Weeks    Status Revised      PT LONG TERM GOAL #4   Title Pt will be able to perform wheelchair transfer with min A for improved independence, decreased caregiver burden    Baseline 01/07/21: mod assist of one person with RW for stand pivot transfers. improved from mod/max assist with slide board transfer    Time 8    Period Weeks    Status Revised      PT LONG TERM GOAL #5   Title Pt/family/caregiver will verbalize plans for continued community fitness upon d/c from PT to maximize gains made in therapy.    Time 8    Period Weeks    Status New                   Plan - 02/23/21 1402     Clinical Impression Statement Today's skilled session continued to focus on right LE strengthening, transfers, standing blance and initiated gait training. The pt is making steady progress toward goals and should benefit from continued PT to progress toward unmet goals.    Personal Factors and Comorbidities Age;Time since onset of injury/illness/exacerbation    Examination-Activity Limitations Locomotion Level;Transfers;Sit;Stand;Toileting;Dressing;Hygiene/Grooming;Self Feeding;Bed Mobility;Bend;Bathing    Examination-Participation Restrictions Community Activity;Shop;Meal Prep;Yard Work    Stability/Clinical Decision Making Evolving/Moderate complexity    Rehab Potential Fair    PT  Frequency 2x / week    PT Duration 8 weeks   per recert after 07/25/45 visit   PT Treatment/Interventions ADLs/Self Care Home Management;Electrical Stimulation;DME Instruction;Gait training;Functional mobility training;Therapeutic activities;Therapeutic exercise;Balance training;Neuromuscular re-education;Manual techniques;Patient/family education;Orthotic Fit/Training;Wheelchair mobility training;Passive range of motion;Dry needling;Taping    PT Next Visit Plan Try standing in parallel bars or at sink to increase standing time and UE support for standing balance activities.  continue to work on stand pivot transfers with RW, standing balance with RW, pre gait activities. LE stretching to assist with tone/spasticity management priort to any standing or transfers. continue to work on gait with 2 person assit/Bluerocker AFO.   PT to request additional VA auth   PT Home Exercise Plan Forward lean x20 with caregiver; standing 3x daily for at least 30 sec with caregiver; [place] 12/29/20    Consulted and Agree with Plan of Care Patient;Family member/caregiver    Family Member Consulted Son             Patient will benefit from skilled therapeutic intervention in order to improve the following deficits and impairments:  Abnormal gait, Difficulty walking, Impaired tone, Decreased range of motion, Decreased coordination, Decreased endurance, Impaired UE functional use, Decreased activity tolerance, Pain, Decreased balance, Decreased mobility, Decreased strength, Impaired sensation, Postural dysfunction  Visit Diagnosis: Unsteadiness on feet  Hemiplegia and hemiparesis following cerebral infarction affecting right dominant side (HCC)  Muscle weakness (generalized)     Problem List Patient Active Problem List   Diagnosis Date Noted   Cerebrovascular accident (CVA) ([place])    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   Hypertensive urgency 03/12/2020   Acute on chronic systolic CHF  (congestive heart failure) ([place]) 03/12/2020   AKI (acute kidney injury) ([place]) 03/12/2020   Fall at home, initial encounter 03/12/2020  Right hip pain 03/12/2020   Hypokalemia 03/12/2020   Coronary artery disease involving native coronary artery of native heart without angina pectoris 09/07/2014   Renal insufficiency 12/02/2012   Preventative health care 11/13/2010   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

## 2021-02-28 ENCOUNTER — Ambulatory Visit (INDEPENDENT_AMBULATORY_CARE_PROVIDER_SITE_OTHER): Payer: No Typology Code available for payment source

## 2021-02-28 DIAGNOSIS — I639 Cerebral infarction, unspecified: Secondary | ICD-10-CM

## 2021-03-01 ENCOUNTER — Ambulatory Visit: Payer: No Typology Code available for payment source | Admitting: Physical Therapy

## 2021-03-01 ENCOUNTER — Ambulatory Visit: Payer: No Typology Code available for payment source | Admitting: Occupational Therapy

## 2021-03-01 ENCOUNTER — Encounter: Payer: Self-pay | Admitting: Occupational Therapy

## 2021-03-01 ENCOUNTER — Encounter: Payer: Self-pay | Admitting: Physical Therapy

## 2021-03-01 ENCOUNTER — Other Ambulatory Visit: Payer: Self-pay

## 2021-03-01 DIAGNOSIS — R41840 Attention and concentration deficit: Secondary | ICD-10-CM

## 2021-03-01 DIAGNOSIS — R2681 Unsteadiness on feet: Secondary | ICD-10-CM

## 2021-03-01 DIAGNOSIS — M6281 Muscle weakness (generalized): Secondary | ICD-10-CM

## 2021-03-01 DIAGNOSIS — I69351 Hemiplegia and hemiparesis following cerebral infarction affecting right dominant side: Secondary | ICD-10-CM

## 2021-03-01 DIAGNOSIS — M25611 Stiffness of right shoulder, not elsewhere classified: Secondary | ICD-10-CM

## 2021-03-01 DIAGNOSIS — G463 Brain stem stroke syndrome: Secondary | ICD-10-CM | POA: Diagnosis not present

## 2021-03-01 DIAGNOSIS — M25621 Stiffness of right elbow, not elsewhere classified: Secondary | ICD-10-CM

## 2021-03-01 DIAGNOSIS — R278 Other lack of coordination: Secondary | ICD-10-CM

## 2021-03-01 NOTE — Therapy (Signed)
[facility] [address] [place], [place], [address] Phone: [phone]   Fax:  [phone]  Occupational Therapy Treatment  Patient Details  Name: Trevor Anderson MRN: [id number] Date of Birth: August 13, 1955 Referring Provider (OT): Noel Gerold, NP   Encounter Date: 03/01/2021   OT End of Session - 03/01/21 1320     Visit Number 12    Number of Visits 15    Date for OT Re-Evaluation 03/23/21    Authorization Type VA    Authorization Time Period 15 visits approved 01/12/21 - 04/27/21    Authorization - Visit Number 12    Authorization - Number of Visits 15    Progress Note Due on Visit 10    OT Start Time 1318    OT Stop Time 1356    OT Time Calculation (min) 38 min    Activity Tolerance Patient tolerated treatment well;Patient limited by pain    Behavior During Therapy [facility] for tasks assessed/performed             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   CONGESTIVE HEART FAILURE 11/12/2007   DIABETES MELLITUS, TYPE II 11/20/2008   HYPERLIPIDEMIA 11/12/2007   HYPERTENSION 11/12/2007   TACHYCARDIA 01/24/2010    Past Surgical History:  Procedure Laterality Date   BUBBLE STUDY  03/16/2020   Procedure: BUBBLE STUDY;  Surgeon: Jodelle Red, MD;  Location: [facility] ENDOSCOPY;  Service: Cardiovascular;;   LOOP RECORDER INSERTION N/A 03/23/2020   Procedure: LOOP RECORDER INSERTION;  Surgeon: Hillis Range, MD;  Location: [facility];  Service: Cardiovascular;  Laterality: N/A;   TEE WITHOUT CARDIOVERSION N/A 03/16/2020   Procedure: TRANSESOPHAGEAL ECHOCARDIOGRAM (TEE);  Surgeon: Jodelle Red, MD;  Location: [facility] ENDOSCOPY;  Service: Cardiovascular;  Laterality: N/A;   TONSILLECTOMY  1970    There were no vitals filed for this visit.   Subjective Assessment - 03/01/21 1318     Subjective  "I though you weren't here"    Patient is accompanied by: --    caregiver   Pertinent History HLD, HTN, DM, CHF    Limitations Fall Risk. Max/Total stand pivot transfer w RW. Loop Recorder (no ESTIM)    Patient Stated Goals to move it (RUE)    Currently in Pain? Yes    Pain Score 1     Pain Location Back    Pain Orientation Mid    Pain Descriptors / Indicators Aching;Sore    Pain Type Acute pain    Pain Onset Today    Pain Frequency Occasional    Aggravating Factors  sitting up on edge of mat              Transfers Edge of mat > BSC > Tilt in Space w/c with max - total assistance with stand pivot transfer each time. Pt demonstrating increased fatigue and SOB today.   Estim  pt with loop recorder placed and advised against Estim use d/t contraindication.  RUE  Manual Therapy and support for self PROM and AAROM for RUE. PROM provided at RUE shoulder, wrist and hand. Pt demonstrated trace activation for thumb and digit flexion and extension today.   Positioning lowered arm trough for beter positioning of RUE in tilt in space chair today.                         OT Short Term Goals - 02/18/21 1308  OT SHORT TERM GOAL #1   Title Pt and caregivers will be independent with HEP    Time 4    Period Weeks    Status Achieved    Target Date 02/09/21      OT SHORT TERM GOAL #2   Title Pt will complete UB dressing with min A for pull over short sleeve shirt.    Baseline mod A    Time 4    Period Weeks    Status Achieved   per pt report - completing with min A for hemi arm.     OT SHORT TERM GOAL #3   Title Pt and caregiver will verbalize understanding of adapted strategies and/or equipment for completing toilet and tub transfers with mod A consistently.    Baseline currently mod/max and not getting to shower.    Time 4    Period Weeks    Status On-going      OT SHORT TERM GOAL #4   Title Pt will achieve 90* of PROM for RUE shoulder flexion to reduce risk of malpositioning and increased pain.    Time 4    Period  Weeks    Status On-going      OT SHORT TERM GOAL #5   Title Pt and caregivers will verbalize and demonstrate understanding of wear and care of any splints and/or orthoses PRN.    Time 4    Period Weeks    Status Achieved               OT Long Term Goals - 01/27/21 1848       OT LONG TERM GOAL #1   Title Pt and caregivers will be independent with and updated HEPs    Time 10    Period Weeks    Status New      OT LONG TERM GOAL #2   Title Pt will complete toilet and tub transfers consistently with min assistance with adapted strategies and equipment PRN    Baseline mod A for stand pivot    Time 10    Period Weeks    Status New      OT LONG TERM GOAL #3   Title Pt will complete bathing with min A    Baseline mod-max    Time 10    Period Weeks    Status New      OT LONG TERM GOAL #4   Title Pt will demonstrate composite flexion/extension of 15% or greater in RUE for preparing for active grasp/release and increasing functional use.    Baseline trace extension    Time 10    Period Weeks    Status New      OT LONG TERM GOAL #5   Title Pt will achieve -20 degrees of elbow extension in RUE of PROM or decreasing risk of skin breakdown    Baseline -40*    Time 10    Period Weeks    Status New                   Plan - 03/01/21 1421     Clinical Impression Statement Pt with increased fatigue today but consistently transferring at max A level.    OT Occupational Profile and History Problem Focused Assessment - Including review of records relating to presenting problem    Occupational performance deficits (Please refer to evaluation for details): IADL's;ADL's;Leisure    Body Structure / Function / Physical Skills Tone;Strength;ADL;FMC;Mobility;GMC;ROM;IADL;UE functional use;Decreased knowledge of use of  DME    Cognitive Skills Problem Solve    Rehab Potential Good    Clinical Decision Making Limited treatment options, no task modification necessary     Comorbidities Affecting Occupational Performance: None    Modification or Assistance to Complete Evaluation  No modification of tasks or assist necessary to complete eval    OT Frequency 2x / week    OT Duration Other (comment)   15 visits over 10 weeks d/t any scheduling conflicts.   OT Treatment/Interventions Self-care/ADL training;Manual Therapy;Patient/family education;Electrical Stimulation;Neuromuscular education;Functional Mobility Training;Passive range of motion;Cognitive remediation/compensation;Therapeutic exercise;Moist Heat;DME and/or AE instruction;Therapeutic activities;Aquatic Therapy;Splinting    Plan Postural control - needs to work on forward weight shifts for transfers, need to orient to midline,  make any adjustments to splint PRN, supine manual therapy to RUE shoulder and get supine stretches (self PROM)    OT Home Exercise Plan self PROM    Consulted and Agree with Plan of Care Patient;Family member/caregiver    Family Member Consulted son, Revis             Patient will benefit from skilled therapeutic intervention in order to improve the following deficits and impairments:   Body Structure / Function / Physical Skills: Tone, Strength, ADL, FMC, Mobility, GMC, ROM, IADL, UE functional use, Decreased knowledge of use of DME Cognitive Skills: Problem Solve     Visit Diagnosis: Unsteadiness on feet  Hemiplegia and hemiparesis following cerebral infarction affecting right dominant side (HCC)  Muscle weakness (generalized)  Stiffness of right elbow, not elsewhere classified  Stiffness of right shoulder, not elsewhere classified  Other lack of coordination  Attention and concentration deficit    Problem List Patient Active Problem List   Diagnosis Date Noted   Cerebrovascular accident (CVA) (HCC)    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   Hypertensive urgency 03/12/2020   Acute on chronic systolic CHF (congestive heart failure) (HCC)  03/12/2020   AKI (acute kidney injury) (HCC) 03/12/2020   Fall at home, initial encounter 03/12/2020   Right hip pain 03/12/2020   Hypokalemia 03/12/2020   Coronary artery disease involving native coronary artery of native heart without angina pectoris 09/07/2014   Renal insufficiency 12/02/2012   Preventative health care 11/13/2010   Secondary cardiomyopathy (HCC) 01/24/2010   TACHYCARDIA 01/24/2010   Type 2 diabetes mellitus without complication, with long-term current use of insulin (HCC) 11/20/2008   Mixed hyperlipidemia 11/12/2007   Essential hypertension 11/12/2007   CONGESTIVE HEART FAILURE 11/12/2007    Junious Dresser, OT/L 03/01/2021, 2:25 PM  [facility] [address] [place], [place], [address] Phone: [phone]   Fax:  [phone]  Name: Trevor Anderson MRN: [id number] Date of Birth: Sep 19, 1955

## 2021-03-01 NOTE — Therapy (Addendum)
[facility] [address] [place], [place], [address] Phone: [phone]   Fax:  [phone]  Physical Therapy Treatment  Patient Details  Name: Trevor Anderson MRN: [id number] Date of Birth: 01-07-1956 Referring Provider (PT): Noel Gerold   Encounter Date: 03/01/2021   PT End of Session - 03/01/21 1235     Visit Number 19    Number of Visits 28    Date for PT Re-Evaluation 03/04/21    Authorization Type VA auth# [id number] (15 visits); VA has approved additional 15 PT visits to 05/26/2021    Authorization - Visit Number 4    Authorization - Number of Visits 15    PT Start Time 1233    PT Stop Time 1313    PT Time Calculation (min) 40 min    Equipment Utilized During Treatment Gait belt    Activity Tolerance Patient tolerated treatment well;Patient limited by fatigue    Behavior During Therapy [facility] for tasks assessed/performed             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   CONGESTIVE HEART FAILURE 11/12/2007   DIABETES MELLITUS, TYPE II 11/20/2008   HYPERLIPIDEMIA 11/12/2007   HYPERTENSION 11/12/2007   TACHYCARDIA 01/24/2010    Past Surgical History:  Procedure Laterality Date   BUBBLE STUDY  03/16/2020   Procedure: BUBBLE STUDY;  Surgeon: Jodelle Red, MD;  Location: [facility] ENDOSCOPY;  Service: Cardiovascular;;   LOOP RECORDER INSERTION N/A 03/23/2020   Procedure: LOOP RECORDER INSERTION;  Surgeon: Hillis Range, MD;  [facility] INVASIVE CV LAB;  Service: Cardiovascular;  Laterality: N/A;   TEE WITHOUT CARDIOVERSION N/A 03/16/2020   Procedure: TRANSESOPHAGEAL ECHOCARDIOGRAM (TEE);  Surgeon: Jodelle Red, MD;  Location: [facility] ENDOSCOPY;  Service: Cardiovascular;  Laterality: N/A;   TONSILLECTOMY  1970    There were no vitals filed for this visit.   Subjective Assessment - 03/01/21 1236     Subjective Nothing new. Is not doing any standing at  home.    Limitations Standing    How long can you stand comfortably? 5-10 minutes    Patient Stated Goals Open/close hand; Get more R LE motion;    Currently in Pain? No/denies                                03/01/21 0001  Transfers  Transfers Sit to Stand;Stand to Sit;Stand Pivot Transfers  Sit to Stand 3: Mod assist;2: Max assist  Sit to Stand Details Tactile cues for weight shifting;Tactile cues for placement;Verbal cues for technique;Verbal cues for sequencing  Sit to Stand Details (indicate cue type and reason) Cues to scoot towards edge of mat. Needing min/mod assist for pt to scoot towards edge in his w/c. Therapist using foot to help keep RLE in proper place and placing RUE on RW prior to standing. Cues for incr forward weight shift/momentum to stand. Initially in standing therapist needing to reposition RLE due to it drawing up into flexion and then reposition RUE on RW and PT tech helping to steady patient.  Stand to Sit 3: Mod assist;2: Max assist  Stand to Sit Details (indicate cue type and reason) Tactile cues for weight shifting;Verbal cues for sequencing;Verbal cues for technique  Stand to Sit Details Cues to reach back with LUE to control descent down to mat table. Therapist needing to help tip RW as pt's R hand stays on handle  Stand Pivot Transfers 2: Max assist  Stand Pivot Transfer Details (indicate cue type and reason) Attempted transfer x3 reps from pt's w/c with PT tech present and PT placing her foot to prevent pt's RLE from moving, once in standing pt's RLE draws up into flexion with no weight bearing on the ground, and RUE comes off of RW (despite therapist stretching it prior), and pt having incr forward flexed posture. In standing therapist attempting to reposition RLE, but pt sits down with uncontrolled descent back into manual w/c. After 3rd attempt, pt's caregiver demonstrated how they perform transfers at home - stand pivot without any weight  bearing through RLE.  Comments Put on bluerocker AFO before standing activities/transfers  Neuro Re-ed   Neuro Re-ed Details  Standing at [place] with R knee blocked during stance: x5 reps (performed each time during a stand for 10 reps total) - stepping LLE forwards and back to midline. Pt fatigues easily with this. x5 reps weight shifting through hips with verbal cues for technique/weight shift. Once in standing, therapist needs to assist pt's RLE in proper position (RLE with incr spasticity and more adducted)  Exercises  Exercises Other Exercises  Other Exercises  Prior to attempting transfers to mat table - R heel cord/hamstring stretching 3 x 30 seconds. Therapist helping stretch out pt's R hand/fingers before holding onto RW. Once on mat table before standing: x10 reps heel slides with RLE on pillow case with assist needed throughout, 2 x 10 reps LAQs with therapist assist needed throughout full ROM. Incr spasticity noted today.            PT Short Term Goals - 02/14/21 1806       PT SHORT TERM GOAL #1   Title Pt will perform HEP with caregiver assistance, for improved flexibility, strength, transfers.  TARGET 02/04/2021    Baseline reports completing 2-3x/week with caregiver assistance    Time 4    Period Weeks    Status Achieved      PT SHORT TERM GOAL #2   Title Pt will perform sit to stand transfer with mod assist.    Baseline currently max assist with sit<>stand; continue to require max A for transfer on 10/24    Time 4    Period Weeks    Status Not Met      PT SHORT TERM GOAL #3   Title Caregiver will verbalize improved stand pivot transfers from w/c<>mat/bed with mod assist (at least 50% of the time).    Baseline mod/max with sliding board, has just improved to mod assist in clinic 01/06/2021; unable to assess due to caregiver not present at session on 10/24, will address at next session    Time 4    Period Weeks    Status On-going               PT Long Term  Goals - 01/12/21 9794       PT LONG TERM GOAL #1   Title Pt will be able to perform progression of/finalized HEP with caregiver assist,  TARGET 03/04/21    Baseline has initial HEP and will benefit from continued progression of HEP    Time 8    Period Weeks    Status Revised      PT LONG TERM GOAL #2   Title Pt will be able to perform all edge of bed scooting with min A for improved ease of transfers, decreased caregiver burden    Baseline 01/07/21: pt continues to  need cues and up to min to mod  assist, improved from mod/max assist    Time 8    Period Weeks    Status Revised      PT LONG TERM GOAL #3   Title Caregiver will report standing at sink or RW at home, for at least 5 minutes, at least 5 days per week, with min guard>supervision, for improved standing tolerance for ADL participation.    Baseline 01/07/21: min assist in PT session    Time 8    Period Weeks    Status Revised      PT LONG TERM GOAL #4   Title Pt will be able to perform wheelchair transfer with min A for improved independence, decreased caregiver burden    Baseline 01/07/21: mod assist of one person with RW for stand pivot transfers. improved from mod/max assist with slide board transfer    Time 8    Period Weeks    Status Revised      PT LONG TERM GOAL #5   Title Pt/family/caregiver will verbalize plans for continued community fitness upon d/c from PT to maximize gains made in therapy.    Time 8    Period Weeks    Status New               03/02/21 1441  Plan  Clinical Impression Statement Pt with increased tone/spasticity in RUE/RLE today. Attempted 3 times to perform stand pivot transfers from pt's w/c to mat table with RW, needed max A, but ultimately unable to complete transfer as pt with incr spasticity into RLE flexion once in standing and RUE coming off RW. Once on mat table after caregiver demonstrating how they do transfers at home, performed standing with RW from an elevated mat table with  asisst of PT tech and working on weight shifting towards RLE. Pt initially needing max A to stand, but with incr reps pt able to perform with mod A. Will continue to progress towards LTGs.  Personal Factors and Comorbidities Age;Time since onset of injury/illness/exacerbation  Examination-Activity Limitations Locomotion Level;Transfers;Sit;Stand;Toileting;Dressing;Hygiene/Grooming;Self Feeding;Bed Mobility;Bend;Bathing  Examination-Participation Restrictions Community Activity;Shop;Meal Prep;Yard Work  Pt will benefit from skilled therapeutic intervention in order to improve on the following deficits Abnormal gait;Difficulty walking;Impaired tone;Decreased range of motion;Decreased coordination;Decreased endurance;Impaired UE functional use;Decreased activity tolerance;Pain;Decreased balance;Decreased mobility;Decreased strength;Impaired sensation;Postural dysfunction  Stability/Clinical Decision Making Evolving/Moderate complexity  Rehab Potential Fair  PT Frequency 2x / week  PT Duration 8 weeks (per recert after 10/13/61 visit)  PT Treatment/Interventions ADLs/Self Care Home Management;Electrical Stimulation;DME Instruction;Gait training;Functional mobility training;Therapeutic activities;Therapeutic exercise;Balance training;Neuromuscular re-education;Manual techniques;Patient/family education;Orthotic Fit/Training;Wheelchair mobility training;Passive range of motion;Dry needling;Taping  PT Next Visit Plan LTGs are already due and will need to do his recert. Try standing in parallel bars or at sink to increase standing time and UE support for standing balance activities.  continue to work on stand pivot transfers with RW, standing balance with RW, pre gait activities. LE stretching to assist with tone/spasticity management priort to any standing or transfers. continue to work on gait with 2 person assit/Bluerocker AFO. would sliding board be good to work on for transfers at home? (PT to request  additional VA auth)  PT Home Exercise Plan Forward lean x20 with caregiver; standing 3x daily for at least 30 sec with caregiver; [place] 12/29/20  Consulted and Agree with Plan of Care Patient;Family member/caregiver  Family Member Consulted Son         Patient will benefit from skilled therapeutic intervention in order  to improve the following deficits and impairments:     Visit Diagnosis: Unsteadiness on feet  Muscle weakness (generalized)  Hemiplegia and hemiparesis following cerebral infarction affecting right dominant side [facility])     Problem List Patient Active Problem List   Diagnosis Date Noted   Cerebrovascular accident (CVA) ([place])    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   Hypertensive urgency 03/12/2020   Acute on chronic systolic CHF (congestive heart failure) ([place]) 03/12/2020   AKI (acute kidney injury) ([place]) 03/12/2020   Fall at home, initial encounter 03/12/2020   Right hip pain 03/12/2020   Hypokalemia 03/12/2020   Coronary artery disease involving native coronary artery of native heart without angina pectoris 09/07/2014   Renal insufficiency 12/02/2012   Preventative health care 11/13/2010   Secondary cardiomyopathy ([place]) 01/24/2010   TACHYCARDIA 01/24/2010   Type 2 diabetes mellitus without complication, with long-term current use of insulin ([place]) 11/20/2008   Mixed hyperlipidemia 11/12/2007   Essential hypertension 11/12/2007   CONGESTIVE HEART FAILURE 11/12/2007    Arliss Journey, PT, DPT  03/01/2021, 2:43 PM  [facility] [address] [place], [place], [address] Phone: [phone]   Fax:  [phone]  Name: Trevor Anderson MRN: [id number] Date of Birth: 12-21-1955

## 2021-03-02 ENCOUNTER — Encounter: Payer: No Typology Code available for payment source | Admitting: Occupational Therapy

## 2021-03-04 ENCOUNTER — Ambulatory Visit: Payer: No Typology Code available for payment source | Admitting: Occupational Therapy

## 2021-03-04 ENCOUNTER — Ambulatory Visit: Payer: No Typology Code available for payment source | Admitting: Physical Therapy

## 2021-03-04 ENCOUNTER — Other Ambulatory Visit: Payer: Self-pay

## 2021-03-04 ENCOUNTER — Encounter: Payer: Self-pay | Admitting: Physical Therapy

## 2021-03-04 ENCOUNTER — Encounter: Payer: Self-pay | Admitting: Occupational Therapy

## 2021-03-04 DIAGNOSIS — M25621 Stiffness of right elbow, not elsewhere classified: Secondary | ICD-10-CM

## 2021-03-04 DIAGNOSIS — G463 Brain stem stroke syndrome: Secondary | ICD-10-CM | POA: Diagnosis not present

## 2021-03-04 DIAGNOSIS — R41840 Attention and concentration deficit: Secondary | ICD-10-CM

## 2021-03-04 DIAGNOSIS — I69351 Hemiplegia and hemiparesis following cerebral infarction affecting right dominant side: Secondary | ICD-10-CM

## 2021-03-04 DIAGNOSIS — R2681 Unsteadiness on feet: Secondary | ICD-10-CM

## 2021-03-04 DIAGNOSIS — M25611 Stiffness of right shoulder, not elsewhere classified: Secondary | ICD-10-CM

## 2021-03-04 DIAGNOSIS — R278 Other lack of coordination: Secondary | ICD-10-CM

## 2021-03-04 DIAGNOSIS — M6281 Muscle weakness (generalized): Secondary | ICD-10-CM

## 2021-03-04 NOTE — Patient Instructions (Signed)
° °  Self Passive Range of Motion  ° °While lying down, clasp hands together and reach towards headboard for a stretch for your shoulder. Repeat 10 times and do 2-3 times a day. ° °While lying down, clasp hands together and reach side to side for a stretch for your shoulder. Repeat 10 times and do 2-3 times a day. ° °While lying down, clasp hands together and reach up towards the ceiling for a stretch for your shoulder. Repeat 10 times and do 2-3 times a day.  °

## 2021-03-04 NOTE — Progress Notes (Signed)
Carelink Summary Report / Loop Recorder 
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
## 2021-03-04 NOTE — Therapy (Addendum)
[facility] [address] [place], [place], [address] Phone: [phone]   Fax:  [phone]  Physical Therapy Treatment/Re-Cert  Patient Details  Name: Trevor Anderson MRN: [id number] Date of Birth: 1955-11-29 Referring Provider (PT): Deland Pretty   Encounter Date: 03/04/2021    03/04/21 1427  PT Visits / Re-Eval  Visit Number 20  Number of Visits 30  Date for PT Re-Evaluation 76/80/88 (per recert on 02/24/14)  Authorization  Authorization Type [facility] auth# [id number] (15 visits); [facility] has approved additional 15 PT visits to 05/26/2021  Authorization - Visit Number 5  Authorization - Number of Visits 15  PT Time Calculation  PT Start Time 1315  PT Stop Time 1402  PT Time Calculation (min) 47 min  PT - End of Session  Equipment Utilized During Treatment Gait belt  Activity Tolerance Patient tolerated treatment well;Patient limited by fatigue  Behavior During Therapy [facility] for tasks assessed/performed   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   CONGESTIVE HEART FAILURE 11/12/2007   DIABETES MELLITUS, TYPE II 11/20/2008   HYPERLIPIDEMIA 11/12/2007   HYPERTENSION 11/12/2007   TACHYCARDIA 01/24/2010    Past Surgical History:  Procedure Laterality Date   BUBBLE STUDY  03/16/2020   Procedure: BUBBLE STUDY;  Surgeon: Buford Dresser, MD;  Location: [facility];  Service: Cardiovascular;;   LOOP RECORDER INSERTION N/A 03/23/2020   Procedure: LOOP RECORDER INSERTION;  Surgeon: Thompson Grayer, MD;  Location: [facility];  Service: Cardiovascular;  Laterality: N/A;   TEE WITHOUT CARDIOVERSION N/A 03/16/2020   Procedure: TRANSESOPHAGEAL ECHOCARDIOGRAM (TEE);  Surgeon: Buford Dresser, MD;  Location: [facility] ENDOSCOPY;  Service: Cardiovascular;  Laterality: N/A;   TONSILLECTOMY  1970    There were no vitals filed for this visit.   Subjective Assessment - 03/04/21 1317      Subjective No new falls, complaints, or pain. Pts son reports "No standing at home, just the transfers. His transfers are getting better."    Patient is accompained by: Family member   Son   Limitations Standing    How long can you stand comfortably? 5-10 minutes    Patient Stated Goals Open/close hand; Get more R LE motion;    Currently in Pain? No/denies    Pain Score 0-No pain    Pain Onset Today               03/08/21 0921  Assessment  Medical Diagnosis CVA affecting R side  Referring Provider (PT) Deland Pretty  Onset Date/Surgical Date  (November 2021)  Prior Function  Level of Independence Needs assistance with ADLs;Needs assistance with homemaking;Needs assistance with gait;Needs assistance with tranfers       [facility] Adult PT Treatment/Exercise - 03/04/21 1335       Transfers   Transfers Sit to Stand;Stand to Sit    Sit to Stand 3: Mod assist;4: Min assist;With upper extremity assist;From bed;From chair/3-in-1    Sit to Stand Details Tactile cues for weight shifting;Tactile cues for placement;Verbal cues for technique;Verbal cues for sequencing    Sit to Stand Details (indicate cue type and reason) Cues and min A to scoot to E edge of table, manual assist to place R UE on handle of RW and to position RLE on floor, as well as cues to rock forward before boosting into standing. The pt required min/mod    Stand to Sit 4: Min assist;3: Mod assist    Stand to Sit Details (indicate cue type and  reason) Verbal cues for sequencing;Verbal cues for technique    Stand to Sit Details Cues to reach back with LUE    Lateral/Scoot Transfers 4: Min assist;3: Mod assist    Lateral/Scoot Transfer Details (indicate cue type and reason) cues and min a to lateral scoot to L. Mod A and cues to lateral scoot to R.    Comments Put on bluerocker AFO before standing activities/transfers      Ambulation/Gait   Ambulation/Gait Yes    Ambulation/Gait Assistance 3: Mod assist   of 2  persons with WC follow   Ambulation/Gait Assistance Details use of bluerocker, theraband on LE to fcilitate dosrsi/knee/hip flexion, and toe cap on R. The pt required cues and mod manual assistance for weight shifting to R as well as cues to stand over and lock out R knee. Pt required min assist to advance RLE and min guard/assist at R knee. Pt also required verbal cues to stand tall. Pt ambulated 5 ft forward with WC follow.    Ambulation Distance (Feet) 5 Feet    Assistive device Rolling walker;Other (Comment)   Bluerocker AFO, theraband on LE to facilitate dorsi/hip/knee flexion, and toe cap. All on R   Gait Pattern Step-to pattern;Decreased stride length;Decreased step length - right;Decreased step length - left;Decreased stance time - right;Decreased hip/knee flexion - right;Decreased dorsiflexion - right;Decreased weight shift to right;Right flexed knee in stance;Decreased trunk rotation;Narrow base of support;Poor foot clearance - right    Ambulation Surface Level;Indoor      Neuro Re-ed    Neuro Re-ed Details  Standing with RW: steps forward/back with LLE while min guarding R Knee 2 sets x 5 reps. The pt required mod assist for weight shifting to R. With Theraband to facilitate dorsi/knee/hip flexion, and toe cap on R and RW: pt performed ~5 steps forward/back with min A to advance RLE      Exercises   Exercises Other Exercises    Other Exercises  Prior to donning AFO and attempting transfers from mat table - R heel cord/hamstring stretching 3 x 30 seconds. Therapist helping stretch out pt's R hand/fingers before holding onto RW. ~10 reps foot slides forward/back with RLE on pillow case with assist needed throughout full ROM                PT Short Term Goals - 02/14/21 1806       PT SHORT TERM GOAL #1   Title Pt will perform HEP with caregiver assistance, for improved flexibility, strength, transfers.  TARGET 02/04/2021    Baseline reports completing 2-3x/week with caregiver  assistance    Time 4    Period Weeks    Status Achieved      PT SHORT TERM GOAL #2   Title Pt will perform sit to stand transfer with mod assist.    Baseline currently max assist with sit<>stand; continue to require max A for transfer on 10/24    Time 4    Period Weeks    Status Not Met      PT SHORT TERM GOAL #3   Title Caregiver will verbalize improved stand pivot transfers from w/c<>mat/bed with mod assist (at least 50% of the time).    Baseline mod/max with sliding board, has just improved to mod assist in clinic 01/06/2021; unable to assess due to caregiver not present at session on 10/24, will address at next session    Time 4    Period Weeks    Status On-going  Revised/ongoing STGs for re-cert:   PT Short Term Goals - 03/08/21 1307       PT SHORT TERM GOAL #1   Title Pt will continue to perform HEP with caregiver assistance, for improved flexibility, strength, transfers.    Baseline reports completing 2-3x/week with caregiver assistance    Time 4    Period Weeks    Status On-going    Target Date 04/05/21      PT SHORT TERM GOAL #2   Title Pt will perform sit to stand transfer with min/mod assist from mat table with RW.    Baseline needs mod/max A (depending on the day)    Time 4    Period Weeks    Status Revised      PT SHORT TERM GOAL #3   Title Pt will perform stand pivot transfers with RW from w/c <> mat table with mod A x1 in order to demo improved functional transfers.    Baseline --    Time 4    Period Weeks    Status New      PT SHORT TERM GOAL #4   Title Pt will ambulate at least 20' with RW with min-mod A +2 in order to demo improved functional mobility.    Baseline 5' of mod A +2    Time 4    Period Weeks    Status New               PT Long Term Goals - 03/04/21 1434       PT LONG TERM GOAL #1   Title Pt will be able to perform progression of/finalized HEP with caregiver assist,  TARGET 03/04/21    Baseline pt's son  reports working on exercises at home with caregiver.    Time 8    Period Weeks    Status Revised      PT LONG TERM GOAL #2   Title Pt will be able to perform all edge of bed scooting with min A for improved ease of transfers, decreased caregiver burden    Baseline 03/04/2021: pt requires cues and min assist to laterally scoot to L, and mod assist and cues to laterally scoot to R    Time 8    Period Weeks    Status Partially Met      PT LONG TERM GOAL #3   Title Caregiver will report standing at sink or RW at home, for at least 5 minutes, at least 5 days per week, with min guard>supervision, for improved standing tolerance for ADL participation.    Baseline 03/04/2021: pt's son reports not much standing at home, other than transfers. However, transfers are getting easier.    Time 8    Period Weeks    Status Partially Met      PT LONG TERM GOAL #4   Title Pt will be able to perform wheelchair transfer with min A for improved independence, decreased caregiver burden    Baseline 02/18/2021: pt required mod assist to perform stand pivot transfer. Was not tested on 03/04/2021 due to fatigue.    Time 8    Period Weeks    Status Partially Met      PT LONG TERM GOAL #5   Title Pt/family/caregiver will verbalize plans for continued community fitness upon d/c from PT to maximize gains made in therapy.    Baseline 03/04/2021: Pt being submitted for recert, will address at d/c    Time 8    Period Weeks  Status New            Revised/ongoing LTGs for re-cert:  PT Long Term Goals - 03/04/21 1434       PT LONG TERM GOAL #1   Title Pt will be able to perform progression of/finalized HEP and perform standing consistently at home with caregiver assist,  TARGET 05/03/21    Baseline pt's son reports working on exercises at home with caregiver - pt will benefit from ongoing revisions/additions, pt not performing standing at home with caregivers.    Time 8    Period Weeks    Status Revised     Target Date 05/03/21      PT LONG TERM GOAL #2   Title Pt will be able to perform all edge of bed scooting with min A for improved ease of transfers, decreased caregiver burden    Baseline 03/04/2021: pt requires cues and min assist to laterally scoot to L, and mod assist and cues to laterally scoot to R    Time 8    Period Weeks    Status On-going      PT LONG TERM GOAL #3   Title Caregiver will report standing at sink or RW at home, for at least 5 minutes, at least 5 days per week, with min guard>supervision, for improved standing tolerance for ADL participation.    Baseline 03/04/2021: pt's son reports not much standing at home    Time 8    Period Weeks    Status On-going      PT LONG TERM GOAL #4   Title Pt will be able to perform wheelchair transfer standpivot with min A for improved independence vs. sliding board transfer with min guard, in order to decrease caregiver burden    Baseline pt needing mod A to perform transfer    Time 8    Period Weeks    Status On-going      PT LONG TERM GOAL #5   Title Pt will ambulate at least 30' with RW with min A +2 in order to demo improved functional mobility.    Baseline 5' of mod A +2    Time 8    Period Weeks    Status New      Additional Long Term Goals   Additional Long Term Goals Yes      PT LONG TERM GOAL #6   Title Pt will perform sit <> stands with RW and min A in order to demo improved functional transfers to decr caregiver burden.    Time 8    Period Weeks    Status New               03/04/21 1428  Plan  Clinical Impression Statement Today's skille session was focussed on checking LTGs to sent to PT for recert, and working on weight shifting in standing and gait with RW. The pt required min-mod assist to perform tasks with heavy cues and some manual assist for placement of R UE/LE. The pt could continue to benefit from further skilled PT to address remaining functional deficits. From primary PT: Pt partially  meeting LTGs #2-#4. Pt able to perform min A to scoot towards L and mod A with scooting towards R. Pt has not been performing standing at home for incr standing tolerance. Pt needs mod/min A to stand from mat table with use of R AFO and needs up to mod/max A to perform stand pivot transfer with RW from w/c <> mat table.  Pt continutes with incr tone/spasticity in RLE that varies between sessions and then pt needing incr assist with transfers and standing. Pt has progressed to be able to begin gait training with use of blue rocker AFO, needing mod A of 2 therapists and a close w/c follow. Pt needing assist to advance RLE and block R knee. Pt will continue to benefit from skilled PT in order to work on strengthening, ROM, functional transfers, gait, standing tolerance, balance in order to improve functional mobility and independence and decr caregiver burden. STGs/LTGs updated as appropriate.  Personal Factors and Comorbidities Age;Time since onset of injury/illness/exacerbation  Examination-Activity Limitations Locomotion Level;Transfers;Sit;Stand;Toileting;Dressing;Hygiene/Grooming;Self Feeding;Bed Mobility;Bend;Bathing  Examination-Participation Restrictions Community Activity;Shop;Meal Prep;Yard Work  Pt will benefit from skilled therapeutic intervention in order to improve on the following deficits Abnormal gait;Difficulty walking;Impaired tone;Decreased range of motion;Decreased coordination;Decreased endurance;Impaired UE functional use;Decreased activity tolerance;Pain;Decreased balance;Decreased mobility;Decreased strength;Impaired sensation;Postural dysfunction  Stability/Clinical Decision Making Evolving/Moderate complexity  Rehab Potential Fair  PT Frequency 2x / week  PT Duration 8 weeks  PT Treatment/Interventions ADLs/Self Care Home Management;Electrical Stimulation;DME Instruction;Gait training;Functional mobility training;Therapeutic activities;Therapeutic exercise;Balance  training;Neuromuscular re-education;Manual techniques;Patient/family education;Orthotic Fit/Training;Wheelchair mobility training;Passive range of motion;Dry needling;Taping  PT Next Visit Plan Try standing in parallel bars or at sink to increase standing time and UE support for standing balance activities. continue to work on LE strengthening, stand pivot transfers with RW, standing balance with RW, pre gait activities. LE stretching to assist with tone/spasticity management priort to any standing or transfers. continue to work on gait with 2 person assit/Bluerocker AFO. would sliding board be good to work on for transfers at home?  Consulted and Agree with Plan of Care Patient;Family member/caregiver  Family Member Consulted Son     Patient will benefit from skilled therapeutic intervention in order to improve the following deficits and impairments:  Abnormal gait, Difficulty walking, Impaired tone, Decreased range of motion, Decreased coordination, Decreased endurance, Impaired UE functional use, Decreased activity tolerance, Pain, Decreased balance, Decreased mobility, Decreased strength, Impaired sensation, Postural dysfunction  Visit Diagnosis: Unsteadiness on feet  Muscle weakness (generalized)  Hemiplegia and hemiparesis following cerebral infarction affecting right dominant side [facility])     Problem List Patient Active Problem List   Diagnosis Date Noted   Cerebrovascular accident (CVA) ([place])    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   Hypertensive urgency 03/12/2020   Acute on chronic systolic CHF (congestive heart failure) ([place]) 03/12/2020   AKI (acute kidney injury) ([place]) 03/12/2020   Fall at home, initial encounter 03/12/2020   Right hip pain 03/12/2020   Hypokalemia 03/12/2020   Coronary artery disease involving native coronary artery of native heart without angina pectoris 09/07/2014   Renal insufficiency 12/02/2012   Preventative health care 11/13/2010    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 [place], [place], [address] Phone: [phone]   Fax:  [phone]  Name: Trevor Anderson MRN: [id number] Date of Birth: Aug 21, 1955  This note has been reviewed and edited by supervising CI.   Willow Ora, PTA, [facility] [address] [place], [place] [address] [phone] 03/07/21, 4:39 PM    Addendum by: Janann August, PT, DPT 03/08/21 12:55 PM

## 2021-03-08 ENCOUNTER — Ambulatory Visit: Payer: No Typology Code available for payment source | Admitting: Physical Therapy

## 2021-03-08 ENCOUNTER — Other Ambulatory Visit: Payer: Self-pay

## 2021-03-08 ENCOUNTER — Encounter: Payer: Self-pay | Admitting: Physical Therapy

## 2021-03-08 ENCOUNTER — Ambulatory Visit: Payer: No Typology Code available for payment source | Admitting: Occupational Therapy

## 2021-03-08 DIAGNOSIS — R2681 Unsteadiness on feet: Secondary | ICD-10-CM

## 2021-03-08 DIAGNOSIS — G463 Brain stem stroke syndrome: Secondary | ICD-10-CM | POA: Diagnosis not present

## 2021-03-08 DIAGNOSIS — M6281 Muscle weakness (generalized): Secondary | ICD-10-CM

## 2021-03-08 DIAGNOSIS — M25621 Stiffness of right elbow, not elsewhere classified: Secondary | ICD-10-CM

## 2021-03-08 DIAGNOSIS — R278 Other lack of coordination: Secondary | ICD-10-CM

## 2021-03-08 DIAGNOSIS — M25611 Stiffness of right shoulder, not elsewhere classified: Secondary | ICD-10-CM

## 2021-03-08 DIAGNOSIS — I69351 Hemiplegia and hemiparesis following cerebral infarction affecting right dominant side: Secondary | ICD-10-CM

## 2021-03-08 DIAGNOSIS — R41840 Attention and concentration deficit: Secondary | ICD-10-CM

## 2021-03-08 NOTE — Addendum Note (Signed)
Addended by: Drake Leach on: 03/08/2021 02:19 PM   Modules accepted: Orders

## 2021-03-08 NOTE — Therapy (Signed)
[facility] [address] [place], [place], [address] Phone: [phone]   Fax:  [phone]  Occupational Therapy Treatment & Recertification  Patient Details  Name: Trevor Anderson MRN: [id number] Date of Birth: [birth year] Referring Provider (OT): Deland Pretty, NP   Encounter Date: 03/08/2021   OT End of Session - 03/08/21 1239     Visit Number 14    Number of Visits 15    Date for OT Re-Evaluation 05/31/21   +12 weeks   Authorization Type VA    Authorization Time Period 15 visits approved 01/12/21 - 04/27/21    Authorization - Visit Number 14    Authorization - Number of Visits 15    Progress Note Due on Visit --    OT Start Time 1237    OT Stop Time 1315    OT Time Calculation (min) 38 min    Activity Tolerance Patient tolerated treatment well;Patient limited by pain    Behavior During Therapy [facility] for tasks assessed/performed             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   CONGESTIVE HEART FAILURE 11/12/2007   DIABETES MELLITUS, TYPE II 11/20/2008   HYPERLIPIDEMIA 11/12/2007   HYPERTENSION 11/12/2007   TACHYCARDIA 01/24/2010    Past Surgical History:  Procedure Laterality Date   BUBBLE STUDY  03/16/2020   Procedure: BUBBLE STUDY;  Surgeon: Buford Dresser, MD;  Location: [place];  Service: Cardiovascular;;   LOOP RECORDER INSERTION N/A 03/23/2020   Procedure: LOOP RECORDER INSERTION;  Surgeon: Thompson Grayer, MD;  Location: [place] CV LAB;  Service: Cardiovascular;  Laterality: N/A;   TEE WITHOUT CARDIOVERSION N/A 03/16/2020   Procedure: TRANSESOPHAGEAL ECHOCARDIOGRAM (TEE);  Surgeon: Buford Dresser, MD;  Location: [facility] ENDOSCOPY;  Service: Cardiovascular;  Laterality: N/A;   TONSILLECTOMY  1970    There were no vitals filed for this visit.   Subjective Assessment - 03/08/21 1238     Subjective  "my steelers won this weekend"    Patient  is accompanied by: --   caregiver Trevor Anderson   Pertinent History HLD, HTN, DM, CHF    Limitations Fall Risk. Max/Total stand pivot transfer w RW. Loop Recorder (no ESTIM)    Patient Stated Goals to move it (RUE)    Currently in Pain? No/denies    Pain Score 0-No pain             Reviewed goals for recertification. See below.  Manual Therapy PROM with AAROM for RUE shoulder, elbow, wrist and hand.                        OT Short Term Goals - 03/08/21 1250       OT SHORT TERM GOAL #1   Title Pt and caregivers will be independent with HEP    Time 4    Period Weeks    Status Achieved    Target Date 02/09/21      OT SHORT TERM GOAL #2   Title Pt will complete UB dressing with min A for pull over short sleeve shirt.    Baseline mod A    Time 4    Period Weeks    Status Achieved   per pt report - completing with min A for hemi arm.     OT SHORT TERM GOAL #3   Title Pt and caregiver will verbalize understanding of adapted strategies and/or equipment for completing toilet  and tub transfers with mod A consistently.    Baseline currently mod/max and not getting to shower.    Time 4    Period Weeks    Status Not Met   max A for transfers and unable to fully get to tub bench and shower safely at this time and not getting on [facility] consistently at home. still using bed pan. 03/08/21     OT SHORT TERM GOAL #4   Title Pt will achieve 90* of PROM for RUE shoulder flexion to reduce risk of malpositioning and increased pain.    Time 4    Period Weeks    Status Achieved   90* with RUE shoulder flexion 03/08/21     OT SHORT TERM GOAL #5   Title Pt and caregivers will verbalize and demonstrate understanding of wear and care of any splints and/or orthoses PRN.    Time 4    Period Weeks    Status Achieved               OT Long Term Goals - 03/08/21 1253       OT LONG TERM GOAL #1   Title Pt and caregivers will be independent with and updated HEPs    Time 10     Period Weeks    Status On-going      OT LONG TERM GOAL #2   Title Pt will complete toilet and tub transfers consistently with min assistance with adapted strategies and equipment PRN    Baseline mod A for stand pivot    Time 10    Period Weeks    Status Deferred   Pt consistently mod - max transfer. 03/08/21     OT LONG TERM GOAL #3   Title Pt will complete bathing with min A    Baseline mod-max    Time 10    Period Weeks    Status On-going   simulated min A in the clinic 03/08/21 - pt able to reach 75% with washcloth while seated in tilt in space chair.     OT LONG TERM GOAL #4   Title Pt will demonstrate composite flexion/extension of 15% or greater in RUE for preparing for active grasp/release and increasing functional use.    Baseline trace extension    Time 10    Period Weeks    Status On-going   trace extension/approx 10% flexion 03/08/21     OT LONG TERM GOAL #5   Title Pt will achieve -20 degrees of elbow extension in RUE of PROM or decreasing risk of skin breakdown    Baseline -40*    Time 10    Period Weeks    Status On-going   -25* 03/08/21     Long Term Additional Goals   Additional Long Term Goals Yes      OT LONG TERM GOAL #6   Title Pt and son will verbalize understanding of home modifications/equipment and/or adapted strategies for increasing independence with transfers for bathing and toileting and increasing safety and independence with ADLs/IADLs. (BSC, sponge baths vs tub bench, long handled sponge, cutting food, etc)    Time 12    Period Weeks    Status New    Target Date 05/31/21                   Plan - 03/08/21 1301     Clinical Impression Statement This note to serve for recertification for visits during period 01/12/21 - 03/08/21. Pt has progressed  towards increasing range of motion in RUE but continues to be limited by spasticity and limited hip range of motion for increased independence with transfers and ADLs/IADLs. Pt has met 4/5 STGs  and is progressing towards LTGs. Updated goals. Skilled occupational therapy is recommended to target RUE range of motion, increasing independence with transfers and caregiver training. Pt would benefit from Botox/Dysport to RUE for increasing range of motion decreasing spasticity and neuromuscular reeducation.    OT Occupational Profile and History Problem Focused Assessment - Including review of records relating to presenting problem    Occupational performance deficits (Please refer to evaluation for details): IADL's;ADL's;Leisure    Body Structure / Function / Physical Skills Tone;Strength;ADL;FMC;Mobility;GMC;ROM;IADL;UE functional use;Decreased knowledge of use of DME    Cognitive Skills Problem Solve    Rehab Potential Good    Clinical Decision Making Limited treatment options, no task modification necessary    Comorbidities Affecting Occupational Performance: None    Modification or Assistance to Complete Evaluation  No modification of tasks or assist necessary to complete eval    OT Frequency 2x / week    OT Duration 12 weeks   16 visits after 11/15 pending VA auth/approval   OT Treatment/Interventions Self-care/ADL training;Manual Therapy;Patient/family education;Electrical Stimulation;Neuromuscular education;Functional Mobility Training;Passive range of motion;Cognitive remediation/compensation;Therapeutic exercise;Moist Heat;DME and/or AE instruction;Therapeutic activities;Aquatic Therapy;Splinting    Plan did recertifiction 00/86 ... submitted for 15 more visits from [place] 11/2 - awaiting approval. Needs to go on hold after visit 15 until receive approval.    OT Home Exercise Plan self PROM    Consulted and Agree with Plan of Care Patient;Family member/caregiver    Family Member Consulted son, Trevor Anderson             Patient will benefit from skilled therapeutic intervention in order to improve the following deficits and impairments:   Body Structure / Function / Physical Skills: Tone,  Strength, ADL, FMC, Mobility, GMC, ROM, IADL, UE functional use, Decreased knowledge of use of DME Cognitive Skills: Problem Solve     Visit Diagnosis: Unsteadiness on feet  Hemiplegia and hemiparesis following cerebral infarction affecting right dominant side (HCC)  Stiffness of right shoulder, not elsewhere classified  Stiffness of right elbow, not elsewhere classified  Muscle weakness (generalized)  Other lack of coordination  Attention and concentration deficit    Problem List Patient Active Problem List   Diagnosis Date Noted   Cerebrovascular accident (CVA) ([place])    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   Hypertensive urgency 03/12/2020   Acute on chronic systolic CHF (congestive heart failure) ([place]) 03/12/2020   AKI (acute Anderson injury) ([place]) 03/12/2020   Fall at home, initial encounter 03/12/2020   Right hip pain 03/12/2020   Hypokalemia 03/12/2020   Coronary artery disease involving native coronary artery of native heart without angina pectoris 09/07/2014   Renal insufficiency 12/02/2012   Preventative health care 11/13/2010   Secondary cardiomyopathy ([place]) 01/24/2010   TACHYCARDIA 01/24/2010   Type 2 diabetes mellitus without complication, with long-term current use of insulin ([place]) 11/20/2008   Mixed hyperlipidemia 11/12/2007   Essential hypertension 11/12/2007   CONGESTIVE HEART FAILURE 11/12/2007    Trevor Anderson, Trevor Anderson 03/08/2021, 1:49 PM  [facility] [address] [place], [place], [address] Phone: [phone]   Fax:  [phone]  Name: Trevor Anderson MRN: [id number] Date of Birth: 1956-03-08

## 2021-03-09 NOTE — Therapy (Signed)
[facility] [address] [place], [place], [address] Phone: [phone]   Fax:  [phone]  Physical Therapy Treatment  Patient Details  Name: Trevor Anderson MRN: [id number] Date of Birth: 1956-03-13 Referring Provider (PT): Noel Gerold   Encounter Date: 03/08/2021   PT End of Session - 03/08/21 1318     Visit Number 21    Number of Visits 30    Date for PT Re-Evaluation 05/03/21   per recert on 03/04/21   Authorization Type VA auth# [id number] (15 visits); VA has approved additional 15 PT visits to 05/26/2021    Authorization - Visit Number 6    Authorization - Number of Visits 15    PT Start Time 1316    PT Stop Time 1400    PT Time Calculation (min) 44 min    Equipment Utilized During Treatment Gait belt    Activity Tolerance Patient tolerated treatment well;Patient limited by fatigue    Behavior During Therapy [facility] for tasks assessed/performed             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   CONGESTIVE HEART FAILURE 11/12/2007   DIABETES MELLITUS, TYPE II 11/20/2008   HYPERLIPIDEMIA 11/12/2007   HYPERTENSION 11/12/2007   TACHYCARDIA 01/24/2010    Past Surgical History:  Procedure Laterality Date   BUBBLE STUDY  03/16/2020   Procedure: BUBBLE STUDY;  Surgeon: Jodelle Red, MD;  Location: [facility] ENDOSCOPY;  Service: Cardiovascular;;   LOOP RECORDER INSERTION N/A 03/23/2020   Procedure: LOOP RECORDER INSERTION;  Surgeon: Hillis Range, MD;  Location: [facility] INVASIVE CV LAB;  Service: Cardiovascular;  Laterality: N/A;   TEE WITHOUT CARDIOVERSION N/A 03/16/2020   Procedure: TRANSESOPHAGEAL ECHOCARDIOGRAM (TEE);  Surgeon: Jodelle Red, MD;  Location: [facility] ENDOSCOPY;  Service: Cardiovascular;  Laterality: N/A;   TONSILLECTOMY  1970    There were no vitals filed for this visit.   Subjective Assessment - 03/08/21 1317     Subjective No new complaitns.  No falls.    Patient is accompained by: Thurston Hole, Caregiver   Limitations Standing    How long can you stand comfortably? 5-10 minutes    Patient Stated Goals Open/close hand; Get more R LE motion;    Currently in Pain? No/denies    Pain Score 0-No pain               03/08/21 1318  Transfers  Transfers Sit to Stand;Stand to Sit  Sit to Stand 4: Min assist;3: Mod assist;With upper extremity assist;From bed;From chair/3-in-1  Sit to Stand Details Tactile cues for weight shifting;Tactile cues for placement;Verbal cues for technique;Verbal cues for sequencing  Sit to Stand Details (indicate cue type and reason) cues to scoot to edge of surface, for LE position and for anterior weight shifitng while "powering up" through LE's. PTA using her foot to keep right LE in place (prevent adduction/flexion due to tone/spasticity) with each stand  Stand to Sit 4: Min assist;3: Mod assist;With upper extremity assist;To bed;To chair/3-in-1  Stand to Sit Details (indicate cue type and reason) Verbal cues for sequencing;Verbal cues for technique  Stand to Sit Details cues to reach back and use UE for controlled descent with assistance needed  Stand Pivot Transfers 3: Mod assist;2: Max assist (with 2cd person for safety)  Stand Pivot Transfer Details (indicate cue type and reason) with RW wheelchair<>mat table with cues/assist/facilitation for posture, step placement, walker position and weight shifitng.  Ambulation/Gait  Ambulation/Gait  Yes  Ambulation/Gait Assistance 4: Min assist (min asssit of 2 therapists)  Ambulation/Gait Assistance Details continued with use of bluerocker AFO. cues/facilitaiton for posture, weight shifitng, right LE advancment/step placement. cues for hip/knee extension, walker placement with gait.  Ambulation Distance (Feet) 15 Feet (x1, 10 x1, 5 x1)  Assistive device Rolling walker;Other (Comment)  Gait Pattern Step-to pattern;Decreased stride length;Decreased step length -  right;Decreased step length - left;Decreased stance time - right;Decreased hip/knee flexion - right;Decreased dorsiflexion - right;Decreased weight shift to right;Right flexed knee in stance;Decreased trunk rotation;Narrow base of support;Poor foot clearance - right  Ambulation Surface Level  Neuro Re-ed   Neuro Re-ed Details  for balance/muscle re-ed: standing with RW support working on achieving tall posture, progressing to weight shifitng with cues/facilitation for posture (trunk, hip and knee extension). Progressed to working on forward/backward stepping in place with cues for right hip/knee extension in stance with assist/facilitation and then assist with right step placement in left LE stance phase.  Exercises  Exercises Other Exercises  Other Exercises  seated in wheelchair: passive stretching of right heel cord and hamstrings for decreased muscle tightness and spasms. also worked on passive stretching of right hand/wrist for improved ability to hold walker handle.        PT Short Term Goals - 03/08/21 1307       PT SHORT TERM GOAL #1   Title Pt will continue to perform HEP with caregiver assistance, for improved flexibility, strength, transfers.    Baseline reports completing 2-3x/week with caregiver assistance    Time 4    Period Weeks    Status On-going    Target Date 04/05/21      PT SHORT TERM GOAL #2   Title Pt will perform sit to stand transfer with min/mod assist from mat table with RW.    Baseline needs mod/max A (depending on the day)    Time 4    Period Weeks    Status Revised      PT SHORT TERM GOAL #3   Title Pt will perform stand pivot transfers with RW from w/c <> mat table with mod A x1 in order to demo improved functional transfers.    Baseline --    Time 4    Period Weeks    Status New      PT SHORT TERM GOAL #4   Title Pt will ambulate at least 20' with RW with min-mod A +2 in order to demo improved functional mobility.    Baseline 5' of mod A +2     Time 4    Period Weeks    Status New               PT Long Term Goals - 03/04/21 1434       PT LONG TERM GOAL #1   Title Pt will be able to perform progression of/finalized HEP and perform standing consistently at home with caregiver assist,  TARGET 05/03/21    Baseline pt's son reports working on exercises at home with caregiver - pt will benefit from ongoing revisions/additions, pt not performing standing at home with caregivers.    Time 8    Period Weeks    Status Revised    Target Date 05/03/21      PT LONG TERM GOAL #2   Title Pt will be able to perform all edge of bed scooting with min A for improved ease of transfers, decreased caregiver burden    Baseline 03/04/2021: pt requires cues  and min assist to laterally scoot to L, and mod assist and cues to laterally scoot to R    Time 8    Period Weeks    Status On-going      PT LONG TERM GOAL #3   Title Caregiver will report standing at sink or RW at home, for at least 5 minutes, at least 5 days per week, with min guard>supervision, for improved standing tolerance for ADL participation.    Baseline 03/04/2021: pt's son reports not much standing at home    Time 8    Period Weeks    Status On-going      PT LONG TERM GOAL #4   Title Pt will be able to perform wheelchair transfer standpivot with min A for improved independence vs. sliding board transfer with min guard, in order to decrease caregiver burden    Baseline pt needing mod A to perform transfer    Time 8    Period Weeks    Status On-going      PT LONG TERM GOAL #5   Title Pt will ambulate at least 30' with RW with min A +2 in order to demo improved functional mobility.    Baseline 5' of mod A +2    Time 8    Period Weeks    Status New      Additional Long Term Goals   Additional Long Term Goals Yes      PT LONG TERM GOAL #6   Title Pt will perform sit <> stands with RW and min A in order to demo improved functional transfers to decr caregiver burden.     Time 8    Period Weeks    Status New               03/08/21 1318  Plan  Clinical Impression Statement Today's skilled session continued to focus on transfers, standing balance and gait with RW. Pt continues to need up to mod/max assist with cues/facilitation on posture and weight shifting. The pt is making slow progress toward goals and should benefit from continued PT to progress toward unmet goals.  Personal Factors and Comorbidities Age;Time since onset of injury/illness/exacerbation  Examination-Activity Limitations Locomotion Level;Transfers;Sit;Stand;Toileting;Dressing;Hygiene/Grooming;Self Feeding;Bed Mobility;Bend;Bathing  Examination-Participation Restrictions Community Activity;Shop;Meal Prep;Yard Work  Pt will benefit from skilled therapeutic intervention in order to improve on the following deficits Abnormal gait;Difficulty walking;Impaired tone;Decreased range of motion;Decreased coordination;Decreased endurance;Impaired UE functional use;Decreased activity tolerance;Pain;Decreased balance;Decreased mobility;Decreased strength;Impaired sensation;Postural dysfunction  Stability/Clinical Decision Making Evolving/Moderate complexity  Rehab Potential Fair  PT Frequency 2x / week  PT Duration 8 weeks  PT Treatment/Interventions ADLs/Self Care Home Management;Electrical Stimulation;DME Instruction;Gait training;Functional mobility training;Therapeutic activities;Therapeutic exercise;Balance training;Neuromuscular re-education;Manual techniques;Patient/family education;Orthotic Fit/Training;Wheelchair mobility training;Passive range of motion;Dry needling;Taping  PT Next Visit Plan Try standing in parallel bars or at sink to increase standing time and UE support for standing balance activities. continue to work on LE strengthening, stand pivot transfers with RW, standing balance with RW, pre gait activities. LE stretching to assist with tone/spasticity management priort to any standing  or transfers. continue to work on gait with 2 person assit/Bluerocker AFO. would sliding board be good to work on for transfers at home?  Consulted and Agree with Plan of Care Patient;Family member/caregiver  Family Member Consulted Son          Patient will benefit from skilled therapeutic intervention in order to improve the following deficits and impairments:  Abnormal gait, Difficulty walking, Impaired tone, Decreased range of motion, Decreased  coordination, Decreased endurance, Impaired UE functional use, Decreased activity tolerance, Pain, Decreased balance, Decreased mobility, Decreased strength, Impaired sensation, Postural dysfunction  Visit Diagnosis: Unsteadiness on feet  Hemiplegia and hemiparesis following cerebral infarction affecting right dominant side (HCC)  Muscle weakness (generalized)     Problem List Patient Active Problem List   Diagnosis Date Noted   Cerebrovascular accident (CVA) (HCC)    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   Hypertensive urgency 03/12/2020   Acute on chronic systolic CHF (congestive heart failure) (HCC) 03/12/2020   AKI (acute kidney injury) (HCC) 03/12/2020   Fall at home, initial encounter 03/12/2020   Right hip pain 03/12/2020   Hypokalemia 03/12/2020   Coronary artery disease involving native coronary artery of native heart without angina pectoris 09/07/2014   Renal insufficiency 12/02/2012   Preventative health care 11/13/2010   Secondary cardiomyopathy (HCC) 01/24/2010   TACHYCARDIA 01/24/2010   Type 2 diabetes mellitus without complication, with long-term current use of insulin (HCC) 11/20/2008   Mixed hyperlipidemia 11/12/2007   Essential hypertension 11/12/2007   CONGESTIVE HEART FAILURE 11/12/2007    Sallyanne Kuster, PTA, [facility] [address] [place], [place] [address] [phone] 03/09/21, 8:22 PM   Name: GILLES TRIMPE MRN: [id number] Date of Birth:  May 04, 1955

## 2021-03-10 ENCOUNTER — Encounter: Payer: Self-pay | Admitting: Occupational Therapy

## 2021-03-10 ENCOUNTER — Encounter: Payer: Self-pay | Admitting: Physical Therapy

## 2021-03-10 ENCOUNTER — Ambulatory Visit: Payer: No Typology Code available for payment source | Admitting: Occupational Therapy

## 2021-03-10 ENCOUNTER — Ambulatory Visit: Payer: No Typology Code available for payment source | Admitting: Physical Therapy

## 2021-03-10 ENCOUNTER — Other Ambulatory Visit: Payer: Self-pay

## 2021-03-10 DIAGNOSIS — R2681 Unsteadiness on feet: Secondary | ICD-10-CM

## 2021-03-10 DIAGNOSIS — R41840 Attention and concentration deficit: Secondary | ICD-10-CM

## 2021-03-10 DIAGNOSIS — R293 Abnormal posture: Secondary | ICD-10-CM

## 2021-03-10 DIAGNOSIS — M25621 Stiffness of right elbow, not elsewhere classified: Secondary | ICD-10-CM

## 2021-03-10 DIAGNOSIS — G463 Brain stem stroke syndrome: Secondary | ICD-10-CM | POA: Diagnosis not present

## 2021-03-10 DIAGNOSIS — R278 Other lack of coordination: Secondary | ICD-10-CM

## 2021-03-10 DIAGNOSIS — I69351 Hemiplegia and hemiparesis following cerebral infarction affecting right dominant side: Secondary | ICD-10-CM

## 2021-03-10 DIAGNOSIS — M6281 Muscle weakness (generalized): Secondary | ICD-10-CM

## 2021-03-10 DIAGNOSIS — M25611 Stiffness of right shoulder, not elsewhere classified: Secondary | ICD-10-CM

## 2021-03-10 NOTE — Therapy (Signed)
[place] [place] [address] [place], [place], [address] Phone: [phone]   Fax:  [phone]  Occupational Therapy Treatment  Patient Details  Name: TIBERIUS LOFTUS MRN: [id number] Date of Birth: [birth year] Referring Provider (OT): Deland Pretty, NP   Encounter Date: 03/10/2021   OT End of Session - 03/10/21 1707     Visit Number 15    Number of Visits 15   AWAITING [place]   Date for OT Re-Evaluation 05/31/21    Authorization Type VA    Authorization Time Period 15 visits approved 01/12/21 - 04/27/21    Authorization - Visit Number 14    Authorization - Number of Visits 15    Progress Note Due on Visit 20    OT Start Time 1400    OT Stop Time 1445    OT Time Calculation (min) 45 min    Activity Tolerance Patient tolerated treatment well    Behavior During Therapy [facility] for tasks assessed/performed             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   CONGESTIVE HEART FAILURE 11/12/2007   DIABETES MELLITUS, TYPE II 11/20/2008   HYPERLIPIDEMIA 11/12/2007   HYPERTENSION 11/12/2007   TACHYCARDIA 01/24/2010    Past Surgical History:  Procedure Laterality Date   BUBBLE STUDY  03/16/2020   Procedure: BUBBLE STUDY;  Surgeon: Buford Dresser, MD;  Location: [facility];  Service: Cardiovascular;;   LOOP RECORDER INSERTION N/A 03/23/2020   Procedure: LOOP RECORDER INSERTION;  Surgeon: Thompson Grayer, MD;  Location: [facility];  Service: Cardiovascular;  Laterality: N/A;   TEE WITHOUT CARDIOVERSION N/A 03/16/2020   Procedure: TRANSESOPHAGEAL ECHOCARDIOGRAM (TEE);  Surgeon: Buford Dresser, MD;  Location: [facility] ENDOSCOPY;  Service: Cardiovascular;  Laterality: N/A;   TONSILLECTOMY  1970    There were no vitals filed for this visit.   Subjective Assessment - 03/10/21 1641     Subjective  Patient reports hip pain, and shoulder pain    Pertinent  History HLD, HTN, DM, CHF    Limitations Fall Risk. Max/Total stand pivot transfer w RW. Loop Recorder (no ESTIM)    Currently in Pain? Yes    Pain Score 1     Pain Location Shoulder    Pain Orientation Right    Pain Descriptors / Indicators Aching    Pain Type Acute pain    Pain Onset Today    Pain Frequency Intermittent    Aggravating Factors  flexing forward, weight shifting to right    Pain Relieving Factors reclining, rest                          OT Treatments/Exercises (OP) - 03/10/21 1644       ADLs   Functional Mobility Patient with inability to weight shift forward while in chair - chair in reclined position.  Once chair upright, patient working to pull self forward with left arm. Patient maintained weight to left and reported discomfort in right hip with all attempts to shift forward and right.  Patient did best with target to lean toward.  Patient needed cueing to remain forward.  Patient pulls strongly backward and described spasms in right hip.  Did min assist transfer multisquat using transfer board.  Patient remained in posterior tilt and attempts to have him sit upright toward right were painful.  Patient transferred to physioball to address more lower trunk initiated  movement.  This was effective in reducing tension in RLE and reducing hip pain, although patient very fearful.  Second person needed for effective strategy.  Loosening up right hip was effective to reduce tension in right trunk / RUE                      OT Short Term Goals - 03/10/21 1712       OT SHORT TERM GOAL #1   Title Pt and caregivers will be independent with HEP    Time 4    Period Weeks    Status Achieved    Target Date 02/09/21      OT SHORT TERM GOAL #2   Title Pt will complete UB dressing with min A for pull over short sleeve shirt.    Baseline mod A    Time 4    Period Weeks    Status Achieved   per pt report - completing with min A for hemi arm.     OT  SHORT TERM GOAL #3   Title Pt and caregiver will verbalize understanding of adapted strategies and/or equipment for completing toilet and tub transfers with mod A consistently.    Baseline currently mod/max and not getting to shower.    Time 4    Period Weeks    Status Not Met   max A for transfers and unable to fully get to tub bench and shower safely at this time and not getting on [facility] consistently at home. still using bed pan. 03/08/21     OT SHORT TERM GOAL #4   Title Pt will achieve 90* of PROM for RUE shoulder flexion to reduce risk of malpositioning and increased pain.    Time 4    Period Weeks    Status Achieved   90* with RUE shoulder flexion 03/08/21     OT SHORT TERM GOAL #5   Title Pt and caregivers will verbalize and demonstrate understanding of wear and care of any splints and/or orthoses PRN.    Time 4    Period Weeks    Status Achieved               OT Long Term Goals - 03/10/21 1712       OT LONG TERM GOAL #1   Title Pt and caregivers will be independent with and updated HEPs    Time 10    Period Weeks    Status On-going      OT LONG TERM GOAL #2   Title Pt will complete toilet and tub transfers consistently with min assistance with adapted strategies and equipment PRN    Baseline mod A for stand pivot    Time 10    Period Weeks    Status Deferred   Pt consistently mod - max transfer. 03/08/21     OT LONG TERM GOAL #3   Title Pt will complete bathing with min A    Baseline mod-max    Time 10    Period Weeks    Status On-going   simulated min A in the clinic 03/08/21 - pt able to reach 75% with washcloth while seated in tilt in space chair.     OT LONG TERM GOAL #4   Title Pt will demonstrate composite flexion/extension of 15% or greater in RUE for preparing for active grasp/release and increasing functional use.    Baseline trace extension    Time 10    Period Weeks  Status On-going   trace extension/approx 10% flexion 03/08/21     OT LONG  TERM GOAL #5   Title Pt will achieve -20 degrees of elbow extension in RUE of PROM or decreasing risk of skin breakdown    Baseline -40*    Time 10    Period Weeks    Status On-going   -25* 03/08/21     OT LONG TERM GOAL #6   Title Pt and son will verbalize understanding of home modifications/equipment and/or adapted strategies for increasing independence with transfers for bathing and toileting and increasing safety and independence with ADLs/IADLs. (BSC, sponge baths vs tub bench, long handled sponge, cutting food, etc)    Time 12    Period Weeks    Status New                   Plan - 03/10/21 1710     Clinical Impression Statement Patient continues to benefit from skilled OT intervention and awaits authorization for additional OT to continue to address particiaption in ADL and reduction in pain  / active movement in RUE    OT Occupational Profile and History Problem Focused Assessment - Including review of records relating to presenting problem    Occupational performance deficits (Please refer to evaluation for details): IADL's;ADL's;Leisure    Body Structure / Function / Physical Skills Tone;Strength;ADL;FMC;Mobility;GMC;ROM;IADL;UE functional use;Decreased knowledge of use of DME    Cognitive Skills Problem Solve    Rehab Potential Good    Clinical Decision Making Limited treatment options, no task modification necessary    Comorbidities Affecting Occupational Performance: None    Modification or Assistance to Complete Evaluation  No modification of tasks or assist necessary to complete eval    OT Frequency 2x / week    OT Duration 12 weeks   16 visits after 11/15 pending VA auth/approval   OT Treatment/Interventions Self-care/ADL training;Manual Therapy;Patient/family education;Electrical Stimulation;Neuromuscular education;Functional Mobility Training;Passive range of motion;Cognitive remediation/compensation;Therapeutic exercise;Moist Heat;DME and/or AE  instruction;Therapeutic activities;Aquatic Therapy;Splinting    Plan did recertifiction 16/60 ... submitted for 15 more visits from [place] 11/2 - awaiting approval. Needs to go on hold after visit 15 until receive approval.    OT Home Exercise Plan self PROM    Consulted and Agree with Plan of Care Patient;Family member/caregiver    Family Member Consulted son, Vishruth             Patient will benefit from skilled therapeutic intervention in order to improve the following deficits and impairments:   Body Structure / Function / Physical Skills: Tone, Strength, ADL, FMC, Mobility, GMC, ROM, IADL, UE functional use, Decreased knowledge of use of DME Cognitive Skills: Problem Solve     Visit Diagnosis: Hemiplegia and hemiparesis following cerebral infarction affecting right dominant side (HCC)  Muscle weakness (generalized)  Stiffness of right shoulder, not elsewhere classified  Stiffness of right elbow, not elsewhere classified  Other lack of coordination  Attention and concentration deficit  Unsteadiness on feet  Abnormal posture    Problem List Patient Active Problem List   Diagnosis Date Noted   Cerebrovascular accident (CVA) ([place])    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   Hypertensive urgency 03/12/2020   Acute on chronic systolic CHF (congestive heart failure) ([place]) 03/12/2020   AKI (acute kidney injury) ([place]) 03/12/2020   Fall at home, initial encounter 03/12/2020   Right hip pain 03/12/2020   Hypokalemia 03/12/2020   Coronary artery disease involving native coronary artery of native heart without  angina pectoris 09/07/2014   Renal insufficiency 12/02/2012   Preventative health care 11/13/2010   Secondary cardiomyopathy ([place]) 01/24/2010   TACHYCARDIA 01/24/2010   Type 2 diabetes mellitus without complication, with long-term current use of insulin ([place]) 11/20/2008   Mixed hyperlipidemia 11/12/2007   Essential hypertension 11/12/2007   CONGESTIVE  HEART FAILURE 11/12/2007    Mariah Milling, OT/L 03/10/2021, 5:12 PM  [facility] [address] [place], [place], [address] Phone: [phone]   Fax:  [phone]  Name: REYAAN THOMA MRN: [id number] Date of Birth: 01-04-1956

## 2021-03-10 NOTE — Therapy (Signed)
[facility] [address] [place], [place], [address] Phone: [phone]   Fax:  [phone]  Physical Therapy Treatment  Patient Details  Name: Trevor Anderson MRN: [id number] Date of Birth: 1955-05-20 Referring Provider (PT): Noel Gerold   Encounter Date: 03/10/2021   PT End of Session - 03/10/21 1653     Visit Number 22    Number of Visits 30    Date for PT Re-Evaluation 05/03/21   per recert on 03/04/21   Authorization Type VA auth# [id number] (15 visits); VA has approved additional 15 PT visits to 05/26/2021    Authorization - Visit Number 7    Authorization - Number of Visits 15    PT Start Time 1448    PT Stop Time 1527    PT Time Calculation (min) 39 min    Equipment Utilized During Treatment Gait belt;Other (comment)   [place] AFO   Activity Tolerance Patient tolerated treatment well;Patient limited by fatigue    Behavior During Therapy [facility] for tasks assessed/performed             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   CONGESTIVE HEART FAILURE 11/12/2007   DIABETES MELLITUS, TYPE II 11/20/2008   HYPERLIPIDEMIA 11/12/2007   HYPERTENSION 11/12/2007   TACHYCARDIA 01/24/2010    Past Surgical History:  Procedure Laterality Date   BUBBLE STUDY  03/16/2020   Procedure: BUBBLE STUDY;  Surgeon: Jodelle Red, MD;  Location: [facility] ENDOSCOPY;  Service: Cardiovascular;;   LOOP RECORDER INSERTION N/A 03/23/2020   Procedure: LOOP RECORDER INSERTION;  Surgeon: Hillis Range, MD;  Location: [facility] INVASIVE CV LAB;  Service: Cardiovascular;  Laterality: N/A;   TEE WITHOUT CARDIOVERSION N/A 03/16/2020   Procedure: TRANSESOPHAGEAL ECHOCARDIOGRAM (TEE);  Surgeon: Jodelle Red, MD;  Location: [facility] ENDOSCOPY;  Service: Cardiovascular;  Laterality: N/A;   TONSILLECTOMY  1970    There were no vitals filed for this visit.   Subjective Assessment - 03/10/21 1500      Subjective No new complaints. No falls. C/o dizziness when attempted to lay in supine on mat. Caregiver reported that he hadn't eaten anything today.    Patient is accompained by: Thurston Hole, Caregiver   Limitations Standing    How long can you stand comfortably? 5-10 minutes    Patient Stated Goals Open/close hand; Get more R LE motion;    Currently in Pain? Yes    Pain Score 1     Pain Location Shoulder    Pain Orientation Right    Pain Descriptors / Indicators Aching    Pain Type Acute pain    Pain Onset Today    Multiple Pain Sites Yes    Pain Score 1    Pain Location Hip    Pain Orientation Right    Pain Descriptors / Indicators Aching    Pain Type Acute pain    Pain Onset Today                  [facility] Adult PT Treatment/Exercise - 03/10/21 1451       Bed Mobility   Bed Mobility Sit to Supine;Right Sidelying to Sit    Right Sidelying to Sit Moderate Assistance - Patient 50-74%    Sit to Supine Moderate Assistance - Patient 50-74%      Transfers   Transfers Sit to Stand;Stand to Sit    Sit to Stand 4: Min assist;3: Mod assist;With upper extremity assist;From bed;From chair/3-in-1  with two persons.   Sit to Stand Details Tactile cues for weight shifting;Tactile cues for placement;Verbal cues for technique;Verbal cues for sequencing    Sit to Stand Details (indicate cue type and reason) Cues to scoot to edge of mat, cues and manual assist for LE position, and cues for weight shifting while "powering up" through LE's. Therapist provided approximation/facilitation to R knee and blocked R LE with foot to maintain position when standing. Manual assist was also needed to get R UE positioned on RW handle.    Stand to Sit 4: Min assist;3: Mod assist;With upper extremity assist;To bed;To chair/3-in-1    Stand to Sit Details (indicate cue type and reason) Verbal cues for sequencing;Verbal cues for technique    Stand to Sit Details cues to reach back to L UE and control  descent.    Stand Pivot Transfers 3: Mod assist    Stand Pivot Transfer Details (indicate cue type and reason) Mod assist +2 with RW to step/pivot to [facility]      Neuro Re-ed    Neuro Re-ed Details  for strengthening/muscle re-ed: standing with RW support working on weightshifting to R with tall posture. Performed tapping 2" step with L LE x 5 with mod assist with 2 persons to assist with weight shifting, maintaining trunk extension, and blocking/supporting R knee.      Exercises   Exercises Other Exercises    Other Exercises  seated on edge of mat: passive stretching of right heel cord and hamstrings for decreased muscle tightness and spasms. Attempted modified Thomas stretch in supine, however was deferred due to pt's c/o dizziness.      Knee/Hip Exercises: Stretches   Passive Hamstring Stretch --    Passive Hamstring Stretch Limitations --            Self Care: Pt with reports of dizziness at start of session after OT session. Assisted to supine with reports of resolution of symptoms. At this time caregiver/CNA reporting pt has not eaten anything today due to appointments being earlier than usual. BP obtained in supine, then in sitting with no issues noted. Once seated pt provided crackers and water during remainder of check in with no further symptoms  reported/noted. Continued to monitor pt with remainder of session.     PT Short Term Goals - 03/08/21 1307       PT SHORT TERM GOAL #1   Title Pt will continue to perform HEP with caregiver assistance, for improved flexibility, strength, transfers.    Baseline reports completing 2-3x/week with caregiver assistance    Time 4    Period Weeks    Status On-going    Target Date 04/05/21      PT SHORT TERM GOAL #2   Title Pt will perform sit to stand transfer with min/mod assist from mat table with RW.    Baseline needs mod/max A (depending on the day)    Time 4    Period Weeks    Status Revised      PT SHORT TERM GOAL #3   Title Pt  will perform stand pivot transfers with RW from w/c <> mat table with mod A x1 in order to demo improved functional transfers.    Baseline --    Time 4    Period Weeks    Status New      PT SHORT TERM GOAL #4   Title Pt will ambulate at least 20' with RW with min-mod A +2 in order to demo  improved functional mobility.    Baseline 5' of mod A +2    Time 4    Period Weeks    Status New               PT Long Term Goals - 03/04/21 1434       PT LONG TERM GOAL #1   Title Pt will be able to perform progression of/finalized HEP and perform standing consistently at home with caregiver assist,  TARGET 05/03/21    Baseline pt's son reports working on exercises at home with caregiver - pt will benefit from ongoing revisions/additions, pt not performing standing at home with caregivers.    Time 8    Period Weeks    Status Revised    Target Date 05/03/21      PT LONG TERM GOAL #2   Title Pt will be able to perform all edge of bed scooting with min A for improved ease of transfers, decreased caregiver burden    Baseline 03/04/2021: pt requires cues and min assist to laterally scoot to L, and mod assist and cues to laterally scoot to R    Time 8    Period Weeks    Status On-going      PT LONG TERM GOAL #3   Title Caregiver will report standing at sink or RW at home, for at least 5 minutes, at least 5 days per week, with min guard>supervision, for improved standing tolerance for ADL participation.    Baseline 03/04/2021: pt's son reports not much standing at home    Time 8    Period Weeks    Status On-going      PT LONG TERM GOAL #4   Title Pt will be able to perform wheelchair transfer standpivot with min A for improved independence vs. sliding board transfer with min guard, in order to decrease caregiver burden    Baseline pt needing mod A to perform transfer    Time 8    Period Weeks    Status On-going      PT LONG TERM GOAL #5   Title Pt will ambulate at least 30' with RW with  min A +2 in order to demo improved functional mobility.    Baseline 5' of mod A +2    Time 8    Period Weeks    Status New      Additional Long Term Goals   Additional Long Term Goals Yes      PT LONG TERM GOAL #6   Title Pt will perform sit <> stands with RW and min A in order to demo improved functional transfers to decr caregiver burden.    Time 8    Period Weeks    Status New                   Plan - 03/10/21 1654     Clinical Impression Statement Today's skilled session was intended to focus on exercises to improve weight shifting and work on gait training. However, after c/o dizziness during session, pt reported that he hadn't eat before session. Pts orthostatic BP was checked and spent portion of session eating snack to help blood sugar. The pt could continue to benefit from further skilled PT to address functional deficits.    Personal Factors and Comorbidities Age;Time since onset of injury/illness/exacerbation    Examination-Activity Limitations Locomotion Level;Transfers;Sit;Stand;Toileting;Dressing;Hygiene/Grooming;Self Feeding;Bed Mobility;Bend;Bathing    Examination-Participation Restrictions Community Activity;Shop;Meal Prep;Yard Work    Conservation officer, historic buildings Evolving/Moderate complexity  Rehab Potential Fair    PT Frequency 2x / week    PT Duration 8 weeks    PT Treatment/Interventions ADLs/Self Care Home Management;Electrical Stimulation;DME Instruction;Gait training;Functional mobility training;Therapeutic activities;Therapeutic exercise;Balance training;Neuromuscular re-education;Manual techniques;Patient/family education;Orthotic Fit/Training;Wheelchair mobility training;Passive range of motion;Dry needling;Taping    PT Next Visit Plan Try standing in parallel bars or at sink to increase standing time and UE support for standing balance activities. continue to work on LE strengthening, stand pivot transfers with RW, try sliding board transfers,  standing balance with RW, pre gait activities. LE stretching to assist with tone/spasticity management priort to any standing or transfers. continue to work on gait with 2 person assit/Bluerocker AFO.    PT Home Exercise Plan Forward lean x20 with caregiver; standing 3x daily for at least 30 sec with caregiver; [employer] 12/29/20    Consulted and Agree with Plan of Care Patient;Family member/caregiver    Family Member Consulted [place] present during part of session.             Patient will benefit from skilled therapeutic intervention in order to improve the following deficits and impairments:  Abnormal gait, Difficulty walking, Impaired tone, Decreased range of motion, Decreased coordination, Decreased endurance, Impaired UE functional use, Decreased activity tolerance, Pain, Decreased balance, Decreased mobility, Decreased strength, Impaired sensation, Postural dysfunction  Visit Diagnosis: Unsteadiness on feet  Hemiplegia and hemiparesis following cerebral infarction affecting right dominant side (HCC)  Muscle weakness (generalized)     Problem List Patient Active Problem List   Diagnosis Date Noted   Cerebrovascular accident (CVA) (HCC)    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   Hypertensive urgency 03/12/2020   Acute on chronic systolic CHF (congestive heart failure) (HCC) 03/12/2020   AKI (acute kidney injury) (HCC) 03/12/2020   Fall at home, initial encounter 03/12/2020   Right hip pain 03/12/2020   Hypokalemia 03/12/2020   Coronary artery disease involving native coronary artery of native heart without angina pectoris 09/07/2014   Renal insufficiency 12/02/2012   Preventative health care 11/13/2010   Secondary cardiomyopathy (HCC) 01/24/2010   TACHYCARDIA 01/24/2010   Type 2 diabetes mellitus without complication, with long-term current use of insulin (HCC) 11/20/2008   Mixed hyperlipidemia 11/12/2007   Essential hypertension 11/12/2007    CONGESTIVE HEART FAILURE 11/12/2007    Ruben Gottron, SPTA 03/10/2021, 5:15 PM  [facility] [address] [place], [place], [address] Phone: [phone]   Fax:  [phone]  Name: AYEDEN MINCHEW MRN: [id number] Date of Birth: [birth year]  This note has been reviewed and edited by supervising CI.   Sallyanne Kuster, PTA, [facility] [address], [address] [place], [place] [address] [phone] 03/10/21, 10:16 PM

## 2021-03-15 ENCOUNTER — Encounter: Payer: Self-pay | Admitting: Occupational Therapy

## 2021-03-15 ENCOUNTER — Other Ambulatory Visit: Payer: Self-pay

## 2021-03-15 ENCOUNTER — Ambulatory Visit: Payer: No Typology Code available for payment source | Admitting: Physical Therapy

## 2021-03-15 ENCOUNTER — Ambulatory Visit: Payer: No Typology Code available for payment source | Admitting: Occupational Therapy

## 2021-03-15 ENCOUNTER — Encounter: Payer: Self-pay | Admitting: Physical Therapy

## 2021-03-15 DIAGNOSIS — G463 Brain stem stroke syndrome: Secondary | ICD-10-CM | POA: Diagnosis not present

## 2021-03-15 DIAGNOSIS — R41840 Attention and concentration deficit: Secondary | ICD-10-CM

## 2021-03-15 DIAGNOSIS — M25621 Stiffness of right elbow, not elsewhere classified: Secondary | ICD-10-CM

## 2021-03-15 DIAGNOSIS — R293 Abnormal posture: Secondary | ICD-10-CM

## 2021-03-15 DIAGNOSIS — R2681 Unsteadiness on feet: Secondary | ICD-10-CM

## 2021-03-15 DIAGNOSIS — M6281 Muscle weakness (generalized): Secondary | ICD-10-CM

## 2021-03-15 DIAGNOSIS — R278 Other lack of coordination: Secondary | ICD-10-CM

## 2021-03-15 DIAGNOSIS — I69351 Hemiplegia and hemiparesis following cerebral infarction affecting right dominant side: Secondary | ICD-10-CM

## 2021-03-15 DIAGNOSIS — M25611 Stiffness of right shoulder, not elsewhere classified: Secondary | ICD-10-CM

## 2021-03-15 NOTE — Therapy (Signed)
[facility] [address] [place], [place], [address] Phone: [phone]   Fax:  [phone]  Physical Therapy Treatment  Patient Details  Name: Trevor Anderson MRN: [id number] Date of Birth: 08-Feb-1956 Referring Provider (PT): Noel Gerold   Encounter Date: 03/15/2021   PT End of Session - 03/15/21 1649     Visit Number 23    Number of Visits 30    Date for PT Re-Evaluation 05/03/21   per recert on 03/04/21   Authorization Type VA auth# [id number] (15 visits); VA has approved additional 15 PT visits to 05/26/2021    Authorization - Visit Number 8    Authorization - Number of Visits 15    PT Start Time 1445    PT Stop Time 1527    PT Time Calculation (min) 42 min    Equipment Utilized During Treatment Gait belt    Activity Tolerance Patient tolerated treatment well;Patient limited by fatigue    Behavior During Therapy [facility] for tasks assessed/performed             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   CONGESTIVE HEART FAILURE 11/12/2007   DIABETES MELLITUS, TYPE II 11/20/2008   HYPERLIPIDEMIA 11/12/2007   HYPERTENSION 11/12/2007   TACHYCARDIA 01/24/2010    Past Surgical History:  Procedure Laterality Date   BUBBLE STUDY  03/16/2020   Procedure: BUBBLE STUDY;  Surgeon: Jodelle Red, MD;  Location: [facility] ENDOSCOPY;  Service: Cardiovascular;;   LOOP RECORDER INSERTION N/A 03/23/2020   Procedure: LOOP RECORDER INSERTION;  Surgeon: Hillis Range, MD;  Location: [facility] INVASIVE CV LAB;  Service: Cardiovascular;  Laterality: N/A;   TEE WITHOUT CARDIOVERSION N/A 03/16/2020   Procedure: TRANSESOPHAGEAL ECHOCARDIOGRAM (TEE);  Surgeon: Jodelle Red, MD;  Location: [facility] ENDOSCOPY;  Service: Cardiovascular;  Laterality: N/A;   TONSILLECTOMY  1970    There were no vitals filed for this visit.   Subjective Assessment - 03/15/21 1450     Subjective No new complaints.  No falls. C/o a little pain in R shoulder today, but "not bad."    Patient is accompained by: --   Cargiver in lobby   Limitations Standing    How long can you stand comfortably? 5-10 minutes    Patient Stated Goals Open/close hand; Get more R LE motion;    Currently in Pain? Yes    Pain Score 1     Pain Location Shoulder    Pain Orientation Right    Pain Descriptors / Indicators Aching    Pain Type Acute pain    Pain Onset In the past 7 days    Pain Frequency Intermittent    Multiple Pain Sites No    Pain Onset Today               [facility] Adult PT Treatment/Exercise - 03/15/21 1452       Bed Mobility   Bed Mobility Sit to Supine   Other: pt required cues and mod assist to scoot to<>from edge of mat.   Sit to Supine Minimal Assistance - Patient > 75%      Transfers   Transfers Sit to Stand;Stand to Sit    Sit to Stand 2: Max assist;From chair/3-in-1   max assist +2   Sit to Stand Details Tactile cues for weight shifting;Tactile cues for placement;Verbal cues for technique;Verbal cues for sequencing    Sit to Stand Details (indicate cue type and reason) Pt required cues for anterior weight shifting  and hip extension in standing. The pt also needed manual assist for placement of RUE on RW hand grip and proper placement of RLE.    Stand to Sit 3: Mod assist    Stand to Sit Details (indicate cue type and reason) Verbal cues for sequencing;Verbal cues for technique    Stand Pivot Transfers 2: Max assist;With armrests    Stand Pivot Transfer Details (indicate cue type and reason) max assist +2 for stand pivot from WC>mat table. Pt required manual assistance for moving R LE to complete pivot.  Facilitation for posture, hip extension and weight shifitng.      Exercises   Exercises Knee/Hip      Knee/Hip Exercises: Stretches   Passive Hamstring Stretch Both;3 reps;Other (comment)    Passive Hamstring Stretch Limitations Performed passive hamstring/heel cord stretch on RLE 3 x 30 sec.     Hip Flexor Stretch Right;3 reps;30 seconds    Hip Flexor Stretch Limitations Performed passive modified Thomas stretch on R 3 x 30 sec.    Gastroc Stretch --    Other Knee/Hip Stretches Passive hip adductor stretch 3 x 30 sec. The pt's R hip tended to externally rotate when performing, requiring manual assistance to attain neutral while abducting.      Knee/Hip Exercises: Supine   Quad Sets PROM;Other (comment)    Quad Sets Limitations Attempted to have pt perform quad sets on R, however, no distal quad activation was felt. Switched to short arc quads to attempt eccentric control    Short Arc [employer] PROM;AAROM;1 set;10 reps    Short Arc Quad Sets Limitations Attempted to have pt perform short arc quads on right, but therapist felt no distal quad activation. The therapist placed the pt's knee in extension with pillow under R knee, and had pt focus on eccentric control of descent with manual facilitation of R quads. Performed 10 x    Bridges AROM;Both;1 set;10 reps    Bridges Limitations Pt performed bridges with B LE's x 10.    Other Supine Knee/Hip Exercises Alternating knee fallouts from hooklying. Pt required cues and assistance to complete ROM with R LE.               PT Short Term Goals - 03/08/21 1307       PT SHORT TERM GOAL #1   Title Pt will continue to perform HEP with caregiver assistance, for improved flexibility, strength, transfers.    Baseline reports completing 2-3x/week with caregiver assistance    Time 4    Period Weeks    Status On-going    Target Date 04/05/21      PT SHORT TERM GOAL #2   Title Pt will perform sit to stand transfer with min/mod assist from mat table with RW.    Baseline needs mod/max A (depending on the day)    Time 4    Period Weeks    Status Revised      PT SHORT TERM GOAL #3   Title Pt will perform stand pivot transfers with RW from w/c <> mat table with mod A x1 in order to demo improved functional transfers.    Baseline --     Time 4    Period Weeks    Status New      PT SHORT TERM GOAL #4   Title Pt will ambulate at least 20' with RW with min-mod A +2 in order to demo improved functional mobility.    Baseline 5' of mod A +2  Time 4    Period Weeks    Status New               PT Long Term Goals - 03/04/21 1434       PT LONG TERM GOAL #1   Title Pt will be able to perform progression of/finalized HEP and perform standing consistently at home with caregiver assist,  TARGET 05/03/21    Baseline pt's son reports working on exercises at home with caregiver - pt will benefit from ongoing revisions/additions, pt not performing standing at home with caregivers.    Time 8    Period Weeks    Status Revised    Target Date 05/03/21      PT LONG TERM GOAL #2   Title Pt will be able to perform all edge of bed scooting with min A for improved ease of transfers, decreased caregiver burden    Baseline 03/04/2021: pt requires cues and min assist to laterally scoot to L, and mod assist and cues to laterally scoot to R    Time 8    Period Weeks    Status On-going      PT LONG TERM GOAL #3   Title Caregiver will report standing at sink or RW at home, for at least 5 minutes, at least 5 days per week, with min guard>supervision, for improved standing tolerance for ADL participation.    Baseline 03/04/2021: pt's son reports not much standing at home    Time 8    Period Weeks    Status On-going      PT LONG TERM GOAL #4   Title Pt will be able to perform wheelchair transfer standpivot with min A for improved independence vs. sliding board transfer with min guard, in order to decrease caregiver burden    Baseline pt needing mod A to perform transfer    Time 8    Period Weeks    Status On-going      PT LONG TERM GOAL #5   Title Pt will ambulate at least 30' with RW with min A +2 in order to demo improved functional mobility.    Baseline 5' of mod A +2    Time 8    Period Weeks    Status New      Additional  Long Term Goals   Additional Long Term Goals Yes      PT LONG TERM GOAL #6   Title Pt will perform sit <> stands with RW and min A in order to demo improved functional transfers to decr caregiver burden.    Time 8    Period Weeks    Status New                   Plan - 03/15/21 1650     Clinical Impression Statement Today's skilled session was focused on stretching/strengthening of R LE on the mat. The pt tolerated treatment well, with a few episodes of tone kicking in on the R LE. The pt could continue to benefit from further skilled PT to address functional deficits.    Personal Factors and Comorbidities Age;Time since onset of injury/illness/exacerbation    Examination-Activity Limitations Locomotion Level;Transfers;Sit;Stand;Toileting;Dressing;Hygiene/Grooming;Self Feeding;Bed Mobility;Bend;Bathing    Examination-Participation Restrictions Community Activity;Shop;Meal Prep;Yard Work    Conservation officer, historic buildings Evolving/Moderate complexity    Rehab Potential Fair    PT Frequency 2x / week    PT Duration 8 weeks    PT Treatment/Interventions ADLs/Self Care Home Management;Electrical Stimulation;DME Instruction;Gait training;Functional  mobility training;Therapeutic activities;Therapeutic exercise;Balance training;Neuromuscular re-education;Manual techniques;Patient/family education;Orthotic Fit/Training;Wheelchair mobility training;Passive range of motion;Dry needling;Taping    PT Next Visit Plan Try standing in parallel bars or at sink to increase standing time and UE support for standing balance activities. continue to work on LE strengthening, stand pivot transfers with RW, try sliding board transfers, standing balance with RW, pre gait activities. LE stretching to assist with tone/spasticity management prior to any standing or transfers. continue to work on gait with 2 person assit/Bluerocker AFO.    PT Home Exercise Plan Forward lean x20 with caregiver; standing 3x  daily for at least 30 sec with caregiver; MedBridge 12/29/20    Consulted and Agree with Plan of Care Patient             Patient will benefit from skilled therapeutic intervention in order to improve the following deficits and impairments:  Abnormal gait, Difficulty walking, Impaired tone, Decreased range of motion, Decreased coordination, Decreased endurance, Impaired UE functional use, Decreased activity tolerance, Pain, Decreased balance, Decreased mobility, Decreased strength, Impaired sensation, Postural dysfunction  Visit Diagnosis: Hemiplegia and hemiparesis following cerebral infarction affecting right dominant side (HCC)  Muscle weakness (generalized)  Unsteadiness on feet  Abnormal posture     Problem List Patient Active Problem List   Diagnosis Date Noted   Cerebrovascular accident (CVA) (HCC)    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   Hypertensive urgency 03/12/2020   Acute on chronic systolic CHF (congestive heart failure) (HCC) 03/12/2020   AKI (acute kidney injury) (HCC) 03/12/2020   Fall at home, initial encounter 03/12/2020   Right hip pain 03/12/2020   Hypokalemia 03/12/2020   Coronary artery disease involving native coronary artery of native heart without angina pectoris 09/07/2014   Renal insufficiency 12/02/2012   Preventative health care 11/13/2010   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 [place], [place], [address] Phone: [phone]   Fax:  [phone]  Name: Trevor Anderson MRN: [id number] Date of Birth: [birth year]  This note has been reviewed and edited  by supervising CI.   Sallyanne Kuster, PTA, [facility] [address] [place], [place] [address] [phone] 03/15/21, 8:09 PM

## 2021-03-15 NOTE — Therapy (Signed)
[facility] [address] [place], [place], [address] Phone: [phone]   Fax:  [phone]  Occupational Therapy Treatment  Patient Details  Name: Trevor Anderson MRN: [id number] Date of Birth: July 22, 1955 Referring Provider (OT): Deland Pretty, NP   Encounter Date: 03/15/2021   OT End of Session - 03/15/21 1658     Visit Number 16    Number of Visits 15   15 treatments + 1 eval   Date for OT Re-Evaluation 05/31/21    Authorization Type VA    Authorization Time Period 15 visits approved 01/12/21 - 04/27/21    Authorization - Visit Number 15    Authorization - Number of Visits 15    Progress Note Due on Visit 20    OT Start Time 7062    OT Stop Time 1615    OT Time Calculation (min) 45 min    Activity Tolerance Patient tolerated treatment well    Behavior During Therapy [facility] for tasks assessed/performed             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   CONGESTIVE HEART FAILURE 11/12/2007   DIABETES MELLITUS, TYPE II 11/20/2008   HYPERLIPIDEMIA 11/12/2007   HYPERTENSION 11/12/2007   TACHYCARDIA 01/24/2010    Past Surgical History:  Procedure Laterality Date   BUBBLE STUDY  03/16/2020   Procedure: BUBBLE STUDY;  Surgeon: Buford Dresser, MD;  Location: [facility];  Service: Cardiovascular;;   LOOP RECORDER INSERTION N/A 03/23/2020   Procedure: LOOP RECORDER INSERTION;  Surgeon: Thompson Grayer, MD;  Location: [facility] CV LAB;  Service: Cardiovascular;  Laterality: N/A;   TEE WITHOUT CARDIOVERSION N/A 03/16/2020   Procedure: TRANSESOPHAGEAL ECHOCARDIOGRAM (TEE);  Surgeon: Buford Dresser, MD;  Location: [facility] ENDOSCOPY;  Service: Cardiovascular;  Laterality: N/A;   TONSILLECTOMY  1970    There were no vitals filed for this visit.   Subjective Assessment - 03/15/21 1651     Subjective  Patient reports pain / stiffness in right hip    Pertinent History  HLD, HTN, DM, CHF    Limitations Fall Risk. Max/Total stand pivot transfer w RW. Loop Recorder (no ESTIM)    Currently in Pain? No/denies    Pain Score 0-No pain                          OT Treatments/Exercises (OP) - 03/15/21 1652       Neurological Re-education Exercises   Other Exercises 1 Neuromuscular reeducation to address postural control.  Patient sits with strong posterior pelvis and mass behind base of support.  Patient has right hip spasm with attempt to weight shift toward right LE.  Patient resists weight shift forward and right then leads to hip discomfort.  With increased weight shift thru lower trunk/ lower extremities - less hip thigh pain.  Worked on transitional movements initially on physioball to reduce LE tightness. Then worked to improve sit to squat and squat pivot transfer - emphasizing upright trunk and forward weight shift at hips.  Able to scoot toward left with min assist for weight shift forward.                      OT Short Term Goals - 03/15/21 1700       OT SHORT TERM GOAL #1   Title Pt and caregivers will be independent with HEP    Time 4    Period  Weeks    Status Achieved    Target Date 02/09/21      OT SHORT TERM GOAL #2   Title Pt will complete UB dressing with min A for pull over short sleeve shirt.    Baseline mod A    Time 4    Period Weeks    Status Achieved   per pt report - completing with min A for hemi arm.     OT SHORT TERM GOAL #3   Title Pt and caregiver will verbalize understanding of adapted strategies and/or equipment for completing toilet and tub transfers with mod A consistently.    Baseline currently mod/max and not getting to shower.    Time 4    Period Weeks    Status Not Met   max A for transfers and unable to fully get to tub bench and shower safely at this time and not getting on [facility] consistently at home. still using bed pan. 03/08/21     OT SHORT TERM GOAL #4   Title Pt will achieve 90* of  PROM for RUE shoulder flexion to reduce risk of malpositioning and increased pain.    Time 4    Period Weeks    Status Achieved   90* with RUE shoulder flexion 03/08/21     OT SHORT TERM GOAL #5   Title Pt and caregivers will verbalize and demonstrate understanding of wear and care of any splints and/or orthoses PRN.    Time 4    Period Weeks    Status Achieved               OT Long Term Goals - 03/15/21 1700       OT LONG TERM GOAL #1   Title Pt and caregivers will be independent with and updated HEPs    Time 10    Period Weeks    Status On-going      OT LONG TERM GOAL #2   Title Pt will complete toilet and tub transfers consistently with min assistance with adapted strategies and equipment PRN    Baseline mod A for stand pivot    Time 10    Period Weeks    Status Deferred   Pt consistently mod - max transfer. 03/08/21     OT LONG TERM GOAL #3   Title Pt will complete bathing with min A    Baseline mod-max    Time 10    Period Weeks    Status On-going   simulated min A in the clinic 03/08/21 - pt able to reach 75% with washcloth while seated in tilt in space chair.     OT LONG TERM GOAL #4   Title Pt will demonstrate composite flexion/extension of 15% or greater in RUE for preparing for active grasp/release and increasing functional use.    Baseline trace extension    Time 10    Period Weeks    Status On-going   trace extension/approx 10% flexion 03/08/21     OT LONG TERM GOAL #5   Title Pt will achieve -20 degrees of elbow extension in RUE of PROM or decreasing risk of skin breakdown    Baseline -40*    Time 10    Period Weeks    Status On-going   -25* 03/08/21     OT LONG TERM GOAL #6   Title Pt and son will verbalize understanding of home modifications/equipment and/or adapted strategies for increasing independence with transfers for bathing and toileting and increasing  safety and independence with ADLs/IADLs. (BSC, sponge baths vs tub bench, long handled  sponge, cutting food, etc)    Time 12    Period Weeks    Status New                   Plan - 03/15/21 1659     Clinical Impression Statement Awaiting authorization for additional OT visits    OT Occupational Profile and History Problem Focused Assessment - Including review of records relating to presenting problem    Occupational performance deficits (Please refer to evaluation for details): IADL's;ADL's;Leisure    Body Structure / Function / Physical Skills Tone;Strength;ADL;FMC;Mobility;GMC;ROM;IADL;UE functional use;Decreased knowledge of use of DME    Cognitive Skills Problem Solve    Rehab Potential Good    Clinical Decision Making Limited treatment options, no task modification necessary    Comorbidities Affecting Occupational Performance: None    Modification or Assistance to Complete Evaluation  No modification of tasks or assist necessary to complete eval    OT Frequency 2x / week    OT Duration 12 weeks   16 visits after 11/15 pending VA auth/approval   OT Treatment/Interventions Self-care/ADL training;Manual Therapy;Patient/family education;Electrical Stimulation;Neuromuscular education;Functional Mobility Training;Passive range of motion;Cognitive remediation/compensation;Therapeutic exercise;Moist Heat;DME and/or AE instruction;Therapeutic activities;Aquatic Therapy;Splinting    Plan did recertifiction 59/56 ... submitted for 15 more visits from [place] 11/2 - awaiting approval. Needs to go on hold after visit 15 until receive approval.    OT Home Exercise Plan self PROM    Consulted and Agree with Plan of Care Patient;Family member/caregiver    Family Member Consulted son, Bryndon             Patient will benefit from skilled therapeutic intervention in order to improve the following deficits and impairments:   Body Structure / Function / Physical Skills: Tone, Strength, ADL, FMC, Mobility, GMC, ROM, IADL, UE functional use, Decreased knowledge of use of  DME Cognitive Skills: Problem Solve     Visit Diagnosis: Hemiplegia and hemiparesis following cerebral infarction affecting right dominant side (HCC)  Muscle weakness (generalized)  Stiffness of right shoulder, not elsewhere classified  Stiffness of right elbow, not elsewhere classified  Other lack of coordination  Attention and concentration deficit  Unsteadiness on feet  Abnormal posture    Problem List Patient Active Problem List   Diagnosis Date Noted   Cerebrovascular accident (CVA) ([place])    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   Hypertensive urgency 03/12/2020   Acute on chronic systolic CHF (congestive heart failure) ([place]) 03/12/2020   AKI (acute kidney injury) ([place]) 03/12/2020   Fall at home, initial encounter 03/12/2020   Right hip pain 03/12/2020   Hypokalemia 03/12/2020   Coronary artery disease involving native coronary artery of native heart without angina pectoris 09/07/2014   Renal insufficiency 12/02/2012   Preventative health care 11/13/2010   Secondary cardiomyopathy ([place]) 01/24/2010   TACHYCARDIA 01/24/2010   Type 2 diabetes mellitus without complication, with long-term current use of insulin ([place]) 11/20/2008   Mixed hyperlipidemia 11/12/2007   Essential hypertension 11/12/2007   CONGESTIVE HEART FAILURE 11/12/2007    Mariah Milling, OT/L 03/15/2021, 5:01 PM  [facility] [address] [place], [place], [address] Phone: [phone]   Fax:  [phone]  Name: AVISHAI REIHL MRN: [id number] Date of Birth: Aug 24, 1955

## 2021-03-21 ENCOUNTER — Ambulatory Visit: Payer: No Typology Code available for payment source | Admitting: Occupational Therapy

## 2021-03-21 ENCOUNTER — Encounter: Payer: Self-pay | Admitting: Physical Therapy

## 2021-03-21 ENCOUNTER — Ambulatory Visit: Payer: No Typology Code available for payment source | Admitting: Physical Therapy

## 2021-03-21 ENCOUNTER — Other Ambulatory Visit: Payer: Self-pay

## 2021-03-21 DIAGNOSIS — M25611 Stiffness of right shoulder, not elsewhere classified: Secondary | ICD-10-CM

## 2021-03-21 DIAGNOSIS — I69351 Hemiplegia and hemiparesis following cerebral infarction affecting right dominant side: Secondary | ICD-10-CM

## 2021-03-21 DIAGNOSIS — R41840 Attention and concentration deficit: Secondary | ICD-10-CM

## 2021-03-21 DIAGNOSIS — M25621 Stiffness of right elbow, not elsewhere classified: Secondary | ICD-10-CM

## 2021-03-21 DIAGNOSIS — R2681 Unsteadiness on feet: Secondary | ICD-10-CM

## 2021-03-21 DIAGNOSIS — G463 Brain stem stroke syndrome: Secondary | ICD-10-CM | POA: Diagnosis not present

## 2021-03-21 DIAGNOSIS — M6281 Muscle weakness (generalized): Secondary | ICD-10-CM

## 2021-03-21 DIAGNOSIS — R293 Abnormal posture: Secondary | ICD-10-CM

## 2021-03-21 DIAGNOSIS — R278 Other lack of coordination: Secondary | ICD-10-CM

## 2021-03-21 NOTE — Therapy (Signed)
[facility] [address] [place], [place], [address] Phone: [phone]   Fax:  [phone]  Physical Therapy Treatment  Patient Details  Name: Trevor Anderson MRN: [id number] Date of Birth: Sep 04, 1955 Referring Provider (PT): Noel Gerold   Encounter Date: 03/21/2021   PT End of Session - 03/21/21 1321     Visit Number 24    Number of Visits 30    Date for PT Re-Evaluation 05/03/21   per recert on 03/04/21   Authorization Type VA auth# [id number] (15 visits); VA has approved additional 15 PT visits to 05/26/2021    Authorization - Visit Number 9    Authorization - Number of Visits 15    PT Start Time 1318    PT Stop Time 1358    PT Time Calculation (min) 40 min    Equipment Utilized During Treatment Gait belt   Blue Rocker AFO   Activity Tolerance Patient tolerated treatment well;Patient limited by fatigue    Behavior During Therapy [facility] for tasks assessed/performed             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   CONGESTIVE HEART FAILURE 11/12/2007   DIABETES MELLITUS, TYPE II 11/20/2008   HYPERLIPIDEMIA 11/12/2007   HYPERTENSION 11/12/2007   TACHYCARDIA 01/24/2010    Past Surgical History:  Procedure Laterality Date   BUBBLE STUDY  03/16/2020   Procedure: BUBBLE STUDY;  Surgeon: Jodelle Red, MD;  Location: [facility] ENDOSCOPY;  Service: Cardiovascular;;   LOOP RECORDER INSERTION N/A 03/23/2020   Procedure: LOOP RECORDER INSERTION;  Surgeon: Hillis Range, MD;  Location: [facility] INVASIVE CV LAB;  Service: Cardiovascular;  Laterality: N/A;   TEE WITHOUT CARDIOVERSION N/A 03/16/2020   Procedure: TRANSESOPHAGEAL ECHOCARDIOGRAM (TEE);  Surgeon: Jodelle Red, MD;  Location: [facility] ENDOSCOPY;  Service: Cardiovascular;  Laterality: N/A;   TONSILLECTOMY  1970    There were no vitals filed for this visit.   Subjective Assessment - 03/21/21 1319     Subjective  No falls. No new complaints. Only trying a little bit of standing at home.    Patient is accompained by: --   Cargiver in lobby   Limitations Standing    How long can you stand comfortably? 5-10 minutes    Patient Stated Goals Open/close hand; Get more R LE motion;    Currently in Pain? No/denies    Pain Onset In the past 7 days    Pain Onset Today                               [facility] Adult PT Treatment/Exercise - 03/21/21 1424       Bed Mobility   Supine to Sit Moderate Assistance - Patient 50-74%   Assist at trunk and to bring BLE off the mat table     Transfers   Transfers Sit to Stand;Stand to Sit    Sit to Stand 3: Mod assist   x2 persons   Sit to Stand Details Tactile cues for weight shifting;Tactile cues for placement;Verbal cues for technique;Verbal cues for sequencing    Sit to Stand Details (indicate cue type and reason) Cues/assist (from w/c) to help scoot out towards edge prior to standing. Verbal/manual cues for incr forward weight shift and therapist providing facilitation through RLE. Performed x3 reps standing from elevated mat table with therapist placing foot between pt's feet to prevent incr ADD when standing and for proper  position of RLE. During 1st rep of standing, therapist assisting with R hand placement on RW due to incr spasticity. Once in standing, cues for looking ahead, tall posture, hip extension and weight shifting to R.    Stand to Sit 3: Mod assist    Stand to Sit Details (indicate cue type and reason) Verbal cues for sequencing;Verbal cues for technique    Stand to Sit Details Cues to reach back for slowed descent, therapist helping to tip RW as pt still holding on with R hand when going to sit down.    Comments Prior to standing, performed x10 reps anterior lean and then to upright posture for incr forward lean for sit <> stands      Ambulation/Gait   Ambulation/Gait Yes    Ambulation/Gait Assistance 3: Mod assist   of 2 therapists  with close w/c follow   Ambulation/Gait Assistance Details Continued with use of blue rocker AFO, performed with +2 therapists. With cues/facilitation for upright posture, standing tall on stance leg for hip/quad extension, and weight shifting. Therapist having to help steady RW at times. PT providing assist for RLE foot clearance and occassional R foot placement for wider BOS. Needed seated rest break between each bout.    Ambulation Distance (Feet) 13 Feet   x1, 10' x 1   Assistive device Rolling walker;Other (Comment)   R AFO   Gait Pattern Step-to pattern;Decreased stride length;Decreased step length - right;Decreased step length - left;Decreased stance time - right;Decreased hip/knee flexion - right;Decreased dorsiflexion - right;Decreased weight shift to right;Right flexed knee in stance;Decreased trunk rotation;Narrow base of support;Poor foot clearance - right    Ambulation Surface Level;Indoor      Exercises   Exercises Knee/Hip    Other Exercises  Prior to standing, seated at edge of mat, hamstring/calf stretch 4 x 30 seconds with cues for posture, x10 reps AAROM/PROM into LAQs x10 reps                       PT Short Term Goals - 03/08/21 1307       PT SHORT TERM GOAL #1   Title Pt will continue to perform HEP with caregiver assistance, for improved flexibility, strength, transfers.    Baseline reports completing 2-3x/week with caregiver assistance    Time 4    Period Weeks    Status On-going    Target Date 04/05/21      PT SHORT TERM GOAL #2   Title Pt will perform sit to stand transfer with min/mod assist from mat table with RW.    Baseline needs mod/max A (depending on the day)    Time 4    Period Weeks    Status Revised      PT SHORT TERM GOAL #3   Title Pt will perform stand pivot transfers with RW from w/c <> mat table with mod A x1 in order to demo improved functional transfers.    Baseline --    Time 4    Period Weeks    Status New      PT SHORT  TERM GOAL #4   Title Pt will ambulate at least 20' with RW with min-mod A +2 in order to demo improved functional mobility.    Baseline 5' of mod A +2    Time 4    Period Weeks    Status New               PT Long Term  Goals - 03/04/21 1434       PT LONG TERM GOAL #1   Title Pt will be able to perform progression of/finalized HEP and perform standing consistently at home with caregiver assist,  TARGET 05/03/21    Baseline pt's son reports working on exercises at home with caregiver - pt will benefit from ongoing revisions/additions, pt not performing standing at home with caregivers.    Time 8    Period Weeks    Status Revised    Target Date 05/03/21      PT LONG TERM GOAL #2   Title Pt will be able to perform all edge of bed scooting with min A for improved ease of transfers, decreased caregiver burden    Baseline 03/04/2021: pt requires cues and min assist to laterally scoot to L, and mod assist and cues to laterally scoot to R    Time 8    Period Weeks    Status On-going      PT LONG TERM GOAL #3   Title Caregiver will report standing at sink or RW at home, for at least 5 minutes, at least 5 days per week, with min guard>supervision, for improved standing tolerance for ADL participation.    Baseline 03/04/2021: pt's son reports not much standing at home    Time 8    Period Weeks    Status On-going      PT LONG TERM GOAL #4   Title Pt will be able to perform wheelchair transfer standpivot with min A for improved independence vs. sliding board transfer with min guard, in order to decrease caregiver burden    Baseline pt needing mod A to perform transfer    Time 8    Period Weeks    Status On-going      PT LONG TERM GOAL #5   Title Pt will ambulate at least 30' with RW with min A +2 in order to demo improved functional mobility.    Baseline 5' of mod A +2    Time 8    Period Weeks    Status New      Additional Long Term Goals   Additional Long Term Goals Yes       PT LONG TERM GOAL #6   Title Pt will perform sit <> stands with RW and min A in order to demo improved functional transfers to decr caregiver burden.    Time 8    Period Weeks    Status New                   Plan - 03/21/21 1433     Clinical Impression Statement Today's skilled session focused on standing tolerance/sit <> stands and gait training with use of R blue rocker AFO and +2 mod assist. Pt needing manual and verbal cues for tall posture and hip extension during gait, pt able to demo taking incr step length with LLE today with incr RLE weight shift. Will continue to progress towards LTGs.    Personal Factors and Comorbidities Age;Time since onset of injury/illness/exacerbation    Examination-Activity Limitations Locomotion Level;Transfers;Sit;Stand;Toileting;Dressing;Hygiene/Grooming;Self Feeding;Bed Mobility;Bend;Bathing    Examination-Participation Restrictions Community Activity;Shop;Meal Prep;Yard Work    Conservation officer, historic buildings Evolving/Moderate complexity    Rehab Potential Fair    PT Frequency 2x / week    PT Duration 8 weeks    PT Treatment/Interventions ADLs/Self Care Home Management;Electrical Stimulation;DME Instruction;Gait training;Functional mobility training;Therapeutic activities;Therapeutic exercise;Balance training;Neuromuscular re-education;Manual techniques;Patient/family education;Orthotic Fit/Training;Wheelchair mobility training;Passive range of motion;Dry  needling;Taping    PT Next Visit Plan Try standing in parallel bars or at sink to increase standing time and UE support for standing balance activities. continue to work on LE strengthening, stand pivot transfers with RW, try sliding board transfers, standing balance with RW, pre gait activities. LE stretching to assist with tone/spasticity management prior to any standing or transfers. continue to work on gait with 2 person assit/Bluerocker AFO.    PT Home Exercise Plan Forward lean x20 with  caregiver; standing 3x daily for at least 30 sec with caregiver; [employer] 12/29/20    Consulted and Agree with Plan of Care Patient             Patient will benefit from skilled therapeutic intervention in order to improve the following deficits and impairments:  Abnormal gait, Difficulty walking, Impaired tone, Decreased range of motion, Decreased coordination, Decreased endurance, Impaired UE functional use, Decreased activity tolerance, Pain, Decreased balance, Decreased mobility, Decreased strength, Impaired sensation, Postural dysfunction  Visit Diagnosis: Hemiplegia and hemiparesis following cerebral infarction affecting right dominant side (HCC)  Muscle weakness (generalized)  Unsteadiness on feet  Abnormal posture     Problem List Patient Active Problem List   Diagnosis Date Noted   Cerebrovascular accident (CVA) (HCC)    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   Hypertensive urgency 03/12/2020   Acute on chronic systolic CHF (congestive heart failure) (HCC) 03/12/2020   AKI (acute kidney injury) (HCC) 03/12/2020   Fall at home, initial encounter 03/12/2020   Right hip pain 03/12/2020   Hypokalemia 03/12/2020   Coronary artery disease involving native coronary artery of native heart without angina pectoris 09/07/2014   Renal insufficiency 12/02/2012   Preventative health care 11/13/2010   Secondary cardiomyopathy (HCC) 01/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 [place], [place], [address] Phone: [phone]   Fax:  [phone]  Name: DEMECO YAGI MRN: [id number] Date of Birth: 08-Jun-1955

## 2021-03-21 NOTE — Therapy (Signed)
[facility] [address] [place], [place], [address] Phone: [phone]   Fax:  [phone]  Occupational Therapy Treatment  Patient Details  Name: Trevor Anderson MRN: [id number] Date of Birth: 1955-04-28 Referring Provider (OT): Deland Pretty, NP   Encounter Date: 03/21/2021   OT End of Session - 03/21/21 1245     Visit Number 17    Number of Visits 30   15 treatments + 1 eval   Date for OT Re-Evaluation 05/31/21    Authorization Type VA    Authorization Time Period 15 visits approved 01/12/21 - 04/27/21    Authorization - Visit Number 16    Authorization - Number of Visits 30    Progress Note Due on Visit 20    OT Start Time 9292   did not have auth until this time   OT Stop Time 1315    OT Time Calculation (min) 34 min    Activity Tolerance Patient tolerated treatment well    Behavior During Therapy [facility] for tasks assessed/performed             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   CONGESTIVE HEART FAILURE 11/12/2007   DIABETES MELLITUS, TYPE II 11/20/2008   HYPERLIPIDEMIA 11/12/2007   HYPERTENSION 11/12/2007   TACHYCARDIA 01/24/2010    Past Surgical History:  Procedure Laterality Date   BUBBLE STUDY  03/16/2020   Procedure: BUBBLE STUDY;  Surgeon: Buford Dresser, MD;  Location: [place];  Service: Cardiovascular;;   LOOP RECORDER INSERTION N/A 03/23/2020   Procedure: LOOP RECORDER INSERTION;  Surgeon: Thompson Grayer, MD;  Location: [place] CV LAB;  Service: Cardiovascular;  Laterality: N/A;   TEE WITHOUT CARDIOVERSION N/A 03/16/2020   Procedure: TRANSESOPHAGEAL ECHOCARDIOGRAM (TEE);  Surgeon: Buford Dresser, MD;  Location: [facility] ENDOSCOPY;  Service: Cardiovascular;  Laterality: N/A;   TONSILLECTOMY  1970    There were no vitals filed for this visit.   Subjective Assessment - 03/21/21 1244     Subjective  VA representative verbally  authorized seeing patient today - following up with sending fax.    Pertinent History HLD, HTN, DM, CHF    Limitations Fall Risk. Max/Total stand pivot transfer w RW. Loop Recorder (no ESTIM)    Currently in Pain? No/denies    Pain Score 0-No pain                          OT Treatments/Exercises (OP) - 03/21/21 1412       Transfers   Sit to Stand 2: Max assist;Other (comment)   w SB from tilt in space > edge of mat   Comments pt with difficulty with sliding to the left side and would frequently lean to right in tilt in space  chair. Pt with increased difficulty getting forward in tilt in space chair, even in upright position, however once up was able to assist some with transfer      ADLs   Functional Mobility worked on lifting buttocks off of mat (elevated) with mod A for increasing ability to stand and for increasing scooting and transfers. Pt was able to lift bottom off of mat approx 1-2 inches x 5 and scoot with minimal scoots to left on mat.      Neurological Re-education Exercises   Other Exercises 1 Seated edge of mat patient demonstrated increased range of motion and mobility in R hip today. Pt worked on reaching to floor  and AAROM for forward reaching with max A.                      OT Short Term Goals - 03/15/21 1700       OT SHORT TERM GOAL #1   Title Pt and caregivers will be independent with HEP    Time 4    Period Weeks    Status Achieved    Target Date 02/09/21      OT SHORT TERM GOAL #2   Title Pt will complete UB dressing with min A for pull over short sleeve shirt.    Baseline mod A    Time 4    Period Weeks    Status Achieved   per pt report - completing with min A for hemi arm.     OT SHORT TERM GOAL #3   Title Pt and caregiver will verbalize understanding of adapted strategies and/or equipment for completing toilet and tub transfers with mod A consistently.    Baseline currently mod/max and not getting to shower.    Time 4     Period Weeks    Status Not Met   max A for transfers and unable to fully get to tub bench and shower safely at this time and not getting on [facility] consistently at home. still using bed pan. 03/08/21     OT SHORT TERM GOAL #4   Title Pt will achieve 90* of PROM for RUE shoulder flexion to reduce risk of malpositioning and increased pain.    Time 4    Period Weeks    Status Achieved   90* with RUE shoulder flexion 03/08/21     OT SHORT TERM GOAL #5   Title Pt and caregivers will verbalize and demonstrate understanding of wear and care of any splints and/or orthoses PRN.    Time 4    Period Weeks    Status Achieved               OT Long Term Goals - 03/15/21 1700       OT LONG TERM GOAL #1   Title Pt and caregivers will be independent with and updated HEPs    Time 10    Period Weeks    Status On-going      OT LONG TERM GOAL #2   Title Pt will complete toilet and tub transfers consistently with min assistance with adapted strategies and equipment PRN    Baseline mod A for stand pivot    Time 10    Period Weeks    Status Deferred   Pt consistently mod - max transfer. 03/08/21     OT LONG TERM GOAL #3   Title Pt will complete bathing with min A    Baseline mod-max    Time 10    Period Weeks    Status On-going   simulated min A in the clinic 03/08/21 - pt able to reach 75% with washcloth while seated in tilt in space chair.     OT LONG TERM GOAL #4   Title Pt will demonstrate composite flexion/extension of 15% or greater in RUE for preparing for active grasp/release and increasing functional use.    Baseline trace extension    Time 10    Period Weeks    Status On-going   trace extension/approx 10% flexion 03/08/21     OT LONG TERM GOAL #5   Title Pt will achieve -20 degrees of elbow extension in RUE  of PROM or decreasing risk of skin breakdown    Baseline -40*    Time 10    Period Weeks    Status On-going   -25* 03/08/21     OT LONG TERM GOAL #6   Title Pt and son  will verbalize understanding of home modifications/equipment and/or adapted strategies for increasing independence with transfers for bathing and toileting and increasing safety and independence with ADLs/IADLs. (BSC, sponge baths vs tub bench, long handled sponge, cutting food, etc)    Time 12    Period Weeks    Status New                   Plan - 03/21/21 1439     Clinical Impression Statement Received verbal authorization from [place] representative for seeing patient today. Pt with increased range of motion and mobility with R hip today increasing ability for sitting edge of mat and performing transfers.    OT Occupational Profile and History Problem Focused Assessment - Including review of records relating to presenting problem    Occupational performance deficits (Please refer to evaluation for details): IADL's;ADL's;Leisure    Body Structure / Function / Physical Skills Tone;Strength;ADL;FMC;Mobility;GMC;ROM;IADL;UE functional use;Decreased knowledge of use of DME    Cognitive Skills Problem Solve    Rehab Potential Good    Clinical Decision Making Limited treatment options, no task modification necessary    Comorbidities Affecting Occupational Performance: None    Modification or Assistance to Complete Evaluation  No modification of tasks or assist necessary to complete eval    OT Frequency 2x / week    OT Duration 12 weeks   16 visits after 11/15 pending VA auth/approval   OT Treatment/Interventions Self-care/ADL training;Manual Therapy;Patient/family education;Electrical Stimulation;Neuromuscular education;Functional Mobility Training;Passive range of motion;Cognitive remediation/compensation;Therapeutic exercise;Moist Heat;DME and/or AE instruction;Therapeutic activities;Aquatic Therapy;Splinting    Plan received verbal auth 11/28 from [place] representative over phone at front desk to see patient, continue working towards RUE NMR, trunk and hip mobility for increasing transfer  independence.    OT Home Exercise Plan self PROM    Consulted and Agree with Plan of Care Patient;Family member/caregiver    Family Member Consulted son, [place]             Patient will benefit from skilled therapeutic intervention in order to improve the following deficits and impairments:   Body Structure / Function / Physical Skills: Tone, Strength, ADL, FMC, Mobility, GMC, ROM, IADL, UE functional use, Decreased knowledge of use of DME Cognitive Skills: Problem Solve     Visit Diagnosis: Hemiplegia and hemiparesis following cerebral infarction affecting right dominant side (HCC)  Muscle weakness (generalized)  Stiffness of right shoulder, not elsewhere classified  Stiffness of right elbow, not elsewhere classified  Other lack of coordination  Attention and concentration deficit  Unsteadiness on feet    Problem List Patient Active Problem List   Diagnosis Date Noted   Cerebrovascular accident (CVA) ([place])    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   Hypertensive urgency 03/12/2020   Acute on chronic systolic CHF (congestive heart failure) ([place]) 03/12/2020   AKI (acute kidney injury) ([place]) 03/12/2020   Fall at home, initial encounter 03/12/2020   Right hip pain 03/12/2020   Hypokalemia 03/12/2020   Coronary artery disease involving native coronary artery of native heart without angina pectoris 09/07/2014   Renal insufficiency 12/02/2012   Preventative health care 11/13/2010   Secondary cardiomyopathy ([place]) 01/24/2010   TACHYCARDIA 01/24/2010   Type 2 diabetes mellitus without  complication, with long-term current use of insulin ([place]) 11/20/2008   Mixed hyperlipidemia 11/12/2007   Essential hypertension 11/12/2007   CONGESTIVE HEART FAILURE 11/12/2007    Zachery Conch, OT/L 03/21/2021, 2:41 PM  [facility] [address] [place], [place], [address] Phone: [phone]   Fax:   [phone]  Name: Trevor Anderson MRN: [id number] Date of Birth: 1955/04/30

## 2021-03-23 ENCOUNTER — Ambulatory Visit: Payer: No Typology Code available for payment source | Admitting: Physical Therapy

## 2021-03-23 ENCOUNTER — Encounter: Payer: Self-pay | Admitting: Occupational Therapy

## 2021-03-23 ENCOUNTER — Ambulatory Visit: Payer: No Typology Code available for payment source | Admitting: Occupational Therapy

## 2021-03-23 ENCOUNTER — Other Ambulatory Visit: Payer: Self-pay

## 2021-03-23 DIAGNOSIS — R2681 Unsteadiness on feet: Secondary | ICD-10-CM

## 2021-03-23 DIAGNOSIS — R41840 Attention and concentration deficit: Secondary | ICD-10-CM

## 2021-03-23 DIAGNOSIS — I69351 Hemiplegia and hemiparesis following cerebral infarction affecting right dominant side: Secondary | ICD-10-CM

## 2021-03-23 DIAGNOSIS — M25611 Stiffness of right shoulder, not elsewhere classified: Secondary | ICD-10-CM

## 2021-03-23 DIAGNOSIS — M6281 Muscle weakness (generalized): Secondary | ICD-10-CM

## 2021-03-23 DIAGNOSIS — R293 Abnormal posture: Secondary | ICD-10-CM

## 2021-03-23 DIAGNOSIS — G463 Brain stem stroke syndrome: Secondary | ICD-10-CM | POA: Diagnosis not present

## 2021-03-23 DIAGNOSIS — R278 Other lack of coordination: Secondary | ICD-10-CM

## 2021-03-23 DIAGNOSIS — M25621 Stiffness of right elbow, not elsewhere classified: Secondary | ICD-10-CM

## 2021-03-23 NOTE — Therapy (Signed)
[facility] [address] [place], [place], [address] Phone: [phone]   Fax:  [phone]  Occupational Therapy Treatment  Patient Details  Name: Trevor Anderson MRN: [id number] Date of Birth: 10-29-1955 Referring Provider (OT): Deland Pretty, NP   Encounter Date: 03/23/2021   OT End of Session - 03/23/21 1708     Visit Number 18    Number of Visits 30    Date for OT Re-Evaluation 05/31/21    Authorization Type VA    Authorization Time Period 15 visits approved 01/12/21 - 04/27/21    Authorization - Visit Number 17    Authorization - Number of Visits 30    Progress Note Due on Visit 20    OT Start Time 1324    OT Stop Time 1615    OT Time Calculation (min) 45 min    Activity Tolerance Patient tolerated treatment well    Behavior During Therapy [facility] for tasks assessed/performed             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   CONGESTIVE HEART FAILURE 11/12/2007   DIABETES MELLITUS, TYPE II 11/20/2008   HYPERLIPIDEMIA 11/12/2007   HYPERTENSION 11/12/2007   TACHYCARDIA 01/24/2010    Past Surgical History:  Procedure Laterality Date   BUBBLE STUDY  03/16/2020   Procedure: BUBBLE STUDY;  Surgeon: Buford Dresser, MD;  Location: [place];  Service: Cardiovascular;;   LOOP RECORDER INSERTION N/A 03/23/2020   Procedure: LOOP RECORDER INSERTION;  Surgeon: Thompson Grayer, MD;  Location: [facility];  Service: Cardiovascular;  Laterality: N/A;   TEE WITHOUT CARDIOVERSION N/A 03/16/2020   Procedure: TRANSESOPHAGEAL ECHOCARDIOGRAM (TEE);  Surgeon: Buford Dresser, MD;  Location: [facility] ENDOSCOPY;  Service: Cardiovascular;  Laterality: N/A;   TONSILLECTOMY  1970    There were no vitals filed for this visit.   Subjective Assessment - 03/23/21 1659     Subjective  Patient's caregiver indicates that she was able to stand him with assistance on his right side  at home    Patient is accompanied by: --   caregiver   Pertinent History HLD, HTN, DM, CHF    Limitations Fall Risk. Max/Total stand pivot transfer w RW. Loop Recorder (no ESTIM)    Patient Stated Goals to move it (RUE)    Currently in Pain? No/denies    Pain Score 0-No pain                          OT Treatments/Exercises (OP) - 03/23/21 1700       Neurological Re-education Exercises   Other Exercises 1 Neuromuscular reeducation to address postural control.  Patient needs increased assistance for weight shift forward, to load feet for lift off.  Transferred from wheelchair toward right with min assist for weight shift forward.  From elevated mat surface, with facilitation for alignment of RLE - able to stand with mod assist initially, then min assist as patient gained confidence.  Worked on standing weight shifts - postural control in standing - weight shifting toward right and left.  Patient able to remin standing x 1 minute without UE support.    Other Exercises 2 Worked on seated weight shift toward right to free up right upper extremity after alignment.  Patient placed onto mobile surface and able to participate in pre-reach patterns - shoulder flexion with elbow extension (active guided assist) and shoulder extension with elbow flexion.  OT Short Term Goals - 03/23/21 1710       OT SHORT TERM GOAL #1   Title Pt and caregivers will be independent with HEP    Time 4    Period Weeks    Status Achieved    Target Date 02/09/21      OT SHORT TERM GOAL #2   Title Pt will complete UB dressing with min A for pull over short sleeve shirt.    Baseline mod A    Time 4    Period Weeks    Status Achieved   per pt report - completing with min A for hemi arm.     OT SHORT TERM GOAL #3   Title Pt and caregiver will verbalize understanding of adapted strategies and/or equipment for completing toilet and tub transfers with mod A consistently.     Baseline currently mod/max and not getting to shower.    Time 4    Period Weeks    Status Not Met   max A for transfers and unable to fully get to tub bench and shower safely at this time and not getting on [facility] consistently at home. still using bed pan. 03/08/21     OT SHORT TERM GOAL #4   Title Pt will achieve 90* of PROM for RUE shoulder flexion to reduce risk of malpositioning and increased pain.    Time 4    Period Weeks    Status Achieved   90* with RUE shoulder flexion 03/08/21     OT SHORT TERM GOAL #5   Title Pt and caregivers will verbalize and demonstrate understanding of wear and care of any splints and/or orthoses PRN.    Time 4    Period Weeks    Status Achieved               OT Long Term Goals - 03/23/21 1710       OT LONG TERM GOAL #1   Title Pt and caregivers will be independent with and updated HEPs    Time 10    Period Weeks    Status On-going      OT LONG TERM GOAL #2   Title Pt will complete toilet and tub transfers consistently with min assistance with adapted strategies and equipment PRN    Baseline mod A for stand pivot    Time 10    Period Weeks    Status Deferred   Pt consistently mod - max transfer. 03/08/21     OT LONG TERM GOAL #3   Title Pt will complete bathing with min A    Baseline mod-max    Time 10    Period Weeks    Status On-going   simulated min A in the clinic 03/08/21 - pt able to reach 75% with washcloth while seated in tilt in space chair.     OT LONG TERM GOAL #4   Title Pt will demonstrate composite flexion/extension of 15% or greater in RUE for preparing for active grasp/release and increasing functional use.    Baseline trace extension    Time 10    Period Weeks    Status On-going   trace extension/approx 10% flexion 03/08/21     OT LONG TERM GOAL #5   Title Pt will achieve -20 degrees of elbow extension in RUE of PROM or decreasing risk of skin breakdown    Baseline -40*    Time 10    Period Weeks    Status  On-going   -  25* 03/08/21     OT LONG TERM GOAL #6   Title Pt and son will verbalize understanding of home modifications/equipment and/or adapted strategies for increasing independence with transfers for bathing and toileting and increasing safety and independence with ADLs/IADLs. (BSC, sponge baths vs tub bench, long handled sponge, cutting food, etc)    Time 12    Period Weeks    Status New                   Plan - 03/23/21 1709     Clinical Impression Statement Patient is showing progress with postural control and functional mobility.  Caregiver reporting improved ability to transfer at home.    OT Occupational Profile and History Problem Focused Assessment - Including review of records relating to presenting problem    Occupational performance deficits (Please refer to evaluation for details): IADL's;ADL's;Leisure    Body Structure / Function / Physical Skills Tone;Strength;ADL;FMC;Mobility;GMC;ROM;IADL;UE functional use;Decreased knowledge of use of DME    Cognitive Skills Problem Solve    Rehab Potential Good    Clinical Decision Making Limited treatment options, no task modification necessary    Comorbidities Affecting Occupational Performance: None    Modification or Assistance to Complete Evaluation  No modification of tasks or assist necessary to complete eval    OT Frequency 2x / week    OT Duration 12 weeks   16 visits after 11/15 pending VA auth/approval   OT Treatment/Interventions Self-care/ADL training;Manual Therapy;Patient/family education;Electrical Stimulation;Neuromuscular education;Functional Mobility Training;Passive range of motion;Cognitive remediation/compensation;Therapeutic exercise;Moist Heat;DME and/or AE instruction;Therapeutic activities;Aquatic Therapy;Splinting    Plan continue working towards RUE NMR, trunk and hip mobility for increasing transfer independence.    OT Home Exercise Plan self PROM    Consulted and Agree with Plan of Care  Patient;Family member/caregiver    Family Member Consulted son, Ramesses             Patient will benefit from skilled therapeutic intervention in order to improve the following deficits and impairments:   Body Structure / Function / Physical Skills: Tone, Strength, ADL, FMC, Mobility, GMC, ROM, IADL, UE functional use, Decreased knowledge of use of DME Cognitive Skills: Problem Solve     Visit Diagnosis: Hemiplegia and hemiparesis following cerebral infarction affecting right dominant side (HCC)  Muscle weakness (generalized)  Unsteadiness on feet  Stiffness of right shoulder, not elsewhere classified  Stiffness of right elbow, not elsewhere classified  Other lack of coordination  Attention and concentration deficit  Abnormal posture    Problem List Patient Active Problem List   Diagnosis Date Noted   Cerebrovascular accident (CVA) (HCC)    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   Hypertensive urgency 03/12/2020   Acute on chronic systolic CHF (congestive heart failure) (HCC) 03/12/2020   AKI (acute kidney injury) (HCC) 03/12/2020   Fall at home, initial encounter 03/12/2020   Right hip pain 03/12/2020   Hypokalemia 03/12/2020   Coronary artery disease involving native coronary artery of native heart without angina pectoris 09/07/2014   Renal insufficiency 12/02/2012   Preventative health care 11/13/2010   Secondary cardiomyopathy (HCC) 01/24/2010   TACHYCARDIA 01/24/2010   Type 2 diabetes mellitus without complication, with long-term current use of insulin (HCC) 11/20/2008   Mixed hyperlipidemia 11/12/2007   Essential hypertension 11/12/2007   CONGESTIVE HEART FAILURE 11/12/2007    Collier Salina, OT/L 03/23/2021, 5:10 PM  [facility] [address] [place], [place], [address] Phone: [phone]   Fax:  [phone]  Name: Trevor Anderson MRN: [id number] Date of Birth:  02-17-1956

## 2021-03-23 NOTE — Therapy (Addendum)
[facility] [address] [place], [place], [address] Phone: [phone]   Fax:  [phone]  Physical Therapy Treatment  Patient Details  Name: Trevor Anderson MRN: [id number] Date of Birth: [birth year] Referring Provider (PT): Noel Gerold   Encounter Date: 03/23/2021   PT End of Session - 03/23/21 1616     Visit Number 25    Number of Visits 30    Date for PT Re-Evaluation 05/03/21   per recert on 03/04/21   Authorization Type VA auth# [id number] (15 visits); VA has approved additional 15 PT visits to 05/26/2021    Authorization - Visit Number 10    Authorization - Number of Visits 15    PT Start Time 1616    PT Stop Time 1656    PT Time Calculation (min) 40 min    Equipment Utilized During Treatment Gait belt   Blue Rocker AFO   Activity Tolerance Patient tolerated treatment well;Patient limited by fatigue    Behavior During Therapy [facility] for tasks assessed/performed             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   CONGESTIVE HEART FAILURE 11/12/2007   DIABETES MELLITUS, TYPE II 11/20/2008   HYPERLIPIDEMIA 11/12/2007   HYPERTENSION 11/12/2007   TACHYCARDIA 01/24/2010    Past Surgical History:  Procedure Laterality Date   BUBBLE STUDY  03/16/2020   Procedure: BUBBLE STUDY;  Surgeon: Jodelle Red, MD;  Location: [facility] ENDOSCOPY;  Service: Cardiovascular;;   LOOP RECORDER INSERTION N/A 03/23/2020   Procedure: LOOP RECORDER INSERTION;  Surgeon: Hillis Range, MD;  Location: [facility];  Service: Cardiovascular;  Laterality: N/A;   TEE WITHOUT CARDIOVERSION N/A 03/16/2020   Procedure: TRANSESOPHAGEAL ECHOCARDIOGRAM (TEE);  Surgeon: Jodelle Red, MD;  Location: [facility] ENDOSCOPY;  Service: Cardiovascular;  Laterality: N/A;   TONSILLECTOMY  1970    There were no vitals filed for this visit.   Subjective Assessment - 03/23/21 1618     Subjective  Tried some standing at home at the countertop and it went well.    Patient is accompained by: --   Cargiver   Limitations Standing    How long can you stand comfortably? 5-10 minutes    Patient Stated Goals Open/close hand; Get more R LE motion;    Currently in Pain? No/denies    Pain Onset In the past 7 days    Pain Onset Today                               [facility] Adult PT Treatment/Exercise - 03/23/21 1637       Transfers   Transfers Sit to Stand;Stand to Sit    Sit to Stand 3: Mod assist    Sit to Stand Details Tactile cues for weight shifting;Tactile cues for placement;Verbal cues for technique;Verbal cues for sequencing    Sit to Stand Details (indicate cue type and reason) From pt's w/c -x1 needing cues to  scoot out to the edge (therapist assisting with scooting R hip out towards the edge), assist to place RUE on RW before standing, cues for incr forward lean to stand on the count of 3 with momentum, PT placing her foot in between/behind pt's R foot to prevent it from moving once pt stood up.    Stand to Sit 3: Mod assist    Stand to Sit Details (indicate cue type and reason) Verbal  cues for sequencing;Verbal cues for technique    Stand to Sit Details Cues to reach back after gait, with decr eccentric control. PT having to tip back RW as pt still holding onto it with RUE.      Ambulation/Gait   Ambulation/Gait Yes    Ambulation/Gait Assistance 3: Mod assist   of 2 therapists with close w/c follow   Ambulation/Gait Assistance Details Continued with use of blue rocker AFO, performed with +2 therapists. With cues/facilitation for upright posture, standing tall on stance leg for hip/quad extension, and weight shifting. Therapist having to help steady RW at times. PT providing assist for RLE foot clearance and occassional R foot placement for wider BOS. Did use shoe cover on RLE for 2nd bout of gait which did seem to help.  Needed seated rest break between each bout, pt  more fatigued today.    Ambulation Distance (Feet) 6 Feet   x2   Assistive device Rolling walker;Other (Comment)   R AFO   Gait Pattern Step-to pattern;Decreased stride length;Decreased step length - right;Decreased step length - left;Decreased stance time - right;Decreased hip/knee flexion - right;Decreased dorsiflexion - right;Decreased weight shift to right;Right flexed knee in stance;Decreased trunk rotation;Narrow base of support;Poor foot clearance - right    Ambulation Surface Level;Indoor      Neuro Re-ed    Neuro Re-ed Details  From elevated mat table and back of pt's w/c in front of pt with pt holding onto w/c handle with RUE, performed x3 reps of sit <> stands with pt needing min A (therapist blocking pt's R knee and assisting at pt's hips for weight shifting). Once in standing, cues for tall posture, hip extension and standing tall through RLE. During 1 rep of pt performing shifting to RLE and taking 4 steps with LLE and back to midline. PT tech providing min guard.      Knee/Hip Exercises: Aerobic   Other Aerobic Scift from wheelchair with rubber blocks used for wheelchair stability with bil LE's/left UE on level 1.3 x for strengtheing, full range of motion, increased right LE use and activity tolerance. Cues to maintain speed. Performed at end of session with pt being more fatigued.                       PT Short Term Goals - 03/08/21 1307       PT SHORT TERM GOAL #1   Title Pt will continue to perform HEP with caregiver assistance, for improved flexibility, strength, transfers.    Baseline reports completing 2-3x/week with caregiver assistance    Time 4    Period Weeks    Status On-going    Target Date 04/05/21      PT SHORT TERM GOAL #2   Title Pt will perform sit to stand transfer with min/mod assist from mat table with RW.    Baseline needs mod/max A (depending on the day)    Time 4    Period Weeks    Status Revised      PT SHORT TERM GOAL #3    Title Pt will perform stand pivot transfers with RW from w/c <> mat table with mod A x1 in order to demo improved functional transfers.    Baseline --    Time 4    Period Weeks    Status New      PT SHORT TERM GOAL #4   Title Pt will ambulate at least 20' with RW with min-mod A +  2 in order to demo improved functional mobility.    Baseline 5' of mod A +2    Time 4    Period Weeks    Status New               PT Long Term Goals - 03/04/21 1434       PT LONG TERM GOAL #1   Title Pt will be able to perform progression of/finalized HEP and perform standing consistently at home with caregiver assist,  TARGET 05/03/21    Baseline pt's son reports working on exercises at home with caregiver - pt will benefit from ongoing revisions/additions, pt not performing standing at home with caregivers.    Time 8    Period Weeks    Status Revised    Target Date 05/03/21      PT LONG TERM GOAL #2   Title Pt will be able to perform all edge of bed scooting with min A for improved ease of transfers, decreased caregiver burden    Baseline 03/04/2021: pt requires cues and min assist to laterally scoot to L, and mod assist and cues to laterally scoot to R    Time 8    Period Weeks    Status On-going      PT LONG TERM GOAL #3   Title Caregiver will report standing at sink or RW at home, for at least 5 minutes, at least 5 days per week, with min guard>supervision, for improved standing tolerance for ADL participation.    Baseline 03/04/2021: pt's son reports not much standing at home    Time 8    Period Weeks    Status On-going      PT LONG TERM GOAL #4   Title Pt will be able to perform wheelchair transfer standpivot with min A for improved independence vs. sliding board transfer with min guard, in order to decrease caregiver burden    Baseline pt needing mod A to perform transfer    Time 8    Period Weeks    Status On-going      PT LONG TERM GOAL #5   Title Pt will ambulate at least 30'  with RW with min A +2 in order to demo improved functional mobility.    Baseline 5' of mod A +2    Time 8    Period Weeks    Status New      Additional Long Term Goals   Additional Long Term Goals Yes      PT LONG TERM GOAL #6   Title Pt will perform sit <> stands with RW and min A in order to demo improved functional transfers to decr caregiver burden.    Time 8    Period Weeks    Status New                   Plan - 03/24/21 0743     Clinical Impression Statement Continued to work on gait training with R blue rocker AFO and +2 mod assist with close w/c follow. Pt needing assist from PT to help clear RLE throughout gait. Used a blue shoe cover during 2nd bout. Pt more fatigued after OT session prior today and only able to ambulate a max distance of 6' today. Will continue to progress towards LTGs.    Personal Factors and Comorbidities Age;Time since onset of injury/illness/exacerbation    Examination-Activity Limitations Locomotion Level;Transfers;Sit;Stand;Toileting;Dressing;Hygiene/Grooming;Self Feeding;Bed Mobility;Bend;Bathing    Examination-Participation Restrictions Community Activity;Shop;Meal Prep;Pincus Badder Work  Stability/Clinical Decision Making Evolving/Moderate complexity    Rehab Potential Fair    PT Frequency 2x / week    PT Duration 8 weeks    PT Treatment/Interventions ADLs/Self Care Home Management;Electrical Stimulation;DME Instruction;Gait training;Functional mobility training;Therapeutic activities;Therapeutic exercise;Balance training;Neuromuscular re-education;Manual techniques;Patient/family education;Orthotic Fit/Training;Wheelchair mobility training;Passive range of motion;Dry needling;Taping    PT Next Visit Plan Standing tolerance in // bars or edge of mat from elevated surface. Continue to work on LE strengthening, stand pivot transfers with RW vs. squat pivot transfers, pre gait activities. LE stretching to assist with tone/spasticity management prior to  any standing or transfers. continue to work on gait with 2 person assit/Bluerocker AFO.    PT Home Exercise Plan Forward lean x20 with caregiver; standing 3x daily for at least 30 sec with caregiver; [employer] 12/29/20    Consulted and Agree with Plan of Care Patient             Patient will benefit from skilled therapeutic intervention in order to improve the following deficits and impairments:  Abnormal gait, Difficulty walking, Impaired tone, Decreased range of motion, Decreased coordination, Decreased endurance, Impaired UE functional use, Decreased activity tolerance, Pain, Decreased balance, Decreased mobility, Decreased strength, Impaired sensation, Postural dysfunction  Visit Diagnosis: Hemiplegia and hemiparesis following cerebral infarction affecting right dominant side (HCC)  Muscle weakness (generalized)  Unsteadiness on feet     Problem List Patient Active Problem List   Diagnosis Date Noted   Cerebrovascular accident (CVA) (HCC)    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   Hypertensive urgency 03/12/2020   Acute on chronic systolic CHF (congestive heart failure) (HCC) 03/12/2020   AKI (acute kidney injury) (HCC) 03/12/2020   Fall at home, initial encounter 03/12/2020   Right hip pain 03/12/2020   Hypokalemia 03/12/2020   Coronary artery disease involving native coronary artery of native heart without angina pectoris 09/07/2014   Renal insufficiency 12/02/2012   Preventative health care 11/13/2010   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 [place], [place], [address] Phone:  [phone]   Fax:  [phone]  Name: Trevor Anderson MRN: [id number] Date of Birth: [birth year]

## 2021-03-29 ENCOUNTER — Encounter: Payer: No Typology Code available for payment source | Admitting: Occupational Therapy

## 2021-03-29 ENCOUNTER — Ambulatory Visit: Payer: No Typology Code available for payment source | Admitting: Physical Therapy

## 2021-03-31 ENCOUNTER — Ambulatory Visit: Payer: No Typology Code available for payment source | Admitting: Occupational Therapy

## 2021-03-31 ENCOUNTER — Encounter: Payer: Self-pay | Admitting: Physical Therapy

## 2021-03-31 ENCOUNTER — Encounter: Payer: Self-pay | Admitting: Occupational Therapy

## 2021-03-31 ENCOUNTER — Ambulatory Visit: Payer: No Typology Code available for payment source | Attending: Adult Health | Admitting: Physical Therapy

## 2021-03-31 ENCOUNTER — Other Ambulatory Visit: Payer: Self-pay

## 2021-03-31 DIAGNOSIS — R2681 Unsteadiness on feet: Secondary | ICD-10-CM

## 2021-03-31 DIAGNOSIS — M25621 Stiffness of right elbow, not elsewhere classified: Secondary | ICD-10-CM

## 2021-03-31 DIAGNOSIS — M6281 Muscle weakness (generalized): Secondary | ICD-10-CM

## 2021-03-31 DIAGNOSIS — R41840 Attention and concentration deficit: Secondary | ICD-10-CM | POA: Diagnosis present

## 2021-03-31 DIAGNOSIS — I69351 Hemiplegia and hemiparesis following cerebral infarction affecting right dominant side: Secondary | ICD-10-CM | POA: Diagnosis not present

## 2021-03-31 DIAGNOSIS — R293 Abnormal posture: Secondary | ICD-10-CM | POA: Diagnosis present

## 2021-03-31 DIAGNOSIS — M25611 Stiffness of right shoulder, not elsewhere classified: Secondary | ICD-10-CM | POA: Insufficient documentation

## 2021-03-31 DIAGNOSIS — R278 Other lack of coordination: Secondary | ICD-10-CM

## 2021-03-31 NOTE — Therapy (Signed)
[facility] [address] [place], [place], [address] Phone: [phone]   Fax:  [phone]  Occupational Therapy Treatment  Patient Details  Name: Trevor Anderson MRN: [id number] Date of Birth: 02/23/1956 Referring Provider (OT): Deland Pretty, NP   Encounter Date: 03/31/2021   OT End of Session - 03/31/21 1320     Visit Number 19    Number of Visits 30    Date for OT Re-Evaluation 05/31/21    Authorization Type VA    Authorization Time Period 30 visits approved thru 05/12/2021   15+15   Authorization - Visit Number 18    Authorization - Number of Visits 30    Progress Note Due on Visit 20    OT Start Time 1318    OT Stop Time 1400    OT Time Calculation (min) 42 min    Activity Tolerance Patient tolerated treatment well    Behavior During Therapy [facility] for tasks assessed/performed             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   CONGESTIVE HEART FAILURE 11/12/2007   DIABETES MELLITUS, TYPE II 11/20/2008   HYPERLIPIDEMIA 11/12/2007   HYPERTENSION 11/12/2007   TACHYCARDIA 01/24/2010    Past Surgical History:  Procedure Laterality Date   BUBBLE STUDY  03/16/2020   Procedure: BUBBLE STUDY;  Surgeon: Buford Dresser, MD;  Location: [place];  Service: Cardiovascular;;   LOOP RECORDER INSERTION N/A 03/23/2020   Procedure: LOOP RECORDER INSERTION;  Surgeon: Thompson Grayer, MD;  Location: [facility];  Service: Cardiovascular;  Laterality: N/A;   TEE WITHOUT CARDIOVERSION N/A 03/16/2020   Procedure: TRANSESOPHAGEAL ECHOCARDIOGRAM (TEE);  Surgeon: Buford Dresser, MD;  Location: [facility] ENDOSCOPY;  Service: Cardiovascular;  Laterality: N/A;   TONSILLECTOMY  1970    There were no vitals filed for this visit.   Subjective Assessment - 03/31/21 1319     Subjective  "feeling alright"    Patient is accompanied by: --   caregiver   Pertinent History HLD,  HTN, DM, CHF    Limitations Fall Risk. Max/Total stand pivot transfer w RW. Loop Recorder (no ESTIM)    Patient Stated Goals to move it (RUE)    Currently in Pain? Yes    Pain Score 1     Pain Location Shoulder    Pain Orientation Right    Pain Descriptors / Indicators Aching    Pain Type Acute pain    Pain Onset More than a month ago    Pain Frequency Intermittent                Standing - without device. Pt able to stand with min A for lifting assistance and managing RLE. Pt stood unassisted for brief period of time approx 10 seconds before needing assistance for recovery.  SB transfer with max A   Forearm weight bearing with mod A for maintaining position and assistance returning to sitting  Forward reaching and assistance for stretching and reaching horizontally for hip flexion and shoulder flexion increased range of motion and ability for increasing ability to stand and decreasing assistance needed for transfers.                    OT Short Term Goals - 03/23/21 1710       OT SHORT TERM GOAL #1   Title Pt and caregivers will be independent with HEP    Time 4  Period Weeks    Status Achieved    Target Date 02/09/21      OT SHORT TERM GOAL #2   Title Pt will complete UB dressing with min A for pull over short sleeve shirt.    Baseline mod A    Time 4    Period Weeks    Status Achieved   per pt report - completing with min A for hemi arm.     OT SHORT TERM GOAL #3   Title Pt and caregiver will verbalize understanding of adapted strategies and/or equipment for completing toilet and tub transfers with mod A consistently.    Baseline currently mod/max and not getting to shower.    Time 4    Period Weeks    Status Not Met   max A for transfers and unable to fully get to tub bench and shower safely at this time and not getting on [facility] consistently at home. still using bed pan. 03/08/21     OT SHORT TERM GOAL #4   Title Pt will achieve 90* of PROM  for RUE shoulder flexion to reduce risk of malpositioning and increased pain.    Time 4    Period Weeks    Status Achieved   90* with RUE shoulder flexion 03/08/21     OT SHORT TERM GOAL #5   Title Pt and caregivers will verbalize and demonstrate understanding of wear and care of any splints and/or orthoses PRN.    Time 4    Period Weeks    Status Achieved               OT Long Term Goals - 03/23/21 1710       OT LONG TERM GOAL #1   Title Pt and caregivers will be independent with and updated HEPs    Time 10    Period Weeks    Status On-going      OT LONG TERM GOAL #2   Title Pt will complete toilet and tub transfers consistently with min assistance with adapted strategies and equipment PRN    Baseline mod A for stand pivot    Time 10    Period Weeks    Status Deferred   Pt consistently mod - max transfer. 03/08/21     OT LONG TERM GOAL #3   Title Pt will complete bathing with min A    Baseline mod-max    Time 10    Period Weeks    Status On-going   simulated min A in the clinic 03/08/21 - pt able to reach 75% with washcloth while seated in tilt in space chair.     OT LONG TERM GOAL #4   Title Pt will demonstrate composite flexion/extension of 15% or greater in RUE for preparing for active grasp/release and increasing functional use.    Baseline trace extension    Time 10    Period Weeks    Status On-going   trace extension/approx 10% flexion 03/08/21     OT LONG TERM GOAL #5   Title Pt will achieve -20 degrees of elbow extension in RUE of PROM or decreasing risk of skin breakdown    Baseline -40*    Time 10    Period Weeks    Status On-going   -25* 03/08/21     OT LONG TERM GOAL #6   Title Pt and son will verbalize understanding of home modifications/equipment and/or adapted strategies for increasing independence with transfers for bathing and toileting and  increasing safety and independence with ADLs/IADLs. (BSC, sponge baths vs tub bench, long handled  sponge, cutting food, etc)    Time 12    Period Weeks    Status New                   Plan - 03/31/21 1430     Clinical Impression Statement Pt continues to progress and demos decreased assistance for standing.    OT Occupational Profile and History Problem Focused Assessment - Including review of records relating to presenting problem    Occupational performance deficits (Please refer to evaluation for details): IADL's;ADL's;Leisure    Body Structure / Function / Physical Skills Tone;Strength;ADL;FMC;Mobility;GMC;ROM;IADL;UE functional use;Decreased knowledge of use of DME    Cognitive Skills Problem Solve    Rehab Potential Good    Clinical Decision Making Limited treatment options, no task modification necessary    Comorbidities Affecting Occupational Performance: None    Modification or Assistance to Complete Evaluation  No modification of tasks or assist necessary to complete eval    OT Frequency 2x / week    OT Duration 12 weeks   16 visits after 11/15 pending VA auth/approval   OT Treatment/Interventions Self-care/ADL training;Manual Therapy;Patient/family education;Electrical Stimulation;Neuromuscular education;Functional Mobility Training;Passive range of motion;Cognitive remediation/compensation;Therapeutic exercise;Moist Heat;DME and/or AE instruction;Therapeutic activities;Aquatic Therapy;Splinting    Plan continue working towards RUE NMR, trunk and hip mobility for increasing transfer independence.    OT Home Exercise Plan self PROM    Consulted and Agree with Plan of Care Patient;Family member/caregiver    Family Member Consulted son, Roland             Patient will benefit from skilled therapeutic intervention in order to improve the following deficits and impairments:   Body Structure / Function / Physical Skills: Tone, Strength, ADL, FMC, Mobility, GMC, ROM, IADL, UE functional use, Decreased knowledge of use of DME Cognitive Skills: Problem Solve      Visit Diagnosis: Hemiplegia and hemiparesis following cerebral infarction affecting right dominant side (HCC)  Muscle weakness (generalized)  Unsteadiness on feet  Stiffness of right shoulder, not elsewhere classified  Stiffness of right elbow, not elsewhere classified  Other lack of coordination  Attention and concentration deficit    Problem List Patient Active Problem List   Diagnosis Date Noted   Cerebrovascular accident (CVA) ([place])    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   Hypertensive urgency 03/12/2020   Acute on chronic systolic CHF (congestive heart failure) ([place]) 03/12/2020   AKI (acute kidney injury) ([place]) 03/12/2020   Fall at home, initial encounter 03/12/2020   Right hip pain 03/12/2020   Hypokalemia 03/12/2020   Coronary artery disease involving native coronary artery of native heart without angina pectoris 09/07/2014   Renal insufficiency 12/02/2012   Preventative health care 11/13/2010   Secondary cardiomyopathy ([place]) 01/24/2010   TACHYCARDIA 01/24/2010   Type 2 diabetes mellitus without complication, with long-term current use of insulin ([place]) 11/20/2008   Mixed hyperlipidemia 11/12/2007   Essential hypertension 11/12/2007   CONGESTIVE HEART FAILURE 11/12/2007    Zachery Conch, OT 03/31/2021, 2:31 PM  [facility] [address] [place], [place], [address] Phone: [phone]   Fax:  [phone]  Name: STONEY KARCZEWSKI MRN: [id number] Date of Birth: 06/08/1955

## 2021-03-31 NOTE — Therapy (Signed)
[facility] [address] [place], [place], [address] Phone: [phone]   Fax:  [phone]  Physical Therapy Treatment  Patient Details  Name: Trevor Anderson MRN: [id number] Date of Birth: February 12, 1956 Referring Provider (PT): Noel Gerold   Encounter Date: 03/31/2021   PT End of Session - 03/31/21 1403     Visit Number 26    Number of Visits 30    Date for PT Re-Evaluation 05/03/21   per recert on 03/04/21   Authorization Type VA auth# [id number] (15 visits); VA has approved additional 15 PT visits to 05/26/2021    Authorization - Visit Number 11    Authorization - Number of Visits 15    PT Start Time 1400    PT Stop Time 1443    PT Time Calculation (min) 43 min    Equipment Utilized During Treatment Gait belt   Blue Rocker AFO   Activity Tolerance Patient tolerated treatment well;Patient limited by fatigue    Behavior During Therapy [facility] for tasks assessed/performed             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   CONGESTIVE HEART FAILURE 11/12/2007   DIABETES MELLITUS, TYPE II 11/20/2008   HYPERLIPIDEMIA 11/12/2007   HYPERTENSION 11/12/2007   TACHYCARDIA 01/24/2010    Past Surgical History:  Procedure Laterality Date   BUBBLE STUDY  03/16/2020   Procedure: BUBBLE STUDY;  Surgeon: Jodelle Red, MD;  Location: [facility] ENDOSCOPY;  Service: Cardiovascular;;   LOOP RECORDER INSERTION N/A 03/23/2020   Procedure: LOOP RECORDER INSERTION;  Surgeon: Hillis Range, MD;  Location: [facility] INVASIVE CV LAB;  Service: Cardiovascular;  Laterality: N/A;   TEE WITHOUT CARDIOVERSION N/A 03/16/2020   Procedure: TRANSESOPHAGEAL ECHOCARDIOGRAM (TEE);  Surgeon: Jodelle Red, MD;  Location: [facility] ENDOSCOPY;  Service: Cardiovascular;  Laterality: N/A;   TONSILLECTOMY  1970    There were no vitals filed for this visit.   Subjective Assessment - 03/31/21 1401     Subjective  Has been standing with son at home at sink. No falls.    Patient is accompained by: --   caregiver   Limitations Standing    How long can you stand comfortably? 5-10 minutes    Patient Stated Goals Open/close hand; Get more R LE motion;    Currently in Pain? Yes    Pain Score 1     Pain Location Shoulder    Pain Orientation Right    Pain Descriptors / Indicators Aching    Pain Type Acute pain    Pain Onset More than a month ago    Pain Frequency Intermittent    Aggravating Factors  certain movements of right arm    Pain Relieving Factors resting, stretching                    [facility] Adult PT Treatment/Exercise - 03/31/21 1404       Transfers   Transfers Sit to Stand;Stand to Sit    Sit to Stand 3: Mod assist;From elevated surface;With upper extremity assist;From bed    Sit to Stand Details Tactile cues for weight shifting;Tactile cues for placement;Verbal cues for technique;Verbal cues for sequencing    Sit to Stand Details (indicate cue type and reason) min/mod assist from mat x 3 reps, then mod from wheelchair x 2 reps. cues each time to scoot to edge of surface, for hand placement (right hand on walker with assistance, left hand pushing from surface) and  rocking use for momentum/promote anterior weight shifting.    Stand to Sit 3: Mod assist;With upper extremity assist;To bed    Stand to Sit Details (indicate cue type and reason) Verbal cues for sequencing;Verbal cues for technique    Stand to Sit Details cues to reach back each time with assistance for controlled descent      Ambulation/Gait   Ambulation/Gait Yes    Ambulation/Gait Assistance 4: Min assist;3: Mod assist   of 2 therapist/student therapist with close chair follow   Ambulation/Gait Assistance Details continued with use of blue rocker AFO with all gait and simulated toe cap added for last 2 gait reps. cues/facilitation for posture, weight shifting and right LE advancement/step placement. pt able to self  advance RW and left LE with gait.    Ambulation Distance (Feet) 25 Feet   x1, 12 x1, 20 x1   Assistive device Rolling walker;Other (Comment)    Gait Pattern Step-to pattern;Decreased stride length;Decreased step length - right;Decreased step length - left;Decreased stance time - right;Decreased hip/knee flexion - right;Decreased dorsiflexion - right;Decreased weight shift to right;Right flexed knee in stance;Decreased trunk rotation;Narrow base of support;Poor foot clearance - right    Ambulation Surface Level;Indoor      Exercises   Exercises Other Exercises    Other Exercises  seated at edge of mat with inverted chair/pillows behind patient prior to standing: passvie stretching of heel cords, hamstrings and hip adductors. then long arc quads mod/max active assist for 10 reps.                  PT Short Term Goals - 03/08/21 1307       PT SHORT TERM GOAL #1   Title Pt will continue to perform HEP with caregiver assistance, for improved flexibility, strength, transfers.    Baseline reports completing 2-3x/week with caregiver assistance    Time 4    Period Weeks    Status On-going    Target Date 04/05/21      PT SHORT TERM GOAL #2   Title Pt will perform sit to stand transfer with min/mod assist from mat table with RW.    Baseline needs mod/max A (depending on the day)    Time 4    Period Weeks    Status Revised      PT SHORT TERM GOAL #3   Title Pt will perform stand pivot transfers with RW from w/c <> mat table with mod A x1 in order to demo improved functional transfers.    Baseline --    Time 4    Period Weeks    Status New      PT SHORT TERM GOAL #4   Title Pt will ambulate at least 20' with RW with min-mod A +2 in order to demo improved functional mobility.    Baseline 5' of mod A +2    Time 4    Period Weeks    Status New               PT Long Term Goals - 03/04/21 1434       PT LONG TERM GOAL #1   Title Pt will be able to perform progression  of/finalized HEP and perform standing consistently at home with caregiver assist,  TARGET 05/03/21    Baseline pt's son reports working on exercises at home with caregiver - pt will benefit from ongoing revisions/additions, pt not performing standing at home with caregivers.    Time 8  Period Weeks    Status Revised    Target Date 05/03/21      PT LONG TERM GOAL #2   Title Pt will be able to perform all edge of bed scooting with min A for improved ease of transfers, decreased caregiver burden    Baseline 03/04/2021: pt requires cues and min assist to laterally scoot to L, and mod assist and cues to laterally scoot to R    Time 8    Period Weeks    Status On-going      PT LONG TERM GOAL #3   Title Caregiver will report standing at sink or RW at home, for at least 5 minutes, at least 5 days per week, with min guard>supervision, for improved standing tolerance for ADL participation.    Baseline 03/04/2021: pt's son reports not much standing at home    Time 8    Period Weeks    Status On-going      PT LONG TERM GOAL #4   Title Pt will be able to perform wheelchair transfer standpivot with min A for improved independence vs. sliding board transfer with min guard, in order to decrease caregiver burden    Baseline pt needing mod A to perform transfer    Time 8    Period Weeks    Status On-going      PT LONG TERM GOAL #5   Title Pt will ambulate at least 30' with RW with min A +2 in order to demo improved functional mobility.    Baseline 5' of mod A +2    Time 8    Period Weeks    Status New      Additional Long Term Goals   Additional Long Term Goals Yes      PT LONG TERM GOAL #6   Title Pt will perform sit <> stands with RW and min A in order to demo improved functional transfers to decr caregiver burden.    Time 8    Period Weeks    Status New                   Plan - 03/31/21 1403     Clinical Impression Statement Today's skilled session continued to address  transfer training, LE stretching for tone. strengthening and gait with AFO/RW. Pt with increased consecutive and overall gait distance this session. The pt is progressing and should benefit from continued PT to progress toward unmet goals.    Personal Factors and Comorbidities Age;Time since onset of injury/illness/exacerbation    Examination-Activity Limitations Locomotion Level;Transfers;Sit;Stand;Toileting;Dressing;Hygiene/Grooming;Self Feeding;Bed Mobility;Bend;Bathing    Examination-Participation Restrictions Community Activity;Shop;Meal Prep;Yard Work    Conservation officer, historic buildings Evolving/Moderate complexity    Rehab Potential Fair    PT Frequency 2x / week    PT Duration 8 weeks    PT Treatment/Interventions ADLs/Self Care Home Management;Electrical Stimulation;DME Instruction;Gait training;Functional mobility training;Therapeutic activities;Therapeutic exercise;Balance training;Neuromuscular re-education;Manual techniques;Patient/family education;Orthotic Fit/Training;Wheelchair mobility training;Passive range of motion;Dry needling;Taping    PT Next Visit Plan Standing tolerance in // bars or edge of mat from elevated surface. Continue to work on LE strengthening, stand pivot transfers with RW vs. squat pivot transfers, pre gait activities. LE stretching to assist with tone/spasticity management prior to any standing or transfers. continue to work on gait with 2 person assit/Bluerocker AFO.    PT Home Exercise Plan Forward lean x20 with caregiver; standing 3x daily for at least 30 sec with caregiver; [employer] 12/29/20    Consulted and Agree with Plan  of Care Patient             Patient will benefit from skilled therapeutic intervention in order to improve the following deficits and impairments:  Abnormal gait, Difficulty walking, Impaired tone, Decreased range of motion, Decreased coordination, Decreased endurance, Impaired UE functional use, Decreased activity tolerance, Pain,  Decreased balance, Decreased mobility, Decreased strength, Impaired sensation, Postural dysfunction  Visit Diagnosis: Hemiplegia and hemiparesis following cerebral infarction affecting right dominant side (HCC)  Muscle weakness (generalized)  Unsteadiness on feet     Problem List Patient Active Problem List   Diagnosis Date Noted   Cerebrovascular accident (CVA) (HCC)    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   Hypertensive urgency 03/12/2020   Acute on chronic systolic CHF (congestive heart failure) (HCC) 03/12/2020   AKI (acute kidney injury) (HCC) 03/12/2020   Fall at home, initial encounter 03/12/2020   Right hip pain 03/12/2020   Hypokalemia 03/12/2020   Coronary artery disease involving native coronary artery of native heart without angina pectoris 09/07/2014   Renal insufficiency 12/02/2012   Preventative health care 11/13/2010   Secondary cardiomyopathy (HCC) 01/24/2010   TACHYCARDIA 01/24/2010   Type 2 diabetes mellitus without complication, with long-term current use of insulin (HCC) 11/20/2008   Mixed hyperlipidemia 11/12/2007   Essential hypertension 11/12/2007   CONGESTIVE HEART FAILURE 11/12/2007    Sallyanne Kuster, PTA, [facility] Outpatient Neuro [facility] [address], [address] [place], [place] [address] [phone] 03/31/21, 4:26 PM   Name: Trevor Anderson MRN: [id number] Date of Birth: 11/13/1955

## 2021-04-04 ENCOUNTER — Ambulatory Visit (INDEPENDENT_AMBULATORY_CARE_PROVIDER_SITE_OTHER): Payer: No Typology Code available for payment source

## 2021-04-04 DIAGNOSIS — I639 Cerebral infarction, unspecified: Secondary | ICD-10-CM

## 2021-04-05 ENCOUNTER — Ambulatory Visit: Payer: No Typology Code available for payment source | Admitting: Physical Therapy

## 2021-04-05 ENCOUNTER — Encounter: Payer: Self-pay | Admitting: Occupational Therapy

## 2021-04-05 ENCOUNTER — Other Ambulatory Visit: Payer: Self-pay

## 2021-04-05 ENCOUNTER — Ambulatory Visit: Payer: No Typology Code available for payment source | Admitting: Occupational Therapy

## 2021-04-05 ENCOUNTER — Encounter: Payer: Self-pay | Admitting: Physical Therapy

## 2021-04-05 DIAGNOSIS — M6281 Muscle weakness (generalized): Secondary | ICD-10-CM

## 2021-04-05 DIAGNOSIS — R278 Other lack of coordination: Secondary | ICD-10-CM

## 2021-04-05 DIAGNOSIS — R2681 Unsteadiness on feet: Secondary | ICD-10-CM

## 2021-04-05 DIAGNOSIS — R41840 Attention and concentration deficit: Secondary | ICD-10-CM

## 2021-04-05 DIAGNOSIS — M25621 Stiffness of right elbow, not elsewhere classified: Secondary | ICD-10-CM

## 2021-04-05 DIAGNOSIS — I69351 Hemiplegia and hemiparesis following cerebral infarction affecting right dominant side: Secondary | ICD-10-CM

## 2021-04-05 DIAGNOSIS — R293 Abnormal posture: Secondary | ICD-10-CM

## 2021-04-05 DIAGNOSIS — M25611 Stiffness of right shoulder, not elsewhere classified: Secondary | ICD-10-CM

## 2021-04-05 LAB — CUP PACEART REMOTE DEVICE CHECK
Date Time Interrogation Session: 20221203174650
Implantable Pulse Generator Implant Date: 20211130

## 2021-04-05 NOTE — Therapy (Signed)
[facility] [address] [place], [place], [address] Phone: [phone]   Fax:  [phone]  Physical Therapy Treatment  Patient Details  Name: GEVORK AYYAD MRN: [id number] Date of Birth: [birth year] Referring Provider (PT): Deland Pretty   Encounter Date: 04/05/2021   PT End of Session - 04/05/21 1320     Visit Number 27    Number of Visits 30    Date for PT Re-Evaluation 54/56/25   per recert on 63/89/37   Authorization Type VA auth# [id number] (15 visits); VA has approved additional 15 PT visits to 05/26/2021    Authorization - Visit Number 12    Authorization - Number of Visits 15    PT Start Time 6203    PT Stop Time 5597    PT Time Calculation (min) 41 min    Equipment Utilized During Treatment Gait belt   Blue Rocker AFO   Activity Tolerance Patient tolerated treatment well;Patient limited by fatigue    Behavior During Therapy [facility] for tasks assessed/performed             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   CONGESTIVE HEART FAILURE 11/12/2007   DIABETES MELLITUS, TYPE II 11/20/2008   HYPERLIPIDEMIA 11/12/2007   HYPERTENSION 11/12/2007   TACHYCARDIA 01/24/2010    Past Surgical History:  Procedure Laterality Date   BUBBLE STUDY  03/16/2020   Procedure: BUBBLE STUDY;  Surgeon: Buford Dresser, MD;  Location: [facility];  Service: Cardiovascular;;   LOOP RECORDER INSERTION N/A 03/23/2020   Procedure: LOOP RECORDER INSERTION;  Surgeon: Thompson Grayer, MD;  Location: [facility];  Service: Cardiovascular;  Laterality: N/A;   TEE WITHOUT CARDIOVERSION N/A 03/16/2020   Procedure: TRANSESOPHAGEAL ECHOCARDIOGRAM (TEE);  Surgeon: Buford Dresser, MD;  Location[facility] ENDOSCOPY;  Service: Cardiovascular;  Laterality: N/A;   TONSILLECTOMY  1970    There were no vitals filed for this visit.   Subjective Assessment - 04/05/21 1319     Subjective  Not going to [facility] for rehab. Stated that he is not ready yet. Needs to be more independent.    Patient is accompained by: --   caregiver   Limitations Standing    How long can you stand comfortably? 5-10 minutes    Patient Stated Goals Open/close hand; Get more R LE motion;    Currently in Pain? No/denies    Pain Onset More than a month ago                               [facility] Adult PT Treatment/Exercise - 04/05/21 1321       Transfers   Transfers Sit to Stand;Stand to Sit    Sit to Stand 3: Mod assist;With upper extremity assist;From chair/3-in-1    Sit to Stand Details Tactile cues for weight shifting;Tactile cues for placement;Verbal cues for technique;Verbal cues for sequencing    Sit to Stand Details (indicate cue type and reason) Mod A x3 reps from w/c, with cues to first scoot out towards the edge (needing assist to help scoot R side out), for hand placement with LUE on w/c arm rest and then therapist helping place RUE on RW prior to standing. Cues for incr forward lean and using rocking/momentum to help come to stand. Therapist's foot placed posteriorly and to the middle of pt's RLE to help prevent it from adducting once initially standing.    Stand to  Sit 3: Mod assist;To bed    Stand to Sit Details (indicate cue type and reason) Verbal cues for sequencing;Verbal cues for technique    Stand to Sit Details Cues to reach back towards w/c with RUE, assist for controlled descent and tipping w/c as pt still holding on with RUE      Ambulation/Gait   Ambulation/Gait Yes    Ambulation/Gait Assistance 3: Mod assist   PT and student physical therapist, with PT tech providing wheel chair follow   Ambulation/Gait Assistance Details Continued with use of blue rocker AFO to RLE and used blue shoe cover as simulated toe cap for incr foot clearance. Cues/faciliation throughout for posture, weight shifting, and bringing RLE through during swing phase and for proper placement.  Cues throughout to stand tall through stance leg for incr step length. Needing intermittent assist to help steady/propel RW forwards.    Ambulation Distance (Feet) 17 Feet   x1, 21 x 1   Assistive device Rolling walker;Other (Comment)    Gait Pattern Step-to pattern;Decreased stride length;Decreased step length - right;Decreased step length - left;Decreased stance time - right;Decreased hip/knee flexion - right;Decreased dorsiflexion - right;Decreased weight shift to right;Right flexed knee in stance;Decreased trunk rotation;Narrow base of support;Poor foot clearance - right    Ambulation Surface Indoor;Level    Pre-Gait Activities During initial stand before gait performed x10 reps lateral weight shifting with cues for tall posture and hip/knee extension      Exercises   Exercises Other Exercises    Other Exercises  Pt seated in w/c prior to gait therapist performing seated R hamstring/ankle DF stretch 3 x 45 seconds, cued for patient to sit up tall in w/c      Knee/Hip Exercises: Aerobic   Other Aerobic Scift from wheelchair with rubber blocks used for wheelchair stability with bil LE's/left UE on level 2.0 x 62minutes for strengthening, full range of motion, increased right LE use and activity tolerance. Cues to maintain speed.                       PT Short Term Goals - 04/05/21 1430       PT SHORT TERM GOAL #1   Title Pt will continue to perform HEP with caregiver assistance, for improved flexibility, strength, transfers.    Baseline reports completing 2-3x/week with caregiver assistance, performing standing intermittently at home with son    Time 4    Period Weeks    Status Partially Met    Target Date 04/05/21      PT SHORT TERM GOAL #2   Title Pt will perform sit to stand transfer with min/mod assist from mat table with RW.    Baseline needs mod A from w/c, and min/mod A from mat table.    Time 4    Period Weeks    Status Achieved      PT SHORT TERM GOAL #3    Title Pt will perform stand pivot transfers with RW from w/c <> mat table with mod A x1 in order to demo improved functional transfers.    Time 4    Period Weeks    Status New      PT SHORT TERM GOAL #4   Title Pt will ambulate at least 20' with RW with min-mod A +2 in order to demo improved functional mobility.    Baseline 21' on 04/05/21 with mod A +2 and RW    Time 4    Period Weeks  Status Achieved               PT Long Term Goals - 03/04/21 1434       PT LONG TERM GOAL #1   Title Pt will be able to perform progression of/finalized HEP and perform standing consistently at home with caregiver assist,  TARGET 05/03/21    Baseline pt's son reports working on exercises at home with caregiver - pt will benefit from ongoing revisions/additions, pt not performing standing at home with caregivers.    Time 8    Period Weeks    Status Revised    Target Date 05/03/21      PT LONG TERM GOAL #2   Title Pt will be able to perform all edge of bed scooting with min A for improved ease of transfers, decreased caregiver burden    Baseline 03/04/2021: pt requires cues and min assist to laterally scoot to L, and mod assist and cues to laterally scoot to R    Time 8    Period Weeks    Status On-going      PT LONG TERM GOAL #3   Title Caregiver will report standing at sink or RW at home, for at least 5 minutes, at least 5 days per week, with min guard>supervision, for improved standing tolerance for ADL participation.    Baseline 03/04/2021: pt's son reports not much standing at home    Time 8    Period Weeks    Status On-going      PT LONG TERM GOAL #4   Title Pt will be able to perform wheelchair transfer standpivot with min A for improved independence vs. sliding board transfer with min guard, in order to decrease caregiver burden    Baseline pt needing mod A to perform transfer    Time 8    Period Weeks    Status On-going      PT LONG TERM GOAL #5   Title Pt will ambulate at  least 30' with RW with min A +2 in order to demo improved functional mobility.    Baseline 5' of mod A +2    Time 8    Period Weeks    Status New      Additional Long Term Goals   Additional Long Term Goals Yes      PT LONG TERM GOAL #6   Title Pt will perform sit <> stands with RW and min A in order to demo improved functional transfers to decr caregiver burden.    Time 8    Period Weeks    Status New                   Plan - 04/05/21 1431     Clinical Impression Statement Pt has met STGs #2 and #4. Pt able to perform sit <> stands from w/c with mod A, but from mat table (from last session) able to perform with min/mod A. Does improve with transfers with incr reps. Pt able to ambulate a max distance of 21' today with +2 mod A, blue rocker AFO and simulated toe cap. pt needing w/c follow due to fatigue. Assist needing to help pt advance his RLE throughout and for proper placement. Needs cues to stand tall on stance leg for improved step length. Will continue to progress towards LTGs.    Personal Factors and Comorbidities Age;Time since onset of injury/illness/exacerbation    Examination-Activity Limitations Locomotion Level;Transfers;Sit;Stand;Toileting;Dressing;Hygiene/Grooming;Self Feeding;Bed Mobility;Bend;Bathing    Examination-Participation Restrictions [employer]  Activity;Shop;Meal Prep;Yard Work    [employer] Evolving/Moderate complexity    Rehab Potential Fair    PT Frequency 2x / week    PT Duration 8 weeks    PT Treatment/Interventions ADLs/Self Care Home Management;Electrical Stimulation;DME Instruction;Gait training;Functional mobility training;Therapeutic activities;Therapeutic exercise;Balance training;Neuromuscular re-education;Manual techniques;Patient/family education;Orthotic Fit/Training;Wheelchair mobility training;Passive range of motion;Dry needling;Taping    PT Next Visit Plan any update from [place] with more visits? Standing tolerance in  // bars or edge of mat from elevated surface. Continue to work on LE strengthening, stand pivot transfers with RW vs. squat pivot transfers, pre gait activities. LE stretching to assist with tone/spasticity management prior to any standing or transfers. continue to work on gait with 2 person assit/Bluerocker AFO.    PT Home Exercise Plan Forward lean x20 with caregiver; standing 3x daily for at least 30 sec with caregiver; [place] 12/29/20    Consulted and Agree with Plan of Care Patient             Patient will benefit from skilled therapeutic intervention in order to improve the following deficits and impairments:  Abnormal gait, Difficulty walking, Impaired tone, Decreased range of motion, Decreased coordination, Decreased endurance, Impaired UE functional use, Decreased activity tolerance, Pain, Decreased balance, Decreased mobility, Decreased strength, Impaired sensation, Postural dysfunction  Visit Diagnosis: Hemiplegia and hemiparesis following cerebral infarction affecting right dominant side (HCC)  Muscle weakness (generalized)  Unsteadiness on feet     Problem List Patient Active Problem List   Diagnosis Date Noted   Cerebrovascular accident (CVA) ([place])    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   Hypertensive urgency 03/12/2020   Acute on chronic systolic CHF (congestive heart failure) ([place]) 03/12/2020   AKI (acute kidney injury) ([place]) 03/12/2020   Fall at home, initial encounter 03/12/2020   Right hip pain 03/12/2020   Hypokalemia 03/12/2020   Coronary artery disease involving native coronary artery of native heart without angina pectoris 09/07/2014   Renal insufficiency 12/02/2012   Preventative health care 11/13/2010   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 [place], [place], [address] Phone: [phone]   Fax:  [phone]  Name: JERUSALEM WERT MRN: [id number] Date of Birth: [birth year]

## 2021-04-05 NOTE — Therapy (Signed)
[facility] [address] [place], [place], [address] Phone: [phone]   Fax:  [phone]  Occupational Therapy Treatment and Progress Update  Patient Details  Name: Trevor Anderson MRN: [id number] Date of Birth: Jun 05, 1955 Referring Provider (OT): Deland Pretty, NP   Encounter Date: 04/05/2021   OT End of Session - 04/05/21 1631     Visit Number 20    Number of Visits 30    Date for OT Re-Evaluation 05/31/21    Authorization Type VA    Authorization Time Period 30 visits approved thru 05/12/2021    Authorization - Visit Number 62    Authorization - Number of Visits 30    Progress Note Due on Visit 20    OT Start Time 1400    OT Stop Time 5974    OT Time Calculation (min) 45 min    Activity Tolerance Patient tolerated treatment well    Behavior During Therapy [facility] for tasks assessed/performed             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   CONGESTIVE HEART FAILURE 11/12/2007   DIABETES MELLITUS, TYPE II 11/20/2008   HYPERLIPIDEMIA 11/12/2007   HYPERTENSION 11/12/2007   TACHYCARDIA 01/24/2010    Past Surgical History:  Procedure Laterality Date   BUBBLE STUDY  03/16/2020   Procedure: BUBBLE STUDY;  Surgeon: Buford Dresser, MD;  Location: [facility];  Service: Cardiovascular;;   LOOP RECORDER INSERTION N/A 03/23/2020   Procedure: LOOP RECORDER INSERTION;  Surgeon: Thompson Grayer, MD;  Location: [facility] [facility];  Service: Cardiovascular;  Laterality: N/A;   TEE WITHOUT CARDIOVERSION N/A 03/16/2020   Procedure: TRANSESOPHAGEAL ECHOCARDIOGRAM (TEE);  Surgeon: Buford Dresser, MD;  Location: [facility] ENDOSCOPY;  Service: Cardiovascular;  Laterality: N/A;   TONSILLECTOMY  1970    There were no vitals filed for this visit.   Subjective Assessment - 04/05/21 1409     Subjective  moving my arm and leg a little bit    Pertinent History HLD, HTN, DM, CHF     Limitations Fall Risk. Max/Total stand pivot transfer w RW. Loop Recorder (no ESTIM)    Patient Stated Goals to move it (RUE)    Currently in Pain? No/denies                          OT Treatments/Exercises (OP) - 04/05/21 1626       Neurological Re-education Exercises   Other Exercises 1 Neuromuscular reeducation to address postural control in sitting as needed for functional transfers.  Patient better able to weight shift forward to load feet.  Patient with less hip pain this session, allowing increased particiaption with active scoot pivot transfers.  Patient transitioned to supine then sidelying to stretch right arm.  Supine to sidelying with increasing degrees of shoulder flexion.  Followed with isolated movement of elbow flexion and extension.  Patient pleased to see controlled elbow movement in this position.                      OT Short Term Goals - 04/05/21 1635       OT SHORT TERM GOAL #1   Title Pt and caregivers will be independent with HEP    Time 4    Period Weeks    Status Achieved    Target Date 02/09/21      OT SHORT TERM GOAL #2   Title Pt  will complete UB dressing with min A for pull over short sleeve shirt.    Baseline mod A    Time 4    Period Weeks    Status Achieved   per pt report - completing with min A for hemi arm.     OT SHORT TERM GOAL #3   Title Pt and caregiver will verbalize understanding of adapted strategies and/or equipment for completing toilet and tub transfers with mod A consistently.    Baseline currently mod/max and not getting to shower.    Time 4    Period Weeks    Status Not Met   max A for transfers and unable to fully get to tub bench and shower safely at this time and not getting on [facility] consistently at home. still using bed pan. 03/08/21     OT SHORT TERM GOAL #4   Title Pt will achieve 90* of PROM for RUE shoulder flexion to reduce risk of malpositioning and increased pain.    Time 4    Period  Weeks    Status Achieved   90* with RUE shoulder flexion 03/08/21     OT SHORT TERM GOAL #5   Title Pt and caregivers will verbalize and demonstrate understanding of wear and care of any splints and/or orthoses PRN.    Time 4    Period Weeks    Status Achieved               OT Long Term Goals - 03/23/21 1710       OT LONG TERM GOAL #1   Title Pt and caregivers will be independent with and updated HEPs    Time 10    Period Weeks    Status On-going      OT LONG TERM GOAL #2   Title Pt will complete toilet and tub transfers consistently with min assistance with adapted strategies and equipment PRN    Baseline mod A for stand pivot    Time 10    Period Weeks    Status Deferred   Pt consistently mod - max transfer. 03/08/21     OT LONG TERM GOAL #3   Title Pt will complete bathing with min A    Baseline mod-max    Time 10    Period Weeks    Status On-going   simulated min A in the clinic 03/08/21 - pt able to reach 75% with washcloth while seated in tilt in space chair.     OT LONG TERM GOAL #4   Title Pt will demonstrate composite flexion/extension of 15% or greater in RUE for preparing for active grasp/release and increasing functional use.    Baseline trace extension    Time 10    Period Weeks    Status On-going   trace extension/approx 10% flexion 03/08/21     OT LONG TERM GOAL #5   Title Pt will achieve -20 degrees of elbow extension in RUE of PROM or decreasing risk of skin breakdown    Baseline -40*    Time 10    Period Weeks    Status On-going   -25* 03/08/21     OT LONG TERM GOAL #6   Title Pt and son will verbalize understanding of home modifications/equipment and/or adapted strategies for increasing independence with transfers for bathing and toileting and increasing safety and independence with ADLs/IADLs. (BSC, sponge baths vs tub bench, long handled sponge, cutting food, etc)    Time 12    Period  Weeks    Status New                   Plan  - 04/05/21 1634     Clinical Impression Statement This progress report covers dates of service from 02/23/21-04/05/21.  Pt has shown nice progress relating to functional mobility. Patient with significant muscle tension in right limbs.    OT Occupational Profile and History Problem Focused Assessment - Including review of records relating to presenting problem    Occupational performance deficits (Please refer to evaluation for details): IADL's;ADL's;Leisure    Body Structure / Function / Physical Skills Tone;Strength;ADL;FMC;Mobility;GMC;ROM;IADL;UE functional use;Decreased knowledge of use of DME    Cognitive Skills Problem Solve    Rehab Potential Good    Clinical Decision Making Limited treatment options, no task modification necessary    Comorbidities Affecting Occupational Performance: None    Modification or Assistance to Complete Evaluation  No modification of tasks or assist necessary to complete eval    OT Frequency 2x / week    OT Duration 12 weeks   16 visits after 11/15 pending VA auth/approval   OT Treatment/Interventions Self-care/ADL training;Manual Therapy;Patient/family education;Electrical Stimulation;Neuromuscular education;Functional Mobility Training;Passive range of motion;Cognitive remediation/compensation;Therapeutic exercise;Moist Heat;DME and/or AE instruction;Therapeutic activities;Aquatic Therapy;Splinting    Plan Check goals for progress note:  continue working towards RUE NMR, trunk and hip mobility for increasing transfer independence.    OT Home Exercise Plan self PROM    Consulted and Agree with Plan of Care Patient;Family member/caregiver    Family Member Consulted son, Loye             Patient will benefit from skilled therapeutic intervention in order to improve the following deficits and impairments:   Body Structure / Function / Physical Skills: Tone, Strength, ADL, FMC, Mobility, GMC, ROM, IADL, UE functional use, Decreased knowledge of use of  DME Cognitive Skills: Problem Solve     Visit Diagnosis: Hemiplegia and hemiparesis following cerebral infarction affecting right dominant side (HCC)  Muscle weakness (generalized)  Unsteadiness on feet  Stiffness of right shoulder, not elsewhere classified  Stiffness of right elbow, not elsewhere classified  Other lack of coordination  Attention and concentration deficit  Abnormal posture    Problem List Patient Active Problem List   Diagnosis Date Noted   Cerebrovascular accident (CVA) ([place])    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   Hypertensive urgency 03/12/2020   Acute on chronic systolic CHF (congestive heart failure) ([place]) 03/12/2020   AKI (acute kidney injury) ([place]) 03/12/2020   Fall at home, initial encounter 03/12/2020   Right hip pain 03/12/2020   Hypokalemia 03/12/2020   Coronary artery disease involving native coronary artery of native heart without angina pectoris 09/07/2014   Renal insufficiency 12/02/2012   Preventative health care 11/13/2010   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 [place], [place], [address] Phone: [phone]   Fax:  [phone]  Name: Trevor Anderson MRN: [id number] Date of Birth: Apr 27, 1955

## 2021-04-07 ENCOUNTER — Encounter: Payer: Self-pay | Admitting: Physical Therapy

## 2021-04-07 ENCOUNTER — Encounter: Payer: Self-pay | Admitting: Occupational Therapy

## 2021-04-07 ENCOUNTER — Ambulatory Visit (INDEPENDENT_AMBULATORY_CARE_PROVIDER_SITE_OTHER): Payer: No Typology Code available for payment source | Admitting: Neurology

## 2021-04-07 ENCOUNTER — Other Ambulatory Visit: Payer: Self-pay

## 2021-04-07 ENCOUNTER — Ambulatory Visit: Payer: No Typology Code available for payment source | Admitting: Physical Therapy

## 2021-04-07 ENCOUNTER — Encounter: Payer: Self-pay | Admitting: Neurology

## 2021-04-07 ENCOUNTER — Ambulatory Visit: Payer: No Typology Code available for payment source | Admitting: Occupational Therapy

## 2021-04-07 VITALS — BP 137/88 | HR 87 | Ht 71.0 in | Wt 237.7 lb

## 2021-04-07 DIAGNOSIS — I69359 Hemiplegia and hemiparesis following cerebral infarction affecting unspecified side: Secondary | ICD-10-CM

## 2021-04-07 DIAGNOSIS — I69351 Hemiplegia and hemiparesis following cerebral infarction affecting right dominant side: Secondary | ICD-10-CM | POA: Diagnosis not present

## 2021-04-07 DIAGNOSIS — G8113 Spastic hemiplegia affecting right nondominant side: Secondary | ICD-10-CM

## 2021-04-07 DIAGNOSIS — G464 Cerebellar stroke syndrome: Secondary | ICD-10-CM | POA: Insufficient documentation

## 2021-04-07 DIAGNOSIS — F32A Depression, unspecified: Secondary | ICD-10-CM | POA: Insufficient documentation

## 2021-04-07 DIAGNOSIS — H6123 Impacted cerumen, bilateral: Secondary | ICD-10-CM | POA: Insufficient documentation

## 2021-04-07 DIAGNOSIS — H9193 Unspecified hearing loss, bilateral: Secondary | ICD-10-CM | POA: Insufficient documentation

## 2021-04-07 DIAGNOSIS — R278 Other lack of coordination: Secondary | ICD-10-CM

## 2021-04-07 DIAGNOSIS — M6281 Muscle weakness (generalized): Secondary | ICD-10-CM

## 2021-04-07 DIAGNOSIS — R2681 Unsteadiness on feet: Secondary | ICD-10-CM

## 2021-04-07 DIAGNOSIS — M25621 Stiffness of right elbow, not elsewhere classified: Secondary | ICD-10-CM

## 2021-04-07 DIAGNOSIS — R41840 Attention and concentration deficit: Secondary | ICD-10-CM

## 2021-04-07 DIAGNOSIS — M25611 Stiffness of right shoulder, not elsewhere classified: Secondary | ICD-10-CM

## 2021-04-07 NOTE — Progress Notes (Signed)
[facility] [address] [place]. [place] [address] [id number]       OFFICE CONSULT NOTE  Trevor Anderson Date of Birth:  [birth year] Medical Record Number:  [id number]   Referring MD: Lucile Crater, NP  Reason for Referral: Stroke  HPI: Trevor Anderson is a 65 year old African-American male seen today for initial office consultation visit.  He is accompanied by his son.  History is obtained from them and review of referral notes and electronic medical records.  I have available imaging results and PACS but not actual films for personal review today.  Patient has past medical history of hypertension, hyperlipidemia, diabetes, congestive heart failure, cardiomyopathy and stroke.  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  MRI scan report from that time shows a large 3.4 cm left basal ganglia as well as smaller right basal ganglia and left paramedian pontine lacunar infarcts.  CT angiogram showed no large vessel occlusion but showed moderate bilateral M2, left P2 and ACA stenosis.  2D echo showed ejection fraction of 45 to 50%.  TEE showed a small PFO which is felt to be incidental.  Lower extremity venous Dopplers were negative.  LDL cholesterol was elevated at 173 mg percent and hemoglobin A1c at 6.8.  Patient had a loop recorder inserted and so far paroxysmal A. fib has not been found.  Patient unfortunately has not done well and continues to have significant spastic right hemiplegia and unable to even bend his fingers or toes or any movement.  He is barely able to stand even with 1 person assist and is unable to walk.  He still currently doing outpatient physical and occupational therapy.  His speech and swallowing have improved and he can eat all right speaks clearly without dysarthria.  [facility] clinic but he has not yet gotten appointment.  Patient is tolerating aspirin and  Plavix with minor bruising and no bleeding.  His blood pressure is under good control today it is 137/88.  He is tolerating Lipitor well without muscle aches and pains.  He has not had any follow-up lipid profile checked recently or vascular studies done for follow-up.  He has no new complaints.  ROS:   14 system review of systems is positive for weakness, spasticity, difficulty walking and difficulty standing and all other systems negative  PMH:  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   CONGESTIVE HEART FAILURE 11/12/2007   DIABETES MELLITUS, TYPE II 11/20/2008   HYPERLIPIDEMIA 11/12/2007   HYPERTENSION 11/12/2007   TACHYCARDIA 01/24/2010    Social History:  Social History   Socioeconomic History   Marital status: Single    Spouse name: Not on file   Number of children: Not on file   Years of education: Not on file   Highest education level: Not on file  Occupational History   Not on file  Tobacco Use   Smoking status: Never   Smokeless tobacco: Never  Substance and Sexual Activity   Alcohol use: Yes    Comment: social   Drug use: No   Sexual activity: Not on file  Other Topics Concern   Not on file  Social History Narrative   Not on file   Social Determinants of Health   Financial Resource Strain: Not on file  Food Insecurity: Not on file  Transportation Needs: Not on file  Physical Activity: Not on file  Stress:  Not on file  Social Connections: Not on file  Intimate Partner Violence: Not on file    Medications:   Current Outpatient Medications on File Prior to Visit  Medication Sig Dispense Refill   amLODipine (NORVASC) 10 MG tablet Take 1 tablet (10 mg total) by mouth daily. 90 tablet 0   aspirin 81 MG EC tablet Take 81 mg by mouth daily. Swallow whole.     atorvastatin (LIPITOR) 80 MG tablet Take 1 tablet (80 mg total) by mouth daily. 90 tablet 0   baclofen (LIORESAL) 10 MG tablet Take 10 mg by mouth 3 (three) times daily.      carvedilol (COREG) 12.5 MG tablet Take 3 tablets (37.5 mg total) by mouth 2 (two) times daily with a meal. 540 tablet 0   Cholecalciferol 50 MCG (2000 UT) TABS Take 1 tablet by mouth daily.     furosemide (LASIX) 40 MG tablet Take 1 tablet (40 mg total) by mouth daily. 90 tablet 0   Lancets MISC 1 application by Does not apply route daily. 100 each 11   omeprazole (PRILOSEC) 20 MG capsule Take 20 mg by mouth daily.     potassium chloride SA (KLOR-CON M20) 20 MEQ tablet Take 1 tablet (20 mEq total) by mouth daily. 90 tablet 0   QUEtiapine (SEROQUEL) 25 MG tablet Take 0.5 tablets (12.5 mg total) by mouth at bedtime. 45 tablet 0   senna (SENOKOT) 8.6 MG tablet Take 1 tablet by mouth 2 (two) times daily.     sertraline (ZOLOFT) 100 MG tablet Take 1 tablet by mouth daily.     UNABLE TO FIND Take 1 tablet by mouth daily. Med Name: ciprodegel bisulfate 75 mg     No current facility-administered medications on file prior to visit.    Allergies:  No Known Allergies  Physical Exam General: Obese middle-aged African-American male, seated, in no evident distress Head: head normocephalic and atraumatic.   Neck: supple with no carotid or supraclavicular bruits Cardiovascular: regular rate and rhythm, no murmurs Musculoskeletal: no deformity Skin:  no rash/petichiae Vascular:  Normal pulses all extremities  Neurologic Exam Mental Status: Awake and fully alert. Oriented to place and time. Recent and remote memory intact. Attention span, concentration and fund of knowledge appropriate. Mood and affect appropriate.  Cranial Nerves: Fundoscopic exam reveals sharp disc margins. Pupils equal, briskly reactive to light. Extraocular movements full without nystagmus. Visual fields full to confrontation. Hearing intact. Facial sensation intact.  Moderate right lower facial weakness., tongue, palate moves normally and symmetrically.  Motor: Spastic dense right hemiplegia with 0/5 strength on the right side  with increased tone.  Normal strength on the left. Sensory.: intact to touch , pinprick , position and vibratory sensation.  Coordination: Rapid alternating movements normal in all extremities. Finger-to-nose and heel-to-shin performed accurately bilaterally. Gait and Station: Unable to test as patient is not able to walk even with assistance  reflexes: 2+ and asymmetric and brisker on the right. Toes downgoing.   NIHSS  10 Modified Rankin  4   ASSESSMENT: 65 year old African-American male with large left basal ganglia and small right basal ganglia and left pontine lacunar infarcts in November 2021 with significant residual spastic right hemiplegia.  Vascular risk factors of hypertension, hyperlipidemia, cardiomyopathy, congestive heart failure obesity and intracranial atherosclerosis     PLAN:I had a long d/w patient about his recent stroke, risk for recurrent stroke/TIAs, personally independently reviewed imaging studies and stroke evaluation results and answered questions.Continue aspirin 81 mg daily  for  secondary stroke prevention and stop Plavix now and maintain strict control of hypertension with blood pressure goal below 130/90, diabetes with hemoglobin A1c goal below 6.5% and lipids with LDL cholesterol goal below 70 mg/dL. I also advised the patient to eat a healthy diet with plenty of whole grains, cereals, fruits and vegetables, exercise regularly and maintain ideal body weight .check follow-up screening lipid profile, hemoglobin A1c, carotid ultrasound and transcranial Doppler studies.  Continue ongoing physical occupational therapy.  Continue appointment at pain management clinic for treatment for spasticity.  Followup in the future with my nurse practitioner in 3 months or call earlier if necessary.  Greater than 50% time during this 45-minute consultation visit was spent in counseling and coordination of care about his strokes and spastic diplegia and discussion about stroke prevention  and treatment and answering questions.  Antony Contras, MD Note: This document was prepared with digital dictation and possible smart phrase technology. Any transcriptional errors that result from this process are unintentional.

## 2021-04-07 NOTE — Therapy (Signed)
[facility] [address] [place], [place], [address] Phone: [phone]   Fax:  [phone]  Occupational Therapy Treatment  Patient Details  Name: Trevor Anderson MRN: [id number] Date of Birth: May 10, 1955 Referring Provider (OT): Deland Pretty, NP   Encounter Date: 04/07/2021   OT End of Session - 04/07/21 1112     Visit Number 21    Number of Visits 30    Date for OT Re-Evaluation 05/31/21    Authorization Type [facility]    Authorization Time Period 30 visits approved thru 05/12/2021 (04/07/21  [id number]   15 visits   Expires 07/28/21   )    Authorization - Visit Number 20    Authorization - Number of Visits 30    Progress Note Due on Visit 30    OT Start Time 1100    OT Stop Time 1145    OT Time Calculation (min) 45 min    Activity Tolerance Patient tolerated treatment well    Behavior During Therapy [facility] for tasks assessed/performed             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   CONGESTIVE HEART FAILURE 11/12/2007   DIABETES MELLITUS, TYPE II 11/20/2008   HYPERLIPIDEMIA 11/12/2007   HYPERTENSION 11/12/2007   TACHYCARDIA 01/24/2010    Past Surgical History:  Procedure Laterality Date   BUBBLE STUDY  03/16/2020   Procedure: BUBBLE STUDY;  Surgeon: Buford Dresser, MD;  Location: [facility];  Service: Cardiovascular;;   LOOP RECORDER INSERTION N/A 03/23/2020   Procedure: LOOP RECORDER INSERTION;  Surgeon: Thompson Grayer, MD;  Location: [facility] CV LAB;  Service: Cardiovascular;  Laterality: N/A;   TEE WITHOUT CARDIOVERSION N/A 03/16/2020   Procedure: TRANSESOPHAGEAL ECHOCARDIOGRAM (TEE);  Surgeon: Buford Dresser, MD;  Location: [facility] ENDOSCOPY;  Service: Cardiovascular;  Laterality: N/A;   TONSILLECTOMY  1970    There were no vitals filed for this visit.   Subjective Assessment - 04/07/21 1105     Subjective  moving my arm and leg a little bit     Pertinent History HLD, HTN, DM, CHF    Limitations Fall Risk. Max/Total stand pivot transfer w RW. Loop Recorder (no ESTIM)    Patient Stated Goals to move it (RUE)    Currently in Pain? No/denies    Pain Score 0-No pain             Splint Modifications for increased digit extension on RUE.  Standing x 2 from tilt in space with RW with mod A for sit to stand. Pt was able to maintain standing balance with LUE on RW for support for approximately 45 - 60 seconds. Pt and pt's son were educated on void scheduling for assisting for incontinence. Pt is aware of needing to go to the bathroom but is not consistent with alerting or informing caregivers. Pt with much improved ability to stand for assisting with clothing management and hygiene.   Manual Therapy PROM to RUE at all joints                       OT Short Term Goals - 04/05/21 1635       OT SHORT TERM GOAL #1   Title Pt and caregivers will be independent with HEP    Time 4    Period Weeks    Status Achieved    Target Date 02/09/21      OT SHORT TERM GOAL #2  Title Pt will complete UB dressing with min A for pull over short sleeve shirt.    Baseline mod A    Time 4    Period Weeks    Status Achieved   per pt report - completing with min A for hemi arm.     OT SHORT TERM GOAL #3   Title Pt and caregiver will verbalize understanding of adapted strategies and/or equipment for completing toilet and tub transfers with mod A consistently.    Baseline currently mod/max and not getting to shower.    Time 4    Period Weeks    Status Not Met   max A for transfers and unable to fully get to tub bench and shower safely at this time and not getting on [facility] consistently at home. still using bed pan. 03/08/21     OT SHORT TERM GOAL #4   Title Pt will achieve 90* of PROM for RUE shoulder flexion to reduce risk of malpositioning and increased pain.    Time 4    Period Weeks    Status Achieved   90* with RUE shoulder  flexion 03/08/21     OT SHORT TERM GOAL #5   Title Pt and caregivers will verbalize and demonstrate understanding of wear and care of any splints and/or orthoses PRN.    Time 4    Period Weeks    Status Achieved               OT Long Term Goals - 03/23/21 1710       OT LONG TERM GOAL #1   Title Pt and caregivers will be independent with and updated HEPs    Time 10    Period Weeks    Status On-going      OT LONG TERM GOAL #2   Title Pt will complete toilet and tub transfers consistently with min assistance with adapted strategies and equipment PRN    Baseline mod A for stand pivot    Time 10    Period Weeks    Status Deferred   Pt consistently mod - max transfer. 03/08/21     OT LONG TERM GOAL #3   Title Pt will complete bathing with min A    Baseline mod-max    Time 10    Period Weeks    Status On-going   simulated min A in the clinic 03/08/21 - pt able to reach 75% with washcloth while seated in tilt in space chair.     OT LONG TERM GOAL #4   Title Pt will demonstrate composite flexion/extension of 15% or greater in RUE for preparing for active grasp/release and increasing functional use.    Baseline trace extension    Time 10    Period Weeks    Status On-going   trace extension/approx 10% flexion 03/08/21     OT LONG TERM GOAL #5   Title Pt will achieve -20 degrees of elbow extension in RUE of PROM or decreasing risk of skin breakdown    Baseline -40*    Time 10    Period Weeks    Status On-going   -25* 03/08/21     OT LONG TERM GOAL #6   Title Pt and son will verbalize understanding of home modifications/equipment and/or adapted strategies for increasing independence with transfers for bathing and toileting and increasing safety and independence with ADLs/IADLs. (BSC, sponge baths vs tub bench, long handled sponge, cutting food, etc)    Time 12  Period Weeks    Status New                   Plan - 04/07/21 1256     Clinical Impression  Statement Pt with much improved ability to stand with RW in order to increase for toileting with clothing management and hygiene.    OT Occupational Profile and History Problem Focused Assessment - Including review of records relating to presenting problem    Occupational performance deficits (Please refer to evaluation for details): IADL's;ADL's;Leisure    Body Structure / Function / Physical Skills Tone;Strength;ADL;FMC;Mobility;GMC;ROM;IADL;UE functional use;Decreased knowledge of use of DME    Cognitive Skills Problem Solve    Rehab Potential Good    Clinical Decision Making Limited treatment options, no task modification necessary    Comorbidities Affecting Occupational Performance: None    Modification or Assistance to Complete Evaluation  No modification of tasks or assist necessary to complete eval    OT Frequency 2x / week    OT Duration 12 weeks   16 visits after 11/15 pending [facility] auth/approval   OT Treatment/Interventions Self-care/ADL training;Manual Therapy;Patient/family education;Electrical Stimulation;Neuromuscular education;Functional Mobility Training;Passive range of motion;Cognitive remediation/compensation;Therapeutic exercise;Moist Heat;DME and/or AE instruction;Therapeutic activities;Aquatic Therapy;Splinting    Plan continue working towards RUE NMR, trunk and hip mobility for increasing transfer independence.    OT Home Exercise Plan self PROM    Consulted and Agree with Plan of Care Patient;Family member/caregiver    Family Member Consulted son, Jovian             Patient will benefit from skilled therapeutic intervention in order to improve the following deficits and impairments:   Body Structure / Function / Physical Skills: Tone, Strength, ADL, FMC, Mobility, GMC, ROM, IADL, UE functional use, Decreased knowledge of use of DME Cognitive Skills: Problem Solve     Visit Diagnosis: Hemiplegia and hemiparesis following cerebral infarction affecting right dominant  side (HCC)  Unsteadiness on feet  Muscle weakness (generalized)  Other lack of coordination  Attention and concentration deficit  Stiffness of right shoulder, not elsewhere classified  Stiffness of right elbow, not elsewhere classified    Problem List Patient Active Problem List   Diagnosis Date Noted   Depressive disorder 04/07/2021   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   Impacted cerumen, bilateral 04/07/2021   Unspecified hearing loss, bilateral 04/07/2021   Hemiparesis as late effect of cerebrovascular accident (CVA) ([place]) 04/02/2020   Cerebrovascular accident (CVA) ([place])    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   Hypertensive urgency 03/12/2020   Acute on chronic systolic CHF (congestive heart failure) ([place]) 03/12/2020   AKI (acute kidney injury) ([place]) 03/12/2020   Fall at home, initial encounter 03/12/2020   Right hip pain 03/12/2020   Hypokalemia 03/12/2020   Coronary artery disease involving native coronary artery of native heart without angina pectoris 09/07/2014   Renal insufficiency 12/02/2012   Preventative health care 11/13/2010   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 [place], [place], [address] Phone: [phone]   Fax:  [phone]  Name: Trevor Anderson MRN: [id number] Date of Birth: 12/14/1955

## 2021-04-07 NOTE — Patient Instructions (Signed)
I had a long d/w patient about his recent stroke, risk for recurrent stroke/TIAs, personally independently reviewed imaging studies and stroke evaluation results and answered questions.Continue aspirin 81 mg daily  for secondary stroke prevention and stop Plavix now and maintain strict control of hypertension with blood pressure goal below 130/90, diabetes with hemoglobin A1c goal below 6.5% and lipids with LDL cholesterol goal below 70 mg/dL. I also advised the patient to eat a healthy diet with plenty of whole grains, cereals, fruits and vegetables, exercise regularly and maintain ideal body weight .check follow-up screening lipid profile, hemoglobin A1c, carotid ultrasound and transcranial Doppler studies.  Continue ongoing physical occupational therapy.  Continue appointment at pain management clinic for treatment for spasticity.  Followup in the future with my nurse practitioner in 3 months or call earlier if necessary. Stroke Prevention Some medical conditions and behaviors can lead to a higher chance of having a stroke. You can help prevent a stroke by eating healthy, exercising, not smoking, and managing any medical conditions you have. Stroke is a leading cause of functional impairment. Primary prevention is particularly important because a majority of strokes are first-time events. Stroke changes the lives of not only those who experience a stroke but also their family and other caregivers. How can this condition affect me? A stroke is a medical emergency and should be treated right away. A stroke can lead to brain damage and can sometimes be life-threatening. If a person gets medical treatment right away, there is a better chance of surviving and recovering from a stroke. What can increase my risk? The following medical conditions may increase your risk of a stroke: Cardiovascular disease. High blood pressure (hypertension). Diabetes. High cholesterol. Sickle cell disease. Blood clotting  disorders (hypercoagulable state). Obesity. Sleep disorders (obstructive sleep apnea). Other risk factors include: Being older than age 52. Having a history of blood clots, stroke, or mini-stroke (transient ischemic attack, TIA). Genetic factors, such as race, ethnicity, or a family history of stroke. Smoking cigarettes or using other tobacco products. Taking birth control pills, especially if you also use tobacco. Heavy use of alcohol or drugs, especially cocaine and methamphetamine. Physical inactivity. What actions can I take to prevent this? Manage your health conditions High cholesterol levels. Eating a healthy diet is important for preventing high cholesterol. If cholesterol cannot be managed through diet alone, you may need to take medicines. Take any prescribed medicines to control your cholesterol as told by your health care provider. Hypertension. To reduce your risk of stroke, try to keep your blood pressure below 130/80. Eating a healthy diet and exercising regularly are important for controlling blood pressure. If these steps are not enough to manage your blood pressure, you may need to take medicines. Take any prescribed medicines to control hypertension as told by your health care provider. Ask your health care provider if you should monitor your blood pressure at home. Have your blood pressure checked every year, even if your blood pressure is normal. Blood pressure increases with age and some medical conditions. Diabetes. Eating a healthy diet and exercising regularly are important parts of managing your blood sugar (glucose). If your blood sugar cannot be managed through diet and exercise, you may need to take medicines. Take any prescribed medicines to control your diabetes as told by your health care provider. Get evaluated for obstructive sleep apnea. Talk to your health care provider about getting a sleep evaluation if you snore a lot or have excessive sleepiness. Make  sure that any other  medical conditions you have, such as atrial fibrillation or atherosclerosis, are managed. Nutrition Follow instructions from your health care provider about what to eat or drink to help manage your health condition. These instructions may include: Reducing your daily calorie intake. Limiting how much salt (sodium) you use to 1,500 milligrams (mg) each day. Using only healthy fats for cooking, such as olive oil, canola oil, or sunflower oil. Eating healthy foods. You can do this by: Choosing foods that are high in fiber, such as whole grains, and fresh fruits and vegetables. Eating at least 5 servings of fruits and vegetables a day. Try to fill one-half of your plate with fruits and vegetables at each meal. Choosing lean protein foods, such as lean cuts of meat, poultry without skin, fish, tofu, beans, and nuts. Eating low-fat dairy products. Avoiding foods that are high in sodium. This can help lower blood pressure. Avoiding foods that have saturated fat, trans fat, and cholesterol. This can help prevent high cholesterol. Avoiding processed and prepared foods. Counting your daily carbohydrate intake.  Lifestyle If you drink alcohol: Limit how much you have to: 0-1 drink a day for women who are not pregnant. 0-2 drinks a day for men. Know how much alcohol is in your drink. In the [place] one drink equals one 12 oz bottle of beer ( ), one 5 oz glass of wine ( ), or one 1 oz glass of hard liquor (53mL). Do not use any products that contain nicotine or tobacco. These products include cigarettes, chewing tobacco, and vaping devices, such as e-cigarettes. If you need help quitting, ask your health care provider. Avoid secondhand smoke. Do not use drugs. Activity  Try to stay at a healthy weight. Get at least 30 minutes of exercise on most days, such as: Fast walking. Biking. Swimming. Medicines Take over-the-counter and prescription medicines only as told by your  health care provider. Aspirin or blood thinners (antiplatelets or anticoagulants) may be recommended to reduce your risk of forming blood clots that can lead to stroke. Avoid taking birth control pills. Talk to your health care provider about the risks of taking birth control pills if: You are over 22 years old. You smoke. You get very bad headaches. You have had a blood clot. Where to find more information American Stroke Association: www.strokeassociation.org Get help right away if: You or a loved one has any symptoms of a stroke. "BE FAST" is an easy way to remember the main warning signs of a stroke: B - Balance. Signs are dizziness, sudden trouble walking, or loss of balance. E - Eyes. Signs are trouble seeing or a sudden change in vision. F - Face. Signs are sudden weakness or numbness of the face, or the face or eyelid drooping on one side. A - Arms. Signs are weakness or numbness in an arm. This happens suddenly and usually on one side of the body. S - Speech. Signs are sudden trouble speaking, slurred speech, or trouble understanding what people say. T - Time. Time to call emergency services. Write down what time symptoms started. You or a loved one has other signs of a stroke, such as: A sudden, severe headache with no known cause. Nausea or vomiting. Seizure. These symptoms may represent a serious problem that is an emergency. Do not wait to see if the symptoms will go away. Get medical help right away. Call your local emergency services (911 in the [place] Do not drive yourself to the hospital. Summary You can help to prevent a stroke  by eating healthy, exercising, not smoking, limiting alcohol intake, and managing any medical conditions you may have. Do not use any products that contain nicotine or tobacco. These include cigarettes, chewing tobacco, and vaping devices, such as e-cigarettes. If you need help quitting, ask your health care provider. Remember "BE FAST" for warning  signs of a stroke. Get help right away if you or a loved one has any of these signs. This information is not intended to replace advice given to you by your health care provider. Make sure you discuss any questions you have with your health care provider. Document Revised: 11/10/2019 Document Reviewed: 11/10/2019 Elsevier Patient Education  2022 [employer].

## 2021-04-07 NOTE — Therapy (Signed)
[facility] [address] [place], [place], [address] Phone: [phone]   Fax:  [phone]  Physical Therapy Treatment  Patient Details  Name: Trevor Anderson MRN: [id number] Date of Birth: Jun 04, 1955 Referring Provider (PT): Deland Pretty   Encounter Date: 04/07/2021   PT End of Session - 04/07/21 2231     Visit Number 28    Number of Visits 30    Date for PT Re-Evaluation 63/89/37   per recert on 34/28/76   Authorization Type [facility] auth# [id number] (15 visits); [facility] has approved additional 15 PT visits to 05/26/2021; have submitted request for more visits    Authorization - Visit Number 13    Authorization - Number of Visits 15    PT Start Time 9741    PT Stop Time 1100    PT Time Calculation (min) 45 min    Equipment Utilized During Treatment Gait belt   Blue Rocker AFO   Activity Tolerance Patient tolerated treatment well;Patient limited by fatigue    Behavior During Therapy [facility] for tasks assessed/performed             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   CONGESTIVE HEART FAILURE 11/12/2007   DIABETES MELLITUS, TYPE II 11/20/2008   HYPERLIPIDEMIA 11/12/2007   HYPERTENSION 11/12/2007   TACHYCARDIA 01/24/2010    Past Surgical History:  Procedure Laterality Date   BUBBLE STUDY  03/16/2020   Procedure: BUBBLE STUDY;  Surgeon: Buford Dresser, MD;  Location: [facility];  Service: Cardiovascular;;   LOOP RECORDER INSERTION N/A 03/23/2020   Procedure: LOOP RECORDER INSERTION;  Surgeon: Thompson Grayer, MD;  Location: [facility];  Service: Cardiovascular;  Laterality: N/A;   TEE WITHOUT CARDIOVERSION N/A 03/16/2020   Procedure: TRANSESOPHAGEAL ECHOCARDIOGRAM (TEE);  Surgeon: Buford Dresser, MD;  Location: [facility] ENDOSCOPY;  Service: Cardiovascular;  Laterality: N/A;   TONSILLECTOMY  1970    There were no vitals filed for this visit.   Subjective  Assessment - 04/07/21 1023     Subjective Saw Dr. Leonie Man just now. Taking off Plavix, did not add any meds.    Patient is accompained by: Family member   son   Limitations Standing    How long can you stand comfortably? 5-10 minutes    Patient Stated Goals Open/close hand; Get more R LE motion;    Currently in Pain? No/denies    Pain Score 0-No pain                    [facility] Adult PT Treatment/Exercise - 04/07/21 1027       Transfers   Transfers Sit to Stand;Stand to Sit    Sit to Stand 3: Mod assist;With upper extremity assist;From bed;From chair/3-in-1    Sit to Stand Details Tactile cues for weight shifting;Tactile cues for placement;Verbal cues for technique;Verbal cues for sequencing    Sit to Stand Details (indicate cue type and reason) x1 rep from wheelchair, x3 reps from mat table and x2 reps from standard chair with armrests. cues each time to scoot closer ot edge and for hand placement. PTA using one foot to keep pt's right foot in place to prevent flexor tone reactions with standing.    Stand to Sit 3: Mod assist;With upper extremity assist;To bed;To chair/3-in-1    Stand to Sit Details (indicate cue type and reason) Verbal cues for sequencing;Verbal cues for technique    Stand to Sit Details reminder cues to reach back  for more controlled descent.      Ambulation/Gait   Ambulation/Gait Yes    Ambulation/Gait Assistance 3: Mod assist;4: Min assist   of 2 therapist   Ambulation/Gait Assistance Details Continued with use of blue rocker AFO with gait with cues/facilitation needed for upright posture, weight shifitng, to "power up" with right stance for hip/knee extension. assist needed for right LE advancement, however pt self advancing walker and left LE with gait.    Ambulation Distance (Feet) --   ~35 feet (from corner high low mat toward right into treatment room 1)   Assistive device Rolling walker;Other (Comment)   blue rocker AFO   Gait Pattern Step-to  pattern;Decreased stride length;Decreased step length - right;Decreased step length - left;Decreased stance time - right;Decreased hip/knee flexion - right;Decreased dorsiflexion - right;Decreased weight shift to right;Right flexed knee in stance;Decreased trunk rotation;Narrow base of support;Poor foot clearance - right    Ambulation Surface Level;Indoor      Therapeutic Activites    Therapeutic Activities Other Therapeutic Activities    Other Therapeutic Activities standing at [place] next to mat table: working on upright posture and weight shifting while in standing next to the mat table with min to mod assist, cues/facilitation for posture and weight shifting; then in treatment room after gait pt able to stand at RW x2 with min assist for self care tasks. Pt able to let go of walker with left UE to assist with clothing management in standing x 2.      Exercises   Exercises Other Exercises    Other Exercises  seated in wheelchair- passive stretching of heel cords, hamstrings and hip adductors on right LE prior to mobility.                   PT Short Term Goals - 04/05/21 1430       PT SHORT TERM GOAL #1   Title Pt will continue to perform HEP with caregiver assistance, for improved flexibility, strength, transfers.    Baseline reports completing 2-3x/week with caregiver assistance, performing standing intermittently at home with son    Time 4    Period Weeks    Status Partially Met    Target Date 04/05/21      PT SHORT TERM GOAL #2   Title Pt will perform sit to stand transfer with min/mod assist from mat table with RW.    Baseline needs mod A from w/c, and min/mod A from mat table.    Time 4    Period Weeks    Status Achieved      PT SHORT TERM GOAL #3   Title Pt will perform stand pivot transfers with RW from w/c <> mat table with mod A x1 in order to demo improved functional transfers.    Time 4    Period Weeks    Status New      PT SHORT TERM GOAL #4   Title Pt will  ambulate at least 20' with RW with min-mod A +2 in order to demo improved functional mobility.    Baseline 21' on 04/05/21 with mod A +2 and RW    Time 4    Period Weeks    Status Achieved               PT Long Term Goals - 03/04/21 1434       PT LONG TERM GOAL #1   Title Pt will be able to perform progression of/finalized HEP and perform standing consistently  at home with caregiver assist,  TARGET 05/03/21    Baseline pt's son reports working on exercises at home with caregiver - pt will benefit from ongoing revisions/additions, pt not performing standing at home with caregivers.    Time 8    Period Weeks    Status Revised    Target Date 05/03/21      PT LONG TERM GOAL #2   Title Pt will be able to perform all edge of bed scooting with min A for improved ease of transfers, decreased caregiver burden    Baseline 03/04/2021: pt requires cues and min assist to laterally scoot to L, and mod assist and cues to laterally scoot to R    Time 8    Period Weeks    Status On-going      PT LONG TERM GOAL #3   Title Caregiver will report standing at sink or RW at home, for at least 5 minutes, at least 5 days per week, with min guard>supervision, for improved standing tolerance for ADL participation.    Baseline 03/04/2021: pt's son reports not much standing at home    Time 8    Period Weeks    Status On-going      PT LONG TERM GOAL #4   Title Pt will be able to perform wheelchair transfer standpivot with min A for improved independence vs. sliding board transfer with min guard, in order to decrease caregiver burden    Baseline pt needing mod A to perform transfer    Time 8    Period Weeks    Status On-going      PT LONG TERM GOAL #5   Title Pt will ambulate at least 30' with RW with min A +2 in order to demo improved functional mobility.    Baseline 5' of mod A +2    Time 8    Period Weeks    Status New      Additional Long Term Goals   Additional Long Term Goals Yes      PT  LONG TERM GOAL #6   Title Pt will perform sit <> stands with RW and min A in order to demo improved functional transfers to decr caregiver burden.    Time 8    Period Weeks    Status New                   Plan - 04/07/21 2232     Clinical Impression Statement Today's skilled session contineud to focus on LE stretching, transfers, standing balance and gait with RW. Pt with an accident with session needing assist for personal needs. Pt able to stand with RW and min assist for several minutes to address these needs. Less assistance needed with gait as well this session. The pt is making progress toward goals and should benefit from continued PT to progress toward unmet goals.    Personal Factors and Comorbidities Age;Time since onset of injury/illness/exacerbation    Examination-Activity Limitations Locomotion Level;Transfers;Sit;Stand;Toileting;Dressing;Hygiene/Grooming;Self Feeding;Bed Mobility;Bend;Bathing    Examination-Participation Restrictions Community Activity;Shop;Meal Prep;Yard Work    [employer] Evolving/Moderate complexity    Rehab Potential Fair    PT Frequency 2x / week    PT Duration 8 weeks    PT Treatment/Interventions ADLs/Self Care Home Management;Electrical Stimulation;DME Instruction;Gait training;Functional mobility training;Therapeutic activities;Therapeutic exercise;Balance training;Neuromuscular re-education;Manual techniques;Patient/family education;Orthotic Fit/Training;Wheelchair mobility training;Passive range of motion;Dry needling;Taping    PT Next Visit Plan any update from [place] with more visits? Standing tolerance in // bars  or edge of mat from elevated surface. Continue to work on LE strengthening, stand pivot transfers with RW vs. squat pivot transfers, pre gait activities. LE stretching to assist with tone/spasticity management prior to any standing or transfers. continue to work on gait with 2 person assit/Bluerocker AFO.    PT  Home Exercise Plan Forward lean x20 with caregiver; standing 3x daily for at least 30 sec with caregiver; [place] 12/29/20    Consulted and Agree with Plan of Care Patient             Patient will benefit from skilled therapeutic intervention in order to improve the following deficits and impairments:  Abnormal gait, Difficulty walking, Impaired tone, Decreased range of motion, Decreased coordination, Decreased endurance, Impaired UE functional use, Decreased activity tolerance, Pain, Decreased balance, Decreased mobility, Decreased strength, Impaired sensation, Postural dysfunction  Visit Diagnosis: Hemiplegia and hemiparesis following cerebral infarction affecting right dominant side (HCC)  Muscle weakness (generalized)  Unsteadiness on feet     Problem List Patient Active Problem List   Diagnosis Date Noted   Depressive disorder 04/07/2021   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   Impacted cerumen, bilateral 04/07/2021   Unspecified hearing loss, bilateral 04/07/2021   Hemiparesis as late effect of cerebrovascular accident (CVA) ([place]) 04/02/2020   Cerebrovascular accident (CVA) ([place])    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   Hypertensive urgency 03/12/2020   Acute on chronic systolic CHF (congestive heart failure) ([place]) 03/12/2020   AKI (acute kidney injury) ([place]) 03/12/2020   Fall at home, initial encounter 03/12/2020   Right hip pain 03/12/2020   Hypokalemia 03/12/2020   Coronary artery disease involving native coronary artery of native heart without angina pectoris 09/07/2014   Renal insufficiency 12/02/2012   Preventative health care 11/13/2010   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

## 2021-04-08 LAB — LIPID PANEL
Chol/HDL Ratio: 3.4 ratio (ref 0.0–5.0)
Cholesterol, Total: 131 mg/dL (ref 100–199)
HDL: 38 mg/dL — ABNORMAL LOW (ref >39)
LDL Chol Calc (NIH): 78 mg/dL (ref 0–99)
Triglycerides: 76 mg/dL (ref 0–149)
VLDL Cholesterol Cal: 15 mg/dL (ref 5–40)

## 2021-04-08 LAB — HEMOGLOBIN A1C
Est. average glucose Bld gHb Est-mCnc: 134 mg/dL
Hgb A1c MFr Bld: 6.3 % — ABNORMAL HIGH (ref 4.8–5.6)

## 2021-04-12 ENCOUNTER — Other Ambulatory Visit: Payer: Self-pay

## 2021-04-12 ENCOUNTER — Encounter: Payer: Self-pay | Admitting: Physical Therapy

## 2021-04-12 ENCOUNTER — Encounter: Payer: Self-pay | Admitting: Occupational Therapy

## 2021-04-12 ENCOUNTER — Ambulatory Visit: Payer: No Typology Code available for payment source | Admitting: Occupational Therapy

## 2021-04-12 ENCOUNTER — Ambulatory Visit: Payer: No Typology Code available for payment source | Admitting: Physical Therapy

## 2021-04-12 DIAGNOSIS — I69351 Hemiplegia and hemiparesis following cerebral infarction affecting right dominant side: Secondary | ICD-10-CM

## 2021-04-12 DIAGNOSIS — M6281 Muscle weakness (generalized): Secondary | ICD-10-CM

## 2021-04-12 DIAGNOSIS — R278 Other lack of coordination: Secondary | ICD-10-CM

## 2021-04-12 DIAGNOSIS — M25611 Stiffness of right shoulder, not elsewhere classified: Secondary | ICD-10-CM

## 2021-04-12 DIAGNOSIS — M25621 Stiffness of right elbow, not elsewhere classified: Secondary | ICD-10-CM

## 2021-04-12 DIAGNOSIS — R41840 Attention and concentration deficit: Secondary | ICD-10-CM

## 2021-04-12 DIAGNOSIS — R2681 Unsteadiness on feet: Secondary | ICD-10-CM

## 2021-04-12 DIAGNOSIS — R293 Abnormal posture: Secondary | ICD-10-CM

## 2021-04-12 NOTE — Therapy (Signed)
[facility] [address] [place], [place], [address] Phone: [phone]   Fax:  [phone]  Occupational Therapy Treatment  Patient Details  Name: Trevor Anderson MRN: [id number] Date of Birth: 02-Jan-1956 Referring Provider (OT): Deland Pretty, NP   Encounter Date: 04/12/2021   OT End of Session - 04/12/21 1404     Visit Number 22    Number of Visits 30    Date for OT Re-Evaluation 05/31/21    Authorization Type VA    Authorization - Visit Number 21    Authorization - Number of Visits 30    Progress Note Due on Visit 30    OT Start Time 1315    OT Stop Time 0623    OT Time Calculation (min) 40 min    Activity Tolerance Patient tolerated treatment well    Behavior During Therapy [facility] for tasks assessed/performed             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   CONGESTIVE HEART FAILURE 11/12/2007   DIABETES MELLITUS, TYPE II 11/20/2008   HYPERLIPIDEMIA 11/12/2007   HYPERTENSION 11/12/2007   TACHYCARDIA 01/24/2010    Past Surgical History:  Procedure Laterality Date   BUBBLE STUDY  03/16/2020   Procedure: BUBBLE STUDY;  Surgeon: Buford Dresser, MD;  Location: [facility];  Service: Cardiovascular;;   LOOP RECORDER INSERTION N/A 03/23/2020   Procedure: LOOP RECORDER INSERTION;  Surgeon: Thompson Grayer, MD;  Location: [place] [facility];  Service: Cardiovascular;  Laterality: N/A;   TEE WITHOUT CARDIOVERSION N/A 03/16/2020   Procedure: TRANSESOPHAGEAL ECHOCARDIOGRAM (TEE);  Surgeon: Buford Dresser, MD;  Location: [facility] ENDOSCOPY;  Service: Cardiovascular;  Laterality: N/A;   TONSILLECTOMY  1970    There were no vitals filed for this visit.   Subjective Assessment - 04/12/21 1320     Subjective  My team won.  ([employer])  Patient reports he walked 35 feet with PT    Pertinent History HLD, HTN, DM, CHF    Limitations Fall Risk. Max/Total stand pivot  transfer w RW. Loop Recorder (no ESTIM)    Patient Stated Goals to move it (RUE)    Currently in Pain? No/denies    Pain Score 0-No pain                          OT Treatments/Exercises (OP) - 04/12/21 1357       Neurological Re-education Exercises   Other Exercises 1 Neuromuscular reeducation to address postural control.  Continuing to work on improved trunk motion over stable LE's in sitting.  Patient with significant hip restriction R>L - but is showing improved ability to flex self forward to [facility] for sit to stand.  Seated on bal to provide mobile surface to encourage more lower trunk initiated movement.    Other Exercises 2 Transferring level surface to right/left with min assist for forward weight shift and pivot.                      OT Short Term Goals - 04/12/21 1408       OT SHORT TERM GOAL #1   Title Pt and caregivers will be independent with HEP    Time 4    Period Weeks    Status Achieved    Target Date 02/09/21      OT SHORT TERM GOAL #2   Title Pt will complete UB dressing with min  A for pull over short sleeve shirt.    Baseline mod A    Time 4    Period Weeks    Status Achieved   per pt report - completing with min A for hemi arm.     OT SHORT TERM GOAL #3   Title Pt and caregiver will verbalize understanding of adapted strategies and/or equipment for completing toilet and tub transfers with mod A consistently.    Baseline currently mod/max and not getting to shower.    Time 4    Period Weeks    Status Not Met   max A for transfers and unable to fully get to tub bench and shower safely at this time and not getting on [facility] consistently at home. still using bed pan. 03/08/21     OT SHORT TERM GOAL #4   Title Pt will achieve 90* of PROM for RUE shoulder flexion to reduce risk of malpositioning and increased pain.    Time 4    Period Weeks    Status Achieved   90* with RUE shoulder flexion 03/08/21     OT SHORT TERM GOAL #5    Title Pt and caregivers will verbalize and demonstrate understanding of wear and care of any splints and/or orthoses PRN.    Time 4    Period Weeks    Status Achieved               OT Long Term Goals - 04/12/21 1409       OT LONG TERM GOAL #1   Title Pt and caregivers will be independent with and updated HEPs    Time 10    Period Weeks    Status On-going      OT LONG TERM GOAL #2   Title Pt will complete toilet and tub transfers consistently with min assistance with adapted strategies and equipment PRN    Baseline mod A for stand pivot    Time 10    Period Weeks    Status Deferred   Pt consistently mod - max transfer. 03/08/21     OT LONG TERM GOAL #3   Title Pt will complete bathing with min A    Baseline mod-max    Time 10    Period Weeks    Status On-going   simulated min A in the clinic 03/08/21 - pt able to reach 75% with washcloth while seated in tilt in space chair.     OT LONG TERM GOAL #4   Title Pt will demonstrate composite flexion/extension of 15% or greater in RUE for preparing for active grasp/release and increasing functional use.    Baseline trace extension    Time 10    Period Weeks    Status On-going   trace extension/approx 10% flexion 03/08/21     OT LONG TERM GOAL #5   Title Pt will achieve -20 degrees of elbow extension in RUE of PROM or decreasing risk of skin breakdown    Baseline -40*    Time 10    Period Weeks    Status On-going   -25* 03/08/21     OT LONG TERM GOAL #6   Title Pt and son will verbalize understanding of home modifications/equipment and/or adapted strategies for increasing independence with transfers for bathing and toileting and increasing safety and independence with ADLs/IADLs. (BSC, sponge baths vs tub bench, long handled sponge, cutting food, etc)    Time 12    Period Weeks    Status New  Plan - 04/12/21 1408     Clinical Impression Statement Pt continues to show improvements in  functional mobility despite extreme tension in right limbs.    OT Occupational Profile and History Problem Focused Assessment - Including review of records relating to presenting problem    Occupational performance deficits (Please refer to evaluation for details): IADL's;ADL's;Leisure    Body Structure / Function / Physical Skills Tone;Strength;ADL;FMC;Mobility;GMC;ROM;IADL;UE functional use;Decreased knowledge of use of DME    Cognitive Skills Problem Solve    Rehab Potential Good    Clinical Decision Making Limited treatment options, no task modification necessary    Comorbidities Affecting Occupational Performance: None    Modification or Assistance to Complete Evaluation  No modification of tasks or assist necessary to complete eval    OT Frequency 2x / week    OT Duration 12 weeks   16 visits after 11/15 pending VA auth/approval   OT Treatment/Interventions Self-care/ADL training;Manual Therapy;Patient/family education;Electrical Stimulation;Neuromuscular education;Functional Mobility Training;Passive range of motion;Cognitive remediation/compensation;Therapeutic exercise;Moist Heat;DME and/or AE instruction;Therapeutic activities;Aquatic Therapy;Splinting    Plan continue working towards RUE NMR, trunk and hip mobility for increasing transfer independence.    OT Home Exercise Plan self PROM    Consulted and Agree with Plan of Care Patient;Family member/caregiver    Family Member Consulted son, Jawara             Patient will benefit from skilled therapeutic intervention in order to improve the following deficits and impairments:   Body Structure / Function / Physical Skills: Tone, Strength, ADL, FMC, Mobility, GMC, ROM, IADL, UE functional use, Decreased knowledge of use of DME Cognitive Skills: Problem Solve     Visit Diagnosis: Abnormal posture  Muscle weakness (generalized)  Hemiplegia and hemiparesis following cerebral infarction affecting right dominant side  (HCC)  Other lack of coordination  Attention and concentration deficit  Stiffness of right shoulder, not elsewhere classified  Stiffness of right elbow, not elsewhere classified  Unsteadiness on feet    Problem List Patient Active Problem List   Diagnosis Date Noted   Depressive disorder 04/07/2021   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   Impacted cerumen, bilateral 04/07/2021   Unspecified hearing loss, bilateral 04/07/2021   Hemiparesis as late effect of cerebrovascular accident (CVA) ([place]) 04/02/2020   Cerebrovascular accident (CVA) ([place])    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   Hypertensive urgency 03/12/2020   Acute on chronic systolic CHF (congestive heart failure) ([place]) 03/12/2020   AKI (acute kidney injury) ([place]) 03/12/2020   Fall at home, initial encounter 03/12/2020   Right hip pain 03/12/2020   Hypokalemia 03/12/2020   Coronary artery disease involving native coronary artery of native heart without angina pectoris 09/07/2014   Renal insufficiency 12/02/2012   Preventative health care 11/13/2010   Secondary cardiomyopathy ([place]) 01/24/2010   TACHYCARDIA 01/24/2010   Type 2 diabetes mellitus without complication, with long-term current use of insulin ([place]) 11/20/2008   Mixed hyperlipidemia 11/12/2007   Essential hypertension 11/12/2007   CONGESTIVE HEART FAILURE 11/12/2007    Mariah Milling, OT 04/12/2021, 2:10 PM  [facility] [address] [place], [place], [address] Phone: [phone]   Fax:  [phone]  Name: USIEL ASTARITA MRN: [id number] Date of Birth: 1955-06-10

## 2021-04-12 NOTE — Therapy (Signed)
[facility] [address] [place], [place], [address] Phone: [phone]   Fax:  [phone]  Physical Therapy Treatment  Patient Details  Name: Trevor Anderson MRN: [id number] Date of Birth: 04-Mar-1956 Referring Provider (PT): Deland Pretty   Encounter Date: 04/12/2021   PT End of Session - 04/12/21 1236     Visit Number 29    Number of Visits 30    Date for PT Re-Evaluation 01/29/11   per recert on 19/75/88   Authorization Type VA auth# [id number] (15 visits); VA has approved additional 15 PT visits to 05/26/2021; have submitted request for more visits    Authorization - Visit Number 14    Authorization - Number of Visits 15    PT Start Time 3094    PT Stop Time 1315    PT Time Calculation (min) 42 min    Equipment Utilized During Treatment Gait belt   Blue Rocker AFO   Activity Tolerance Patient tolerated treatment well    Behavior During Therapy [facility] for tasks assessed/performed             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   CONGESTIVE HEART FAILURE 11/12/2007   DIABETES MELLITUS, TYPE II 11/20/2008   HYPERLIPIDEMIA 11/12/2007   HYPERTENSION 11/12/2007   TACHYCARDIA 01/24/2010    Past Surgical History:  Procedure Laterality Date   BUBBLE STUDY  03/16/2020   Procedure: BUBBLE STUDY;  Surgeon: Buford Dresser, MD;  Location: [place];  Service: Cardiovascular;;   LOOP RECORDER INSERTION N/A 03/23/2020   Procedure: LOOP RECORDER INSERTION;  Surgeon: Thompson Grayer, MD;  Location: [place] [facility];  Service: Cardiovascular;  Laterality: N/A;   TEE WITHOUT CARDIOVERSION N/A 03/16/2020   Procedure: TRANSESOPHAGEAL ECHOCARDIOGRAM (TEE);  Surgeon: Buford Dresser, MD;  Location: [facility] ENDOSCOPY;  Service: Cardiovascular;  Laterality: N/A;   TONSILLECTOMY  1970    There were no vitals filed for this visit.   Subjective Assessment - 04/12/21 1235      Subjective Things are going pretty good. No falls.    Patient is accompained by: Family member   son   Limitations Standing    How long can you stand comfortably? 5-10 minutes    Patient Stated Goals Open/close hand; Get more R LE motion;    Currently in Pain? No/denies                               [facility] Adult PT Treatment/Exercise - 04/12/21 1308       Transfers   Transfers Sit to Stand;Stand to Sit    Sit to Stand 3: Mod assist;With upper extremity assist;From bed;From chair/3-in-1    Sit to Stand Details Tactile cues for weight shifting;Tactile cues for placement;Verbal cues for technique;Verbal cues for sequencing    Sit to Stand Details (indicate cue type and reason) x3 reps from elevated mat table with min A x1 - 2 therapists. Therapist placing foot between pt's legs to help with incr flexor tone and adduction of RLE when initially standing. When standing does need help from therapist for proper RUE placement on RW. x2 reps from pt's w/c. Cues first to scoot out towards edge, and for incr forward lean (counting to 3 before coming to stand).    Stand to Sit 3: Mod assist;With upper extremity assist;To bed;To chair/3-in-1;4: Min assist    Stand to Sit Details (indicate cue type and reason)  Verbal cues for sequencing;Verbal cues for technique    Stand to Sit Details Cues to reach back to w/c, mat table with therapist helping to tilt back RW due to pt keeping RUE on RW. Needing min A back to elevated mat table and mod A to w/c after gait for controlled descent.    Lateral/Scoot Transfers 4: Min assist;3: Mod Passenger transport manager Details (indicate cue type and reason) From w/c > mat table going to pt's L using slide board. Manual/verbal cues for incr forward lean and head hips relationship.    Comments Prior to sit <> stands performed x10 reps for forward lean seated at edge of mat table to prep for transfers and to help decr tightness in hips, needing  additional assist to incr forward lean. Reviewed importance of performing this exercise at home as pt had not been performing. Standing at edge of mat with RW: 1st bout of standing cues for standing tall and for glute activation x45 seconds, 2nd bout of standing: for ~45 seconds - with weight shifting through hips to R to bump therapist x10 reps.     Ambulation/Gait   Ambulation/Gait Yes    Ambulation/Gait Assistance 3: Mod assist;4: Min assist   of 2 therapists, plus close w/c follow   Ambulation/Gait Assistance Details Continued with use of blue rocker AFO to RLE and used blue shoe cover as simulated toe cap for incr foot clearance. Cues/facilitation throughout for posture, weight shifting, and bringing RLE through during swing phase and for proper placement. Cues throughout to stand tall through stance leg for knee and hip extension, weight shifting for incr step length. Pt able to self advance RW with gait.    Ambulation Distance (Feet) 17 Feet   x1   Assistive device Rolling walker;Other (Comment)   blue rocker R AFO   Gait Pattern Step-to pattern;Decreased stride length;Decreased step length - right;Decreased step length - left;Decreased stance time - right;Decreased hip/knee flexion - right;Decreased dorsiflexion - right;Decreased weight shift to right;Right flexed knee in stance;Decreased trunk rotation;Narrow base of support;Poor foot clearance - right    Ambulation Surface Level;Indoor                       PT Short Term Goals - 04/05/21 1430       PT SHORT TERM GOAL #1   Title Pt will continue to perform HEP with caregiver assistance, for improved flexibility, strength, transfers.    Baseline reports completing 2-3x/week with caregiver assistance, performing standing intermittently at home with son    Time 4    Period Weeks    Status Partially Met    Target Date 04/05/21      PT SHORT TERM GOAL #2   Title Pt will perform sit to stand transfer with min/mod assist from  mat table with RW.    Baseline needs mod A from w/c, and min/mod A from mat table.    Time 4    Period Weeks    Status Achieved      PT SHORT TERM GOAL #3   Title Pt will perform stand pivot transfers with RW from w/c <> mat table with mod A x1 in order to demo improved functional transfers.    Time 4    Period Weeks    Status New      PT SHORT TERM GOAL #4   Title Pt will ambulate at least 20' with RW with min-mod A +2 in order to  demo improved functional mobility.    Baseline 21' on 04/05/21 with mod A +2 and RW    Time 4    Period Weeks    Status Achieved               PT Long Term Goals - 03/04/21 1434       PT LONG TERM GOAL #1   Title Pt will be able to perform progression of/finalized HEP and perform standing consistently at home with caregiver assist,  TARGET 05/03/21    Baseline pt's son reports working on exercises at home with caregiver - pt will benefit from ongoing revisions/additions, pt not performing standing at home with caregivers.    Time 8    Period Weeks    Status Revised    Target Date 05/03/21      PT LONG TERM GOAL #2   Title Pt will be able to perform all edge of bed scooting with min A for improved ease of transfers, decreased caregiver burden    Baseline 03/04/2021: pt requires cues and min assist to laterally scoot to L, and mod assist and cues to laterally scoot to R    Time 8    Period Weeks    Status On-going      PT LONG TERM GOAL #3   Title Caregiver will report standing at sink or RW at home, for at least 5 minutes, at least 5 days per week, with min guard>supervision, for improved standing tolerance for ADL participation.    Baseline 03/04/2021: pt's son reports not much standing at home    Time 8    Period Weeks    Status On-going      PT LONG TERM GOAL #4   Title Pt will be able to perform wheelchair transfer standpivot with min A for improved independence vs. sliding board transfer with min guard, in order to decrease caregiver  burden    Baseline pt needing mod A to perform transfer    Time 8    Period Weeks    Status On-going      PT LONG TERM GOAL #5   Title Pt will ambulate at least 30' with RW with min A +2 in order to demo improved functional mobility.    Baseline 5' of mod A +2    Time 8    Period Weeks    Status New      Additional Long Term Goals   Additional Long Term Goals Yes      PT LONG TERM GOAL #6   Title Pt will perform sit <> stands with RW and min A in order to demo improved functional transfers to decr caregiver burden.    Time 8    Period Weeks    Status New                   Plan - 04/12/21 1438     Clinical Impression Statement Today's skilled session continued to focus on transfers, standing balance, and gait with RW. Pt continues to need x2 therapists with gait needing min/mod A for balance, weight shifting, and for RLE during swing phase as pt unable to advance leg on his own. Pt with incr difficulty with forward weight shifting with lateral scoot transfers with sit <> stands. Performed x10 reps of forward leaning prior to sit > stands today with pt reporting decr tightness in hips. Encouraged pt to perform this exercise daily with caregivers as he had not been performing. Pt  also needing close w/c follow. Will continue to progress towards LTGs.    Personal Factors and Comorbidities Age;Time since onset of injury/illness/exacerbation    Examination-Activity Limitations Locomotion Level;Transfers;Sit;Stand;Toileting;Dressing;Hygiene/Grooming;Self Feeding;Bed Mobility;Bend;Bathing    Examination-Participation Restrictions Community Activity;Shop;Meal Prep;Yard Work    Merchant navy officer Evolving/Moderate complexity    Rehab Potential Fair    PT Frequency 2x / week    PT Duration 8 weeks    PT Treatment/Interventions ADLs/Self Care Home Management;Electrical Stimulation;DME Instruction;Gait training;Functional mobility training;Therapeutic  activities;Therapeutic exercise;Balance training;Neuromuscular re-education;Manual techniques;Patient/family education;Orthotic Fit/Training;Wheelchair mobility training;Passive range of motion;Dry needling;Taping    PT Next Visit Plan any update from [place] with more visits? Will need pt to schedule for more visits. Standing tolerance in // bars or edge of mat from elevated surface. Continue to work on LE strengthening, stand pivot transfers with RW vs. squat pivot transfers, pre gait activities. LE stretching to assist with tone/spasticity management prior to any standing or transfers. continue to work on gait with 2 person assit/Bluerocker AFO.    PT Home Exercise Plan Forward lean x20 with caregiver; standing 3x daily for at least 30 sec with caregiver; [place] 12/29/20    Consulted and Agree with Plan of Care Patient             Patient will benefit from skilled therapeutic intervention in order to improve the following deficits and impairments:  Abnormal gait, Difficulty walking, Impaired tone, Decreased range of motion, Decreased coordination, Decreased endurance, Impaired UE functional use, Decreased activity tolerance, Pain, Decreased balance, Decreased mobility, Decreased strength, Impaired sensation, Postural dysfunction, Improper body mechanics  Visit Diagnosis: Unsteadiness on feet  Muscle weakness (generalized)  Hemiplegia and hemiparesis following cerebral infarction affecting right dominant side (HCC)  Other lack of coordination     Problem List Patient Active Problem List   Diagnosis Date Noted   Depressive disorder 04/07/2021   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   Impacted cerumen, bilateral 04/07/2021   Unspecified hearing loss, bilateral 04/07/2021   Hemiparesis as late effect of cerebrovascular accident (CVA) ([place]) 04/02/2020   Cerebrovascular accident (CVA) ([place])    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   Hypertensive urgency 03/12/2020    Acute on chronic systolic CHF (congestive heart failure) ([place]) 03/12/2020   AKI (acute kidney injury) ([place]) 03/12/2020   Fall at home, initial encounter 03/12/2020   Right hip pain 03/12/2020   Hypokalemia 03/12/2020   Coronary artery disease involving native coronary artery of native heart without angina pectoris 09/07/2014   Renal insufficiency 12/02/2012   Preventative health care 11/13/2010   Secondary cardiomyopathy ([place]) 01/24/2010   TACHYCARDIA 01/24/2010   Type 2 diabetes mellitus without complication, with long-term current use of insulin ([place]) 11/20/2008   Mixed hyperlipidemia 11/12/2007   Essential hypertension 11/12/2007   CONGESTIVE HEART FAILURE 11/12/2007    Arliss Journey, PT, DPT  04/12/2021, 2:50 PM  [facility] [address] [place], [place], [address] Phone: [phone]   Fax:  [phone]  Name: DERONTE SOLIS MRN: [id number] Date of Birth: 03-12-1956

## 2021-04-13 NOTE — Progress Notes (Signed)
Carelink Summary Report / Loop Recorder 

## 2021-04-19 ENCOUNTER — Encounter: Payer: Self-pay | Admitting: Occupational Therapy

## 2021-04-19 ENCOUNTER — Ambulatory Visit: Payer: No Typology Code available for payment source | Admitting: Occupational Therapy

## 2021-04-19 ENCOUNTER — Ambulatory Visit: Payer: No Typology Code available for payment source | Admitting: Physical Therapy

## 2021-04-19 ENCOUNTER — Other Ambulatory Visit: Payer: Self-pay

## 2021-04-19 VITALS — HR 121

## 2021-04-19 DIAGNOSIS — M25621 Stiffness of right elbow, not elsewhere classified: Secondary | ICD-10-CM

## 2021-04-19 DIAGNOSIS — R41840 Attention and concentration deficit: Secondary | ICD-10-CM

## 2021-04-19 DIAGNOSIS — I69351 Hemiplegia and hemiparesis following cerebral infarction affecting right dominant side: Secondary | ICD-10-CM | POA: Diagnosis not present

## 2021-04-19 DIAGNOSIS — R278 Other lack of coordination: Secondary | ICD-10-CM

## 2021-04-19 DIAGNOSIS — R2681 Unsteadiness on feet: Secondary | ICD-10-CM

## 2021-04-19 DIAGNOSIS — R293 Abnormal posture: Secondary | ICD-10-CM

## 2021-04-19 DIAGNOSIS — M25611 Stiffness of right shoulder, not elsewhere classified: Secondary | ICD-10-CM

## 2021-04-19 DIAGNOSIS — M6281 Muscle weakness (generalized): Secondary | ICD-10-CM

## 2021-04-19 NOTE — Therapy (Signed)
[facility] [address] [place], [place], [address] Phone: [phone]   Fax:  [phone]  Occupational Therapy Treatment  Patient Details  Name: Trevor Anderson MRN: [id number] Date of Birth: 1955/12/24 Referring Provider (OT): Deland Pretty, NP   Encounter Date: 04/19/2021   OT End of Session - 04/19/21 1511     Visit Number 23    Number of Visits 30    Date for OT Re-Evaluation 05/31/21    Authorization Type VA    Authorization Time Period 30 visits approved thru 05/12/2021 (04/07/21  [id number]   15 visits   Expires 07/28/21   )    Authorization - Visit Number 22    Authorization - Number of Visits 30    Progress Note Due on Visit 30    OT Start Time 1400    OT Stop Time 1445    OT Time Calculation (min) 45 min    Activity Tolerance Patient tolerated treatment well    Behavior During Therapy [facility] for tasks assessed/performed             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   CONGESTIVE HEART FAILURE 11/12/2007   DIABETES MELLITUS, TYPE II 11/20/2008   HYPERLIPIDEMIA 11/12/2007   HYPERTENSION 11/12/2007   TACHYCARDIA 01/24/2010    Past Surgical History:  Procedure Laterality Date   BUBBLE STUDY  03/16/2020   Procedure: BUBBLE STUDY;  Surgeon: Buford Dresser, MD;  Location: [facility];  Service: Cardiovascular;;   LOOP RECORDER INSERTION N/A 03/23/2020   Procedure: LOOP RECORDER INSERTION;  Surgeon: Thompson Grayer, MD;  Location: [facility];  Service: Cardiovascular;  Laterality: N/A;   TEE WITHOUT CARDIOVERSION N/A 03/16/2020   Procedure: TRANSESOPHAGEAL ECHOCARDIOGRAM (TEE);  Surgeon: Buford Dresser, MD;  Location: [facility] ENDOSCOPY;  Service: Cardiovascular;  Laterality: N/A;   TONSILLECTOMY  1970    Vitals:   04/19/21 1459  Pulse: (!) 121     Subjective Assessment - 04/19/21 1459     Subjective  Patient reports having shoulder pain  earlier, but not currently, also reports intermittent back pain    Pertinent History HLD, HTN, DM, CHF    Limitations Fall Risk. Max/Total stand pivot transfer w RW. Loop Recorder (no ESTIM)    Currently in Pain? No/denies    Pain Score 0-No pain                          OT Treatments/Exercises (OP) - 04/19/21 0001       Neurological Re-education Exercises   Other Exercises 1 Neuromuscular reeducation to address postural control.  Patient unable to flex forward away from back of wheelchair using trunk muscles - uses arm and leg to pull self forwards.  Patient limited by range of motion restrictions in hips R>L.  Continue to work toward more active multi scoot transfers to utilize forward weight shift to allow pivot right/left.  Worked on seated postural control - more upright and oriented toward midline.  Patient drifts back to left and posterior without cueing and facilitation/assist.  Worked to improve alignment of right shoulder girdle than move toward weight shift right with arm in more neutral position versus strong IR position.  Patient without report of pain in shoulder if realigned before movement.    Other Exercises 2 Worked on sit to stand decreasing use of UE, orienting toward midline and accepting weight onto RLE.  Worked on increasing stand tolerance  and weight shift toward right as well as more upright standing.  Patient did not achieve full knee/ hip extension on right, although more able to take weight through RLE and activate extensors briefly.  Worked on increasing degrees of trunk extension in standing.                      OT Short Term Goals - 04/19/21 1511       OT SHORT TERM GOAL #1   Title Pt and caregivers will be independent with HEP    Time 4    Period Weeks    Status Achieved    Target Date 02/09/21      OT SHORT TERM GOAL #2   Title Pt will complete UB dressing with min A for pull over short sleeve shirt.    Baseline mod A    Time  4    Period Weeks    Status Achieved   per pt report - completing with min A for hemi arm.     OT SHORT TERM GOAL #3   Title Pt and caregiver will verbalize understanding of adapted strategies and/or equipment for completing toilet and tub transfers with mod A consistently.    Baseline currently mod/max and not getting to shower.    Time 4    Period Weeks    Status Not Met   max A for transfers and unable to fully get to tub bench and shower safely at this time and not getting on [facility] consistently at home. still using bed pan. 03/08/21     OT SHORT TERM GOAL #4   Title Pt will achieve 90* of PROM for RUE shoulder flexion to reduce risk of malpositioning and increased pain.    Time 4    Period Weeks    Status Achieved   90* with RUE shoulder flexion 03/08/21     OT SHORT TERM GOAL #5   Title Pt and caregivers will verbalize and demonstrate understanding of wear and care of any splints and/or orthoses PRN.    Time 4    Period Weeks    Status Achieved               OT Long Term Goals - 04/19/21 1512       OT LONG TERM GOAL #1   Title Pt and caregivers will be independent with and updated HEPs    Time 10    Period Weeks    Status On-going      OT LONG TERM GOAL #2   Title Pt will complete toilet and tub transfers consistently with min assistance with adapted strategies and equipment PRN    Baseline mod A for stand pivot    Time 10    Period Weeks    Status Deferred   Pt consistently mod - max transfer. 03/08/21     OT LONG TERM GOAL #3   Title Pt will complete bathing with min A    Baseline mod-max    Time 10    Period Weeks    Status On-going   simulated min A in the clinic 03/08/21 - pt able to reach 75% with washcloth while seated in tilt in space chair.     OT LONG TERM GOAL #4   Title Pt will demonstrate composite flexion/extension of 15% or greater in RUE for preparing for active grasp/release and increasing functional use.    Baseline trace extension    Time  10  Period Weeks    Status On-going   trace extension/approx 10% flexion 03/08/21     OT LONG TERM GOAL #5   Title Pt will achieve -20 degrees of elbow extension in RUE of PROM or decreasing risk of skin breakdown    Baseline -40*    Time 10    Period Weeks    Status On-going   -25* 03/08/21     OT LONG TERM GOAL #6   Title Pt and son will verbalize understanding of home modifications/equipment and/or adapted strategies for increasing independence with transfers for bathing and toileting and increasing safety and independence with ADLs/IADLs. (BSC, sponge baths vs tub bench, long handled sponge, cutting food, etc)    Time 12    Period Weeks    Status New                   Plan - 04/19/21 1511     Clinical Impression Statement Pt continues to show improvements in functional mobility despite extreme tension in right limbs.    OT Occupational Profile and History Problem Focused Assessment - Including review of records relating to presenting problem    Occupational performance deficits (Please refer to evaluation for details): IADL's;ADL's;Leisure    Body Structure / Function / Physical Skills Tone;Strength;ADL;FMC;Mobility;GMC;ROM;IADL;UE functional use;Decreased knowledge of use of DME    Cognitive Skills Problem Solve    Rehab Potential Good    Clinical Decision Making Limited treatment options, no task modification necessary    Comorbidities Affecting Occupational Performance: None    Modification or Assistance to Complete Evaluation  No modification of tasks or assist necessary to complete eval    OT Frequency 2x / week    OT Duration 12 weeks   16 visits after 11/15 pending VA auth/approval   OT Treatment/Interventions Self-care/ADL training;Manual Therapy;Patient/family education;Electrical Stimulation;Neuromuscular education;Functional Mobility Training;Passive range of motion;Cognitive remediation/compensation;Therapeutic exercise;Moist Heat;DME and/or AE  instruction;Therapeutic activities;Aquatic Therapy;Splinting    Plan continue working towards RUE NMR, trunk and hip mobility for increasing transfer independence.    OT Home Exercise Plan self PROM    Consulted and Agree with Plan of Care Patient;Family member/caregiver    Family Member Consulted son, Elwyn             Patient will benefit from skilled therapeutic intervention in order to improve the following deficits and impairments:   Body Structure / Function / Physical Skills: Tone, Strength, ADL, FMC, Mobility, GMC, ROM, IADL, UE functional use, Decreased knowledge of use of DME Cognitive Skills: Problem Solve     Visit Diagnosis: Abnormal posture  Muscle weakness (generalized)  Hemiplegia and hemiparesis following cerebral infarction affecting right dominant side (HCC)  Other lack of coordination  Attention and concentration deficit  Stiffness of right shoulder, not elsewhere classified  Stiffness of right elbow, not elsewhere classified  Unsteadiness on feet    Problem List Patient Active Problem List   Diagnosis Date Noted   Depressive disorder 04/07/2021   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   Impacted cerumen, bilateral 04/07/2021   Unspecified hearing loss, bilateral 04/07/2021   Hemiparesis as late effect of cerebrovascular accident (CVA) ([place]) 04/02/2020   Cerebrovascular accident (CVA) ([place])    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   Hypertensive urgency 03/12/2020   Acute on chronic systolic CHF (congestive heart failure) ([place]) 03/12/2020   AKI (acute kidney injury) ([place]) 03/12/2020   Fall at home, initial encounter 03/12/2020   Right hip pain 03/12/2020   Hypokalemia 03/12/2020   Coronary  artery disease involving native coronary artery of native heart without angina pectoris 09/07/2014   Renal insufficiency 12/02/2012   Preventative health care 11/13/2010   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

## 2021-04-21 ENCOUNTER — Other Ambulatory Visit: Payer: Self-pay

## 2021-04-21 ENCOUNTER — Encounter: Payer: Self-pay | Admitting: Physical Therapy

## 2021-04-21 ENCOUNTER — Encounter: Payer: No Typology Code available for payment source | Admitting: Occupational Therapy

## 2021-04-21 ENCOUNTER — Ambulatory Visit: Payer: No Typology Code available for payment source | Admitting: Physical Therapy

## 2021-04-21 DIAGNOSIS — I69351 Hemiplegia and hemiparesis following cerebral infarction affecting right dominant side: Secondary | ICD-10-CM | POA: Diagnosis not present

## 2021-04-21 DIAGNOSIS — M6281 Muscle weakness (generalized): Secondary | ICD-10-CM

## 2021-04-21 DIAGNOSIS — R293 Abnormal posture: Secondary | ICD-10-CM

## 2021-04-21 NOTE — Therapy (Addendum)
[facility] [address] [place], [place], [address] Phone: [phone]   Fax:  [phone]  Physical Therapy Treatment  Patient Details  Name: Trevor Anderson MRN: [id number] Date of Birth: Sep 02, 1955 Referring Provider (PT): Deland Pretty   Encounter Date: 04/21/2021   PT End of Session - 04/21/21 1320     Visit Number 30    Number of Visits 30    Date for PT Re-Evaluation 25/05/39   per recert on 76/73/41   Authorization Type VA auth# [id number] (15 visits); VA has approved additional 15 PT visits to 05/26/2021; 15 visits through 08/03/21    Authorization - Visit Number 1    Authorization - Number of Visits 15    PT Start Time 3299    PT Stop Time 1400    PT Time Calculation (min) 43 min    Equipment Utilized During Treatment Gait belt   Blue Rocker AFO   Activity Tolerance Patient tolerated treatment well    Behavior During Therapy [facility] for tasks assessed/performed             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   CONGESTIVE HEART FAILURE 11/12/2007   DIABETES MELLITUS, TYPE II 11/20/2008   HYPERLIPIDEMIA 11/12/2007   HYPERTENSION 11/12/2007   TACHYCARDIA 01/24/2010    Past Surgical History:  Procedure Laterality Date   BUBBLE STUDY  03/16/2020   Procedure: BUBBLE STUDY;  Surgeon: Buford Dresser, MD;  Location: [place];  Service: Cardiovascular;;   LOOP RECORDER INSERTION N/A 03/23/2020   Procedure: LOOP RECORDER INSERTION;  Surgeon: Thompson Grayer, MD;  Location: [facility];  Service: Cardiovascular;  Laterality: N/A;   TEE WITHOUT CARDIOVERSION N/A 03/16/2020   Procedure: TRANSESOPHAGEAL ECHOCARDIOGRAM (TEE);  Surgeon: Buford Dresser, MD;  Location: [facility] ENDOSCOPY;  Service: Cardiovascular;  Laterality: N/A;   TONSILLECTOMY  1970    There were no vitals filed for this visit.   Subjective Assessment - 04/21/21 1319     Subjective  No new complaints. No falls or pain to report.    Patient is accompained by: --   caregiver   Limitations Standing    How long can you stand comfortably? 5-10 minutes    Patient Stated Goals Open/close hand; Get more R LE motion;    Currently in Pain? No/denies    Pain Score 0-No pain               04/21/21 1321  Transfers  Transfers Sit to Stand;Stand to Sit  Sit to Stand 3: Mod assist;With upper extremity assist;From bed;From chair/3-in-1  Sit to Stand Details Tactile cues for weight shifting;Tactile cues for placement;Verbal cues for technique;Verbal cues for sequencing  Sit to Stand Details (indicate cue type and reason) x3 reps from elevated mat table with min A x1 - 2 therapists. Therapist placing foot between pt's legs to help with incr flexor tone and adduction of RLE when initially standing. When standing does need help from therapist for proper RUE placement on RW. x1 reps from pt's w/c. Cues first to scoot out towards edge, and for incr forward lean (counting to 3 before coming to stand).  Stand to Sit 3: Mod assist;With upper extremity assist;To bed;To chair/3-in-1;4: Min assist  Stand to Sit Details (indicate cue type and reason) Verbal cues for sequencing;Verbal cues for technique  Stand to Sit Details cues to reach back for controlled descent each time to mat table/wheelchair, then assist to remove left hand from  walker handle (therapist tends to tip walker as pt sits down so not to pull on UE).  Ambulation/Gait  Ambulation/Gait Yes  Ambulation/Gait Assistance 4: Min assist;3: Mod assist (of 2 therapists with wheelchair follow from rehab tech)  Ambulation/Gait Assistance Details Continued with use of blue rocker AFO to RLE and used blue shoe cover as simulated toe cap for incr foot clearance. Cues/facilitation throughout for posture, weight shifting, and bringing RLE through during swing phase and for proper placement. Cues throughout to stand tall through stance leg for  knee and hip extension, weight shifting for incr step length. Pt able to self advance RW and left LE. Pt able to assist with right LE advancement this session.  Ambulation Distance (Feet) 40 Feet (x1, 35 x1)  Assistive device Rolling walker;Other (Comment)  Gait Pattern Step-to pattern;Decreased stride length;Decreased step length - right;Decreased step length - left;Decreased stance time - right;Decreased hip/knee flexion - right;Decreased dorsiflexion - right;Decreased weight shift to right;Right flexed knee in stance;Decreased trunk rotation;Narrow base of support;Poor foot clearance - right  Ambulation Surface Level;Indoor  Exercises  Exercises Other Exercises  Other Exercises  seated in wheelchair- passive stretching of heel cords, hamstrings and hip adductors on right LE prior to mobility.  Knee/Hip Exercises: Aerobic  Other Aerobic Scift from wheelchair with rubber blocks used for wheelchair stability with bil LE's/left UE on level 2.0 x for strengthening, full range of motion, increased right LE use and activity tolerance. Cues to maintain speed.           PT Short Term Goals - 04/05/21 1430       PT SHORT TERM GOAL #1   Title Pt will continue to perform HEP with caregiver assistance, for improved flexibility, strength, transfers.    Baseline reports completing 2-3x/week with caregiver assistance, performing standing intermittently at home with son    Time 4    Period Weeks    Status Partially Met    Target Date 04/05/21      PT SHORT TERM GOAL #2   Title Pt will perform sit to stand transfer with min/mod assist from mat table with RW.    Baseline needs mod A from w/c, and min/mod A from mat table.    Time 4    Period Weeks    Status Achieved      PT SHORT TERM GOAL #3   Title Pt will perform stand pivot transfers with RW from w/c <> mat table with mod A x1 in order to demo improved functional transfers.    Time 4    Period Weeks    Status New      PT SHORT  TERM GOAL #4   Title Pt will ambulate at least 20' with RW with min-mod A +2 in order to demo improved functional mobility.    Baseline 21' on 04/05/21 with mod A +2 and RW    Time 4    Period Weeks    Status Achieved               PT Long Term Goals - 03/04/21 1434       PT LONG TERM GOAL #1   Title Pt will be able to perform progression of/finalized HEP and perform standing consistently at home with caregiver assist,  TARGET 05/03/21    Baseline pt's son reports working on exercises at home with caregiver - pt will benefit from ongoing revisions/additions, pt not performing standing at home with caregivers.    Time 8  Period Weeks    Status Revised    Target Date 05/03/21      PT LONG TERM GOAL #2   Title Pt will be able to perform all edge of bed scooting with min A for improved ease of transfers, decreased caregiver burden    Baseline 03/04/2021: pt requires cues and min assist to laterally scoot to L, and mod assist and cues to laterally scoot to R    Time 8    Period Weeks    Status On-going      PT LONG TERM GOAL #3   Title Caregiver will report standing at sink or RW at home, for at least 5 minutes, at least 5 days per week, with min guard>supervision, for improved standing tolerance for ADL participation.    Baseline 03/04/2021: pt's son reports not much standing at home    Time 8    Period Weeks    Status On-going      PT LONG TERM GOAL #4   Title Pt will be able to perform wheelchair transfer standpivot with min A for improved independence vs. sliding board transfer with min guard, in order to decrease caregiver burden    Baseline pt needing mod A to perform transfer    Time 8    Period Weeks    Status On-going      PT LONG TERM GOAL #5   Title Pt will ambulate at least 30' with RW with min A +2 in order to demo improved functional mobility.    Baseline 5' of mod A +2    Time 8    Period Weeks    Status New      Additional Long Term Goals   Additional  Long Term Goals Yes      PT LONG TERM GOAL #6   Title Pt will perform sit <> stands with RW and min A in order to demo improved functional transfers to decr caregiver burden.    Time 8    Period Weeks    Status New                   Plan - 04/21/21 1321     Clinical Impression Statement Today's skilled session continued to focus on strengthening, transfers and gait with no issues noted or reported in session. Pt with increased gait distance this session and improved ability to assist with right LE advancement.    Personal Factors and Comorbidities Age;Time since onset of injury/illness/exacerbation    Examination-Activity Limitations Locomotion Level;Transfers;Sit;Stand;Toileting;Dressing;Hygiene/Grooming;Self Feeding;Bed Mobility;Bend;Bathing    Examination-Participation Restrictions Community Activity;Shop;Meal Prep;Yard Work    [employer] Evolving/Moderate complexity    Rehab Potential Fair    PT Frequency 2x / week    PT Duration 8 weeks    PT Treatment/Interventions ADLs/Self Care Home Management;Electrical Stimulation;DME Instruction;Gait training;Functional mobility training;Therapeutic activities;Therapeutic exercise;Balance training;Neuromuscular re-education;Manual techniques;Patient/family education;Orthotic Fit/Training;Wheelchair mobility training;Passive range of motion;Dry needling;Taping    PT Next Visit Plan RECERT due 11/07/94, check goals as today was visit 30/30. Continue to work on LE strengthening, stand pivot transfers with RW vs. squat pivot transfers, pre gait activities. LE stretching to assist with tone/spasticity management prior to any standing or transfers. continue to work on gait with 2 person assit/Bluerocker AFO.    PT Home Exercise Plan Forward lean x20 with caregiver; standing 3x daily for at least 30 sec with caregiver; [place] 12/29/20    Consulted and Agree with Plan of Care Patient  Patient will benefit  from skilled therapeutic intervention in order to improve the following deficits and impairments:  Abnormal gait, Difficulty walking, Impaired tone, Decreased range of motion, Decreased coordination, Decreased endurance, Impaired UE functional use, Decreased activity tolerance, Pain, Decreased balance, Decreased mobility, Decreased strength, Impaired sensation, Postural dysfunction, Improper body mechanics  Visit Diagnosis: Muscle weakness (generalized)  Hemiplegia and hemiparesis following cerebral infarction affecting right dominant side (HCC)  Abnormal posture     Problem List Patient Active Problem List   Diagnosis Date Noted   Depressive disorder 04/07/2021   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   Impacted cerumen, bilateral 04/07/2021   Unspecified hearing loss, bilateral 04/07/2021   Hemiparesis as late effect of cerebrovascular accident (CVA) ([place]) 04/02/2020   Cerebrovascular accident (CVA) ([place])    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   Hypertensive urgency 03/12/2020   Acute on chronic systolic CHF (congestive heart failure) ([place]) 03/12/2020   AKI (acute kidney injury) ([place]) 03/12/2020   Fall at home, initial encounter 03/12/2020   Right hip pain 03/12/2020   Hypokalemia 03/12/2020   Coronary artery disease involving native coronary artery of native heart without angina pectoris 09/07/2014   Renal insufficiency 12/02/2012   Preventative health care 11/13/2010   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

## 2021-04-26 ENCOUNTER — Ambulatory Visit: Payer: No Typology Code available for payment source | Attending: Adult Health | Admitting: Occupational Therapy

## 2021-04-26 ENCOUNTER — Other Ambulatory Visit: Payer: Self-pay

## 2021-04-26 ENCOUNTER — Encounter: Payer: Self-pay | Admitting: Occupational Therapy

## 2021-04-26 ENCOUNTER — Ambulatory Visit: Payer: No Typology Code available for payment source | Admitting: Physical Therapy

## 2021-04-26 DIAGNOSIS — M25611 Stiffness of right shoulder, not elsewhere classified: Secondary | ICD-10-CM | POA: Diagnosis present

## 2021-04-26 DIAGNOSIS — R2681 Unsteadiness on feet: Secondary | ICD-10-CM | POA: Diagnosis present

## 2021-04-26 DIAGNOSIS — R278 Other lack of coordination: Secondary | ICD-10-CM | POA: Insufficient documentation

## 2021-04-26 DIAGNOSIS — R41840 Attention and concentration deficit: Secondary | ICD-10-CM | POA: Diagnosis present

## 2021-04-26 DIAGNOSIS — I69351 Hemiplegia and hemiparesis following cerebral infarction affecting right dominant side: Secondary | ICD-10-CM | POA: Diagnosis present

## 2021-04-26 DIAGNOSIS — R293 Abnormal posture: Secondary | ICD-10-CM | POA: Insufficient documentation

## 2021-04-26 DIAGNOSIS — M6281 Muscle weakness (generalized): Secondary | ICD-10-CM | POA: Insufficient documentation

## 2021-04-26 DIAGNOSIS — M25621 Stiffness of right elbow, not elsewhere classified: Secondary | ICD-10-CM | POA: Diagnosis present

## 2021-04-26 NOTE — Therapy (Signed)
[facility] [address] [place], [place], [address] Phone: [phone]   Fax:  [phone]  Occupational Therapy Treatment  Patient Details  Name: Trevor Anderson MRN: [id number] Date of Birth: 1955-06-08 Referring Provider (OT): Deland Pretty, NP   Encounter Date: 04/26/2021   OT End of Session - 04/26/21 1455     Visit Number 24    Number of Visits 30    Date for OT Re-Evaluation 05/31/21    Authorization Type [facility]    Authorization Time Period 30 visits approved thru 05/12/2021 (04/07/21  [id number]   15 visits   Expires 07/28/21   )    Authorization - Visit Number 23    Authorization - Number of Visits 30    Progress Note Due on Visit 30    OT Start Time 1450    OT Stop Time 1530    OT Time Calculation (min) 40 min    Activity Tolerance Patient tolerated treatment well    Behavior During Therapy [facility] for tasks assessed/performed             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   CONGESTIVE HEART FAILURE 11/12/2007   DIABETES MELLITUS, TYPE II 11/20/2008   HYPERLIPIDEMIA 11/12/2007   HYPERTENSION 11/12/2007   TACHYCARDIA 01/24/2010    Past Surgical History:  Procedure Laterality Date   BUBBLE STUDY  03/16/2020   Procedure: BUBBLE STUDY;  Surgeon: Buford Dresser, MD;  Location: [facility];  Service: Cardiovascular;;   LOOP RECORDER INSERTION N/A 03/23/2020   Procedure: LOOP RECORDER INSERTION;  Surgeon: Thompson Grayer, MD;  Location: [facility];  Service: Cardiovascular;  Laterality: N/A;   TEE WITHOUT CARDIOVERSION N/A 03/16/2020   Procedure: TRANSESOPHAGEAL ECHOCARDIOGRAM (TEE);  Surgeon: Buford Dresser, MD;  Location: [facility] ENDOSCOPY;  Service: Cardiovascular;  Laterality: N/A;   TONSILLECTOMY  1970    There were no vitals filed for this visit.   Subjective Assessment - 04/26/21 1454     Subjective  "yea it was alright" about NYE     Pertinent History HLD, HTN, DM, CHF    Limitations Fall Risk. Max/Total stand pivot transfer w RW. Loop Recorder (no ESTIM)    Currently in Pain? No/denies    Pain Score 2     Pain Location Back    Pain Orientation Lower    Pain Descriptors / Indicators Aching    Pain Type Acute pain    Pain Onset Today    Pain Frequency Intermittent               Standing with hi/lo mat in front with mod A For sit to stand and min A for maintaining balance. Pt unable to get hand or forearm on mat to get weight bearing as initially anticipated.  Pt with significant spasticity in RUE hand today.   Manual Therapy/PROM to RUE at elbow, shoulder, wrist and hand. Pt reports wearing resting hand splint every night but intructed on increasing wear to 2-3 hours during the day for prolonged stretch. Pt with trace activation noted at tricep but otherwise patient with little to no volitional movements   Wheelchair Navigation pt used LLE with R foot crossed over and LUE for maneuvering and navigating tilt in space wheelchair today. Patient was able to complete with minimal assistance.                      OT Short Term Goals - 04/19/21 1511  OT SHORT TERM GOAL #1   Title Pt and caregivers will be independent with HEP    Time 4    Period Weeks    Status Achieved    Target Date 02/09/21      OT SHORT TERM GOAL #2   Title Pt will complete UB dressing with min A for pull over short sleeve shirt.    Baseline mod A    Time 4    Period Weeks    Status Achieved   per pt report - completing with min A for hemi arm.     OT SHORT TERM GOAL #3   Title Pt and caregiver will verbalize understanding of adapted strategies and/or equipment for completing toilet and tub transfers with mod A consistently.    Baseline currently mod/max and not getting to shower.    Time 4    Period Weeks    Status Not Met   max A for transfers and unable to fully get to tub bench and shower safely at this time  and not getting on [facility] consistently at home. still using bed pan. 03/08/21     OT SHORT TERM GOAL #4   Title Pt will achieve 90* of PROM for RUE shoulder flexion to reduce risk of malpositioning and increased pain.    Time 4    Period Weeks    Status Achieved   90* with RUE shoulder flexion 03/08/21     OT SHORT TERM GOAL #5   Title Pt and caregivers will verbalize and demonstrate understanding of wear and care of any splints and/or orthoses PRN.    Time 4    Period Weeks    Status Achieved               OT Long Term Goals - 04/19/21 1512       OT LONG TERM GOAL #1   Title Pt and caregivers will be independent with and updated HEPs    Time 10    Period Weeks    Status On-going      OT LONG TERM GOAL #2   Title Pt will complete toilet and tub transfers consistently with min assistance with adapted strategies and equipment PRN    Baseline mod A for stand pivot    Time 10    Period Weeks    Status Deferred   Pt consistently mod - max transfer. 03/08/21     OT LONG TERM GOAL #3   Title Pt will complete bathing with min A    Baseline mod-max    Time 10    Period Weeks    Status On-going   simulated min A in the clinic 03/08/21 - pt able to reach 75% with washcloth while seated in tilt in space chair.     OT LONG TERM GOAL #4   Title Pt will demonstrate composite flexion/extension of 15% or greater in RUE for preparing for active grasp/release and increasing functional use.    Baseline trace extension    Time 10    Period Weeks    Status On-going   trace extension/approx 10% flexion 03/08/21     OT LONG TERM GOAL #5   Title Pt will achieve -20 degrees of elbow extension in RUE of PROM or decreasing risk of skin breakdown    Baseline -40*    Time 10    Period Weeks    Status On-going   -25* 03/08/21     OT LONG TERM GOAL #6  Title Pt and son will verbalize understanding of home modifications/equipment and/or adapted strategies for increasing independence with  transfers for bathing and toileting and increasing safety and independence with ADLs/IADLs. (BSC, sponge baths vs tub bench, long handled sponge, cutting food, etc)    Time 12    Period Weeks    Status New                    Patient will benefit from skilled therapeutic intervention in order to improve the following deficits and impairments:           Visit Diagnosis: Muscle weakness (generalized)  Hemiplegia and hemiparesis following cerebral infarction affecting right dominant side (HCC)  Other lack of coordination  Attention and concentration deficit  Stiffness of right shoulder, not elsewhere classified  Stiffness of right elbow, not elsewhere classified    Problem List Patient Active Problem List   Diagnosis Date Noted   Depressive disorder 04/07/2021   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   Impacted cerumen, bilateral 04/07/2021   Unspecified hearing loss, bilateral 04/07/2021   Hemiparesis as late effect of cerebrovascular accident (CVA) ([place]) 04/02/2020   Cerebrovascular accident (CVA) ([place])    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   Hypertensive urgency 03/12/2020   Acute on chronic systolic CHF (congestive heart failure) ([place]) 03/12/2020   AKI (acute kidney injury) ([place]) 03/12/2020   Fall at home, initial encounter 03/12/2020   Right hip pain 03/12/2020   Hypokalemia 03/12/2020   Coronary artery disease involving native coronary artery of native heart without angina pectoris 09/07/2014   Renal insufficiency 12/02/2012   Preventative health care 11/13/2010   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

## 2021-05-03 ENCOUNTER — Encounter: Payer: Self-pay | Admitting: Physical Therapy

## 2021-05-03 ENCOUNTER — Ambulatory Visit: Payer: No Typology Code available for payment source | Admitting: Occupational Therapy

## 2021-05-03 ENCOUNTER — Encounter: Payer: Self-pay | Admitting: Occupational Therapy

## 2021-05-03 ENCOUNTER — Ambulatory Visit: Payer: No Typology Code available for payment source | Admitting: Physical Therapy

## 2021-05-03 ENCOUNTER — Other Ambulatory Visit: Payer: Self-pay

## 2021-05-03 DIAGNOSIS — R2681 Unsteadiness on feet: Secondary | ICD-10-CM

## 2021-05-03 DIAGNOSIS — R278 Other lack of coordination: Secondary | ICD-10-CM

## 2021-05-03 DIAGNOSIS — M6281 Muscle weakness (generalized): Secondary | ICD-10-CM | POA: Diagnosis not present

## 2021-05-03 DIAGNOSIS — I69351 Hemiplegia and hemiparesis following cerebral infarction affecting right dominant side: Secondary | ICD-10-CM

## 2021-05-03 DIAGNOSIS — M25621 Stiffness of right elbow, not elsewhere classified: Secondary | ICD-10-CM

## 2021-05-03 DIAGNOSIS — R41840 Attention and concentration deficit: Secondary | ICD-10-CM

## 2021-05-03 DIAGNOSIS — M25611 Stiffness of right shoulder, not elsewhere classified: Secondary | ICD-10-CM

## 2021-05-03 NOTE — Therapy (Addendum)
[facility] [address] [place], [place], [address] Phone: [phone]   Fax:  [phone]  Physical Therapy Treatment/Re-Cert  Patient Details  Name: Trevor Anderson MRN: [id number] Date of Birth: 01-Oct-1955 Referring Provider (PT): Deland Pretty   Encounter Date: 05/03/2021    05/03/21 1448  PT Visits / Re-Eval  Visit Number 31  Number of Visits 46  Date for PT Re-Evaluation 08/03/21  Authorization  Authorization Type [facility] auth# [id number] (15 visits); VA has approved additional 15 PT visits to 05/26/2021; 15 visits through 08/03/21  Authorization - Visit Number 2  Authorization - Number of Visits 15  PT Time Calculation  PT Start Time 1446  PT Stop Time 1530  PT Time Calculation (min) 44 min  PT - End of Session  Equipment Utilized During Treatment Gait belt (Blue Rocker AFO)  Activity Tolerance Patient tolerated treatment well  Behavior During Therapy [facility] for tasks assessed/performed    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   CONGESTIVE HEART FAILURE 11/12/2007   DIABETES MELLITUS, TYPE II 11/20/2008   HYPERLIPIDEMIA 11/12/2007   HYPERTENSION 11/12/2007   TACHYCARDIA 01/24/2010    Past Surgical History:  Procedure Laterality Date   BUBBLE STUDY  03/16/2020   Procedure: BUBBLE STUDY;  Surgeon: Buford Dresser, MD;  Location: [facility];  Service: Cardiovascular;;   LOOP RECORDER INSERTION N/A 03/23/2020   Procedure: LOOP RECORDER INSERTION;  Surgeon: Thompson Grayer, MD;  Location: [facility];  Service: Cardiovascular;  Laterality: N/A;   TEE WITHOUT CARDIOVERSION N/A 03/16/2020   Procedure: TRANSESOPHAGEAL ECHOCARDIOGRAM (TEE);  Surgeon: Buford Dresser, MD;  Location: [facility] ENDOSCOPY;  Service: Cardiovascular;  Laterality: N/A;   TONSILLECTOMY  1970    There were no vitals filed for this visit.   Subjective Assessment - 05/03/21 1445      Subjective No new complaints. No falls. Lost a cap on his tooth so having some tooth aches today. Saw a [place] doctor (caregiver unsure who it was). Was not a good visit as the doctor come to the lobby and asked pt to walk on back, etc ("I don't think he read his chart" per caregiver). Has been referred to [employer] for inserts for his shoes "to make walking easier".    Patient is accompained by: --   CNA/caregiver   Limitations Standing    How long can you stand comfortably? 5-10 minutes    Patient Stated Goals Open/close hand; Get more R LE motion;    Pain Score 1     Pain Location Shoulder    Pain Orientation Left    Pain Descriptors / Indicators Aching;Sore    Pain Type Chronic pain    Pain Onset More than a month ago    Pain Frequency Intermittent    Aggravating Factors  certain movements of right arm    Pain Relieving Factors resting, streching                 05/17/21 1200  Assessment  Medical Diagnosis CVA affecting R side  Referring Provider (PT) Deland Pretty  Hand Dominance Right  Prior Function  Level of Independence Independent        [facility] Adult PT Treatment/Exercise - 05/03/21 1449       Transfers   Transfers Sit to Stand;Stand to Sit    Sit to Stand 3: Mod assist;With upper extremity assist;From bed;From chair/3-in-1    Sit to Stand Details Tactile cues for weight shifting;Tactile cues for  placement;Verbal cues for technique;Verbal cues for sequencing    Sit to Stand Details (indicate cue type and reason) x3 reps from mat table to RW with cues to scoot to edge of mat, for hand placement and rocking used to facilitate anterior weight shifting    Stand to Sit 3: Mod assist;With upper extremity assist;To bed;To chair/3-in-1;4: Min assist    Stand to Sit Details (indicate cue type and reason) Verbal cues for sequencing;Verbal cues for technique    Stand to Sit Details cues to reach back with left UE for more controlled descent    Stand Pivot Transfers 4: Min  assist;3: Mod assist   with assist of rehab tech   Stand Pivot Transfer Details (indicate cue type and reason) with RW from mat table to wheelchair with cues on seqencing, left LE advancement, weight shifting and posture. pt able to assist with right LE movements and walker movements as well.      Neuro Re-ed    Neuro Re-ed Details  for balance/muscle re-ed: seated on green air disc at edge of mat table to work on core strengthening. Had pt perform left UE raises, then reaching all directions out of base of support for multiple reps in all directions with min to mod assist for recovery of balance on return to sitting in base of support. then with left UE support on mat had pt perform left long arc quads, followed by left marching with empahsis on staying midline and balance recovery with min to mod assist.      Exercises   Exercises Other Exercises    Other Exercises  supine on mat table: with left LE extended out and right LE bent with foot on mat table- hip flexion bringing foot to edge of mat<>back in with max assistance for lifting, trace muscle activation with cues to relax core/hips as pt performing bridging instead (this was with left knee bent as well, improved when left LE extended), then hip fall out with pt able to "push" PTA out/assist needed to bring knee back in for 10 reps; right foot on pillow case on slide board for heel slides with mod/max assist needed for movement in to flexion/control back into extension; with red pball under bil knees- bridging, lower trunk rotation      Knee/Hip Exercises: Stretches   Other Knee/Hip Stretches supine on mat table: passive streching of right heel cords, hamstrings and hip adductors for prolonged holds each.                     PT Short Term Goals - 04/05/21 1430       PT SHORT TERM GOAL #1   Title Pt will continue to perform HEP with caregiver assistance, for improved flexibility, strength, transfers.    Baseline reports  completing 2-3x/week with caregiver assistance, performing standing intermittently at home with son    Time 4    Period Weeks    Status Partially Met    Target Date 04/05/21      PT SHORT TERM GOAL #2   Title Pt will perform sit to stand transfer with min/mod assist from mat table with RW.    Baseline needs mod A from w/c, and min/mod A from mat table.    Time 4    Period Weeks    Status Achieved      PT SHORT TERM GOAL #3   Title Pt will perform stand pivot transfers with RW from w/c <> mat table with mod  A x1 in order to demo improved functional transfers.    Time 4    Period Weeks    Status New      PT SHORT TERM GOAL #4   Title Pt will ambulate at least 20' with RW with min-mod A +2 in order to demo improved functional mobility.    Baseline 21' on 04/05/21 with mod A +2 and RW    Time 4    Period Weeks    Status Achieved            Updated STGs:    PT Short Term Goals - 05/17/21 1210       PT SHORT TERM GOAL #1   Title Pt and caregiver will report standing at least 1x a day at the sink for improved standing tolerance. ALL STGS DUE 05/31/21    Baseline reports inconsistent performing of standing at home.    Time 4    Period Weeks    Status Partially Met    Target Date 05/31/21      PT SHORT TERM GOAL #2   Title Pt will perform sit to stand transfer with min A from mat table with RW.    Baseline needs min/mod A depending on fatigue level    Time 4    Period Weeks    Status Revised      PT SHORT TERM GOAL #3   Title Pt will perform squat pivot transfer from w/c <> mat table with mod A in order to demo decr caregiver burden.    Time 4    Period Weeks    Status New      PT SHORT TERM GOAL #4   Title Pt will ambulate at least 60' with RW with min A +2 in order to demo improved functional mobility.    Baseline 04/21/21 with pt able to ambulate 35-40 x1' with min/mod A x2 therapists with w/c follow    Time 4    Period Weeks    Status Revised                PT Long Term Goals - 05/17/21 1200       PT LONG TERM GOAL #1   Title Pt will be able to perform progression of/finalized HEP and perform standing consistently at home with caregiver assist,  TARGET 05/03/21    Baseline pt's son reports working on exercises at home with caregiver - pt will benefit from ongoing revisions/additions, pt not performing standing at home with caregivers.    Time 8    Period Weeks    Status Not Met    Target Date 05/03/21      PT LONG TERM GOAL #2   Title Pt will be able to perform all edge of bed scooting with min A for improved ease of transfers, decreased caregiver burden    Baseline pt continues to require cues and min assist to laterally scoot to L, and mod assist and cues to laterally scoot to R    Time 8    Period Weeks    Status Not Met      PT LONG TERM GOAL #3   Title Caregiver will report standing at sink or RW at home, for at least 5 minutes, at least 5 days per week, with min guard>supervision, for improved standing tolerance for ADL participation.    Baseline inconsistent standing at home    Time 8    Period Weeks    Status Not Met  PT LONG TERM GOAL #4   Title Pt will be able to perform wheelchair transfer standpivot with min A for improved independence vs. sliding board transfer with min guard, in order to decrease caregiver burden    Baseline pt needing mod A to min A with stand pivot transfer with RW, depends on the day.    Time 8    Period Weeks    Status Partially Met      PT LONG TERM GOAL #5   Title Pt will ambulate at least 20' with RW with min A +2 in order to demo improved functional mobility.    Baseline last assessed on 04/21/21 with pt able to ambulate 35-40 x1' with min/mod A x2 therapists with w/c follow    Time 8    Period Weeks    Status Partially Met      PT LONG TERM GOAL #6   Title Pt will perform sit <> stands with RW and min A in order to demo improved functional transfers to decr caregiver burden.     Baseline needed mod A on 05/03/21    Time 8    Period Weeks    Status Not Met            Updated LTGs for re-cert:    PT Long Term Goals - 05/17/21 1212       PT LONG TERM GOAL #1   Title Pt will be able to perform progression of/finalized HEP and perform standing consistently at home with caregiver assist,  TARGET 06/28/21    Baseline pt's son reports working on exercises at home with caregiver - pt will benefit from ongoing revisions/additions, pt not performing standing at home with caregivers.    Time 8    Period Weeks    Status On-going    Target Date 06/28/21      PT LONG TERM GOAL #2   Title Pt will be able to perform all edge of bed scooting with min A for improved ease of transfers, decreased caregiver burden    Baseline pt continues to require cues and min assist to laterally scoot to L, and mod assist and cues to laterally scoot to R    Time 8    Period Weeks    Status On-going      PT LONG TERM GOAL #3   Title Caregiver will report standing at sink or RW at home, for at least 5 minutes, at least 5 days per week, with min guard>supervision, for improved standing tolerance for ADL participation.    Baseline inconsistent standing at home    Time 8    Period Weeks    Status On-going      PT LONG TERM GOAL #4   Title Pt will be able to perform wheelchair transfer squat pivot with min A for improved independence vs. sliding board transfer with min guard, in order to decrease caregiver burden    Baseline pt needing mod A to min A with stand pivot transfer with RW, depends on the day.    Time 8    Period Weeks    Status Revised      PT LONG TERM GOAL #5   Title Pt will ambulate at least 68' with RW with mod A x1 in order to demo improved functional mobility.    Baseline last assessed on 04/21/21 with pt able to ambulate 35-40 x1' with min/mod A x2 therapists with w/c follow    Time 8  Period Weeks    Status Revised    Target Date 06/28/21      PT LONG TERM GOAL  #6   Title Pt will perform sit <> stands with RW consistently with min A from w/c and mat table in order to demo improved functional transfers to decr caregiver burden.    Baseline needed mod A on 05/03/21    Time 8    Period Weeks    Status Revised                 05/03/21 1448  Plan  Clinical Impression Statement Today's skilled session continued to focus on right LE stretching and strengthening with tone noted and rest breaks taken as needed. Pt continues to have limited muscle activation of hip flexors, quads and adductors. Also addressed core strengthening as well while seated on airdisc with min to mod assist needed at times for balance. The pt is making progress and should benefit from continued PT to progress toward unmet goals. Re-cert per primary PT: Pt has had to miss a couple visits due to providers being out of office and one time having a lack of transportation. Based on performance during this session and most recent sessions - pt has either partially met/not met his LTGs. Last time when assesesd, pt was performing inconsitent standing/HEP performance at home. Depending on the day and pt's tone, pt required mod A for sit <> stands and min/mod A with stand pivot transfers with RW and 2nd person assist of rehab tech. Pt able to progress his gait distance using R AFO and +2 therapists providing min/mod A. On 04/21/21 able to ambulate 35-40' x1 in single bout. When STGs were assessed pt only able to ambulate 21' with mod A +2. Pt is making slow and steady progress and will continue to benefit from skilled PT in order to work on ROM, strength, balance, core/posture work, gait, functional mobility and transfers in order to decr caregiver burden and improve functional mobility. STGs/LTGs updated as appropriate.  Personal Factors and Comorbidities Age;Time since onset of injury/illness/exacerbation  Examination-Activity Limitations Locomotion  Level;Transfers;Sit;Stand;Toileting;Dressing;Hygiene/Grooming;Self Feeding;Bed Mobility;Bend;Bathing  Examination-Participation Restrictions Community Activity;Shop;Meal Prep;Yard Work  Pt will benefit from skilled therapeutic intervention in order to improve on the following deficits Abnormal gait;Difficulty walking;Impaired tone;Decreased range of motion;Decreased coordination;Decreased endurance;Impaired UE functional use;Decreased activity tolerance;Pain;Decreased balance;Decreased mobility;Decreased strength;Impaired sensation;Postural dysfunction;Improper body mechanics  Stability/Clinical Decision Making Evolving/Moderate complexity  Rehab Potential Fair  PT Frequency 2x / week  PT Duration 12 weeks  PT Treatment/Interventions ADLs/Self Care Home Management;Electrical Stimulation;DME Instruction;Gait training;Functional mobility training;Therapeutic activities;Therapeutic exercise;Balance training;Neuromuscular re-education;Manual techniques;Patient/family education;Orthotic Fit/Training;Wheelchair mobility training;Passive range of motion;Dry needling;Taping  PT Next Visit Plan Standing tolerance in // bars or edge of mat from elevated surface. Continue to work on LE strengthening, stand pivot transfers with RW vs. squat pivot transfers, pre gait activities. LE stretching to assist with tone/spasticity management prior to any standing or transfers. continue to work on gait with 2 person assit/Bluerocker AFO.  PT Home Exercise Plan Forward lean x20 with caregiver; standing 3x daily for at least 30 sec with caregiver; [place] 12/29/20  Consulted and Agree with Plan of Care Patient  Family Member Consulted [place] present during part of session.    Patient will benefit from skilled therapeutic intervention in order to improve the following deficits and impairments:  Abnormal gait, Difficulty walking, Impaired tone, Decreased range of motion, Decreased coordination, Decreased endurance, Impaired  UE functional use, Decreased activity tolerance, Pain, Decreased balance, Decreased mobility,  Decreased strength, Impaired sensation, Postural dysfunction, Improper body mechanics  Visit Diagnosis: Muscle weakness (generalized)  Hemiplegia and hemiparesis following cerebral infarction affecting right dominant side (HCC)  Unsteadiness on feet     Problem List Patient Active Problem List   Diagnosis Date Noted   Depressive disorder 04/07/2021   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   Impacted cerumen, bilateral 04/07/2021   Unspecified hearing loss, bilateral 04/07/2021   Hemiparesis as late effect of cerebrovascular accident (CVA) ([place]) 04/02/2020   Cerebrovascular accident (CVA) ([place])    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   Hypertensive urgency 03/12/2020   Acute on chronic systolic CHF (congestive heart failure) ([place]) 03/12/2020   AKI (acute kidney injury) ([place]) 03/12/2020   Fall at home, initial encounter 03/12/2020   Right hip pain 03/12/2020   Hypokalemia 03/12/2020   Coronary artery disease involving native coronary artery of native heart without angina pectoris 09/07/2014   Renal insufficiency 12/02/2012   Preventative health care 11/13/2010   Secondary cardiomyopathy ([place]) 01/24/2010   TACHYCARDIA 01/24/2010   Type 2 diabetes mellitus without complication, with long-term current use of insulin ([place]) 11/20/2008   Mixed hyperlipidemia 11/12/2007   Essential hypertension 11/12/2007   CONGESTIVE HEART FAILURE 11/12/2007    Willow Ora, PTA, [facility] [address], [place], [place] [address] [phone] 05/03/21, 8:50 PM   Name: Trevor Anderson MRN: [id number] Date of Birth: [birth year]   Re-Cert:  Janann August, PT, DPT 05/17/21 12:08 PM

## 2021-05-03 NOTE — Therapy (Signed)
[facility] [address] [place], [place], [address] Phone: [phone]   Fax:  [phone]  Occupational Therapy Treatment  Patient Details  Name: Trevor Anderson MRN: [id number] Date of Birth: Apr 14, 1956 Referring Provider (OT): Deland Pretty, NP   Encounter Date: 05/03/2021   OT End of Session - 05/03/21 1432     Visit Number 25    Number of Visits 30    Date for OT Re-Evaluation 05/31/21    Authorization Type VA    Authorization Time Period 30 visits approved thru 05/12/2021 (04/07/21  [id number]   15 visits   Expires 07/28/21   )    Authorization - Visit Number 24    Authorization - Number of Visits 30    Progress Note Due on Visit 30    OT Start Time 1400    OT Stop Time 1440    OT Time Calculation (min) 40 min    Activity Tolerance Patient tolerated treatment well    Behavior During Therapy [facility] for tasks assessed/performed             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   CONGESTIVE HEART FAILURE 11/12/2007   DIABETES MELLITUS, TYPE II 11/20/2008   HYPERLIPIDEMIA 11/12/2007   HYPERTENSION 11/12/2007   TACHYCARDIA 01/24/2010    Past Surgical History:  Procedure Laterality Date   BUBBLE STUDY  03/16/2020   Procedure: BUBBLE STUDY;  Surgeon: Buford Dresser, MD;  Location: [facility];  Service: Cardiovascular;;   LOOP RECORDER INSERTION N/A 03/23/2020   Procedure: LOOP RECORDER INSERTION;  Surgeon: Thompson Grayer, MD;  Location: [facility] CV LAB;  Service: Cardiovascular;  Laterality: N/A;   TEE WITHOUT CARDIOVERSION N/A 03/16/2020   Procedure: TRANSESOPHAGEAL ECHOCARDIOGRAM (TEE);  Surgeon: Buford Dresser, MD;  Location: [facility] ENDOSCOPY;  Service: Cardiovascular;  Laterality: N/A;   TONSILLECTOMY  1970    There were no vitals filed for this visit.   Subjective Assessment - 05/03/21 1404     Subjective  "they said they're gonna do that botox"     Pertinent History HLD, HTN, DM, CHF    Limitations Fall Risk. Max/Total stand pivot transfer w RW. Loop Recorder (no ESTIM)    Currently in Pain? Yes    Pain Score 1     Pain Location Shoulder    Pain Orientation Left    Pain Descriptors / Indicators Sore    Pain Type Chronic pain    Pain Onset More than a month ago    Pain Frequency Intermittent              Weight bearing with RUE into pebble on edge of mat with reaching anteriorly with LUE to place resistance clothespins 1-8# on antenna with intermittent rest breaks and total A for maintaining RUE on pebble.  Sit to Stand with mod A with rolling walker with assistance for lifting and maintaining standing balance.  RUE P/ROM                      OT Short Term Goals - 04/19/21 1511       OT SHORT TERM GOAL #1   Title Pt and caregivers will be independent with HEP    Time 4    Period Weeks    Status Achieved    Target Date 02/09/21      OT SHORT TERM GOAL #2   Title Pt will complete UB dressing with min A for pull  over short sleeve shirt.    Baseline mod A    Time 4    Period Weeks    Status Achieved   per pt report - completing with min A for hemi arm.     OT SHORT TERM GOAL #3   Title Pt and caregiver will verbalize understanding of adapted strategies and/or equipment for completing toilet and tub transfers with mod A consistently.    Baseline currently mod/max and not getting to shower.    Time 4    Period Weeks    Status Not Met   max A for transfers and unable to fully get to tub bench and shower safely at this time and not getting on [facility] consistently at home. still using bed pan. 03/08/21     OT SHORT TERM GOAL #4   Title Pt will achieve 90* of PROM for RUE shoulder flexion to reduce risk of malpositioning and increased pain.    Time 4    Period Weeks    Status Achieved   90* with RUE shoulder flexion 03/08/21     OT SHORT TERM GOAL #5   Title Pt and caregivers will verbalize and  demonstrate understanding of wear and care of any splints and/or orthoses PRN.    Time 4    Period Weeks    Status Achieved               OT Long Term Goals - 04/19/21 1512       OT LONG TERM GOAL #1   Title Pt and caregivers will be independent with and updated HEPs    Time 10    Period Weeks    Status On-going      OT LONG TERM GOAL #2   Title Pt will complete toilet and tub transfers consistently with min assistance with adapted strategies and equipment PRN    Baseline mod A for stand pivot    Time 10    Period Weeks    Status Deferred   Pt consistently mod - max transfer. 03/08/21     OT LONG TERM GOAL #3   Title Pt will complete bathing with min A    Baseline mod-max    Time 10    Period Weeks    Status On-going   simulated min A in the clinic 03/08/21 - pt able to reach 75% with washcloth while seated in tilt in space chair.     OT LONG TERM GOAL #4   Title Pt will demonstrate composite flexion/extension of 15% or greater in RUE for preparing for active grasp/release and increasing functional use.    Baseline trace extension    Time 10    Period Weeks    Status On-going   trace extension/approx 10% flexion 03/08/21     OT LONG TERM GOAL #5   Title Pt will achieve -20 degrees of elbow extension in RUE of PROM or decreasing risk of skin breakdown    Baseline -40*    Time 10    Period Weeks    Status On-going   -25* 03/08/21     OT LONG TERM GOAL #6   Title Pt and son will verbalize understanding of home modifications/equipment and/or adapted strategies for increasing independence with transfers for bathing and toileting and increasing safety and independence with ADLs/IADLs. (BSC, sponge baths vs tub bench, long handled sponge, cutting food, etc)    Time 12    Period Weeks    Status New  Plan - 05/03/21 1439     Clinical Impression Statement Pt with slow and steady progress with standing tolerance and balance and RUE NMR. Pt  would benefit from botox for spasticity management.    OT Occupational Profile and History Problem Focused Assessment - Including review of records relating to presenting problem    Occupational performance deficits (Please refer to evaluation for details): IADL's;ADL's;Leisure    Body Structure / Function / Physical Skills Tone;Strength;ADL;FMC;Mobility;GMC;ROM;IADL;UE functional use;Decreased knowledge of use of DME    Cognitive Skills Problem Solve    Rehab Potential Good    Clinical Decision Making Limited treatment options, no task modification necessary    Comorbidities Affecting Occupational Performance: None    Modification or Assistance to Complete Evaluation  No modification of tasks or assist necessary to complete eval    OT Frequency 2x / week    OT Duration 12 weeks   16 visits after 11/15 pending VA auth/approval   OT Treatment/Interventions Self-care/ADL training;Manual Therapy;Patient/family education;Electrical Stimulation;Neuromuscular education;Functional Mobility Training;Passive range of motion;Cognitive remediation/compensation;Therapeutic exercise;Moist Heat;DME and/or AE instruction;Therapeutic activities;Aquatic Therapy;Splinting    Plan continue working towards RUE NMR, trunk and hip mobility for increasing transfer independence.    OT Home Exercise Plan self PROM    Consulted and Agree with Plan of Care Patient;Family member/caregiver    Family Member Consulted son, Finnick             Patient will benefit from skilled therapeutic intervention in order to improve the following deficits and impairments:   Body Structure / Function / Physical Skills: Tone, Strength, ADL, FMC, Mobility, GMC, ROM, IADL, UE functional use, Decreased knowledge of use of DME Cognitive Skills: Problem Solve     Visit Diagnosis: Unsteadiness on feet  Muscle weakness (generalized)  Hemiplegia and hemiparesis following cerebral infarction affecting right dominant side (HCC)  Other  lack of coordination  Attention and concentration deficit  Stiffness of right shoulder, not elsewhere classified  Stiffness of right elbow, not elsewhere classified    Problem List Patient Active Problem List   Diagnosis Date Noted   Depressive disorder 04/07/2021   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   Impacted cerumen, bilateral 04/07/2021   Unspecified hearing loss, bilateral 04/07/2021   Hemiparesis as late effect of cerebrovascular accident (CVA) ([place]) 04/02/2020   Cerebrovascular accident (CVA) ([place])    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   Hypertensive urgency 03/12/2020   Acute on chronic systolic CHF (congestive heart failure) ([place]) 03/12/2020   AKI (acute kidney injury) ([place]) 03/12/2020   Fall at home, initial encounter 03/12/2020   Right hip pain 03/12/2020   Hypokalemia 03/12/2020   Coronary artery disease involving native coronary artery of native heart without angina pectoris 09/07/2014   Renal insufficiency 12/02/2012   Preventative health care 11/13/2010   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 [place], [place], [address] Phone: [phone]   Fax:  [phone]  Name: NEDIM OKI MRN: [id number] Date of Birth: December 15, 1955

## 2021-05-09 ENCOUNTER — Ambulatory Visit (INDEPENDENT_AMBULATORY_CARE_PROVIDER_SITE_OTHER): Payer: No Typology Code available for payment source

## 2021-05-09 DIAGNOSIS — I639 Cerebral infarction, unspecified: Secondary | ICD-10-CM | POA: Diagnosis not present

## 2021-05-10 ENCOUNTER — Ambulatory Visit: Payer: No Typology Code available for payment source | Admitting: Occupational Therapy

## 2021-05-10 ENCOUNTER — Ambulatory Visit: Payer: No Typology Code available for payment source | Admitting: Physical Therapy

## 2021-05-10 LAB — CUP PACEART REMOTE DEVICE CHECK
Date Time Interrogation Session: 20230115230905
Implantable Pulse Generator Implant Date: 20211130

## 2021-05-17 ENCOUNTER — Encounter: Payer: Self-pay | Admitting: Occupational Therapy

## 2021-05-17 ENCOUNTER — Ambulatory Visit: Payer: No Typology Code available for payment source | Admitting: Occupational Therapy

## 2021-05-17 ENCOUNTER — Ambulatory Visit: Payer: No Typology Code available for payment source | Admitting: Physical Therapy

## 2021-05-17 ENCOUNTER — Other Ambulatory Visit: Payer: Self-pay

## 2021-05-17 ENCOUNTER — Encounter: Payer: Self-pay | Admitting: Physical Therapy

## 2021-05-17 DIAGNOSIS — M25611 Stiffness of right shoulder, not elsewhere classified: Secondary | ICD-10-CM

## 2021-05-17 DIAGNOSIS — I69351 Hemiplegia and hemiparesis following cerebral infarction affecting right dominant side: Secondary | ICD-10-CM

## 2021-05-17 DIAGNOSIS — R2681 Unsteadiness on feet: Secondary | ICD-10-CM

## 2021-05-17 DIAGNOSIS — M6281 Muscle weakness (generalized): Secondary | ICD-10-CM

## 2021-05-17 DIAGNOSIS — R293 Abnormal posture: Secondary | ICD-10-CM

## 2021-05-17 DIAGNOSIS — M25621 Stiffness of right elbow, not elsewhere classified: Secondary | ICD-10-CM

## 2021-05-17 DIAGNOSIS — R278 Other lack of coordination: Secondary | ICD-10-CM

## 2021-05-17 DIAGNOSIS — R41840 Attention and concentration deficit: Secondary | ICD-10-CM

## 2021-05-17 NOTE — Therapy (Signed)
[facility] [address] [place], [place], [address] Phone: [phone]   Fax:  [phone]  Occupational Therapy Treatment  Patient Details  Name: Trevor Anderson MRN: [id number] Date of Birth: 04-Dec-1955 Referring Provider (OT): Deland Pretty, NP   Encounter Date: 05/17/2021   OT End of Session - 05/17/21 1404     Visit Number 26    Number of Visits 30    Date for OT Re-Evaluation 05/31/21    Authorization Type VA    Authorization Time Period 30 visits approved thru 05/12/2021 (04/07/21  [id number]   15 visits   Expires 07/28/21   )    Authorization - Visit Number 25    Authorization - Number of Visits 30    Progress Note Due on Visit 30    OT Start Time 9892    OT Stop Time 1445    OT Time Calculation (min) 43 min    Activity Tolerance Patient tolerated treatment well    Behavior During Therapy [facility] for tasks assessed/performed             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   CONGESTIVE HEART FAILURE 11/12/2007   DIABETES MELLITUS, TYPE II 11/20/2008   HYPERLIPIDEMIA 11/12/2007   HYPERTENSION 11/12/2007   TACHYCARDIA 01/24/2010    Past Surgical History:  Procedure Laterality Date   BUBBLE STUDY  03/16/2020   Procedure: BUBBLE STUDY;  Surgeon: Buford Dresser, MD;  Location: [facility];  Service: Cardiovascular;;   LOOP RECORDER INSERTION N/A 03/23/2020   Procedure: LOOP RECORDER INSERTION;  Surgeon: Thompson Grayer, MD;  Location: [place] CV LAB;  Service: Cardiovascular;  Laterality: N/A;   TEE WITHOUT CARDIOVERSION N/A 03/16/2020   Procedure: TRANSESOPHAGEAL ECHOCARDIOGRAM (TEE);  Surgeon: Buford Dresser, MD;  Location: [facility] ENDOSCOPY;  Service: Cardiovascular;  Laterality: N/A;   TONSILLECTOMY  1970    There were no vitals filed for this visit.   Subjective Assessment - 05/17/21 1403     Subjective  "they messed up my ride last week" Pt  denies any changes.    Pertinent History HLD, HTN, DM, CHF    Limitations Fall Risk. Max/Total stand pivot transfer w RW. Loop Recorder (no ESTIM)    Patient Stated Goals to move it (RUE)    Currently in Pain? No/denies    Pain Score 0-No pain                          OT Treatments/Exercises (OP) - 05/17/21 1405       ADLs   Eating introduced patient to rocker knife and cutting up red theraputty for increased independence with self-feeding - issued information regarding purchase of equipment    Toileting pt also reports son has gotten him to [facility] x 2 for bowel movements. pt continues to urinate in brief vs urinal. Pt reportrs he can manage urinal independently and will try and do it more.    Bathing pt simulated bathing with about 50% assistance - mod A - with assitsance for reaching feet, back and bathing LUE.    Functional Mobility pt reports getting in and out of the shower easier and getting in the shower more. Pt reports his son is getting him to the shower.    Cooking --      Manual Therapy   Manual Therapy Passive ROM    Manual therapy comments for RUE for spastcity management - pt with increased  elbow extension noted but continues to demonstrate significant tone in RUE                      OT Short Term Goals - 04/19/21 1511       OT SHORT TERM GOAL #1   Title Pt and caregivers will be independent with HEP    Time 4    Period Weeks    Status Achieved    Target Date 02/09/21      OT SHORT TERM GOAL #2   Title Pt will complete UB dressing with min A for pull over short sleeve shirt.    Baseline mod A    Time 4    Period Weeks    Status Achieved   per pt report - completing with min A for hemi arm.     OT SHORT TERM GOAL #3   Title Pt and caregiver will verbalize understanding of adapted strategies and/or equipment for completing toilet and tub transfers with mod A consistently.    Baseline currently mod/max and not getting to shower.     Time 4    Period Weeks    Status Not Met   max A for transfers and unable to fully get to tub bench and shower safely at this time and not getting on [facility] consistently at home. still using bed pan. 03/08/21     OT SHORT TERM GOAL #4   Title Pt will achieve 90* of PROM for RUE shoulder flexion to reduce risk of malpositioning and increased pain.    Time 4    Period Weeks    Status Achieved   90* with RUE shoulder flexion 03/08/21     OT SHORT TERM GOAL #5   Title Pt and caregivers will verbalize and demonstrate understanding of wear and care of any splints and/or orthoses PRN.    Time 4    Period Weeks    Status Achieved               OT Long Term Goals - 04/19/21 1512       OT LONG TERM GOAL #1   Title Pt and caregivers will be independent with and updated HEPs    Time 10    Period Weeks    Status On-going      OT LONG TERM GOAL #2   Title Pt will complete toilet and tub transfers consistently with min assistance with adapted strategies and equipment PRN    Baseline mod A for stand pivot    Time 10    Period Weeks    Status Deferred   Pt consistently mod - max transfer. 03/08/21     OT LONG TERM GOAL #3   Title Pt will complete bathing with min A    Baseline mod-max    Time 10    Period Weeks    Status On-going   simulated min A in the clinic 03/08/21 - pt able to reach 75% with washcloth while seated in tilt in space chair.     OT LONG TERM GOAL #4   Title Pt will demonstrate composite flexion/extension of 15% or greater in RUE for preparing for active grasp/release and increasing functional use.    Baseline trace extension    Time 10    Period Weeks    Status On-going   trace extension/approx 10% flexion 03/08/21     OT LONG TERM GOAL #5   Title Pt will achieve -20 degrees of elbow  extension in RUE of PROM or decreasing risk of skin breakdown    Baseline -40*    Time 10    Period Weeks    Status On-going   -25* 03/08/21     OT LONG TERM GOAL #6   Title Pt  and son will verbalize understanding of home modifications/equipment and/or adapted strategies for increasing independence with transfers for bathing and toileting and increasing safety and independence with ADLs/IADLs. (BSC, sponge baths vs tub bench, long handled sponge, cutting food, etc)    Time 12    Period Weeks    Status New                   Plan - 05/17/21 1505     Clinical Impression Statement Pt continues with slow and steady progress however reports increased ability to get in/out of the shower and on/off BSC at home with assistance from son. Pt demonstarted increased independence with self feeding and cutting food with AE today.    OT Occupational Profile and History Problem Focused Assessment - Including review of records relating to presenting problem    Occupational performance deficits (Please refer to evaluation for details): IADL's;ADL's;Leisure    Body Structure / Function / Physical Skills Tone;Strength;ADL;FMC;Mobility;GMC;ROM;IADL;UE functional use;Decreased knowledge of use of DME    Cognitive Skills Problem Solve    Rehab Potential Good    Clinical Decision Making Limited treatment options, no task modification necessary    Comorbidities Affecting Occupational Performance: None    Modification or Assistance to Complete Evaluation  No modification of tasks or assist necessary to complete eval    OT Frequency 2x / week    OT Duration 12 weeks   16 visits after 11/15 pending VA auth/approval   OT Treatment/Interventions Self-care/ADL training;Manual Therapy;Patient/family education;Electrical Stimulation;Neuromuscular education;Functional Mobility Training;Passive range of motion;Cognitive remediation/compensation;Therapeutic exercise;Moist Heat;DME and/or AE instruction;Therapeutic activities;Aquatic Therapy;Splinting    Plan discuss continued plan of care vs discharge at end of scheduled visits. (scheduled one visit over POC date and possibly one over [place]  authorized visits currently), trunk and hip mobility for increasing independence with ADLs, RUE spasticity management    OT Home Exercise Plan self PROM    Consulted and Agree with Plan of Care Patient;Family member/caregiver    Family Member Consulted son, Gamaliel             Patient will benefit from skilled therapeutic intervention in order to improve the following deficits and impairments:   Body Structure / Function / Physical Skills: Tone, Strength, ADL, FMC, Mobility, GMC, ROM, IADL, UE functional use, Decreased knowledge of use of DME Cognitive Skills: Problem Solve     Visit Diagnosis: Muscle weakness (generalized)  Unsteadiness on feet  Hemiplegia and hemiparesis following cerebral infarction affecting right dominant side (HCC)  Other lack of coordination  Attention and concentration deficit  Stiffness of right shoulder, not elsewhere classified  Stiffness of right elbow, not elsewhere classified    Problem List Patient Active Problem List   Diagnosis Date Noted   Depressive disorder 04/07/2021   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   Impacted cerumen, bilateral 04/07/2021   Unspecified hearing loss, bilateral 04/07/2021   Hemiparesis as late effect of cerebrovascular accident (CVA) ([place]) 04/02/2020   Cerebrovascular accident (CVA) ([place])    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   Hypertensive urgency 03/12/2020   Acute on chronic systolic CHF (congestive heart failure) ([place]) 03/12/2020   AKI (acute kidney injury) ([place]) 03/12/2020   Fall at home, initial  encounter 03/12/2020   Right hip pain 03/12/2020   Hypokalemia 03/12/2020   Coronary artery disease involving native coronary artery of native heart without angina pectoris 09/07/2014   Renal insufficiency 12/02/2012   Preventative health care 11/13/2010   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

## 2021-05-17 NOTE — Addendum Note (Signed)
Addended by: Arliss Journey on: 05/17/2021 12:15 PM   Modules accepted: Orders

## 2021-05-18 NOTE — Therapy (Signed)
[facility] [address] [place], [place], [address] Phone: [phone]   Fax:  [phone]  Physical Therapy Treatment  Patient Details  Name: Trevor Anderson MRN: [id number] Date of Birth: 1955/06/01 Referring Provider (PT): Deland Pretty   Encounter Date: 05/17/2021   PT End of Session - 05/17/21 1319     Visit Number 32    Number of Visits 46    Date for PT Re-Evaluation 08/03/21    Authorization Type VA auth# [id number] (15 visits); VA has approved additional 15 PT visits to 05/26/2021; 15 visits through 08/03/21    Authorization - Visit Number 3    Authorization - Number of Visits 15    PT Start Time 1561    PT Stop Time 1400    PT Time Calculation (min) 43 min    Equipment Utilized During Treatment Gait belt   [place]er AFO   Activity Tolerance Patient tolerated treatment well    Behavior During Therapy [facility] for tasks assessed/performed             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   CONGESTIVE HEART FAILURE 11/12/2007   DIABETES MELLITUS, TYPE II 11/20/2008   HYPERLIPIDEMIA 11/12/2007   HYPERTENSION 11/12/2007   TACHYCARDIA 01/24/2010    Past Surgical History:  Procedure Laterality Date   BUBBLE STUDY  03/16/2020   Procedure: BUBBLE STUDY;  Surgeon: Buford Dresser, MD;  Location: [facility];  Service: Cardiovascular;;   LOOP RECORDER INSERTION N/A 03/23/2020   Procedure: LOOP RECORDER INSERTION;  Surgeon: Thompson Grayer, MD;  Location: [facility];  Service: Cardiovascular;  Laterality: N/A;   TEE WITHOUT CARDIOVERSION N/A 03/16/2020   Procedure: TRANSESOPHAGEAL ECHOCARDIOGRAM (TEE);  Surgeon: Buford Dresser, MD;  Location: [facility] ENDOSCOPY;  Service: Cardiovascular;  Laterality: N/A;   TONSILLECTOMY  1970    There were no vitals filed for this visit.   Subjective Assessment - 05/17/21 1318     Subjective No new complaints. No  falls or pain.    Patient is accompained by: --   CNA/caregiver   Limitations Standing    How long can you stand comfortably? 5-10 minutes    Patient Stated Goals Open/close hand; Get more R LE motion;    Currently in Pain? No/denies    Pain Score 0-No pain                     [facility] Adult PT Treatment/Exercise - 05/17/21 1321       Transfers   Transfers Sit to Stand;Stand to Sit    Sit to Stand 3: Mod assist;With upper extremity assist;From bed;From chair/3-in-1    Sit to Stand Details Verbal cues for sequencing;Verbal cues for technique;Verbal cues for precautions/safety;Verbal cues for safe use of DME/AE    Sit to Stand Details (indicate cue type and reason) reminder cues to scoot to edge of surface prior to standing. cues for anterior weight shifting and for hand placement with standing. x2 reps from wheelchari>walker, x2 reps mat table>walker    Stand to Sit 3: Mod assist;With upper extremity assist;To bed;To chair/3-in-1;4: Min assist    Stand to Sit Details (indicate cue type and reason) Verbal cues for sequencing;Verbal cues for technique    Stand to Sit Details reminder cues to reach back with left UE for more controlled desent each time    Stand Pivot Transfers 4: Min assist   with 2cd person for safety   Stand Pivot  Transfer Details (indicate cue type and reason) with RW from wheelchair to mat table with cues on posture, sequencing with walker and step placement.      Ambulation/Gait   Ambulation/Gait Yes    Ambulation/Gait Assistance 4: Min assist   of 2 therapists with CNA providing wheelchair follow   Ambulation/Gait Assistance Details cues for trunk/hip extension and sequencing with gait. assist needed for right LE advacnement, cues/faciliation for weight shifing onto right LE in stance along with cues for increased quad activation in stance.    Ambulation Distance (Feet) 30 Feet   x1, 20 x1   Assistive device Rolling walker;Other (Comment)   right AFO with  simulated toe cap   Gait Pattern Step-to pattern;Decreased stride length;Decreased step length - right;Decreased step length - left;Decreased stance time - right;Decreased hip/knee flexion - right;Decreased dorsiflexion - right;Decreased weight shift to right;Right flexed knee in stance;Decreased trunk rotation;Narrow base of support;Poor foot clearance - right    Ambulation Surface Level;Indoor      Neuro Re-ed    Neuro Re-ed Details  for balance/muscle re-ed: standing next to mat table with RW support working toward upright posture, progressing ot lateral weight shifiting, progressing to alternating forward/backward LE stepping with min to mod assist for balance/posture.      Knee/Hip Exercises: Stretches   Other Knee/Hip Stretches seated in wheelchair: right LE passive stretching of heel cords, hamstrings and hip adductors.                    PT Short Term Goals - 05/17/21 1210       PT SHORT TERM GOAL #1   Title Pt and caregiver will report standing at least 1x a day at the sink for improved standing tolerance. ALL STGS DUE 05/31/21    Baseline reports inconsistent performing of standing at home.    Time 4    Period Weeks    Status Partially Met    Target Date 05/31/21      PT SHORT TERM GOAL #2   Title Pt will perform sit to stand transfer with min A from mat table with RW.    Baseline needs min/mod A depending on fatigue level    Time 4    Period Weeks    Status Revised      PT SHORT TERM GOAL #3   Title Pt will perform squat pivot transfer from w/c <> mat table with mod A in order to demo decr caregiver burden.    Time 4    Period Weeks    Status New      PT SHORT TERM GOAL #4   Title Pt will ambulate at least 69' with RW with min A +2 in order to demo improved functional mobility.    Baseline 04/21/21 with pt able to ambulate 35-40 x1' with min/mod A x2 therapists with w/c follow    Time 4    Period Weeks    Status Revised               PT Long Term  Goals - 05/17/21 1212       PT LONG TERM GOAL #1   Title Pt will be able to perform progression of/finalized HEP and perform standing consistently at home with caregiver assist,  TARGET 06/28/21    Baseline pt's son reports working on exercises at home with caregiver - pt will benefit from ongoing revisions/additions, pt not performing standing at home with caregivers.    Time 8  Period Weeks    Status On-going    Target Date 06/28/21      PT LONG TERM GOAL #2   Title Pt will be able to perform all edge of bed scooting with min A for improved ease of transfers, decreased caregiver burden    Baseline pt continues to require cues and min assist to laterally scoot to L, and mod assist and cues to laterally scoot to R    Time 8    Period Weeks    Status On-going      PT LONG TERM GOAL #3   Title Caregiver will report standing at sink or RW at home, for at least 5 minutes, at least 5 days per week, with min guard>supervision, for improved standing tolerance for ADL participation.    Baseline inconsistent standing at home    Time 8    Period Weeks    Status On-going      PT LONG TERM GOAL #4   Title Pt will be able to perform wheelchair transfer squat pivot with min A for improved independence vs. sliding board transfer with min guard, in order to decrease caregiver burden    Baseline pt needing mod A to min A with stand pivot transfer with RW, depends on the day.    Time 8    Period Weeks    Status Revised      PT LONG TERM GOAL #5   Title Pt will ambulate at least 40' with RW with mod A x1 in order to demo improved functional mobility.    Baseline last assessed on 04/21/21 with pt able to ambulate 35-40 x1' with min/mod A x2 therapists with w/c follow    Time 8    Period Weeks    Status Revised    Target Date 06/28/21      PT LONG TERM GOAL #6   Title Pt will perform sit <> stands with RW consistently with min A from w/c and mat table in order to demo improved functional  transfers to decr caregiver burden.    Baseline needed mod A on 05/03/21    Time 8    Period Weeks    Status Revised                   Plan - 05/17/21 1319     Clinical Impression Statement Today's skilled session continued to focus on transfers, balance and gait with RW. Rest breaks taken as needed due to fatigue. Pt continues to await appoitment with [facility] for botox or equivalent to address tone/spasticity and has appointment next month with [employer] for brace consult. The pt is progressing and should benefit from continued PT to progress toward unmet goals.    Personal Factors and Comorbidities Age;Time since onset of injury/illness/exacerbation    Examination-Activity Limitations Locomotion Level;Transfers;Sit;Stand;Toileting;Dressing;Hygiene/Grooming;Self Feeding;Bed Mobility;Bend;Bathing    Examination-Participation Restrictions Community Activity;Shop;Meal Prep;Yard Work    Merchant [employer] officer Evolving/Moderate complexity    Rehab Potential Fair    PT Frequency 2x / week    PT Duration 12 weeks    PT Treatment/Interventions ADLs/Self Care Home Management;Electrical Stimulation;DME Instruction;Gait training;Functional mobility training;Therapeutic activities;Therapeutic exercise;Balance training;Neuromuscular re-education;Manual techniques;Patient/family education;Orthotic Fit/Training;Wheelchair mobility training;Passive range of motion;Dry needling;Taping    PT Next Visit Plan Standing tolerance in // bars or edge of mat from elevated surface. Continue to work on LE strengthening, stand pivot transfers with RW vs. squat pivot transfers, pre gait activities. LE stretching to assist with tone/spasticity management prior to any standing  or transfers. continue to work on gait with 2 person assit/Bluerocker AFO.    PT Home Exercise Plan Forward lean x20 with caregiver; standing 3x daily for at least 30 sec with caregiver; [place] 12/29/20    Consulted and Agree with  Plan of Care Patient    Family Member Consulted Cargiver present during part of session.             Patient will benefit from skilled therapeutic intervention in order to improve the following deficits and impairments:  Abnormal gait, Difficulty walking, Impaired tone, Decreased range of motion, Decreased coordination, Decreased endurance, Impaired UE functional use, Decreased activity tolerance, Pain, Decreased balance, Decreased mobility, Decreased strength, Impaired sensation, Postural dysfunction, Improper body mechanics  Visit Diagnosis: Unsteadiness on feet  Muscle weakness (generalized)  Hemiplegia and hemiparesis following cerebral infarction affecting right dominant side (HCC)  Abnormal posture     Problem List Patient Active Problem List   Diagnosis Date Noted   Depressive disorder 04/07/2021   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   Impacted cerumen, bilateral 04/07/2021   Unspecified hearing loss, bilateral 04/07/2021   Hemiparesis as late effect of cerebrovascular accident (CVA) ([place]) 04/02/2020   Cerebrovascular accident (CVA) ([place])    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   Hypertensive urgency 03/12/2020   Acute on chronic systolic CHF (congestive heart failure) ([place]) 03/12/2020   AKI (acute kidney injury) ([place]) 03/12/2020   Fall at home, initial encounter 03/12/2020   Right hip pain 03/12/2020   Hypokalemia 03/12/2020   Coronary artery disease involving native coronary artery of native heart without angina pectoris 09/07/2014   Renal insufficiency 12/02/2012   Preventative health care 11/13/2010   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

## 2021-05-19 NOTE — Progress Notes (Signed)
Carelink Summary Report / Loop Recorder 

## 2021-05-20 ENCOUNTER — Ambulatory Visit: Payer: No Typology Code available for payment source | Admitting: Occupational Therapy

## 2021-05-20 ENCOUNTER — Ambulatory Visit: Payer: No Typology Code available for payment source | Admitting: Physical Therapy

## 2021-05-20 ENCOUNTER — Other Ambulatory Visit: Payer: Self-pay

## 2021-05-20 ENCOUNTER — Encounter: Payer: Self-pay | Admitting: Physical Therapy

## 2021-05-20 DIAGNOSIS — M6281 Muscle weakness (generalized): Secondary | ICD-10-CM | POA: Diagnosis not present

## 2021-05-20 DIAGNOSIS — M25621 Stiffness of right elbow, not elsewhere classified: Secondary | ICD-10-CM

## 2021-05-20 DIAGNOSIS — R278 Other lack of coordination: Secondary | ICD-10-CM

## 2021-05-20 DIAGNOSIS — M25611 Stiffness of right shoulder, not elsewhere classified: Secondary | ICD-10-CM

## 2021-05-20 DIAGNOSIS — I69351 Hemiplegia and hemiparesis following cerebral infarction affecting right dominant side: Secondary | ICD-10-CM

## 2021-05-20 DIAGNOSIS — R293 Abnormal posture: Secondary | ICD-10-CM

## 2021-05-20 DIAGNOSIS — R41840 Attention and concentration deficit: Secondary | ICD-10-CM

## 2021-05-20 DIAGNOSIS — R2681 Unsteadiness on feet: Secondary | ICD-10-CM

## 2021-05-20 NOTE — Therapy (Signed)
[facility] [address] [place], [place], [address] Phone: [phone]   Fax:  [phone]  Physical Therapy Treatment  Patient Details  Name: Trevor Anderson MRN: [id number] Date of Birth: 07-20-1955 Referring Provider (PT): Deland Pretty   Encounter Date: 05/20/2021   PT End of Session - 05/20/21 1405     Visit Number 33    Number of Visits 71    Date for PT Re-Evaluation 08/03/21    Authorization Type VA auth# [id number] (15 visits); VA has approved additional 15 PT visits to 05/26/2021; 15 visits through 08/03/21    Authorization - Visit Number 4    Authorization - Number of Visits 15    PT Start Time 1400    PT Stop Time 9758    PT Time Calculation (min) 42 min    Equipment Utilized During Treatment Gait belt;Other (comment)   AFO   Activity Tolerance Patient tolerated treatment well    Behavior During Therapy [facility] for tasks assessed/performed             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   CONGESTIVE HEART FAILURE 11/12/2007   DIABETES MELLITUS, TYPE II 11/20/2008   HYPERLIPIDEMIA 11/12/2007   HYPERTENSION 11/12/2007   TACHYCARDIA 01/24/2010    Past Surgical History:  Procedure Laterality Date   BUBBLE STUDY  03/16/2020   Procedure: BUBBLE STUDY;  Surgeon: Buford Dresser, MD;  Location: [facility];  Service: Cardiovascular;;   LOOP RECORDER INSERTION N/A 03/23/2020   Procedure: LOOP RECORDER INSERTION;  Surgeon: Thompson Grayer, MD;  Location: [place] [facility];  Service: Cardiovascular;  Laterality: N/A;   TEE WITHOUT CARDIOVERSION N/A 03/16/2020   Procedure: TRANSESOPHAGEAL ECHOCARDIOGRAM (TEE);  Surgeon: Buford Dresser, MD;  Location: [facility] ENDOSCOPY;  Service: Cardiovascular;  Laterality: N/A;   TONSILLECTOMY  1970    There were no vitals filed for this visit.   Subjective Assessment - 05/20/21 1404     Subjective No new complaints. No  falls or pain.    Patient is accompained by: Family member   son   Limitations Standing    How long can you stand comfortably? 5-10 minutes    Patient Stated Goals Open/close hand; Get more R LE motion;    Currently in Pain? No/denies    Pain Score 0-No pain                      [facility] Adult PT Treatment/Exercise - 05/20/21 1405       Transfers   Transfers Sit to Stand;Stand to Sit    Sit to Stand 4: Min assist;3: Mod assist;From elevated surface;With upper extremity assist;From bed;From chair/3-in-1    Sit to Stand Details Verbal cues for sequencing;Verbal cues for technique;Verbal cues for precautions/safety;Verbal cues for safe use of DME/AE    Sit to Stand Details (indicate cue type and reason) reminder cues to scoot to edge of surface and for hand placement with 1st stand from mat table. min assist x3reps from mat table to RW, mod x1 from wheelchair to RW    Stand to Sit 3: Mod assist;With upper extremity assist;To elevated surface;To bed;To chair/3-in-1;Uncontrolled descent    Stand to Sit Details (indicate cue type and reason) Verbal cues for sequencing;Verbal cues for technique    Stand to Sit Details cues to use left UE for more controlled descent x2 to mat table, x2 to wheelchair      Ambulation/Gait   Ambulation/Gait Yes  Ambulation/Gait Assistance 4: Min assist   2 person assist with son bringing the wheelchair follow   Ambulation/Gait Assistance Details pt able to self advance walker, left LE and initiated hip/knee flexion on right with max assist for complete swing (slide) through. cues for knee extension in stance on bil LE's.    Ambulation Distance (Feet) 30 Feet   x1, 20 x1   Assistive device Rolling walker;Other (Comment)   right AFO with simulated toe cap   Gait Pattern Step-to pattern;Decreased stride length;Decreased step length - right;Decreased step length - left;Decreased stance time - right;Decreased hip/knee flexion - right;Decreased dorsiflexion -  right;Decreased weight shift to right;Right flexed knee in stance;Decreased trunk rotation;Narrow base of support;Poor foot clearance - right    Ambulation Surface Level;Indoor      Neuro Re-ed    Neuro Re-ed Details  for balance/muscle re-ed: standing with RW support next to mat table with min assist, cues/facilitation for upright posture. then with second stand at RW once tall working on forward/backward stepping in place for reps each side with assist needed on right LE.      Exercises   Exercises Other Exercises    Other Exercises  supine on mat table: passive heel cord stretching, hamstring stretching and hip adductor stretching for ~2 minutes each on right LE. then working on right LE heel slides on mat table with pt able to demo muscle contraction for hip/knee flexion however assitance needed to break tone to alllow for flexion, pt then able to slide foot back out with assis for controlled movement.                     PT Short Term Goals - 05/17/21 1210       PT SHORT TERM GOAL #1   Title Pt and caregiver will report standing at least 1x a day at the sink for improved standing tolerance. ALL STGS DUE 05/31/21    Baseline reports inconsistent performing of standing at home.    Time 4    Period Weeks    Status Partially Met    Target Date 05/31/21      PT SHORT TERM GOAL #2   Title Pt will perform sit to stand transfer with min A from mat table with RW.    Baseline needs min/mod A depending on fatigue level    Time 4    Period Weeks    Status Revised      PT SHORT TERM GOAL #3   Title Pt will perform squat pivot transfer from w/c <> mat table with mod A in order to demo decr caregiver burden.    Time 4    Period Weeks    Status New      PT SHORT TERM GOAL #4   Title Pt will ambulate at least 11' with RW with min A +2 in order to demo improved functional mobility.    Baseline 04/21/21 with pt able to ambulate 35-40 x1' with min/mod A x2 therapists with w/c follow     Time 4    Period Weeks    Status Revised               PT Long Term Goals - 05/17/21 1212       PT LONG TERM GOAL #1   Title Pt will be able to perform progression of/finalized HEP and perform standing consistently at home with caregiver assist,  TARGET 06/28/21    Baseline pt's son reports working on  exercises at home with caregiver - pt will benefit from ongoing revisions/additions, pt not performing standing at home with caregivers.    Time 8    Period Weeks    Status On-going    Target Date 06/28/21      PT LONG TERM GOAL #2   Title Pt will be able to perform all edge of bed scooting with min A for improved ease of transfers, decreased caregiver burden    Baseline pt continues to require cues and min assist to laterally scoot to L, and mod assist and cues to laterally scoot to R    Time 8    Period Weeks    Status On-going      PT LONG TERM GOAL #3   Title Caregiver will report standing at sink or RW at home, for at least 5 minutes, at least 5 days per week, with min guard>supervision, for improved standing tolerance for ADL participation.    Baseline inconsistent standing at home    Time 8    Period Weeks    Status On-going      PT LONG TERM GOAL #4   Title Pt will be able to perform wheelchair transfer squat pivot with min A for improved independence vs. sliding board transfer with min guard, in order to decrease caregiver burden    Baseline pt needing mod A to min A with stand pivot transfer with RW, depends on the day.    Time 8    Period Weeks    Status Revised      PT LONG TERM GOAL #5   Title Pt will ambulate at least 61' with RW with mod A x1 in order to demo improved functional mobility.    Baseline last assessed on 04/21/21 with pt able to ambulate 35-40 x1' with min/mod A x2 therapists with w/c follow    Time 8    Period Weeks    Status Revised    Target Date 06/28/21      PT LONG TERM GOAL #6   Title Pt will perform sit <> stands with RW  consistently with min A from w/c and mat table in order to demo improved functional transfers to decr caregiver burden.    Baseline needed mod A on 05/03/21    Time 8    Period Weeks    Status Revised                   Plan - 05/20/21 1405     Clinical Impression Statement Today's skilled session continued to focus on right LE stretching/strengthening, transfer training, standing balance and gait with RW/AFO. Rest breaks taken as needed due to fatigue with no other issues noted or reported in session. The pt is making steady progress and should benefit from continued PT to progress toward unmet goals. Also placed call this date to pt's NP at the [facility] to follow up on referral for an AFO and referral to get Botox for tone/spasticity, left clinic number for call back.    Personal Factors and Comorbidities Age;Time since onset of injury/illness/exacerbation    Examination-Activity Limitations Locomotion Level;Transfers;Sit;Stand;Toileting;Dressing;Hygiene/Grooming;Self Feeding;Bed Mobility;Bend;Bathing    Examination-Participation Restrictions Community Activity;Shop;Meal Prep;Yard Work    [employer]chant [employer] officer Evolving/Moderate complexity    Rehab Potential Fair    PT Frequency 2x / week    PT Duration 12 weeks    PT Treatment/Interventions ADLs/Self Care Home Management;Electrical Stimulation;DME Instruction;Gait training;Functional mobility training;Therapeutic activities;Therapeutic exercise;Balance training;Neuromuscular re-education;Manual techniques;Patient/family education;Orthotic Fit/Training;Wheelchair mobility training;Passive  range of motion;Dry needling;Taping    PT Next Visit Plan Standing tolerance in // bars or edge of mat from elevated surface. Continue to work on LE strengthening, stand pivot transfers with RW vs. squat pivot transfers, pre gait activities. LE stretching to assist with tone/spasticity management prior to any standing or transfers. continue to work  on gait with 2 person assit/Bluerocker AFO.    PT Home Exercise Plan Forward lean x20 with caregiver; standing 3x daily for at least 30 sec with caregiver; [place] 12/29/20    Consulted and Agree with Plan of Care Patient    Family Member Consulted Cargiver present during part of session.             Patient will benefit from skilled therapeutic intervention in order to improve the following deficits and impairments:  Abnormal gait, Difficulty walking, Impaired tone, Decreased range of motion, Decreased coordination, Decreased endurance, Impaired UE functional use, Decreased activity tolerance, Pain, Decreased balance, Decreased mobility, Decreased strength, Impaired sensation, Postural dysfunction, Improper body mechanics  Visit Diagnosis: Muscle weakness (generalized)  Unsteadiness on feet  Hemiplegia and hemiparesis following cerebral infarction affecting right dominant side [facility])     Problem List Patient Active Problem List   Diagnosis Date Noted   Depressive disorder 04/07/2021   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   Impacted cerumen, bilateral 04/07/2021   Unspecified hearing loss, bilateral 04/07/2021   Hemiparesis as late effect of cerebrovascular accident (CVA) ([place]) 04/02/2020   Cerebrovascular accident (CVA) ([place])    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   Hypertensive urgency 03/12/2020   Acute on chronic systolic CHF (congestive heart failure) ([place]) 03/12/2020   AKI (acute kidney injury) ([place]) 03/12/2020   Fall at home, initial encounter 03/12/2020   Right hip pain 03/12/2020   Hypokalemia 03/12/2020   Coronary artery disease involving native coronary artery of native heart without angina pectoris 09/07/2014   Renal insufficiency 12/02/2012   Preventative health care 11/13/2010   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 [address] [place], [place] [address] [phone] 05/20/21, 3:55 PM   Name: Trevor Anderson MRN: [id number] Date of Birth: 09-22-1955

## 2021-05-20 NOTE — Therapy (Signed)
[facility] [address] [place], [place], [address] Phone: [phone]   Fax:  [phone]  Occupational Therapy Treatment  Patient Details  Name: Trevor Anderson MRN: [id number] Date of Birth: January 18, 1956 Referring Provider (OT): Deland Pretty, NP   Encounter Date: 05/20/2021   OT End of Session - 05/20/21 1315     Visit Number 27    Number of Visits 30    Date for OT Re-Evaluation 05/31/21    Authorization Type VA    Authorization Time Period 30 visits approved thru 05/12/2021 (04/07/21  [id number]   15 visits   Expires 07/28/21   )    Authorization - Visit Number 27    Authorization - Number of Visits 30    Progress Note Due on Visit 30    OT Start Time 1315    OT Stop Time 1400    OT Time Calculation (min) 45 min    Activity Tolerance Patient tolerated treatment well    Behavior During Therapy [facility] for tasks assessed/performed             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   CONGESTIVE HEART FAILURE 11/12/2007   DIABETES MELLITUS, TYPE II 11/20/2008   HYPERLIPIDEMIA 11/12/2007   HYPERTENSION 11/12/2007   TACHYCARDIA 01/24/2010    Past Surgical History:  Procedure Laterality Date   BUBBLE STUDY  03/16/2020   Procedure: BUBBLE STUDY;  Surgeon: Buford Dresser, MD;  Location: [facility];  Service: Cardiovascular;;   LOOP RECORDER INSERTION N/A 03/23/2020   Procedure: LOOP RECORDER INSERTION;  Surgeon: Thompson Grayer, MD;  Location: [facility];  Service: Cardiovascular;  Laterality: N/A;   TEE WITHOUT CARDIOVERSION N/A 03/16/2020   Procedure: TRANSESOPHAGEAL ECHOCARDIOGRAM (TEE);  Surgeon: Buford Dresser, MD;  Location: [facility] ENDOSCOPY;  Service: Cardiovascular;  Laterality: N/A;   TONSILLECTOMY  1970    There were no vitals filed for this visit.   Subjective Assessment - 05/20/21 1315     Subjective  "nothing much"    Pertinent History HLD,  HTN, DM, CHF    Limitations Fall Risk. Max/Total stand pivot transfer w RW. Loop Recorder (no ESTIM)    Patient Stated Goals to move it (RUE)    Currently in Pain? No/denies    Pain Score 0-No pain             Manual Therapy/PROM to RUE at elbow, shoulder, wrist and hand  Transfer mod A for stand pivot and increased time with small BLE movements for pivot  Seated Wobble Disc for increased hip and trunk dissociation and movement, worked on postural control, anterior lean for increasing independence with transfers and lateral flexion/lean. Pt with reports of right hip pain during session and RUE shoulder pain.   Discussion with patient and patient's son regarding continuing plan of care vs discharge. OT recommends d/c at this time and returning if and when patient receives botox in RUE. Verbalized understanding.                        OT Short Term Goals - 04/19/21 1511       OT SHORT TERM GOAL #1   Title Pt and caregivers will be independent with HEP    Time 4    Period Weeks    Status Achieved    Target Date 02/09/21      OT SHORT TERM GOAL #2   Title Pt will complete UB  dressing with min A for pull over short sleeve shirt.    Baseline mod A    Time 4    Period Weeks    Status Achieved   per pt report - completing with min A for hemi arm.     OT SHORT TERM GOAL #3   Title Pt and caregiver will verbalize understanding of adapted strategies and/or equipment for completing toilet and tub transfers with mod A consistently.    Baseline currently mod/max and not getting to shower.    Time 4    Period Weeks    Status Not Met   max A for transfers and unable to fully get to tub bench and shower safely at this time and not getting on [facility] consistently at home. still using bed pan. 03/08/21     OT SHORT TERM GOAL #4   Title Pt will achieve 90* of PROM for RUE shoulder flexion to reduce risk of malpositioning and increased pain.    Time 4    Period Weeks     Status Achieved   90* with RUE shoulder flexion 03/08/21     OT SHORT TERM GOAL #5   Title Pt and caregivers will verbalize and demonstrate understanding of wear and care of any splints and/or orthoses PRN.    Time 4    Period Weeks    Status Achieved               OT Long Term Goals - 04/19/21 1512       OT LONG TERM GOAL #1   Title Pt and caregivers will be independent with and updated HEPs    Time 10    Period Weeks    Status On-going      OT LONG TERM GOAL #2   Title Pt will complete toilet and tub transfers consistently with min assistance with adapted strategies and equipment PRN    Baseline mod A for stand pivot    Time 10    Period Weeks    Status Deferred   Pt consistently mod - max transfer. 03/08/21     OT LONG TERM GOAL #3   Title Pt will complete bathing with min A    Baseline mod-max    Time 10    Period Weeks    Status On-going   simulated min A in the clinic 03/08/21 - pt able to reach 75% with washcloth while seated in tilt in space chair.     OT LONG TERM GOAL #4   Title Pt will demonstrate composite flexion/extension of 15% or greater in RUE for preparing for active grasp/release and increasing functional use.    Baseline trace extension    Time 10    Period Weeks    Status On-going   trace extension/approx 10% flexion 03/08/21     OT LONG TERM GOAL #5   Title Pt will achieve -20 degrees of elbow extension in RUE of PROM or decreasing risk of skin breakdown    Baseline -40*    Time 10    Period Weeks    Status On-going   -25* 03/08/21     OT LONG TERM GOAL #6   Title Pt and son will verbalize understanding of home modifications/equipment and/or adapted strategies for increasing independence with transfers for bathing and toileting and increasing safety and independence with ADLs/IADLs. (BSC, sponge baths vs tub bench, long handled sponge, cutting food, etc)    Time 12    Period Weeks  Status New                   Plan -  05/20/21 1451     Clinical Impression Statement Pt continues to be slow progressing with RUE NMR and spasticity management. Pt with improved standing balance today and working on postural control.    OT Occupational Profile and History Problem Focused Assessment - Including review of records relating to presenting problem    Occupational performance deficits (Please refer to evaluation for details): IADL's;ADL's;Leisure    Body Structure / Function / Physical Skills Tone;Strength;ADL;FMC;Mobility;GMC;ROM;IADL;UE functional use;Decreased knowledge of use of DME    Cognitive Skills Problem Solve    Rehab Potential Good    Clinical Decision Making Limited treatment options, no task modification necessary    Comorbidities Affecting Occupational Performance: None    Modification or Assistance to Complete Evaluation  No modification of tasks or assist necessary to complete eval    OT Frequency 2x / week    OT Duration 12 weeks   16 visits after 11/15 pending VA auth/approval   OT Treatment/Interventions Self-care/ADL training;Manual Therapy;Patient/family education;Electrical Stimulation;Neuromuscular education;Functional Mobility Training;Passive range of motion;Cognitive remediation/compensation;Therapeutic exercise;Moist Heat;DME and/or AE instruction;Therapeutic activities;Aquatic Therapy;Splinting    Plan discuss continued plan of care vs discharge at end of scheduled visits. (scheduled one visit over POC date and possibly one over [place] authorized visits currently), trunk and hip mobility for increasing independence with ADLs, RUE spasticity management    OT Home Exercise Plan self PROM    Consulted and Agree with Plan of Care Patient;Family member/caregiver    Family Member Consulted son, Trevor Anderson             Patient will benefit from skilled therapeutic intervention in order to improve the following deficits and impairments:   Body Structure / Function / Physical Skills: Tone, Strength, ADL,  FMC, Mobility, GMC, ROM, IADL, UE functional use, Decreased knowledge of use of DME Cognitive Skills: Problem Solve     Visit Diagnosis: Muscle weakness (generalized)  Abnormal posture  Unsteadiness on feet  Hemiplegia and hemiparesis following cerebral infarction affecting right dominant side (HCC)  Other lack of coordination  Attention and concentration deficit  Stiffness of right shoulder, not elsewhere classified  Stiffness of right elbow, not elsewhere classified    Problem List Patient Active Problem List   Diagnosis Date Noted   Depressive disorder 04/07/2021   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   Impacted cerumen, bilateral 04/07/2021   Unspecified hearing loss, bilateral 04/07/2021   Hemiparesis as late effect of cerebrovascular accident (CVA) ([place]) 04/02/2020   Cerebrovascular accident (CVA) ([place])    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   Hypertensive urgency 03/12/2020   Acute on chronic systolic CHF (congestive heart failure) ([facility]) 03/12/2020   AKI (acute kidney injury) ([place]) 03/12/2020   Fall at home, initial encounter 03/12/2020   Right hip pain 03/12/2020   Hypokalemia 03/12/2020   Coronary artery disease involving native coronary artery of native heart without angina pectoris 09/07/2014   Renal insufficiency 12/02/2012   Preventative health care 11/13/2010   Secondary cardiomyopathy ([place]) 01/24/2010   TACHYCARDIA 01/24/2010   Type 2 diabetes mellitus without complication, with long-term current use of insulin ([place]) 11/20/2008   Mixed hyperlipidemia 11/12/2007   Essential hypertension 11/12/2007   CONGESTIVE HEART FAILURE 11/12/2007    Zachery Conch, OT 05/20/2021, 2:52 PM  [facility] [address] [place], [place], [address] Phone: [phone]   Fax:  [phone]  Name: Trevor Anderson MRN: [id number] Date of Birth: [birth year]

## 2021-05-24 ENCOUNTER — Other Ambulatory Visit: Payer: Self-pay

## 2021-05-24 ENCOUNTER — Ambulatory Visit: Payer: No Typology Code available for payment source | Admitting: Occupational Therapy

## 2021-05-24 ENCOUNTER — Ambulatory Visit: Payer: No Typology Code available for payment source | Admitting: Physical Therapy

## 2021-05-24 ENCOUNTER — Encounter: Payer: Self-pay | Admitting: Occupational Therapy

## 2021-05-24 DIAGNOSIS — M6281 Muscle weakness (generalized): Secondary | ICD-10-CM

## 2021-05-24 DIAGNOSIS — I69351 Hemiplegia and hemiparesis following cerebral infarction affecting right dominant side: Secondary | ICD-10-CM

## 2021-05-24 DIAGNOSIS — R2681 Unsteadiness on feet: Secondary | ICD-10-CM

## 2021-05-24 DIAGNOSIS — M25611 Stiffness of right shoulder, not elsewhere classified: Secondary | ICD-10-CM

## 2021-05-24 DIAGNOSIS — M25621 Stiffness of right elbow, not elsewhere classified: Secondary | ICD-10-CM

## 2021-05-24 DIAGNOSIS — R41840 Attention and concentration deficit: Secondary | ICD-10-CM

## 2021-05-24 DIAGNOSIS — R278 Other lack of coordination: Secondary | ICD-10-CM

## 2021-05-24 NOTE — Therapy (Signed)
[facility] [address] [place], [place], [address] Phone: [phone]   Fax:  [phone]  Occupational Therapy Treatment  Patient Details  Name: Trevor Anderson MRN: [id number] Date of Birth: Sep 21, 1955 Referring Provider (OT): Deland Pretty, NP   Encounter Date: 05/24/2021   OT End of Session - 05/24/21 1320     Visit Number 28    Number of Visits 30    Date for OT Re-Evaluation 05/31/21    Authorization Type VA    Authorization Time Period 45 visits   Expires 07/28/21    Authorization - Visit Number 28    Authorization - Number of Visits 45    Progress Note Due on Visit 30    OT Start Time 1318    OT Stop Time 1400    OT Time Calculation (min) 42 min    Activity Tolerance Patient tolerated treatment well    Behavior During Therapy [facility] for tasks assessed/performed             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   CONGESTIVE HEART FAILURE 11/12/2007   DIABETES MELLITUS, TYPE II 11/20/2008   HYPERLIPIDEMIA 11/12/2007   HYPERTENSION 11/12/2007   TACHYCARDIA 01/24/2010    Past Surgical History:  Procedure Laterality Date   BUBBLE STUDY  03/16/2020   Procedure: BUBBLE STUDY;  Surgeon: Buford Dresser, MD;  Location: [facility];  Service: Cardiovascular;;   LOOP RECORDER INSERTION N/A 03/23/2020   Procedure: LOOP RECORDER INSERTION;  Surgeon: Thompson Grayer, MD;  Location: [facility];  Service: Cardiovascular;  Laterality: N/A;   TEE WITHOUT CARDIOVERSION N/A 03/16/2020   Procedure: TRANSESOPHAGEAL ECHOCARDIOGRAM (TEE);  Surgeon: Buford Dresser, MD;  Location: [facility] ENDOSCOPY;  Service: Cardiovascular;  Laterality: N/A;   TONSILLECTOMY  1970    There were no vitals filed for this visit.   Subjective Assessment - 05/24/21 1320     Subjective  "you can go warm up"    Pertinent History HLD, HTN, DM, CHF    Limitations Fall Risk. Max/Total stand  pivot transfer w RW. Loop Recorder (no ESTIM)    Patient Stated Goals to move it (RUE)    Currently in Pain? No/denies    Pain Score 0-No pain             OT was going to make modifications to splint for RUE however the splint room was in use today and could not be modified. On initial fitting this day patient had significant wrist flexion and was not able to fit into splint. After range of motion and passive stretching today, OT was able to get patient wrist in neutral.  PROM for RUE wrist and hand. A/AROM for elbow flex/extension and shoulder flexion, scap pro/retraction with minimal active and volitional movement.   Carpal Alignment and Wrist Mobilizations RUE  Transfers with mod/max assistance for getting up from chair and mat.  Bed Mobility with scooting on edge of mat with minimal scoots and movement to left.                    OT Short Term Goals - 04/19/21 1511       OT SHORT TERM GOAL #1   Title Pt and caregivers will be independent with HEP    Time 4    Period Weeks    Status Achieved    Target Date 02/09/21      OT SHORT TERM GOAL #2   Title  Pt will complete UB dressing with min A for pull over short sleeve shirt.    Baseline mod A    Time 4    Period Weeks    Status Achieved   per pt report - completing with min A for hemi arm.     OT SHORT TERM GOAL #3   Title Pt and caregiver will verbalize understanding of adapted strategies and/or equipment for completing toilet and tub transfers with mod A consistently.    Baseline currently mod/max and not getting to shower.    Time 4    Period Weeks    Status Not Met   max A for transfers and unable to fully get to tub bench and shower safely at this time and not getting on [facility] consistently at home. still using bed pan. 03/08/21     OT SHORT TERM GOAL #4   Title Pt will achieve 90* of PROM for RUE shoulder flexion to reduce risk of malpositioning and increased pain.    Time 4    Period Weeks     Status Achieved   90* with RUE shoulder flexion 03/08/21     OT SHORT TERM GOAL #5   Title Pt and caregivers will verbalize and demonstrate understanding of wear and care of any splints and/or orthoses PRN.    Time 4    Period Weeks    Status Achieved               OT Long Term Goals - 04/19/21 1512       OT LONG TERM GOAL #1   Title Pt and caregivers will be independent with and updated HEPs    Time 10    Period Weeks    Status On-going      OT LONG TERM GOAL #2   Title Pt will complete toilet and tub transfers consistently with min assistance with adapted strategies and equipment PRN    Baseline mod A for stand pivot    Time 10    Period Weeks    Status Deferred   Pt consistently mod - max transfer. 03/08/21     OT LONG TERM GOAL #3   Title Pt will complete bathing with min A    Baseline mod-max    Time 10    Period Weeks    Status On-going   simulated min A in the clinic 03/08/21 - pt able to reach 75% with washcloth while seated in tilt in space chair.     OT LONG TERM GOAL #4   Title Pt will demonstrate composite flexion/extension of 15% or greater in RUE for preparing for active grasp/release and increasing functional use.    Baseline trace extension    Time 10    Period Weeks    Status On-going   trace extension/approx 10% flexion 03/08/21     OT LONG TERM GOAL #5   Title Pt will achieve -20 degrees of elbow extension in RUE of PROM or decreasing risk of skin breakdown    Baseline -40*    Time 10    Period Weeks    Status On-going   -25* 03/08/21     OT LONG TERM GOAL #6   Title Pt and son will verbalize understanding of home modifications/equipment and/or adapted strategies for increasing independence with transfers for bathing and toileting and increasing safety and independence with ADLs/IADLs. (BSC, sponge baths vs tub bench, long handled sponge, cutting food, etc)    Time 12  Period Weeks    Status New                   Plan -  05/24/21 1356     Clinical Impression Statement Significant spasticity and tone in RUE impeding overall progress with NMR and range of motion.    OT Occupational Profile and History Problem Focused Assessment - Including review of records relating to presenting problem    Occupational performance deficits (Please refer to evaluation for details): IADL's;ADL's;Leisure    Body Structure / Function / Physical Skills Tone;Strength;ADL;FMC;Mobility;GMC;ROM;IADL;UE functional use;Decreased knowledge of use of DME    Cognitive Skills Problem Solve    Rehab Potential Good    Clinical Decision Making Limited treatment options, no task modification necessary    Comorbidities Affecting Occupational Performance: None    Modification or Assistance to Complete Evaluation  No modification of tasks or assist necessary to complete eval    OT Frequency 2x / week    OT Duration 12 weeks   16 visits after 11/15 pending VA auth/approval   OT Treatment/Interventions Self-care/ADL training;Manual Therapy;Patient/family education;Electrical Stimulation;Neuromuscular education;Functional Mobility Training;Passive range of motion;Cognitive remediation/compensation;Therapeutic exercise;Moist Heat;DME and/or AE instruction;Therapeutic activities;Aquatic Therapy;Splinting    Plan discuss continued plan of care vs discharge at end of scheduled visits. (scheduled one visit over POC ),  trunk and hip mobility for increasing independence with ADLs, RUE spasticity management    OT Home Exercise Plan self PROM    Consulted and Agree with Plan of Care Patient;Family member/caregiver    Family Member Consulted son, [place]             Patient will benefit from skilled therapeutic intervention in order to improve the following deficits and impairments:   Body Structure / Function / Physical Skills: Tone, Strength, ADL, FMC, Mobility, GMC, ROM, IADL, UE functional use, Decreased knowledge of use of DME Cognitive Skills: Problem  Solve     Visit Diagnosis: Muscle weakness (generalized)  Unsteadiness on feet  Hemiplegia and hemiparesis following cerebral infarction affecting right dominant side (HCC)  Other lack of coordination  Attention and concentration deficit  Stiffness of right shoulder, not elsewhere classified  Stiffness of right elbow, not elsewhere classified    Problem List Patient Active Problem List   Diagnosis Date Noted   Depressive disorder 04/07/2021   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   Impacted cerumen, bilateral 04/07/2021   Unspecified hearing loss, bilateral 04/07/2021   Hemiparesis as late effect of cerebrovascular accident (CVA) ([place]) 04/02/2020   Cerebrovascular accident (CVA) ([place])    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   Hypertensive urgency 03/12/2020   Acute on chronic systolic CHF (congestive heart failure) ([place]) 03/12/2020   AKI (acute kidney injury) ([place]) 03/12/2020   Fall at home, initial encounter 03/12/2020   Right hip pain 03/12/2020   Hypokalemia 03/12/2020   Coronary artery disease involving native coronary artery of native heart without angina pectoris 09/07/2014   Renal insufficiency 12/02/2012   Preventative health care 11/13/2010   Secondary cardiomyopathy ([place]) 01/24/2010   TACHYCARDIA 01/24/2010   Type 2 diabetes mellitus without complication, with long-term current use of insulin ([place]) 11/20/2008   Mixed hyperlipidemia 11/12/2007   Essential hypertension 11/12/2007   CONGESTIVE HEART FAILURE 11/12/2007    Zachery Conch, OT 05/24/2021, 2:06 PM  [facility] [address] [place], [place], [address] Phone: [phone]   Fax:  [phone]  Name: Trevor Anderson MRN: [id number] Date of Birth: 1956-01-31

## 2021-05-26 ENCOUNTER — Encounter: Payer: Self-pay | Admitting: Physical Therapy

## 2021-05-26 ENCOUNTER — Encounter: Payer: Self-pay | Admitting: Occupational Therapy

## 2021-05-26 ENCOUNTER — Ambulatory Visit: Payer: No Typology Code available for payment source | Attending: Adult Health | Admitting: Physical Therapy

## 2021-05-26 ENCOUNTER — Ambulatory Visit: Payer: No Typology Code available for payment source | Admitting: Occupational Therapy

## 2021-05-26 ENCOUNTER — Other Ambulatory Visit: Payer: Self-pay

## 2021-05-26 DIAGNOSIS — I69351 Hemiplegia and hemiparesis following cerebral infarction affecting right dominant side: Secondary | ICD-10-CM | POA: Insufficient documentation

## 2021-05-26 DIAGNOSIS — R2681 Unsteadiness on feet: Secondary | ICD-10-CM | POA: Diagnosis present

## 2021-05-26 DIAGNOSIS — R293 Abnormal posture: Secondary | ICD-10-CM

## 2021-05-26 DIAGNOSIS — M6281 Muscle weakness (generalized): Secondary | ICD-10-CM

## 2021-05-26 DIAGNOSIS — R41840 Attention and concentration deficit: Secondary | ICD-10-CM

## 2021-05-26 DIAGNOSIS — M25611 Stiffness of right shoulder, not elsewhere classified: Secondary | ICD-10-CM

## 2021-05-26 DIAGNOSIS — M25621 Stiffness of right elbow, not elsewhere classified: Secondary | ICD-10-CM | POA: Diagnosis present

## 2021-05-26 DIAGNOSIS — R278 Other lack of coordination: Secondary | ICD-10-CM | POA: Insufficient documentation

## 2021-05-26 NOTE — Therapy (Signed)
[facility] [address] [place], [place], [address] Phone: [phone]   Fax:  [phone]  Occupational Therapy Treatment and Recertificaton  Patient Details  Name: Trevor Anderson MRN: [id number] Date of Birth: 08-Apr-1956 Referring Provider (OT): Deland Pretty, NP   Encounter Date: 05/26/2021   OT End of Session - 05/26/21 1642     Visit Number 29    Number of Visits 34    Date for OT Re-Evaluation 07/01/21    Authorization Type VA    Authorization Time Period 45 visits   Expires 07/28/21    Authorization - Visit Number 30    Authorization - Number of Visits 45    Progress Note Due on Visit 30    OT Start Time 1400    OT Stop Time 1445    OT Time Calculation (min) 45 min    Activity Tolerance Patient tolerated treatment well    Behavior During Therapy [facility] for tasks assessed/performed             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   CONGESTIVE HEART FAILURE 11/12/2007   DIABETES MELLITUS, TYPE II 11/20/2008   HYPERLIPIDEMIA 11/12/2007   HYPERTENSION 11/12/2007   TACHYCARDIA 01/24/2010    Past Surgical History:  Procedure Laterality Date   BUBBLE STUDY  03/16/2020   Procedure: BUBBLE STUDY;  Surgeon: Buford Dresser, MD;  Location: [facility];  Service: Cardiovascular;;   LOOP RECORDER INSERTION N/A 03/23/2020   Procedure: LOOP RECORDER INSERTION;  Surgeon: Thompson Grayer, MD;  Location: [facility] CV [facility];  Service: Cardiovascular;  Laterality: N/A;   TEE WITHOUT CARDIOVERSION N/A 03/16/2020   Procedure: TRANSESOPHAGEAL ECHOCARDIOGRAM (TEE);  Surgeon: Buford Dresser, MD;  Location: [facility] ENDOSCOPY;  Service: Cardiovascular;  Laterality: N/A;   TONSILLECTOMY  1970    There were no vitals filed for this visit.   Subjective Assessment - 05/26/21 1634     Subjective  Patient brought splint and indicated that strapping was needing repair    Patient is  accompanied by: --   caregiver   Pertinent History HLD, HTN, DM, CHF    Limitations Fall Risk. Max/Total stand pivot transfer w RW. Loop Recorder (no ESTIM)    Currently in Pain? No/denies    Pain Score 0-No pain                          OT Treatments/Exercises (OP) - 05/26/21 1635       Splinting   Splinting Fabricated custom circumferential wrist orthosis to align carpals and stretch wrist out of full wrist flexion.  Patient with very tight hand (flexion) as well, however during the day it may be too strenuous to stretch both wrist and finger flexors, and patient has been hanging hand over the edge of the armrest further promoting carpal malalignment.  Patient's caregiver present, and demonstrated awareness of wearing schedule - 1 hour today then monitor for any redness/pressure areas that do not clear in a few minutes.  Caregiver able to don and doff brace with min verbal cueing.  New strapping material / velcro provided for resting hand splint.  Patient able to provide self range of motion to digits while supporting carpal alignment in wrist brace.  Patient may benefit from serial splints to promote greater degrees of wrist extension, and possibly a cuff fo fingers to promote a finger stren=tch with wrist maintained in decent alignment.  Discussed with primary OT.  OT Education - 05/26/21 1641     Education Details Wear splint for up to 1 hour thenn remove and assess any pressure areas.  If pressure areas are present do not put splint back on - bring in for revision.    Person(s) Educated Patient;Caregiver(s)    Methods Explanation    Comprehension Verbalized understanding;Need further instruction              OT Short Term Goals - 05/26/21 1645       OT SHORT TERM GOAL #1   Title Pt and caregivers will be independent with HEP    Time 4    Period Weeks    Status Achieved    Target Date 02/09/21      OT SHORT TERM GOAL #2   Title  Pt will complete UB dressing with min A for pull over short sleeve shirt.    Baseline mod A    Time 4    Period Weeks    Status Achieved   per pt report - completing with min A for hemi arm.     OT SHORT TERM GOAL #3   Title Pt and caregiver will verbalize understanding of adapted strategies and/or equipment for completing toilet and tub transfers with mod A consistently.    Baseline currently mod/max and not getting to shower.    Time 4    Period Weeks    Status Not Met   max A for transfers and unable to fully get to tub bench and shower safely at this time and not getting on [facility] consistently at home. still using bed pan. 03/08/21     OT SHORT TERM GOAL #4   Title Pt will achieve 90* of PROM for RUE shoulder flexion to reduce risk of malpositioning and increased pain.    Time 4    Period Weeks    Status Achieved   90* with RUE shoulder flexion 03/08/21     OT SHORT TERM GOAL #5   Title Pt and caregivers will verbalize and demonstrate understanding of wear and care of any splints and/or orthoses PRN.    Time 4    Period Weeks    Status Achieved               OT Long Term Goals - 05/26/21 1645       OT LONG TERM GOAL #1   Title Pt and caregivers will be independent with and updated HEPs    Time 10    Period Weeks    Status On-going      OT LONG TERM GOAL #2   Title Pt will complete toilet and tub transfers consistently with min assistance with adapted strategies and equipment PRN    Baseline mod A for stand pivot    Time 10    Period Weeks    Status Deferred   Pt consistently mod - max transfer. 03/08/21     OT LONG TERM GOAL #3   Title Pt will complete bathing with min A    Baseline mod-max    Time 10    Period Weeks    Status On-going   simulated min A in the clinic 03/08/21 - pt able to reach 75% with washcloth while seated in tilt in space chair.     OT LONG TERM GOAL #4   Title Pt will demonstrate composite flexion/extension of 15% or greater in RUE for  preparing for active grasp/release and increasing functional use.    Baseline  trace extension    Time 10    Period Weeks    Status On-going   trace extension/approx 10% flexion 03/08/21     OT LONG TERM GOAL #5   Title Pt will achieve -20 degrees of elbow extension in RUE of PROM or decreasing risk of skin breakdown    Baseline -40*    Time 10    Period Weeks    Status On-going   -25* 03/08/21     OT LONG TERM GOAL #6   Title Pt and son will verbalize understanding of home modifications/equipment and/or adapted strategies for increasing independence with transfers for bathing and toileting and increasing safety and independence with ADLs/IADLs. (BSC, sponge baths vs tub bench, long handled sponge, cutting food, etc)    Time 12    Period Weeks    Status On-going                   Plan - 05/26/21 1643     Clinical Impression Statement Patient has shown improvement overall with functional mobility (walking) however is severely limited by spasticity and resultant tightness in Right side.  Will recertify this plan of care for an additional 4 weeks at 1x/week to help address RUE positioning needs and remining OT functional goals.    OT Occupational Profile and History Problem Focused Assessment - Including review of records relating to presenting problem    Occupational performance deficits (Please refer to evaluation for details): IADL's;ADL's;Leisure    Body Structure / Function / Physical Skills Tone;Strength;ADL;FMC;Mobility;GMC;ROM;IADL;UE functional use;Decreased knowledge of use of DME    Cognitive Skills Problem Solve    Rehab Potential Good    Clinical Decision Making Limited treatment options, no task modification necessary    Comorbidities Affecting Occupational Performance: None    Modification or Assistance to Complete Evaluation  No modification of tasks or assist necessary to complete eval    OT Frequency 2x / week    OT Duration 12 weeks   16 visits after 11/15  pending VA auth/approval   OT Treatment/Interventions Self-care/ADL training;Manual Therapy;Patient/family education;Electrical Stimulation;Neuromuscular education;Functional Mobility Training;Passive range of motion;Cognitive remediation/compensation;Therapeutic exercise;Moist Heat;DME and/or AE instruction;Therapeutic activities;Aquatic Therapy;Splinting    Plan discuss continued plan of care vs discharge at end of scheduled visits. (scheduled one visit over POC ),  trunk and hip mobility for increasing independence with ADLs, RUE spasticity management    OT Home Exercise Plan self PROM    Consulted and Agree with Plan of Care Patient;Family member/caregiver             Patient will benefit from skilled therapeutic intervention in order to improve the following deficits and impairments:   Body Structure / Function / Physical Skills: Tone, Strength, ADL, FMC, Mobility, GMC, ROM, IADL, UE functional use, Decreased knowledge of use of DME Cognitive Skills: Problem Solve     Visit Diagnosis: Hemiplegia and hemiparesis following cerebral infarction affecting right dominant side ([place]) - Plan: Ot plan of care cert/re-cert  Abnormal posture - Plan: Ot plan of care cert/re-cert  Muscle weakness (generalized) - Plan: Ot plan of care cert/re-cert  Unsteadiness on feet - Plan: Ot plan of care cert/re-cert  Other lack of coordination - Plan: Ot plan of care cert/re-cert  Stiffness of right shoulder, not elsewhere classified - Plan: Ot plan of care cert/re-cert  Stiffness of right elbow, not elsewhere classified - Plan: Ot plan of care cert/re-cert  Attention and concentration deficit - Plan: Ot plan of care cert/re-cert  Problem List Patient Active Problem List   Diagnosis Date Noted   Depressive disorder 04/07/2021   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   Impacted cerumen, bilateral 04/07/2021   Unspecified hearing loss, bilateral 04/07/2021   Hemiparesis as late effect of  cerebrovascular accident (CVA) ([place]) 04/02/2020   Cerebrovascular accident (CVA) ([place])    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   Hypertensive urgency 03/12/2020   Acute on chronic systolic CHF (congestive heart failure) ([place]) 03/12/2020   AKI (acute kidney injury) ([place]) 03/12/2020   Fall at home, initial encounter 03/12/2020   Right hip pain 03/12/2020   Hypokalemia 03/12/2020   Coronary artery disease involving native coronary artery of native heart without angina pectoris 09/07/2014   Renal insufficiency 12/02/2012   Preventative health care 11/13/2010   Secondary cardiomyopathy ([place]) 01/24/2010   TACHYCARDIA 01/24/2010   Type 2 diabetes mellitus without complication, with long-term current use of insulin ([place]) 11/20/2008   Mixed hyperlipidemia 11/12/2007   Essential hypertension 11/12/2007   CONGESTIVE HEART FAILURE 11/12/2007    Mariah Milling, OT 05/26/2021, 4:51 PM  [facility] [address] [place], [place], [address] Phone: [phone]   Fax:  [phone]  Name: JORGE RETZ MRN: [id number] Date of Birth: [birth year]

## 2021-05-27 NOTE — Therapy (Signed)
[facility] [address] [place], [place], [address] Phone: [phone]   Fax:  [phone]  Physical Therapy Treatment  Patient Details  Name: Trevor Anderson MRN: [id number] Date of Birth: 1956/04/21 Referring Provider (PT): Deland Pretty   Encounter Date: 05/26/2021   PT End of Session - 05/26/21 1448     Visit Number 34    Number of Visits 42    Date for PT Re-Evaluation 08/03/21    Authorization Type VA auth# [id number] (15 visits); VA has approved additional 15 PT visits to 05/26/2021; 15 visits through 08/03/21    Authorization - Visit Number 5    Authorization - Number of Visits 15    PT Start Time 7078    PT Stop Time 1528    PT Time Calculation (min) 42 min    Equipment Utilized During Treatment Gait belt;Other (comment)   AFO   Activity Tolerance Patient tolerated treatment well    Behavior During Therapy [facility] for tasks assessed/performed             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   CONGESTIVE HEART FAILURE 11/12/2007   DIABETES MELLITUS, TYPE II 11/20/2008   HYPERLIPIDEMIA 11/12/2007   HYPERTENSION 11/12/2007   TACHYCARDIA 01/24/2010    Past Surgical History:  Procedure Laterality Date   BUBBLE STUDY  03/16/2020   Procedure: BUBBLE STUDY;  Surgeon: Buford Dresser, MD;  Location: [facility];  Service: Cardiovascular;;   LOOP RECORDER INSERTION N/A 03/23/2020   Procedure: LOOP RECORDER INSERTION;  Surgeon: Thompson Grayer, MD;  Location: [place] [facility];  Service: Cardiovascular;  Laterality: N/A;   TEE WITHOUT CARDIOVERSION N/A 03/16/2020   Procedure: TRANSESOPHAGEAL ECHOCARDIOGRAM (TEE);  Surgeon: Buford Dresser, MD;  Location: [facility] ENDOSCOPY;  Service: Cardiovascular;  Laterality: N/A;   TONSILLECTOMY  1970    There were no vitals filed for this visit.   Subjective Assessment - 05/26/21 1447     Subjective No new complaints. No  falls or pain. Some right shoulder pain.    Patient is accompained by: --   caregiver/CNA   Limitations Standing    How long can you stand comfortably? 5-10 minutes    Patient Stated Goals Open/close hand; Get more R LE motion;    Currently in Pain? Yes    Pain Score 2     Pain Location Shoulder    Pain Orientation Right    Pain Descriptors / Indicators Aching;Sore    Pain Type Chronic pain    Pain Onset More than a month ago    Pain Frequency Intermittent    Aggravating Factors  certain movements of right arm    Pain Relieving Factors resting, stretching                05/26/21 1451  Transfers  Transfers Sit to Stand;Stand to Sit  Sit to Stand 3: Mod assist;4: Min assist;From elevated surface;With upper extremity assist;From bed;From chair/3-in-1  Sit to Stand Details Verbal cues for sequencing;Verbal cues for technique;Verbal cues for precautions/safety;Verbal cues for safe use of DME/AE  Sit to Stand Details (indicate cue type and reason) reminder cues for hand placement and forward weight shifting  Stand to Sit 3: Mod assist;With upper extremity assist;To elevated surface;To bed;To chair/3-in-1;Uncontrolled descent  Stand to Sit Details (indicate cue type and reason) Verbal cues for sequencing;Verbal cues for technique  Stand to Sit Details reminder cues to reach back with left UE for controlled sitting  Stand  Pivot Transfers 4: Min assist;3: Mod assist  Stand Pivot Transfer Details (indicate cue type and reason) with RW from wheelchair to mat table with cues on posture, sequencing and position in walker. assitance needed to advance right LE toward mat table.  Ambulation/Gait  Ambulation/Gait Yes  Ambulation/Gait Assistance 4: Min assist;3: Mod assist (of 2 therapists with chair follow from tech for 1st rep; one therapist with tech stand by with chair follow witih min/mod assist this rep.)  Ambulation/Gait Assistance Details 1st gait rep with 2 person min assist with chair  follow. 2cd gait rep with 1 person min/mod assist with chair follow. cues with all gait for posture, right knee extension in stance and increased left step length. assist/facilitation for weight shifitng with pt self managing the walker.  Ambulation Distance (Feet) 40 Feet (x1, 30 x1)  Assistive device Rolling walker;Other (Comment)  Gait Pattern Step-to pattern;Decreased stride length;Decreased step length - right;Decreased step length - left;Decreased stance time - right;Decreased hip/knee flexion - right;Decreased dorsiflexion - right;Decreased weight shift to right;Right flexed knee in stance;Decreased trunk rotation;Narrow base of support;Poor foot clearance - right  Ambulation Surface Level;Indoor  Self-Care  Self-Care Other Self-Care Comments  Other Self-Care Comments  discussed HEP and standing at home. Pt currently standing only 2-3 days a week. Encouraged pt to work toward dialy stands for strengthening, postural training and weight bearing on LE's. Pt verbalized understanding.  Exercises  Exercises Other Exercises  Other Exercises  seated in wheelchair: right heel cord stretching, hamstring stretching and hip adductor stretching passively for 1-2 minute holds each.       PT Short Term Goals - 05/26/21 1448       PT SHORT TERM GOAL #1   Title Pt and caregiver will report standing at least 1x a day at the sink for improved standing tolerance. ALL STGS DUE 05/31/21    Baseline 05/26/21: </= 3 days a week for ~10 minutes before needing to sit down to rest.    Status Partially Met    Target Date --      PT SHORT TERM GOAL #2   Title Pt will perform sit to stand transfer with min A from mat table with RW.    Baseline 05/27/21: conmtinues to fluctuate between min/mod assist    Time --    Period --    Status Not Met      PT SHORT TERM GOAL #3   Title Pt will perform squat pivot transfer from w/c <> mat table with mod A in order to demo decr caregiver burden.    Baseline 05/27/21: min/mod  assist for squat or stand pivot with RW    Time --    Period --    Status Achieved      PT SHORT TERM GOAL #4   Title Pt will ambulate at least 25' with RW with min A +2 in order to demo improved functional mobility.    Baseline 05/27/21: met in session    Time --    Period --    Status Achieved                      PT Long Term Goals - 05/17/21 1212       PT LONG TERM GOAL #1   Title Pt will be able to perform progression of/finalized HEP and perform standing consistently at home with caregiver assist,  TARGET 06/28/21    Baseline pt's son reports working on exercises at home with  caregiver - pt will benefit from ongoing revisions/additions, pt not performing standing at home with caregivers.    Time 8    Period Weeks    Status On-going    Target Date 06/28/21      PT LONG TERM GOAL #2   Title Pt will be able to perform all edge of bed scooting with min A for improved ease of transfers, decreased caregiver burden    Baseline pt continues to require cues and min assist to laterally scoot to L, and mod assist and cues to laterally scoot to R    Time 8    Period Weeks    Status On-going      PT LONG TERM GOAL #3   Title Caregiver will report standing at sink or RW at home, for at least 5 minutes, at least 5 days per week, with min guard>supervision, for improved standing tolerance for ADL participation.    Baseline inconsistent standing at home    Time 8    Period Weeks    Status On-going      PT LONG TERM GOAL #4   Title Pt will be able to perform wheelchair transfer squat pivot with min A for improved independence vs. sliding board transfer with min guard, in order to decrease caregiver burden    Baseline pt needing mod A to min A with stand pivot transfer with RW, depends on the day.    Time 8    Period Weeks    Status Revised      PT LONG TERM GOAL #5   Title Pt will ambulate at least 22' with RW with mod A x1 in order to demo improved functional mobility.     Baseline last assessed on 04/21/21 with pt able to ambulate 35-40 x1' with min/mod A x2 therapists with w/c follow    Time 8    Period Weeks    Status Revised    Target Date 06/28/21      PT LONG TERM GOAL #6   Title Pt will perform sit <> stands with RW consistently with min A from w/c and mat table in order to demo improved functional transfers to decr caregiver burden.    Baseline needed mod A on 05/03/21    Time 8    Period Weeks    Status Revised               05/26/21 1448  Plan  Clinical Impression Statement Today's skilled session focused on progress toward STGs. Pt did not meet his sit<>stand goal or dialy standing goals. All other goals partially to fully met. The pt is making progress toward goals and should benefit from continued PT to progress toward unmet goals.  Personal Factors and Comorbidities Age;Time since onset of injury/illness/exacerbation  Examination-Activity Limitations Locomotion Level;Transfers;Sit;Stand;Toileting;Dressing;Hygiene/Grooming;Self Feeding;Bed Mobility;Bend;Bathing  Examination-Participation Restrictions Community Activity;Shop;Meal Prep;Yard Work  Pt will benefit from skilled therapeutic intervention in order to improve on the following deficits Abnormal gait;Difficulty walking;Impaired tone;Decreased range of motion;Decreased coordination;Decreased endurance;Impaired UE functional use;Decreased activity tolerance;Pain;Decreased balance;Decreased mobility;Decreased strength;Impaired sensation;Postural dysfunction;Improper body mechanics  Stability/Clinical Decision Making Evolving/Moderate complexity  Rehab Potential Fair  PT Frequency 2x / week  PT Duration 12 weeks  PT Treatment/Interventions ADLs/Self Care Home Management;Electrical Stimulation;DME Instruction;Gait training;Functional mobility training;Therapeutic activities;Therapeutic exercise;Balance training;Neuromuscular re-education;Manual techniques;Patient/family education;Orthotic  Fit/Training;Wheelchair mobility training;Passive range of motion;Dry needling;Taping  PT Next Visit Plan continued to work on right LE stretching and strengthening; standing balance/tolerance, gait with RW with 1 person assist/tech for standby  and wheelchair follow.  PT Home Exercise Plan Forward lean x20 with caregiver; standing 3x daily for at least 30 sec with caregiver; [place] 12/29/20  Consulted and Agree with Plan of Care Patient  Family Member Consulted [place] present during part of session.          Patient will benefit from skilled therapeutic intervention in order to improve the following deficits and impairments:  Abnormal gait, Difficulty walking, Impaired tone, Decreased range of motion, Decreased coordination, Decreased endurance, Impaired UE functional use, Decreased activity tolerance, Pain, Decreased balance, Decreased mobility, Decreased strength, Impaired sensation, Postural dysfunction, Improper body mechanics  Visit Diagnosis: Muscle weakness (generalized)  Unsteadiness on feet  Hemiplegia and hemiparesis following cerebral infarction affecting right dominant side [facility])     Problem List Patient Active Problem List   Diagnosis Date Noted   Depressive disorder 04/07/2021   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   Impacted cerumen, bilateral 04/07/2021   Unspecified hearing loss, bilateral 04/07/2021   Hemiparesis as late effect of cerebrovascular accident (CVA) ([place]) 04/02/2020   Cerebrovascular accident (CVA) ([place])    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   Hypertensive urgency 03/12/2020   Acute on chronic systolic CHF (congestive heart failure) ([place]) 03/12/2020   AKI (acute kidney injury) ([place]) 03/12/2020   Fall at home, initial encounter 03/12/2020   Right hip pain 03/12/2020   Hypokalemia 03/12/2020   Coronary artery disease involving native coronary artery of native heart without angina pectoris 09/07/2014   Renal insufficiency  12/02/2012   Preventative health care 11/13/2010   Secondary cardiomyopathy ([place]) 01/24/2010   TACHYCARDIA 01/24/2010   Type 2 diabetes mellitus without complication, with long-term current use of insulin ([place]) 11/20/2008   Mixed hyperlipidemia 11/12/2007   Essential hypertension 11/12/2007   CONGESTIVE HEART FAILURE 11/12/2007    Willow Ora, PTA, [facility] Outpatient Neuro [facility] [address], [place], [place] [address] [phone] 05/28/21, 3:15 PM   Name: Trevor Anderson MRN: [id number] Date of Birth: [birth year]

## 2021-05-28 NOTE — Progress Notes (Signed)
°   05/26/21 1451  Transfers  Transfers Sit to Stand;Stand to Sit  Sit to Stand 3: Mod assist;4: Min assist;From elevated surface;With upper extremity assist;From bed;From chair/3-in-1  Sit to Stand Details Verbal cues for sequencing;Verbal cues for technique;Verbal cues for precautions/safety;Verbal cues for safe use of DME/AE  Sit to Stand Details (indicate cue type and reason) reminder cues for hand placement and forward weight shifting  Stand to Sit 3: Mod assist;With upper extremity assist;To elevated surface;To bed;To chair/3-in-1;Uncontrolled descent  Stand to Sit Details (indicate cue type and reason) Verbal cues for sequencing;Verbal cues for technique  Stand to Sit Details reminder cues to reach back with left UE for controlled sitting  Ambulation/Gait  Ambulation/Gait Yes  Ambulation/Gait Assistance 4: Min assist;3: Mod assist (of 2 therapists with chair follow from tech for 1st rep; one therapist with tech stand by with chair follow witih min/mod assist this rep.)  Ambulation/Gait Assistance Details 1st gait rep with 2 person min assist with chair follow. 2cd gait rep with 1 person min/mod assist with chair follow. cues with all gait for posture, right knee extension in stance and increased left step length. assist/facilitation for weight shifitng with pt self managing the walker.  Ambulation Distance (Feet) 40 Feet (x1, 30 x1)  Assistive device Rolling walker;Other (Comment)  Gait Pattern Step-to pattern;Decreased stride length;Decreased step length - right;Decreased step length - left;Decreased stance time - right;Decreased hip/knee flexion - right;Decreased dorsiflexion - right;Decreased weight shift to right;Right flexed knee in stance;Decreased trunk rotation;Narrow base of support;Poor foot clearance - right  Ambulation Surface Level;Indoor  Self-Care  Self-Care Other Self-Care Comments  Other Self-Care Comments  discussed HEP and standing at home. Pt currently standing only 2-3  days a week. Encouraged pt to work toward dialy stands for strengthening, postural training and weight bearing on LE's. Pt verbalized understanding.  Exercises  Exercises Other Exercises  Other Exercises  seated in wheelchair: right heel cord stretching, hamstring stretching and hip adductor stretching passively for 1-2 minute holds each.

## 2021-05-31 ENCOUNTER — Encounter: Payer: Self-pay | Admitting: Occupational Therapy

## 2021-05-31 ENCOUNTER — Other Ambulatory Visit: Payer: Self-pay

## 2021-05-31 ENCOUNTER — Ambulatory Visit: Payer: No Typology Code available for payment source | Admitting: Occupational Therapy

## 2021-05-31 ENCOUNTER — Ambulatory Visit: Payer: No Typology Code available for payment source | Admitting: Physical Therapy

## 2021-05-31 ENCOUNTER — Encounter: Payer: Self-pay | Admitting: Physical Therapy

## 2021-05-31 VITALS — BP 130/82 | HR 103

## 2021-05-31 DIAGNOSIS — M6281 Muscle weakness (generalized): Secondary | ICD-10-CM

## 2021-05-31 DIAGNOSIS — R293 Abnormal posture: Secondary | ICD-10-CM

## 2021-05-31 DIAGNOSIS — M25611 Stiffness of right shoulder, not elsewhere classified: Secondary | ICD-10-CM

## 2021-05-31 DIAGNOSIS — R2681 Unsteadiness on feet: Secondary | ICD-10-CM

## 2021-05-31 DIAGNOSIS — I69351 Hemiplegia and hemiparesis following cerebral infarction affecting right dominant side: Secondary | ICD-10-CM

## 2021-05-31 DIAGNOSIS — R41840 Attention and concentration deficit: Secondary | ICD-10-CM

## 2021-05-31 DIAGNOSIS — R278 Other lack of coordination: Secondary | ICD-10-CM

## 2021-05-31 DIAGNOSIS — M25621 Stiffness of right elbow, not elsewhere classified: Secondary | ICD-10-CM

## 2021-05-31 NOTE — Therapy (Signed)
[facility] [address] [place], [place], [address] Phone: [phone]   Fax:  [phone]  Occupational Therapy Treatment  Patient Details  Name: Trevor Anderson MRN: [id number] Date of Birth: Dec 14, 1955 Referring Provider (OT): Deland Pretty, NP   Encounter Date: 05/31/2021   OT End of Session - 05/31/21 1404     Visit Number 30    Number of Visits 34    Date for OT Re-Evaluation 46/65/99   per certification   Authorization Type VA    Authorization Time Period 45 visits   Expires 07/28/21    Authorization - Visit Number 53    Authorization - Number of Visits 45    Progress Note Due on Visit 45    OT Start Time 1400    OT Stop Time 1445    OT Time Calculation (min) 45 min    Activity Tolerance Patient tolerated treatment well    Behavior During Therapy [facility] for tasks assessed/performed             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   CONGESTIVE HEART FAILURE 11/12/2007   DIABETES MELLITUS, TYPE II 11/20/2008   HYPERLIPIDEMIA 11/12/2007   HYPERTENSION 11/12/2007   TACHYCARDIA 01/24/2010    Past Surgical History:  Procedure Laterality Date   BUBBLE STUDY  03/16/2020   Procedure: BUBBLE STUDY;  Surgeon: Buford Dresser, MD;  Location: [facility];  Service: Cardiovascular;;   LOOP RECORDER INSERTION N/A 03/23/2020   Procedure: LOOP RECORDER INSERTION;  Surgeon: Thompson Grayer, MD;  Location: [place] [facility];  Service: Cardiovascular;  Laterality: N/A;   TEE WITHOUT CARDIOVERSION N/A 03/16/2020   Procedure: TRANSESOPHAGEAL ECHOCARDIOGRAM (TEE);  Surgeon: Buford Dresser, MD;  Location: [facility] ENDOSCOPY;  Service: Cardiovascular;  Laterality: N/A;   TONSILLECTOMY  1970    Vitals:   05/31/21 1403  BP: 130/82  Pulse: (!) 103     Subjective Assessment - 05/31/21 1403     Subjective  Pt reports feeling tired.    Patient is accompanied by: --   caregiver    Pertinent History HLD, HTN, DM, CHF    Limitations Fall Risk. Max/Total stand pivot transfer w RW. Loop Recorder (no ESTIM)    Patient Stated Goals to move it (RUE)    Currently in Pain? Yes    Pain Score 2     Pain Location Arm    Pain Descriptors / Indicators Aching    Pain Type Chronic pain    Pain Onset More than a month ago    Pain Frequency Constant              A/AROM and PROM w RUE. Pt demonstrating some trace activation with supination and horizontal abduction and scap protraction/reaching. Pt benefiting from significant PROM for wrist and hand extension in RUE.  Standing tolerance with standing with rolling walker and AFO on RLE. Pt able to maintain standing balance with BUE support with SBA for approximately 30-45 seconds before requesting to sit. Pt completed again for 90 seconds with intermittent CGA for safety.    Transfers with AFO on RLE and with rolling walker with mod A for lifting assistance and min A for stand pivot.                    OT Short Term Goals - 05/26/21 1645       OT SHORT TERM GOAL #1   Title Pt and caregivers will be independent  with HEP    Time 4    Period Weeks    Status Achieved    Target Date 02/09/21      OT SHORT TERM GOAL #2   Title Pt will complete UB dressing with min A for pull over short sleeve shirt.    Baseline mod A    Time 4    Period Weeks    Status Achieved   per pt report - completing with min A for hemi arm.     OT SHORT TERM GOAL #3   Title Pt and caregiver will verbalize understanding of adapted strategies and/or equipment for completing toilet and tub transfers with mod A consistently.    Baseline currently mod/max and not getting to shower.    Time 4    Period Weeks    Status Not Met   max A for transfers and unable to fully get to tub bench and shower safely at this time and not getting on [facility] consistently at home. still using bed pan. 03/08/21     OT SHORT TERM GOAL #4   Title Pt will  achieve 90* of PROM for RUE shoulder flexion to reduce risk of malpositioning and increased pain.    Time 4    Period Weeks    Status Achieved   90* with RUE shoulder flexion 03/08/21     OT SHORT TERM GOAL #5   Title Pt and caregivers will verbalize and demonstrate understanding of wear and care of any splints and/or orthoses PRN.    Time 4    Period Weeks    Status Achieved               OT Long Term Goals - 05/26/21 1645       OT LONG TERM GOAL #1   Title Pt and caregivers will be independent with and updated HEPs    Time 10    Period Weeks    Status On-going      OT LONG TERM GOAL #2   Title Pt will complete toilet and tub transfers consistently with min assistance with adapted strategies and equipment PRN    Baseline mod A for stand pivot    Time 10    Period Weeks    Status Deferred   Pt consistently mod - max transfer. 03/08/21     OT LONG TERM GOAL #3   Title Pt will complete bathing with min A    Baseline mod-max    Time 10    Period Weeks    Status On-going   simulated min A in the clinic 03/08/21 - pt able to reach 75% with washcloth while seated in tilt in space chair.     OT LONG TERM GOAL #4   Title Pt will demonstrate composite flexion/extension of 15% or greater in RUE for preparing for active grasp/release and increasing functional use.    Baseline trace extension    Time 10    Period Weeks    Status On-going   trace extension/approx 10% flexion 03/08/21     OT LONG TERM GOAL #5   Title Pt will achieve -20 degrees of elbow extension in RUE of PROM or decreasing risk of skin breakdown    Baseline -40*    Time 10    Period Weeks    Status On-going   -25* 03/08/21     OT LONG TERM GOAL #6   Title Pt and son will verbalize understanding of home modifications/equipment and/or adapted strategies  for increasing independence with transfers for bathing and toileting and increasing safety and independence with ADLs/IADLs. (BSC, sponge baths vs tub bench,  long handled sponge, cutting food, etc)    Time 12    Period Weeks    Status On-going                   Plan - 05/31/21 1413     Clinical Impression Statement Patient has shown improvement overall with functional mobility (walking) however is severely limited by spasticity and resultant tightness in Right side.  Will recertify this plan of care for an additional 4 weeks at 1x/week to help address RUE positioning needs and remining OT functional goals.    OT Occupational Profile and History Problem Focused Assessment - Including review of records relating to presenting problem    Occupational performance deficits (Please refer to evaluation for details): IADL's;ADL's;Leisure    Body Structure / Function / Physical Skills Tone;Strength;ADL;FMC;Mobility;GMC;ROM;IADL;UE functional use;Decreased knowledge of use of DME    Cognitive Skills Problem Solve    Rehab Potential Good    Clinical Decision Making Limited treatment options, no task modification necessary    Comorbidities Affecting Occupational Performance: None    Modification or Assistance to Complete Evaluation  No modification of tasks or assist necessary to complete eval    OT Frequency 1x / week    OT Duration 6 weeks   1x/week for 6 weeks at recertification last visit (05/26/21)   OT Treatment/Interventions Self-care/ADL training;Manual Therapy;Patient/family education;Electrical Stimulation;Neuromuscular education;Functional Mobility Training;Passive range of motion;Cognitive remediation/compensation;Therapeutic exercise;Moist Heat;DME and/or AE instruction;Therapeutic activities;Aquatic Therapy;Splinting    Plan trunk and hip mobility for increasing independence with ADLs, RUE spasticity management    OT Home Exercise Plan self PROM    Consulted and Agree with Plan of Care Patient;Family member/caregiver             Patient will benefit from skilled therapeutic intervention in order to improve the following deficits and  impairments:   Body Structure / Function / Physical Skills: Tone, Strength, ADL, FMC, Mobility, GMC, ROM, IADL, UE functional use, Decreased knowledge of use of DME Cognitive Skills: Problem Solve     Visit Diagnosis: Hemiplegia and hemiparesis following cerebral infarction affecting right dominant side (HCC)  Abnormal posture  Muscle weakness (generalized)  Unsteadiness on feet  Other lack of coordination  Stiffness of right elbow, not elsewhere classified  Stiffness of right shoulder, not elsewhere classified  Attention and concentration deficit    Problem List Patient Active Problem List   Diagnosis Date Noted   Depressive disorder 04/07/2021   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   Impacted cerumen, bilateral 04/07/2021   Unspecified hearing loss, bilateral 04/07/2021   Hemiparesis as late effect of cerebrovascular accident (CVA) ([place]) 04/02/2020   Cerebrovascular accident (CVA) ([place])    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   Hypertensive urgency 03/12/2020   Acute on chronic systolic CHF (congestive heart failure) ([place]) 03/12/2020   AKI (acute kidney injury) ([place]) 03/12/2020   Fall at home, initial encounter 03/12/2020   Right hip pain 03/12/2020   Hypokalemia 03/12/2020   Coronary artery disease involving native coronary artery of native heart without angina pectoris 09/07/2014   Renal insufficiency 12/02/2012   Preventative health care 11/13/2010   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

## 2021-05-31 NOTE — Therapy (Signed)
[facility] [address] [place], [place], [address] Phone: [phone]   Fax:  [phone]  Physical Therapy Treatment  Patient Details  Name: Trevor Anderson MRN: [id number] Date of Birth: [birth year] Referring Provider (PT): Deland Pretty   Encounter Date: 05/31/2021   PT End of Session - 05/31/21 1321     Visit Number 35    Number of Visits 83    Date for PT Re-Evaluation 08/03/21    Authorization Type VA auth# [id number] (15 visits); VA has approved additional 15 PT visits to 05/26/2021; 15 visits through 08/03/21    Authorization - Visit Number 6    Authorization - Number of Visits 15    PT Start Time 5093    PT Stop Time 2671    PT Time Calculation (min) 40 min    Equipment Utilized During Treatment Gait belt;Other (comment)   AFO   Activity Tolerance Patient tolerated treatment well    Behavior During Therapy [facility] for tasks assessed/performed             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   CONGESTIVE HEART FAILURE 11/12/2007   DIABETES MELLITUS, TYPE II 11/20/2008   HYPERLIPIDEMIA 11/12/2007   HYPERTENSION 11/12/2007   TACHYCARDIA 01/24/2010    Past Surgical History:  Procedure Laterality Date   BUBBLE STUDY  03/16/2020   Procedure: BUBBLE STUDY;  Surgeon: Buford Dresser, MD;  Location: [facility];  Service: Cardiovascular;;   LOOP RECORDER INSERTION N/A 03/23/2020   Procedure: LOOP RECORDER INSERTION;  Surgeon: Thompson Grayer, MD;  Location: [facility];  Service: Cardiovascular;  Laterality: N/A;   TEE WITHOUT CARDIOVERSION N/A 03/16/2020   Procedure: TRANSESOPHAGEAL ECHOCARDIOGRAM (TEE);  Surgeon: Buford Dresser, MD;  Location: [facility] ENDOSCOPY;  Service: Cardiovascular;  Laterality: N/A;   TONSILLECTOMY  1970    There were no vitals filed for this visit.   Subjective Assessment - 05/31/21 1319     Subjective No new complaints.No  falls. Shoulder pain today. Goes to [employer] on [address] on 06/10/21 for shoe lifts.    Patient is accompained by: --   caregiver/CNA   Limitations Standing    How long can you stand comfortably? 5-10 minutes    Patient Stated Goals Open/close hand; Get more R LE motion;    Currently in Pain? No/denies    Pain Score 0-No pain                     [facility] Adult PT Treatment/Exercise - 05/31/21 1322       Transfers   Transfers Sit to Stand;Stand to Sit    Sit to Stand 3: Mod assist;With upper extremity assist;From chair/3-in-1    Sit to Stand Details (indicate cue type and reason) mod assist to stand wheelchair>RW x2, min assist from mat>RW x1. cues each time for weight shifting and hand placement.    Stand to Sit 3: Mod assist;With upper extremity assist;To elevated surface;To bed;To chair/3-in-1;Uncontrolled descent    Stand to Sit Details (indicate cue type and reason) Verbal cues for sequencing;Verbal cues for technique    Stand to Sit Details reminder cues to reach back each time with sitting to mat table x2, then wheelchair x1, most assistance for controlled descent    Stand Pivot Transfers 4: Min assist;3: Mod assist    Stand Pivot Transfer Details (indicate cue type and reason) with RW from wheelchair to mat table. assist needed to advance right LE,  pt moving left LE/walker. cues/assist for posture and balance.      Ambulation/Gait   Ambulation/Gait Yes    Ambulation/Gait Assistance 4: Min assist;3: Mod assist   2 therapist assist today with CNA bringing wheelchair   Ambulation/Gait Assistance Details 1st gait rep with 2 person assistance needed. max to total assist to advance right LE. Pt self advancing left LE and walker. cues/facilitation needed for upright posture, weight shifitng and sequencing with gait.    Ambulation Distance (Feet) 30 Feet   x1, 15 x1 with 90* turn   Assistive device Rolling walker;Other (Comment)    Gait Pattern Step-to pattern;Decreased stride  length;Decreased step length - right;Decreased step length - left;Decreased stance time - right;Decreased hip/knee flexion - right;Decreased dorsiflexion - right;Decreased weight shift to right;Right flexed knee in stance;Decreased trunk rotation;Narrow base of support;Poor foot clearance - right    Ambulation Surface Level;Indoor      Exercises   Exercises Other Exercises    Other Exercises  seated in wheelchair at start of session: passive stretching for right heel cords, hamstrings and hip adductors for 1-2 minutes each for decreased tone/spasticity;  seated at edge of mat table: with foot on pillowcase for heel slides with assistance needed for 10 reps, then long arc quads for 10 reps with assitance (trace muscle activation noted with both exercises).                    PT Short Term Goals - 05/26/21 1448       PT SHORT TERM GOAL #1   Title Pt and caregiver will report standing at least 1x a day at the sink for improved standing tolerance. ALL STGS DUE 05/31/21    Baseline 05/26/21: </= 3 days a week for ~10 minutes before needing to sit down to rest.    Status Partially Met    Target Date --      PT SHORT TERM GOAL #2   Title Pt will perform sit to stand transfer with min A from mat table with RW.    Baseline 05/27/21: conmtinues to fluctuate between min/mod assist    Time --    Period --    Status Not Met      PT SHORT TERM GOAL #3   Title Pt will perform squat pivot transfer from w/c <> mat table with mod A in order to demo decr caregiver burden.    Baseline 05/27/21: min/mod assist for squat or stand pivot with RW    Time --    Period --    Status Achieved      PT SHORT TERM GOAL #4   Title Pt will ambulate at least 20' with RW with min A +2 in order to demo improved functional mobility.    Baseline 05/27/21: met in session    Time --    Period --    Status Achieved               PT Long Term Goals - 05/17/21 1212       PT LONG TERM GOAL #1   Title Pt will be  able to perform progression of/finalized HEP and perform standing consistently at home with caregiver assist,  TARGET 06/28/21    Baseline pt's son reports working on exercises at home with caregiver - pt will benefit from ongoing revisions/additions, pt not performing standing at home with caregivers.    Time 8    Period Weeks    Status On-going  Target Date 06/28/21      PT LONG TERM GOAL #2   Title Pt will be able to perform all edge of bed scooting with min A for improved ease of transfers, decreased caregiver burden    Baseline pt continues to require cues and min assist to laterally scoot to L, and mod assist and cues to laterally scoot to R    Time 8    Period Weeks    Status On-going      PT LONG TERM GOAL #3   Title Caregiver will report standing at sink or RW at home, for at least 5 minutes, at least 5 days per week, with min guard>supervision, for improved standing tolerance for ADL participation.    Baseline inconsistent standing at home    Time 8    Period Weeks    Status On-going      PT LONG TERM GOAL #4   Title Pt will be able to perform wheelchair transfer squat pivot with min A for improved independence vs. sliding board transfer with min guard, in order to decrease caregiver burden    Baseline pt needing mod A to min A with stand pivot transfer with RW, depends on the day.    Time 8    Period Weeks    Status Revised      PT LONG TERM GOAL #5   Title Pt will ambulate at least 25' with RW with mod A x1 in order to demo improved functional mobility.    Baseline last assessed on 04/21/21 with pt able to ambulate 35-40 x1' with min/mod A x2 therapists with w/c follow    Time 8    Period Weeks    Status Revised    Target Date 06/28/21      PT LONG TERM GOAL #6   Title Pt will perform sit <> stands with RW consistently with min A from w/c and mat table in order to demo improved functional transfers to decr caregiver burden.    Baseline needed mod A on 05/03/21     Time 8    Period Weeks    Status Revised                   Plan - 05/31/21 1322     Clinical Impression Statement Today's skilled session continued to focus on LE stretching/strengthening, transfer training and gait with RW/AFO. Pt with increased tone/spasticity in right LE this session needing increased assistance wtih gait. Was able to progress to pt performing pivot turn to sit after gait for short distance this session. The pt has an appointement with [facility] on [address] next week for heel lifts (?), will need to call and verity it's for an AFO and possibly see if they wish to come here due to the assistance required for gait. The pt should benefit from continued PT to progress toward unmet goals.    Personal Factors and Comorbidities Age;Time since onset of injury/illness/exacerbation    Examination-Activity Limitations Locomotion Level;Transfers;Sit;Stand;Toileting;Dressing;Hygiene/Grooming;Self Feeding;Bed Mobility;Bend;Bathing    Examination-Participation Restrictions Community Activity;Shop;Meal Prep;Yard Work    [employer] Evolving/Moderate complexity    Rehab Potential Fair    PT Frequency 2x / week    PT Duration 12 weeks    PT Treatment/Interventions ADLs/Self Care Home Management;Electrical Stimulation;DME Instruction;Gait training;Functional mobility training;Therapeutic activities;Therapeutic exercise;Balance training;Neuromuscular re-education;Manual techniques;Patient/family education;Orthotic Fit/Training;Wheelchair mobility training;Passive range of motion;Dry needling;Taping    PT Next Visit Plan continued to work on right LE stretching and strengthening; standing  balance/tolerance, gait with RW with 1 person assist/tech for standby and wheelchair follow/ 2 person assist with tone is strong    PT Home Exercise Plan Forward lean x20 with caregiver; standing 3x daily for at least 30 sec with caregiver; [place] 12/29/20    Consulted and Agree with  Plan of Care Patient    Family Member Consulted Cargiver present during part of session.             Patient will benefit from skilled therapeutic intervention in order to improve the following deficits and impairments:  Abnormal gait, Difficulty walking, Impaired tone, Decreased range of motion, Decreased coordination, Decreased endurance, Impaired UE functional use, Decreased activity tolerance, Pain, Decreased balance, Decreased mobility, Decreased strength, Impaired sensation, Postural dysfunction, Improper body mechanics  Visit Diagnosis: Hemiplegia and hemiparesis following cerebral infarction affecting right dominant side (HCC)  Muscle weakness (generalized)  Unsteadiness on feet     Problem List Patient Active Problem List   Diagnosis Date Noted   Depressive disorder 04/07/2021   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   Impacted cerumen, bilateral 04/07/2021   Unspecified hearing loss, bilateral 04/07/2021   Hemiparesis as late effect of cerebrovascular accident (CVA) ([place]) 04/02/2020   Cerebrovascular accident (CVA) ([place])    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   Hypertensive urgency 03/12/2020   Acute on chronic systolic CHF (congestive heart failure) ([place]) 03/12/2020   AKI (acute kidney injury) ([place]) 03/12/2020   Fall at home, initial encounter 03/12/2020   Right hip pain 03/12/2020   Hypokalemia 03/12/2020   Coronary artery disease involving native coronary artery of native heart without angina pectoris 09/07/2014   Renal insufficiency 12/02/2012   Preventative health care 11/13/2010   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) 11/20/2008   Mixed hyperlipidemia 11/12/2007   Essential hypertension 11/12/2007   CONGESTIVE HEART FAILURE 11/12/2007   Willow Ora, PTA, [facility] [address] [place], [place] [address] [phone] 05/31/21, 9:55 PM   Name: Trevor Anderson MRN: [id number] Date of Birth: 21-Mar-1956

## 2021-06-02 ENCOUNTER — Encounter: Payer: Self-pay | Admitting: Physical Therapy

## 2021-06-02 ENCOUNTER — Ambulatory Visit: Payer: No Typology Code available for payment source | Admitting: Physical Therapy

## 2021-06-02 ENCOUNTER — Other Ambulatory Visit: Payer: Self-pay

## 2021-06-02 ENCOUNTER — Ambulatory Visit: Payer: No Typology Code available for payment source | Admitting: Occupational Therapy

## 2021-06-02 DIAGNOSIS — R293 Abnormal posture: Secondary | ICD-10-CM

## 2021-06-02 DIAGNOSIS — M6281 Muscle weakness (generalized): Secondary | ICD-10-CM

## 2021-06-02 DIAGNOSIS — R2681 Unsteadiness on feet: Secondary | ICD-10-CM

## 2021-06-02 DIAGNOSIS — I69351 Hemiplegia and hemiparesis following cerebral infarction affecting right dominant side: Secondary | ICD-10-CM

## 2021-06-03 NOTE — Therapy (Signed)
[facility] [address] [place], [place], [address] Phone: [phone]   Fax:  [phone]  Physical Therapy Treatment  Patient Details  Name: Trevor Anderson MRN: [id number] Date of Birth: Sep 23, 1955 Referring Provider (PT): Deland Pretty   Encounter Date: 06/02/2021   PT End of Session - 06/02/21 1322     Visit Number 36    Number of Visits 60    Date for PT Re-Evaluation 08/03/21    Authorization Type VA auth# [id number] (15 visits); VA has approved additional 15 PT visits to 05/26/2021; 15 visits through 08/03/21    Authorization - Visit Number 7    Authorization - Number of Visits 15    PT Start Time 0923    PT Stop Time 1400    PT Time Calculation (min) 42 min    Equipment Utilized During Treatment Gait belt;Other (comment)   AFO   Activity Tolerance Patient tolerated treatment well    Behavior During Therapy [facility] for tasks assessed/performed             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   CONGESTIVE HEART FAILURE 11/12/2007   DIABETES MELLITUS, TYPE II 11/20/2008   HYPERLIPIDEMIA 11/12/2007   HYPERTENSION 11/12/2007   TACHYCARDIA 01/24/2010    Past Surgical History:  Procedure Laterality Date   BUBBLE STUDY  03/16/2020   Procedure: BUBBLE STUDY;  Surgeon: Buford Dresser, MD;  Location: [facility];  Service: Cardiovascular;;   LOOP RECORDER INSERTION N/A 03/23/2020   Procedure: LOOP RECORDER INSERTION;  Surgeon: Thompson Grayer, MD;  Location: [facility];  Service: Cardiovascular;  Laterality: N/A;   TEE WITHOUT CARDIOVERSION N/A 03/16/2020   Procedure: TRANSESOPHAGEAL ECHOCARDIOGRAM (TEE);  Surgeon: Buford Dresser, MD;  Location: [facility] ENDOSCOPY;  Service: Cardiovascular;  Laterality: N/A;   TONSILLECTOMY  1970    There were no vitals filed for this visit.   Subjective Assessment - 06/02/21 1321     Subjective No new complaitns. No  falls. Having right shoulder pain.    Limitations Standing    How long can you stand comfortably? 5-10 minutes    Patient Stated Goals Open/close hand; Get more R LE motion;    Currently in Pain? Yes    Pain Score 2     Pain Location Arm    Pain Orientation Right    Pain Descriptors / Indicators Aching    Pain Type Chronic pain    Pain Onset More than a month ago    Pain Frequency Constant    Aggravating Factors  certain movements of right arm    Pain Relieving Factors resting, stretching              06/02/21 0001  Transfers  Transfers Sit to Stand;Stand to Sit  Sit to Stand 3: Mod assist;With upper extremity assist;From chair/3-in-1  Stand to Sit 3: Mod assist;With upper extremity assist;To elevated surface;To bed;To chair/3-in-1;Uncontrolled descent  Stand Pivot Transfers 3: Mod assist  Stand Pivot Transfer Details (indicate cue type and reason) with RW from wheelchair to mat table min/mod assist with pt self advancing walker and "wiggle stepping" left LE, tone causing right LE to advance.  Exercises  Exercises Other Exercises  Other Exercises  seated in wheelchair: passive stretching of right heel cord, hamstrings and hip adductors for decreased tone/spasticity with standing/transfers; on mat table with use of powder board: in supine working on heel slides with assistance needed. pt able to activate muscle contractions however  does not have enough strength to facilitate movements against tone. then in left sidelying with right leg on powder board: bloc practice working on hip/knee flexion/extension, then working on just knee flexion/extension. decreased assistance needed as reps progressed.                                PT Short Term Goals - 05/26/21 1448       PT SHORT TERM GOAL #1   Title Pt and caregiver will report standing at least 1x a day at the sink for improved standing tolerance. ALL STGS DUE 05/31/21    Baseline 05/26/21: </= 3 days a week  for ~10 minutes before needing to sit down to rest.    Status Partially Met    Target Date --      PT SHORT TERM GOAL #2   Title Pt will perform sit to stand transfer with min A from mat table with RW.    Baseline 05/27/21: conmtinues to fluctuate between min/mod assist    Time --    Period --    Status Not Met      PT SHORT TERM GOAL #3   Title Pt will perform squat pivot transfer from w/c <> mat table with mod A in order to demo decr caregiver burden.    Baseline 05/27/21: min/mod assist for squat or stand pivot with RW    Time --    Period --    Status Achieved      PT SHORT TERM GOAL #4   Title Pt will ambulate at least 44' with RW with min A +2 in order to demo improved functional mobility.    Baseline 05/27/21: met in session    Time --    Period --    Status Achieved               PT Long Term Goals - 05/17/21 1212       PT LONG TERM GOAL #1   Title Pt will be able to perform progression of/finalized HEP and perform standing consistently at home with caregiver assist,  TARGET 06/28/21    Baseline pt's son reports working on exercises at home with caregiver - pt will benefit from ongoing revisions/additions, pt not performing standing at home with caregivers.    Time 8    Period Weeks    Status On-going    Target Date 06/28/21      PT LONG TERM GOAL #2   Title Pt will be able to perform all edge of bed scooting with min A for improved ease of transfers, decreased caregiver burden    Baseline pt continues to require cues and min assist to laterally scoot to L, and mod assist and cues to laterally scoot to R    Time 8    Period Weeks    Status On-going      PT LONG TERM GOAL #3   Title Caregiver will report standing at sink or RW at home, for at least 5 minutes, at least 5 days per week, with min guard>supervision, for improved standing tolerance for ADL participation.    Baseline inconsistent standing at home    Time 8    Period Weeks    Status On-going      PT  LONG TERM GOAL #4   Title Pt will be able to perform wheelchair transfer squat pivot with min A for improved independence vs. sliding  board transfer with min guard, in order to decrease caregiver burden    Baseline pt needing mod A to min A with stand pivot transfer with RW, depends on the day.    Time 8    Period Weeks    Status Revised      PT LONG TERM GOAL #5   Title Pt will ambulate at least 26' with RW with mod A x1 in order to demo improved functional mobility.    Baseline last assessed on 04/21/21 with pt able to ambulate 35-40 x1' with min/mod A x2 therapists with w/c follow    Time 8    Period Weeks    Status Revised    Target Date 06/28/21      PT LONG TERM GOAL #6   Title Pt will perform sit <> stands with RW consistently with min A from w/c and mat table in order to demo improved functional transfers to decr caregiver burden.    Baseline needed mod A on 05/03/21    Time 8    Period Weeks    Status Revised                   Plan - 06/02/21 1323     Clinical Impression Statement Today's skilled session focused on transfers and right LE strengthening with no issues noted or reported. The pt continues to have significant tone/spasticity in right LE and UE. Did note increased muscle activation of quads today.  The pt should benefit from continued PT to progress toward unmet goals.    Personal Factors and Comorbidities Age;Time since onset of injury/illness/exacerbation    Examination-Activity Limitations Locomotion Level;Transfers;Sit;Stand;Toileting;Dressing;Hygiene/Grooming;Self Feeding;Bed Mobility;Bend;Bathing    Examination-Participation Restrictions Community Activity;Shop;Meal Prep;Yard Work    [employer] Evolving/Moderate complexity    Rehab Potential Fair    PT Frequency 2x / week    PT Duration 12 weeks    PT Treatment/Interventions ADLs/Self Care Home Management;Electrical Stimulation;DME Instruction;Gait training;Functional  mobility training;Therapeutic activities;Therapeutic exercise;Balance training;Neuromuscular re-education;Manual techniques;Patient/family education;Orthotic Fit/Training;Wheelchair mobility training;Passive range of motion;Dry needling;Taping    PT Next Visit Plan ? NMES to quads concurrent with ex's (need to confirm no contraindications, it is in the POC) continued to work on right LE stretching and strengthening; standing balance/tolerance, gait with RW with 1 person assist/tech for standby and wheelchair follow/ 2 person assist with tone is strong    PT Home Exercise Plan Forward lean x20 with caregiver; standing 3x daily for at least 30 sec with caregiver; [place] 12/29/20    Consulted and Agree with Plan of Care Patient    Family Member Consulted Cargiver present during part of session.             Patient will benefit from skilled therapeutic intervention in order to improve the following deficits and impairments:  Abnormal gait, Difficulty walking, Impaired tone, Decreased range of motion, Decreased coordination, Decreased endurance, Impaired UE functional use, Decreased activity tolerance, Pain, Decreased balance, Decreased mobility, Decreased strength, Impaired sensation, Postural dysfunction, Improper body mechanics  Visit Diagnosis: Hemiplegia and hemiparesis following cerebral infarction affecting right dominant side (HCC)  Abnormal posture  Muscle weakness (generalized)  Unsteadiness on feet     Problem List Patient Active Problem List   Diagnosis Date Noted   Depressive disorder 04/07/2021   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   Impacted cerumen, bilateral 04/07/2021   Unspecified hearing loss, bilateral 04/07/2021   Hemiparesis as late effect of cerebrovascular accident (CVA) ([place]) 04/02/2020   Cerebrovascular accident (CVA) ([place])  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   Hypertensive urgency 03/12/2020   Acute on chronic systolic CHF  (congestive heart failure) ([place]) 03/12/2020   AKI (acute kidney injury) ([place]) 03/12/2020   Fall at home, initial encounter 03/12/2020   Right hip pain 03/12/2020   Hypokalemia 03/12/2020   Coronary artery disease involving native coronary artery of native heart without angina pectoris 09/07/2014   Renal insufficiency 12/02/2012   Preventative health care 11/13/2010   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

## 2021-06-07 ENCOUNTER — Encounter: Payer: Self-pay | Admitting: Occupational Therapy

## 2021-06-07 ENCOUNTER — Other Ambulatory Visit: Payer: Self-pay

## 2021-06-07 ENCOUNTER — Ambulatory Visit: Payer: No Typology Code available for payment source | Admitting: Occupational Therapy

## 2021-06-07 ENCOUNTER — Ambulatory Visit: Payer: No Typology Code available for payment source

## 2021-06-07 DIAGNOSIS — R2681 Unsteadiness on feet: Secondary | ICD-10-CM

## 2021-06-07 DIAGNOSIS — I69351 Hemiplegia and hemiparesis following cerebral infarction affecting right dominant side: Secondary | ICD-10-CM

## 2021-06-07 DIAGNOSIS — R41840 Attention and concentration deficit: Secondary | ICD-10-CM

## 2021-06-07 DIAGNOSIS — M25611 Stiffness of right shoulder, not elsewhere classified: Secondary | ICD-10-CM

## 2021-06-07 DIAGNOSIS — M25621 Stiffness of right elbow, not elsewhere classified: Secondary | ICD-10-CM

## 2021-06-07 DIAGNOSIS — M6281 Muscle weakness (generalized): Secondary | ICD-10-CM

## 2021-06-07 DIAGNOSIS — R278 Other lack of coordination: Secondary | ICD-10-CM

## 2021-06-07 NOTE — Therapy (Signed)
[facility] [address] [place], [place], [address] Phone: [phone]   Fax:  [phone]  Occupational Therapy Treatment  Patient Details  Name: Trevor Anderson MRN: [id number] Date of Birth: [birth year] Referring Provider (OT): Deland Pretty, NP   Encounter Date: 06/07/2021   OT End of Session - 06/07/21 1541     Visit Number 31    Number of Visits 34    Date for OT Re-Evaluation 64/40/34   per certification   Authorization Type VA    Authorization Time Period 45 visits   Expires 07/28/21    Authorization - Visit Number 31    Authorization - Number of Visits 45    Progress Note Due on Visit 50    OT Start Time 1537   arrival   OT Stop Time 1615    OT Time Calculation (min) 38 min    Activity Tolerance Patient tolerated treatment well    Behavior During Therapy [facility] for tasks assessed/performed             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   CONGESTIVE HEART FAILURE 11/12/2007   DIABETES MELLITUS, TYPE II 11/20/2008   HYPERLIPIDEMIA 11/12/2007   HYPERTENSION 11/12/2007   TACHYCARDIA 01/24/2010    Past Surgical History:  Procedure Laterality Date   BUBBLE STUDY  03/16/2020   Procedure: BUBBLE STUDY;  Surgeon: Buford Dresser, MD;  Location: [facility];  Service: Cardiovascular;;   LOOP RECORDER INSERTION N/A 03/23/2020   Procedure: LOOP RECORDER INSERTION;  Surgeon: [place] Grayer, MD;  Location: [place] [facility];  Service: Cardiovascular;  Laterality: N/A;   TEE WITHOUT CARDIOVERSION N/A 03/16/2020   Procedure: TRANSESOPHAGEAL ECHOCARDIOGRAM (TEE);  Surgeon: Buford Dresser, MD;  Location: [facility] ENDOSCOPY;  Service: Cardiovascular;  Laterality: N/A;   TONSILLECTOMY  1970    There were no vitals filed for this visit.   Subjective Assessment - 06/07/21 1539     Subjective  "terrible - my eagles lost" Caregiver reports they are going to pop in to do  the drug test at the house sometime soon.    Patient is accompanied by: --   caregiver   Pertinent History HLD, HTN, DM, CHF    Limitations Fall Risk. Max/Total stand pivot transfer w RW. Loop Recorder (no ESTIM)    Patient Stated Goals to move it (RUE)    Currently in Pain? Yes    Pain Score 1     Pain Location Shoulder    Pain Orientation Right    Pain Descriptors / Indicators Aching    Pain Type Chronic pain    Pain Onset More than a month ago    Pain Frequency Constant                   OT Treatments/Exercises (OP) - 06/07/21 0001       Neurological Re-education Exercises   Other Exercises 1 worked on positioing at edge of mat with trunk rotation, hip mobility and working on anterior lean. Pt with significant spasticity and tightness with RUE and R hip impeding overall trunk rotation and hip/trunk dissociation.      Manual Therapy   Manual Therapy Passive ROM    Passive ROM RUE                      OT Short Term Goals - 05/26/21 1645       OT SHORT TERM GOAL #1   Title  Pt and caregivers will be independent with HEP    Time 4    Period Weeks    Status Achieved    Target Date 02/09/21      OT SHORT TERM GOAL #2   Title Pt will complete UB dressing with min A for pull over short sleeve shirt.    Baseline mod A    Time 4    Period Weeks    Status Achieved   per pt report - completing with min A for hemi arm.     OT SHORT TERM GOAL #3   Title Pt and caregiver will verbalize understanding of adapted strategies and/or equipment for completing toilet and tub transfers with mod A consistently.    Baseline currently mod/max and not getting to shower.    Time 4    Period Weeks    Status Not Met   max A for transfers and unable to fully get to tub bench and shower safely at this time and not getting on [facility] consistently at home. still using bed pan. 03/08/21     OT SHORT TERM GOAL #4   Title Pt will achieve 90* of PROM for RUE shoulder flexion to  reduce risk of malpositioning and increased pain.    Time 4    Period Weeks    Status Achieved   90* with RUE shoulder flexion 03/08/21     OT SHORT TERM GOAL #5   Title Pt and caregivers will verbalize and demonstrate understanding of wear and care of any splints and/or orthoses PRN.    Time 4    Period Weeks    Status Achieved               OT Long Term Goals - 05/26/21 1645       OT LONG TERM GOAL #1   Title Pt and caregivers will be independent with and updated HEPs    Time 10    Period Weeks    Status On-going      OT LONG TERM GOAL #2   Title Pt will complete toilet and tub transfers consistently with min assistance with adapted strategies and equipment PRN    Baseline mod A for stand pivot    Time 10    Period Weeks    Status Deferred   Pt consistently mod - max transfer. 03/08/21     OT LONG TERM GOAL #3   Title Pt will complete bathing with min A    Baseline mod-max    Time 10    Period Weeks    Status On-going   simulated min A in the clinic 03/08/21 - pt able to reach 75% with washcloth while seated in tilt in space chair.     OT LONG TERM GOAL #4   Title Pt will demonstrate composite flexion/extension of 15% or greater in RUE for preparing for active grasp/release and increasing functional use.    Baseline trace extension    Time 10    Period Weeks    Status On-going   trace extension/approx 10% flexion 03/08/21     OT LONG TERM GOAL #5   Title Pt will achieve -20 degrees of elbow extension in RUE of PROM or decreasing risk of skin breakdown    Baseline -40*    Time 10    Period Weeks    Status On-going   -25* 03/08/21     OT LONG TERM GOAL #6   Title Pt and son will verbalize understanding  of home modifications/equipment and/or adapted strategies for increasing independence with transfers for bathing and toileting and increasing safety and independence with ADLs/IADLs. (BSC, sponge baths vs tub bench, long handled sponge, cutting food, etc)     Time 12    Period Weeks    Status On-going                   Plan - 06/07/21 1548     Clinical Impression Statement Pt continues to be limited by spasticity and tightness in RUE and hip.    OT Occupational Profile and History Problem Focused Assessment - Including review of records relating to presenting problem    Occupational performance deficits (Please refer to evaluation for details): IADL's;ADL's;Leisure    Body Structure / Function / Physical Skills Tone;Strength;ADL;FMC;Mobility;GMC;ROM;IADL;UE functional use;Decreased knowledge of use of DME    Cognitive Skills Problem Solve    Rehab Potential Good    Clinical Decision Making Limited treatment options, no task modification necessary    Comorbidities Affecting Occupational Performance: None    Modification or Assistance to Complete Evaluation  No modification of tasks or assist necessary to complete eval    OT Frequency 1x / week    OT Duration 6 weeks   1x/week for 6 weeks at recertification last visit (05/26/21)   OT Treatment/Interventions Self-care/ADL training;Manual Therapy;Patient/family education;Electrical Stimulation;Neuromuscular education;Functional Mobility Training;Passive range of motion;Cognitive remediation/compensation;Therapeutic exercise;Moist Heat;DME and/or AE instruction;Therapeutic activities;Aquatic Therapy;Splinting    Plan trunk and hip mobility for increasing independence with ADLs, RUE spasticity management    OT Home Exercise Plan self PROM    Consulted and Agree with Plan of Care Patient;Family member/caregiver             Patient will benefit from skilled therapeutic intervention in order to improve the following deficits and impairments:   Body Structure / Function / Physical Skills: Tone, Strength, ADL, FMC, Mobility, GMC, ROM, IADL, UE functional use, Decreased knowledge of use of DME Cognitive Skills: Problem Solve     Visit Diagnosis: Hemiplegia and hemiparesis following  cerebral infarction affecting right dominant side (HCC)  Muscle weakness (generalized)  Unsteadiness on feet  Other lack of coordination  Stiffness of right elbow, not elsewhere classified  Stiffness of right shoulder, not elsewhere classified  Attention and concentration deficit    Problem List Patient Active Problem List   Diagnosis Date Noted   Depressive disorder 04/07/2021   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   Impacted cerumen, bilateral 04/07/2021   Unspecified hearing loss, bilateral 04/07/2021   Hemiparesis as late effect of cerebrovascular accident (CVA) ([place]) 04/02/2020   Cerebrovascular accident (CVA) ([place])    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   Hypertensive urgency 03/12/2020   Acute on chronic systolic CHF (congestive heart failure) ([place]) 03/12/2020   AKI (acute kidney injury) ([place]) 03/12/2020   Fall at home, initial encounter 03/12/2020   Right hip pain 03/12/2020   Hypokalemia 03/12/2020   Coronary artery disease involving native coronary artery of native heart without angina pectoris 09/07/2014   Renal insufficiency 12/02/2012   Preventative health care 11/13/2010   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 [place], [place], [address] Phone: [phone]   Fax:  [phone]  Name: PADRAIC MARINOS MRN: [id number] Date of Birth: 04-29-1955

## 2021-06-07 NOTE — Therapy (Signed)
[facility] [address] [place], [place], [address] Phone: [phone]   Fax:  [phone]  Physical Therapy Treatment  Patient Details  Name: Trevor Anderson MRN: [id number] Date of Birth: 1956-04-04 Referring Provider (PT): Deland Pretty   Encounter Date: 06/07/2021   PT End of Session - 06/07/21 1618     Visit Number 37    Number of Visits 22    Date for PT Re-Evaluation 08/03/21    Authorization Type VA auth# [id number] (15 visits); VA has approved additional 15 PT visits to 05/26/2021; 15 visits through 08/03/21    Authorization - Visit Number 8    Authorization - Number of Visits 15    Equipment Utilized During Treatment Gait belt;Other (comment)   AFO   Activity Tolerance Patient tolerated treatment well    Behavior During Therapy [facility] for tasks assessed/performed             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   CONGESTIVE HEART FAILURE 11/12/2007   DIABETES MELLITUS, TYPE II 11/20/2008   HYPERLIPIDEMIA 11/12/2007   HYPERTENSION 11/12/2007   TACHYCARDIA 01/24/2010    Past Surgical History:  Procedure Laterality Date   BUBBLE STUDY  03/16/2020   Procedure: BUBBLE STUDY;  Surgeon: Buford Dresser, MD;  Location: [facility];  Service: Cardiovascular;;   LOOP RECORDER INSERTION N/A 03/23/2020   Procedure: LOOP RECORDER INSERTION;  Surgeon: Thompson Grayer, MD;  Location: [facility] [facility];  Service: Cardiovascular;  Laterality: N/A;   TEE WITHOUT CARDIOVERSION N/A 03/16/2020   Procedure: TRANSESOPHAGEAL ECHOCARDIOGRAM (TEE);  Surgeon: Buford Dresser, MD;  Location: [facility] ENDOSCOPY;  Service: Cardiovascular;  Laterality: N/A;   TONSILLECTOMY  1970    There were no vitals filed for this visit.   Subjective Assessment - 06/07/21 1618     Subjective Pt reports no new falls. Shoulder pain is 1-2/10    Limitations Standing    How long can you stand  comfortably? 5-10 minutes    Patient Stated Goals Open/close hand; Get more R LE motion;    Currently in Pain? Yes    Pain Score 2     Pain Location Shoulder    Pain Orientation Right    Pain Onset More than a month ago                 Gait training: with L anterior blue rocker and boot sliders on, 35', 30', 30', max A to advance R LE forward and to maintain R UE on walker, mod A during walking to maintain balance and to staibilize walker and to guide walker, cues to maintain upright posture Sit to stand and stand to sit: mox A for R UE placement on walker and to hold the walker to prevent tipping during transfer, mod A with sit to stand afterwards. Cues to reach for chair when sitting down: 4x Gait train                         PT Short Term Goals - 06/07/21 1618       PT SHORT TERM GOAL #1   Title Pt and caregiver will report standing at least 1x a day at the sink for improved standing tolerance. ALL STGS DUE 05/31/21    Baseline 05/26/21: </= 3 days a week for ~10 minutes before needing to sit down to rest.    Status Partially Met      PT SHORT  TERM GOAL #2   Title Pt will perform sit to stand transfer with min A from mat table with RW.    Baseline 05/27/21: conmtinues to fluctuate between min/mod assist    Status Not Met      PT SHORT TERM GOAL #3   Title Pt will perform squat pivot transfer from w/c <> mat table with mod A in order to demo decr caregiver burden.    Baseline 05/27/21: min/mod assist for squat or stand pivot with RW    Status Achieved      PT SHORT TERM GOAL #4   Title Pt will ambulate at least 29' with RW with min A +2 in order to demo improved functional mobility.    Baseline 05/27/21: met in session    Status Achieved               PT Long Term Goals - 05/17/21 1212       PT LONG TERM GOAL #1   Title Pt will be able to perform progression of/finalized HEP and perform standing consistently at home with caregiver assist,  TARGET  06/28/21    Baseline pt's son reports working on exercises at home with caregiver - pt will benefit from ongoing revisions/additions, pt not performing standing at home with caregivers.    Time 8    Period Weeks    Status On-going    Target Date 06/28/21      PT LONG TERM GOAL #2   Title Pt will be able to perform all edge of bed scooting with min A for improved ease of transfers, decreased caregiver burden    Baseline pt continues to require cues and min assist to laterally scoot to L, and mod assist and cues to laterally scoot to R    Time 8    Period Weeks    Status On-going      PT LONG TERM GOAL #3   Title Caregiver will report standing at sink or RW at home, for at least 5 minutes, at least 5 days per week, with min guard>supervision, for improved standing tolerance for ADL participation.    Baseline inconsistent standing at home    Time 8    Period Weeks    Status On-going      PT LONG TERM GOAL #4   Title Pt will be able to perform wheelchair transfer squat pivot with min A for improved independence vs. sliding board transfer with min guard, in order to decrease caregiver burden    Baseline pt needing mod A to min A with stand pivot transfer with RW, depends on the day.    Time 8    Period Weeks    Status Revised      PT LONG TERM GOAL #5   Title Pt will ambulate at least 4' with RW with mod A x1 in order to demo improved functional mobility.    Baseline last assessed on 04/21/21 with pt able to ambulate 35-40 x1' with min/mod A x2 therapists with w/c follow    Time 8    Period Weeks    Status Revised    Target Date 06/28/21      PT LONG TERM GOAL #6   Title Pt will perform sit <> stands with RW consistently with min A from w/c and mat table in order to demo improved functional transfers to decr caregiver burden.    Baseline needed mod A on 05/03/21    Time 8    Period  Weeks    Status Revised                   Plan - 06/07/21 1654     Clinical Impression  Statement Today's session focused on continued gait training to improve WB endurance and to work on advancing R LE I. Patient needs max A to adavance R LE, R UE placement on walker forward during swing phase (with L anterior rocker and shoe sliders). Pt required mod to max A for overall walking with 2nd person for wheelchair follow. Pt is able to ambulate ~30 feet before needing rest due to fatigue and requires increased motivation to progress walking.    Personal Factors and Comorbidities Age;Time since onset of injury/illness/exacerbation    Examination-Activity Limitations Locomotion Level;Transfers;Sit;Stand;Toileting;Dressing;Hygiene/Grooming;Self Feeding;Bed Mobility;Bend;Bathing    Examination-Participation Restrictions Community Activity;Shop;Meal Prep;Yard Work    [employer] Evolving/Moderate complexity    Rehab Potential Fair    PT Frequency 2x / week    PT Duration 12 weeks    PT Treatment/Interventions ADLs/Self Care Home Management;Electrical Stimulation;DME Instruction;Gait training;Functional mobility training;Therapeutic activities;Therapeutic exercise;Balance training;Neuromuscular re-education;Manual techniques;Patient/family education;Orthotic Fit/Training;Wheelchair mobility training;Passive range of motion;Dry needling;Taping    PT Next Visit Plan ? NMES to quads concurrent with ex's (need to confirm no contraindications, it is in the POC) continued to work on right LE stretching and strengthening; standing balance/tolerance, gait with RW with 1 person assist/tech for standby and wheelchair follow/ 2 person assist with tone is strong    PT Home Exercise Plan Forward lean x20 with caregiver; standing 3x daily for at least 30 sec with caregiver; [place] 12/29/20    Consulted and Agree with Plan of Care Patient    Family Member Consulted Cargiver present during part of session.             Patient will benefit from skilled therapeutic intervention in  order to improve the following deficits and impairments:  Abnormal gait, Difficulty walking, Impaired tone, Decreased range of motion, Decreased coordination, Decreased endurance, Impaired UE functional use, Decreased activity tolerance, Pain, Decreased balance, Decreased mobility, Decreased strength, Impaired sensation, Postural dysfunction, Improper body mechanics  Visit Diagnosis: Hemiplegia and hemiparesis following cerebral infarction affecting right dominant side (HCC)  Muscle weakness (generalized)  Unsteadiness on feet     Problem List Patient Active Problem List   Diagnosis Date Noted   Depressive disorder 04/07/2021   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   Impacted cerumen, bilateral 04/07/2021   Unspecified hearing loss, bilateral 04/07/2021   Hemiparesis as late effect of cerebrovascular accident (CVA) ([place]) 04/02/2020   Cerebrovascular accident (CVA) ([place])    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   Hypertensive urgency 03/12/2020   Acute on chronic systolic CHF (congestive heart failure) ([place]) 03/12/2020   AKI (acute kidney injury) ([place]) 03/12/2020   Fall at home, initial encounter 03/12/2020   Right hip pain 03/12/2020   Hypokalemia 03/12/2020   Coronary artery disease involving native coronary artery of native heart without angina pectoris 09/07/2014   Renal insufficiency 12/02/2012   Preventative health care 11/13/2010   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 [place], [place], [address] Phone: [phone]   Fax:  [phone]  Name:  RYDGE TEXIDOR MRN: [id number] Date of  Birth: 19-Dec-1955

## 2021-06-13 ENCOUNTER — Ambulatory Visit (INDEPENDENT_AMBULATORY_CARE_PROVIDER_SITE_OTHER): Payer: No Typology Code available for payment source

## 2021-06-13 DIAGNOSIS — I639 Cerebral infarction, unspecified: Secondary | ICD-10-CM

## 2021-06-13 LAB — CUP PACEART REMOTE DEVICE CHECK
Date Time Interrogation Session: 20230217230510
Implantable Pulse Generator Implant Date: 20211130

## 2021-06-14 ENCOUNTER — Encounter: Payer: Self-pay | Admitting: Physical Therapy

## 2021-06-14 ENCOUNTER — Encounter: Payer: Self-pay | Admitting: Occupational Therapy

## 2021-06-14 ENCOUNTER — Ambulatory Visit: Payer: No Typology Code available for payment source | Admitting: Physical Therapy

## 2021-06-14 ENCOUNTER — Other Ambulatory Visit: Payer: Self-pay

## 2021-06-14 ENCOUNTER — Ambulatory Visit: Payer: No Typology Code available for payment source | Admitting: Occupational Therapy

## 2021-06-14 DIAGNOSIS — M6281 Muscle weakness (generalized): Secondary | ICD-10-CM

## 2021-06-14 DIAGNOSIS — R41840 Attention and concentration deficit: Secondary | ICD-10-CM

## 2021-06-14 DIAGNOSIS — R278 Other lack of coordination: Secondary | ICD-10-CM

## 2021-06-14 DIAGNOSIS — I69351 Hemiplegia and hemiparesis following cerebral infarction affecting right dominant side: Secondary | ICD-10-CM

## 2021-06-14 DIAGNOSIS — R2681 Unsteadiness on feet: Secondary | ICD-10-CM

## 2021-06-14 DIAGNOSIS — M25611 Stiffness of right shoulder, not elsewhere classified: Secondary | ICD-10-CM

## 2021-06-14 DIAGNOSIS — M25621 Stiffness of right elbow, not elsewhere classified: Secondary | ICD-10-CM

## 2021-06-14 NOTE — Therapy (Signed)
[facility] [address] [place], [place], [address] Phone: [phone]   Fax:  [phone]  Occupational Therapy Treatment  Patient Details  Name: Trevor Anderson MRN: [id number] Date of Birth: Aug 06, 1955 Referring Provider (OT): Deland Pretty, NP   Encounter Date: 06/14/2021   OT End of Session - 06/14/21 1537     Visit Number 32    Number of Visits 34    Date for OT Re-Evaluation 72/25/75   per certification   Authorization Type VA    Authorization Time Period 45 visits   Expires 07/28/21    Authorization - Visit Number 31    Authorization - Number of Visits 45    Progress Note Due on Visit 27    OT Start Time 1534    OT Stop Time 1615    OT Time Calculation (min) 41 min    Activity Tolerance Patient tolerated treatment well    Behavior During Therapy [facility] for tasks assessed/performed             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   CONGESTIVE HEART FAILURE 11/12/2007   DIABETES MELLITUS, TYPE II 11/20/2008   HYPERLIPIDEMIA 11/12/2007   HYPERTENSION 11/12/2007   TACHYCARDIA 01/24/2010    Past Surgical History:  Procedure Laterality Date   BUBBLE STUDY  03/16/2020   Procedure: BUBBLE STUDY;  Surgeon: Buford Dresser, MD;  Location: [facility];  Service: Cardiovascular;;   LOOP RECORDER INSERTION N/A 03/23/2020   Procedure: LOOP RECORDER INSERTION;  Surgeon: Thompson Grayer, MD;  Location: [facility] CV LAB;  Service: Cardiovascular;  Laterality: N/A;   TEE WITHOUT CARDIOVERSION N/A 03/16/2020   Procedure: TRANSESOPHAGEAL ECHOCARDIOGRAM (TEE);  Surgeon: Buford Dresser, MD;  Location: [facility] ENDOSCOPY;  Service: Cardiovascular;  Laterality: N/A;   TONSILLECTOMY  1970    There were no vitals filed for this visit.   Subjective Assessment - 06/14/21 1537     Subjective  "my aid couldn't get here til 4"    Patient is accompanied by: --   caregiver    Pertinent History HLD, HTN, DM, CHF    Limitations Fall Risk. Max/Total stand pivot transfer w RW. Loop Recorder (no ESTIM)    Patient Stated Goals to move it (RUE)    Currently in Pain? Yes    Pain Score 1     Pain Location Back    Pain Orientation Lower    Pain Descriptors / Indicators Aching;Sore    Pain Type Chronic pain    Pain Onset More than a month ago    Pain Frequency Constant                          OT Treatments/Exercises (OP) - 06/14/21 1613       Bed Mobility   Sit to Sidelying Right Maximal Assistance - Patient 25-49%      Transfers   Comments stand pivot to L side from w/c to mat with mod A      Neurological Re-education Exercises   Other Exercises 1 sidelying with weight into RUE with working on AAROM for elbow flexion/extension with absent to trace activation present. Pt with trace activation in RUE finger extension noted today.      Manual Therapy   Manual Therapy Passive ROM    Passive ROM - severe spasticity in RUE  OT Short Term Goals - 05/26/21 1645       OT SHORT TERM GOAL #1   Title Pt and caregivers will be independent with HEP    Time 4    Period Weeks    Status Achieved    Target Date 02/09/21      OT SHORT TERM GOAL #2   Title Pt will complete UB dressing with min A for pull over short sleeve shirt.    Baseline mod A    Time 4    Period Weeks    Status Achieved   per pt report - completing with min A for hemi arm.     OT SHORT TERM GOAL #3   Title Pt and caregiver will verbalize understanding of adapted strategies and/or equipment for completing toilet and tub transfers with mod A consistently.    Baseline currently mod/max and not getting to shower.    Time 4    Period Weeks    Status Not Met   max A for transfers and unable to fully get to tub bench and shower safely at this time and not getting on [facility] consistently at home. still using bed pan. 03/08/21     OT SHORT TERM GOAL #4    Title Pt will achieve 90* of PROM for RUE shoulder flexion to reduce risk of malpositioning and increased pain.    Time 4    Period Weeks    Status Achieved   90* with RUE shoulder flexion 03/08/21     OT SHORT TERM GOAL #5   Title Pt and caregivers will verbalize and demonstrate understanding of wear and care of any splints and/or orthoses PRN.    Time 4    Period Weeks    Status Achieved               OT Long Term Goals - 05/26/21 1645       OT LONG TERM GOAL #1   Title Pt and caregivers will be independent with and updated HEPs    Time 10    Period Weeks    Status On-going      OT LONG TERM GOAL #2   Title Pt will complete toilet and tub transfers consistently with min assistance with adapted strategies and equipment PRN    Baseline mod A for stand pivot    Time 10    Period Weeks    Status Deferred   Pt consistently mod - max transfer. 03/08/21     OT LONG TERM GOAL #3   Title Pt will complete bathing with min A    Baseline mod-max    Time 10    Period Weeks    Status On-going   simulated min A in the clinic 03/08/21 - pt able to reach 75% with washcloth while seated in tilt in space chair.     OT LONG TERM GOAL #4   Title Pt will demonstrate composite flexion/extension of 15% or greater in RUE for preparing for active grasp/release and increasing functional use.    Baseline trace extension    Time 10    Period Weeks    Status On-going   trace extension/approx 10% flexion 03/08/21     OT LONG TERM GOAL #5   Title Pt will achieve -20 degrees of elbow extension in RUE of PROM or decreasing risk of skin breakdown    Baseline -40*    Time 10    Period Weeks    Status On-going   -  25* 03/08/21     OT LONG TERM GOAL #6   Title Pt and son will verbalize understanding of home modifications/equipment and/or adapted strategies for increasing independence with transfers for bathing and toileting and increasing safety and independence with ADLs/IADLs. (BSC, sponge baths  vs tub bench, long handled sponge, cutting food, etc)    Time 12    Period Weeks    Status On-going                   Plan - 06/14/21 1615     Clinical Impression Statement Pt continues to present with severe spasticity in RUE but responded well to new positions and self PROM today.    OT Occupational Profile and History Problem Focused Assessment - Including review of records relating to presenting problem    Occupational performance deficits (Please refer to evaluation for details): IADL's;ADL's;Leisure    Body Structure / Function / Physical Skills Tone;Strength;ADL;FMC;Mobility;GMC;ROM;IADL;UE functional use;Decreased knowledge of use of DME    Cognitive Skills Problem Solve    Rehab Potential Good    Clinical Decision Making Limited treatment options, no task modification necessary    Comorbidities Affecting Occupational Performance: None    Modification or Assistance to Complete Evaluation  No modification of tasks or assist necessary to complete eval    OT Frequency 1x / week    OT Duration 6 weeks   1x/week for 6 weeks at recertification last visit (05/26/21)   OT Treatment/Interventions Self-care/ADL training;Manual Therapy;Patient/family education;Electrical Stimulation;Neuromuscular education;Functional Mobility Training;Passive range of motion;Cognitive remediation/compensation;Therapeutic exercise;Moist Heat;DME and/or AE instruction;Therapeutic activities;Aquatic Therapy;Splinting    Plan trunk and hip mobility for increasing independence with ADLs, RUE spasticity management    OT Home Exercise Plan self PROM    Consulted and Agree with Plan of Care Patient;Family member/caregiver             Patient will benefit from skilled therapeutic intervention in order to improve the following deficits and impairments:   Body Structure / Function / Physical Skills: Tone, Strength, ADL, FMC, Mobility, GMC, ROM, IADL, UE functional use, Decreased knowledge of use of  DME Cognitive Skills: Problem Solve     Visit Diagnosis: Hemiplegia and hemiparesis following cerebral infarction affecting right dominant side (HCC)  Unsteadiness on feet  Other lack of coordination  Muscle weakness (generalized)  Stiffness of right elbow, not elsewhere classified  Stiffness of right shoulder, not elsewhere classified  Attention and concentration deficit    Problem List Patient Active Problem List   Diagnosis Date Noted   Depressive disorder 04/07/2021   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   Impacted cerumen, bilateral 04/07/2021   Unspecified hearing loss, bilateral 04/07/2021   Hemiparesis as late effect of cerebrovascular accident (CVA) ([place]) 04/02/2020   Cerebrovascular accident (CVA) ([place])    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   Hypertensive urgency 03/12/2020   Acute on chronic systolic CHF (congestive heart failure) ([place]) 03/12/2020   AKI (acute kidney injury) ([place]) 03/12/2020   Fall at home, initial encounter 03/12/2020   Right hip pain 03/12/2020   Hypokalemia 03/12/2020   Coronary artery disease involving native coronary artery of native heart without angina pectoris 09/07/2014   Renal insufficiency 12/02/2012   Preventative health care 11/13/2010   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 [place], [place], [address] Phone: [phone]   Fax:  [phone]  Name: Trevor Anderson MRN: [id number] Date of Birth: 1955-11-03

## 2021-06-14 NOTE — Therapy (Signed)
[facility] [address] [place], [place], [address] Phone: [phone]   Fax:  [phone]  Physical Therapy Treatment  Patient Details  Name: Trevor Anderson MRN: [id number] Date of Birth: 1955/11/07 Referring Provider (PT): Deland Pretty   Encounter Date: 06/14/2021   PT End of Session - 06/14/21 1620     Visit Number 38    Number of Visits 62    Date for PT Re-Evaluation 08/03/21    Authorization Type VA auth# [id number] (15 visits); VA has approved additional 15 PT visits to 05/26/2021; 15 visits through 08/03/21    Authorization - Visit Number 9    Authorization - Number of Visits 15    PT Start Time 3094    PT Stop Time 0768    PT Time Calculation (min) 41 min    Equipment Utilized During Treatment Gait belt;Other (comment)   AFO   Activity Tolerance Patient tolerated treatment well    Behavior During Therapy [facility] for tasks assessed/performed             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   CONGESTIVE HEART FAILURE 11/12/2007   DIABETES MELLITUS, TYPE II 11/20/2008   HYPERLIPIDEMIA 11/12/2007   HYPERTENSION 11/12/2007   TACHYCARDIA 01/24/2010    Past Surgical History:  Procedure Laterality Date   BUBBLE STUDY  03/16/2020   Procedure: BUBBLE STUDY;  Surgeon: Buford Dresser, MD;  Location: [facility];  Service: Cardiovascular;;   LOOP RECORDER INSERTION N/A 03/23/2020   Procedure: LOOP RECORDER INSERTION;  Surgeon: Thompson Grayer, MD;  Location: [facility];  Service: Cardiovascular;  Laterality: N/A;   TEE WITHOUT CARDIOVERSION N/A 03/16/2020   Procedure: TRANSESOPHAGEAL ECHOCARDIOGRAM (TEE);  Surgeon: Buford Dresser, MD;  Location: [facility] ENDOSCOPY;  Service: Cardiovascular;  Laterality: N/A;   TONSILLECTOMY  1970    There were no vitals filed for this visit.   Subjective Assessment - 06/14/21 1618     Subjective Saw [facility] last week  and will be getting his brace on March 17th.    Limitations Standing    How long can you stand comfortably? 5-10 minutes    Patient Stated Goals Open/close hand; Get more R LE motion;    Currently in Pain? Yes    Pain Score 1     Pain Location Back    Pain Orientation Lower    Pain Descriptors / Indicators Aching;Sore    Pain Type Chronic pain    Pain Onset More than a month ago    Aggravating Factors  Getting up.    Pain Relieving Factors Laying down.                               [place] Adult PT Treatment/Exercise - 06/14/21 1654       Bed Mobility   Supine to Sit Moderate Assistance - Patient 50-74%   assist at pt's trunk, pt able to bring BLE off mat. Cued to use LUE to help press from mat.     Transfers   Transfers Sit to Stand;Stand to Sit    Sit to Stand 3: Mod assist;With upper extremity assist;From chair/3-in-1   x2 therapists   Sit to Stand Details Verbal cues for sequencing;Verbal cues for technique;Verbal cues for precautions/safety;Verbal cues for safe use of DME/AE    Sit to Stand Details (indicate cue type and reason) Performed from lower mat table, performed x5 reps  total with pt needing mod A x2 therapists initially and then min A during last rep. Cues to scoot out towards edge (needing assist to get R side scooted further out). Cued for incr forward lean to stand and to stand on the count of 3.    Stand to Sit 3: Mod assist;With upper extremity assist;To elevated surface;To bed;To chair/3-in-1;Uncontrolled descent    Stand to Sit Details (indicate cue type and reason) Verbal cues for sequencing;Verbal cues for technique    Stand to Sit Details Cues to reach posteriorly with LUE and to bend from hips.    Comments Put pt's R hand in orthosis before standing due to pt having incr tone in standing and therapist unable to initially put it in hand orthosis      Ambulation/Gait   Ambulation/Gait Yes    Ambulation/Gait Assistance 3: Mod assist   of 2  persons plus w/c follow   Ambulation/Gait Assistance Details Use of R blue rocker AFO and blue shoe cover to assist with foot clearance. One therapist assisting with bringing RLE through during swing phase and balance, 2nd therapist helping to steer RW and help at trunk for posture. Cues throughout for incr stance time on RLE and tall posture.    Ambulation Distance (Feet) 20 Feet   x1   Assistive device Rolling walker;Other (Comment)    Gait Pattern Step-to pattern;Decreased stride length;Decreased step length - right;Decreased step length - left;Decreased stance time - right;Decreased hip/knee flexion - right;Decreased dorsiflexion - right;Decreased weight shift to right;Right flexed knee in stance;Decreased trunk rotation;Narrow base of support;Poor foot clearance - right    Ambulation Surface Level;Indoor    Pre-Gait Activities Standing at RW: x5 reps stepping forward with LLE and then back to midline. Cues for posture and RLE weight shift.      Knee/Hip Exercises: Supine   Bridges AROM;Both;1 set;10 reps    Bridges Limitations x10 reps with tactile cues from therapist at pt's R ASIS for incr RLE activation. An additional x5 reps with pt squeezing pillow for hip ADD    Other Supine Knee/Hip Exercises x5 reps lower trunk rotations while squeezing pillow for hip ADD, pt needing assist with trunk rotations to L, held for approx. 10-15 seconds each side for a stretch.                       PT Short Term Goals - 06/07/21 1618       PT SHORT TERM GOAL #1   Title Pt and caregiver will report standing at least 1x a day at the sink for improved standing tolerance. ALL STGS DUE 05/31/21    Baseline 05/26/21: </= 3 days a week for ~10 minutes before needing to sit down to rest.    Status Partially Met      PT SHORT TERM GOAL #2   Title Pt will perform sit to stand transfer with min A from mat table with RW.    Baseline 05/27/21: conmtinues to fluctuate between min/mod assist    Status Not  Met      PT SHORT TERM GOAL #3   Title Pt will perform squat pivot transfer from w/c <> mat table with mod A in order to demo decr caregiver burden.    Baseline 05/27/21: min/mod assist for squat or stand pivot with RW    Status Achieved      PT SHORT TERM GOAL #4   Title Pt will ambulate at least 51' with RW with  min A +2 in order to demo improved functional mobility.    Baseline 05/27/21: met in session    Status Achieved               PT Long Term Goals - 05/17/21 1212       PT LONG TERM GOAL #1   Title Pt will be able to perform progression of/finalized HEP and perform standing consistently at home with caregiver assist,  TARGET 06/28/21    Baseline pt's son reports working on exercises at home with caregiver - pt will benefit from ongoing revisions/additions, pt not performing standing at home with caregivers.    Time 8    Period Weeks    Status On-going    Target Date 06/28/21      PT LONG TERM GOAL #2   Title Pt will be able to perform all edge of bed scooting with min A for improved ease of transfers, decreased caregiver burden    Baseline pt continues to require cues and min assist to laterally scoot to L, and mod assist and cues to laterally scoot to R    Time 8    Period Weeks    Status On-going      PT LONG TERM GOAL #3   Title Caregiver will report standing at sink or RW at home, for at least 5 minutes, at least 5 days per week, with min guard>supervision, for improved standing tolerance for ADL participation.    Baseline inconsistent standing at home    Time 8    Period Weeks    Status On-going      PT LONG TERM GOAL #4   Title Pt will be able to perform wheelchair transfer squat pivot with min A for improved independence vs. sliding board transfer with min guard, in order to decrease caregiver burden    Baseline pt needing mod A to min A with stand pivot transfer with RW, depends on the day.    Time 8    Period Weeks    Status Revised      PT LONG TERM GOAL  #5   Title Pt will ambulate at least 40' with RW with mod A x1 in order to demo improved functional mobility.    Baseline last assessed on 04/21/21 with pt able to ambulate 35-40 x1' with min/mod A x2 therapists with w/c follow    Time 8    Period Weeks    Status Revised    Target Date 06/28/21      PT LONG TERM GOAL #6   Title Pt will perform sit <> stands with RW consistently with min A from w/c and mat table in order to demo improved functional transfers to decr caregiver burden.    Baseline needed mod A on 05/03/21    Time 8    Period Weeks    Status Revised                   Plan - 06/14/21 2049     Clinical Impression Statement Continued to work on BLE strengthening, sit <> stands, and gait training with RW. Pt needing more assist today of mod A x2 to initially performed sit <> stands from a lower mat table, did progress to min A x2 with incr reps. Took incr time to place R hand in orthosis due to incr tone. Pt needing mod A x2 therapists today for gait and a w/c follow. Pt only able to ambulate 20' today before needing  to rest due to fatigue. Will continue to progress towards LTs.    Personal Factors and Comorbidities Age;Time since onset of injury/illness/exacerbation    Examination-Activity Limitations Locomotion Level;Transfers;Sit;Stand;Toileting;Dressing;Hygiene/Grooming;Self Feeding;Bed Mobility;Bend;Bathing    Examination-Participation Restrictions Community Activity;Shop;Meal Prep;Yard Work    [employer] Evolving/Moderate complexity    Rehab Potential Fair    PT Frequency 2x / week    PT Duration 12 weeks    PT Treatment/Interventions ADLs/Self Care Home Management;Electrical Stimulation;DME Instruction;Gait training;Functional mobility training;Therapeutic activities;Therapeutic exercise;Balance training;Neuromuscular re-education;Manual techniques;Patient/family education;Orthotic Fit/Training;Wheelchair mobility training;Passive range of  motion;Dry needling;Taping    PT Next Visit Plan ? NMES to quads concurrent with ex's (need to confirm no contraindications, it is in the POC) continued to work on right LE stretching and strengthening; standing balance/tolerance, gait with RW with 1 person assist/tech for standby and wheelchair follow/ 2 person assist with tone is strong    PT Home Exercise Plan Forward lean x20 with caregiver; standing 3x daily for at least 30 sec with caregiver; [place] 12/29/20    Consulted and Agree with Plan of Care Patient    Family Member Consulted [place] present during part of session.             Patient will benefit from skilled therapeutic intervention in order to improve the following deficits and impairments:  Abnormal gait, Difficulty walking, Impaired tone, Decreased range of motion, Decreased coordination, Decreased endurance, Impaired UE functional use, Decreased activity tolerance, Pain, Decreased balance, Decreased mobility, Decreased strength, Impaired sensation, Postural dysfunction, Improper body mechanics  Visit Diagnosis: Hemiplegia and hemiparesis following cerebral infarction affecting right dominant side (HCC)  Unsteadiness on feet  Muscle weakness (generalized)     Problem List Patient Active Problem List   Diagnosis Date Noted   Depressive disorder 04/07/2021   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   Impacted cerumen, bilateral 04/07/2021   Unspecified hearing loss, bilateral 04/07/2021   Hemiparesis as late effect of cerebrovascular accident (CVA) ([place]) 04/02/2020   Cerebrovascular accident (CVA) ([place])    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   Hypertensive urgency 03/12/2020   Acute on chronic systolic CHF (congestive heart failure) ([place]) 03/12/2020   AKI (acute kidney injury) ([place]) 03/12/2020   Fall at home, initial encounter 03/12/2020   Right hip pain 03/12/2020   Hypokalemia 03/12/2020   Coronary artery disease involving native coronary  artery of native heart without angina pectoris 09/07/2014   Renal insufficiency 12/02/2012   Preventative health care 11/13/2010   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

## 2021-06-16 ENCOUNTER — Encounter: Payer: No Typology Code available for payment source | Admitting: Occupational Therapy

## 2021-06-16 ENCOUNTER — Ambulatory Visit: Payer: No Typology Code available for payment source

## 2021-06-17 NOTE — Progress Notes (Signed)
Carelink Summary Report / Loop Recorder 

## 2021-06-21 ENCOUNTER — Ambulatory Visit: Payer: No Typology Code available for payment source | Admitting: Occupational Therapy

## 2021-06-21 ENCOUNTER — Other Ambulatory Visit: Payer: Self-pay

## 2021-06-21 ENCOUNTER — Ambulatory Visit: Payer: No Typology Code available for payment source | Admitting: Physical Therapy

## 2021-06-21 ENCOUNTER — Encounter: Payer: Self-pay | Admitting: Physical Therapy

## 2021-06-21 ENCOUNTER — Encounter: Payer: Self-pay | Admitting: Occupational Therapy

## 2021-06-21 VITALS — BP 112/84 | HR 109

## 2021-06-21 DIAGNOSIS — R41840 Attention and concentration deficit: Secondary | ICD-10-CM

## 2021-06-21 DIAGNOSIS — R2681 Unsteadiness on feet: Secondary | ICD-10-CM

## 2021-06-21 DIAGNOSIS — M6281 Muscle weakness (generalized): Secondary | ICD-10-CM

## 2021-06-21 DIAGNOSIS — I69351 Hemiplegia and hemiparesis following cerebral infarction affecting right dominant side: Secondary | ICD-10-CM

## 2021-06-21 DIAGNOSIS — R278 Other lack of coordination: Secondary | ICD-10-CM

## 2021-06-21 DIAGNOSIS — M25611 Stiffness of right shoulder, not elsewhere classified: Secondary | ICD-10-CM

## 2021-06-21 DIAGNOSIS — M25621 Stiffness of right elbow, not elsewhere classified: Secondary | ICD-10-CM

## 2021-06-21 DIAGNOSIS — R293 Abnormal posture: Secondary | ICD-10-CM

## 2021-06-21 NOTE — Therapy (Signed)
[facility] [address] [place], [place], [address] Phone: [phone]   Fax:  [phone]  Occupational Therapy Treatment  Patient Details  Name: Trevor Anderson MRN: [id number] Date of Birth: 18-Feb-1956 Referring Provider (OT): Deland Pretty, NP   Encounter Date: 06/21/2021   OT End of Session - 06/21/21 1636     Visit Number 33    Number of Visits 34    Date for OT Re-Evaluation 07/28/21    Authorization Type VA    Authorization Time Period 45 visits   Expires 07/28/21    Authorization - Visit Number 29    Authorization - Number of Visits 45    Progress Note Due on Visit 18    OT Start Time 1530    OT Stop Time 1615    OT Time Calculation (min) 45 min    Activity Tolerance Patient tolerated treatment well    Behavior During Therapy [facility] for tasks assessed/performed             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   CONGESTIVE HEART FAILURE 11/12/2007   DIABETES MELLITUS, TYPE II 11/20/2008   HYPERLIPIDEMIA 11/12/2007   HYPERTENSION 11/12/2007   TACHYCARDIA 01/24/2010    Past Surgical History:  Procedure Laterality Date   BUBBLE STUDY  03/16/2020   Procedure: BUBBLE STUDY;  Surgeon: Buford Dresser, MD;  Location: [facility];  Service: Cardiovascular;;   LOOP RECORDER INSERTION N/A 03/23/2020   Procedure: LOOP RECORDER INSERTION;  Surgeon: Thompson Grayer, MD;  Location: [facility];  Service: Cardiovascular;  Laterality: N/A;   TEE WITHOUT CARDIOVERSION N/A 03/16/2020   Procedure: TRANSESOPHAGEAL ECHOCARDIOGRAM (TEE);  Surgeon: Buford Dresser, MD;  Location: [facility];  Service: Cardiovascular;  Laterality: N/A;   TONSILLECTOMY  1970    There were no vitals filed for this visit.   Subjective Assessment - 06/21/21 1535     Subjective  My hip is hurting - off an on    Pertinent History HLD, HTN, DM, CHF    Limitations Fall Risk.  Max/Total stand pivot transfer w RW. Loop Recorder (no ESTIM)    Currently in Pain? Yes    Pain Score 1     Pain Location Hip    Pain Orientation Right    Pain Descriptors / Indicators Aching    Pain Type Chronic pain    Pain Onset More than a month ago    Pain Frequency Constant                          OT Treatments/Exercises (OP) - 06/21/21 0001       Neurological Re-education Exercises   Other Exercises 1 Neuromuscular reeducation to address postural control and alignment of base of support.  Patient with increased clonus in right leg this session.  Worked to increase weight shifting to right hip.  Patient with tendency to pull self to left - uses head, upper trunk and left arm to maintain weight biased toward left.  Patient with perceptual deficit - so noted some active resistance to weight shift to right.  Worked on sit to partial stand with weight on BLE- initially patient only accepted weight on LLE.    Other Exercises 2 Worked on aligning right shoulder and then attempting gentle weight bearing on closed fist.      Manual Therapy   Manual Therapy Joint mobilization    Joint Mobilization carpal mobilization to  align wrist before weight bearing / closed chain                      OT Short Term Goals - 06/21/21 1638       OT SHORT TERM GOAL #1   Title Pt and caregivers will be independent with HEP    Time 4    Period Weeks    Status Achieved    Target Date 02/09/21      OT SHORT TERM GOAL #2   Title Pt will complete UB dressing with min A for pull over short sleeve shirt.    Baseline mod A    Time 4    Period Weeks    Status Achieved   per pt report - completing with min A for hemi arm.     OT SHORT TERM GOAL #3   Title Pt and caregiver will verbalize understanding of adapted strategies and/or equipment for completing toilet and tub transfers with mod A consistently.    Baseline currently mod/max and not getting to shower.    Time 4     Period Weeks    Status Not Met   max A for transfers and unable to fully get to tub bench and shower safely at this time and not getting on [facility] consistently at home. still using bed pan. 03/08/21     OT SHORT TERM GOAL #4   Title Pt will achieve 90* of PROM for RUE shoulder flexion to reduce risk of malpositioning and increased pain.    Time 4    Period Weeks    Status Achieved   90* with RUE shoulder flexion 03/08/21     OT SHORT TERM GOAL #5   Title Pt and caregivers will verbalize and demonstrate understanding of wear and care of any splints and/or orthoses PRN.    Time 4    Period Weeks    Status Achieved               OT Long Term Goals - 06/21/21 1639       OT LONG TERM GOAL #1   Title Pt and caregivers will be independent with and updated HEPs    Time 10    Period Weeks    Status On-going      OT LONG TERM GOAL #2   Title Pt will complete toilet and tub transfers consistently with min assistance with adapted strategies and equipment PRN    Baseline mod A for stand pivot    Time 10    Period Weeks    Status Deferred   Pt consistently mod - max transfer. 03/08/21     OT LONG TERM GOAL #3   Title Pt will complete bathing with min A    Baseline mod-max    Time 10    Period Weeks    Status On-going   simulated min A in the clinic 03/08/21 - pt able to reach 75% with washcloth while seated in tilt in space chair.     OT LONG TERM GOAL #4   Title Pt will demonstrate composite flexion/extension of 15% or greater in RUE for preparing for active grasp/release and increasing functional use.    Baseline trace extension    Time 10    Period Weeks    Status On-going   trace extension/approx 10% flexion 03/08/21     OT LONG TERM GOAL #5   Title Pt will achieve -20 degrees of elbow extension in RUE  of PROM or decreasing risk of skin breakdown    Baseline -40*    Time 10    Period Weeks    Status On-going   -25* 03/08/21     OT LONG TERM GOAL #6   Title Pt and son  will verbalize understanding of home modifications/equipment and/or adapted strategies for increasing independence with transfers for bathing and toileting and increasing safety and independence with ADLs/IADLs. (BSC, sponge baths vs tub bench, long handled sponge, cutting food, etc)    Time 12    Period Weeks    Status On-going                   Plan - 06/21/21 1637     Clinical Impression Statement Pt still awaits Botox injection to address significant muscle tension.  Plan is to discharge as planned - and may consider return to OT if UE injections occur    OT Occupational Profile and History Problem Focused Assessment - Including review of records relating to presenting problem    Occupational performance deficits (Please refer to evaluation for details): IADL's;ADL's;Leisure    Body Structure / Function / Physical Skills Tone;Strength;ADL;FMC;Mobility;GMC;ROM;IADL;UE functional use;Decreased knowledge of use of DME    Cognitive Skills Problem Solve    Rehab Potential Good    Clinical Decision Making Limited treatment options, no task modification necessary    Comorbidities Affecting Occupational Performance: None    Modification or Assistance to Complete Evaluation  No modification of tasks or assist necessary to complete eval    OT Frequency 1x / week    OT Duration 6 weeks   1x/week for 6 weeks at recertification last visit (05/26/21)   OT Treatment/Interventions Self-care/ADL training;Manual Therapy;Patient/family education;Electrical Stimulation;Neuromuscular education;Functional Mobility Training;Passive range of motion;Cognitive remediation/compensation;Therapeutic exercise;Moist Heat;DME and/or AE instruction;Therapeutic activities;Aquatic Therapy;Splinting    Plan check goals, trunk and hip mobility for increasing independence with ADLs, RUE spasticity management    OT Home Exercise Plan self PROM    Consulted and Agree with Plan of Care Patient;Family member/caregiver              Patient will benefit from skilled therapeutic intervention in order to improve the following deficits and impairments:   Body Structure / Function / Physical Skills: Tone, Strength, ADL, FMC, Mobility, GMC, ROM, IADL, UE functional use, Decreased knowledge of use of DME Cognitive Skills: Problem Solve     Visit Diagnosis: Hemiplegia and hemiparesis following cerebral infarction affecting right dominant side (HCC)  Unsteadiness on feet  Muscle weakness (generalized)  Other lack of coordination  Stiffness of right elbow, not elsewhere classified  Stiffness of right shoulder, not elsewhere classified  Attention and concentration deficit  Abnormal posture    Problem List Patient Active Problem List   Diagnosis Date Noted   Depressive disorder 04/07/2021   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   Impacted cerumen, bilateral 04/07/2021   Unspecified hearing loss, bilateral 04/07/2021   Hemiparesis as late effect of cerebrovascular accident (CVA) ([place]) 04/02/2020   Cerebrovascular accident (CVA) ([place])    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   Hypertensive urgency 03/12/2020   Acute on chronic systolic CHF (congestive heart failure) ([place]) 03/12/2020   AKI (acute kidney injury) ([place]) 03/12/2020   Fall at home, initial encounter 03/12/2020   Right hip pain 03/12/2020   Hypokalemia 03/12/2020   Coronary artery disease involving native coronary artery of native heart without angina pectoris 09/07/2014   Renal insufficiency 12/02/2012   Preventative health care 11/13/2010  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) 11/20/2008   Mixed hyperlipidemia 11/12/2007   Essential hypertension 11/12/2007   CONGESTIVE HEART FAILURE 11/12/2007    Mariah Milling, OT 06/21/2021, 4:40 PM  [facility] [address] [place], [place], [address] Phone: [phone]   Fax:  [phone]  Name: Trevor Anderson MRN: [id number] Date of Birth: July 03, 1955

## 2021-06-22 NOTE — Therapy (Signed)
OUTPATIENT PHYSICAL THERAPY TREATMENT NOTE   Patient Name: Trevor Anderson MRN: [id number] DOB:10/19/1955, 66 y.o., male Today's Date: 06/22/2021  PCP: Biagio Borg, MD REFERRING PROVIDER: Biagio Borg, MD   PT End of Session - 06/21/21 1619     Visit Number 39    Number of Visits 58    Date for PT Re-Evaluation 08/03/21    Authorization Type [facility] auth# [id number] (15 visits); VA has approved additional 15 PT visits to 05/26/2021; 15 visits through 08/03/21    Authorization - Visit Number 10    Authorization - Number of Visits 15    PT Start Time 0881    PT Stop Time 1031    PT Time Calculation (min) 40 min    Equipment Utilized During Treatment Gait belt;Other (comment)   AFO   Activity Tolerance Patient tolerated treatment well;Patient limited by fatigue   limited by incr clonus.   Behavior During Therapy [facility] for tasks assessed/performed             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   CONGESTIVE HEART FAILURE 11/12/2007   DIABETES MELLITUS, TYPE II 11/20/2008   HYPERLIPIDEMIA 11/12/2007   HYPERTENSION 11/12/2007   TACHYCARDIA 01/24/2010   Past Surgical History:  Procedure Laterality Date   BUBBLE STUDY  03/16/2020   Procedure: BUBBLE STUDY;  Surgeon: Buford Dresser, MD;  Location: [place];  Service: Cardiovascular;;   LOOP RECORDER INSERTION N/A 03/23/2020   Procedure: LOOP RECORDER INSERTION;  Surgeon: Thompson Grayer, MD;  Location: [facility];  Service: Cardiovascular;  Laterality: N/A;   TEE WITHOUT CARDIOVERSION N/A 03/16/2020   Procedure: TRANSESOPHAGEAL ECHOCARDIOGRAM (TEE);  Surgeon: Buford Dresser, MD;  Location: [facility] ENDOSCOPY;  Service: Cardiovascular;  Laterality: N/A;   TONSILLECTOMY  1970   Patient Active Problem List   Diagnosis Date Noted   Depressive disorder 04/07/2021   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   Impacted cerumen, bilateral 04/07/2021   Unspecified hearing loss,  bilateral 04/07/2021   Hemiparesis as late effect of cerebrovascular accident (CVA) ([place]) 04/02/2020   Cerebrovascular accident (CVA) ([place])    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   Hypertensive urgency 03/12/2020   Acute on chronic systolic CHF (congestive heart failure) ([place]) 03/12/2020   AKI (acute kidney injury) ([place]) 03/12/2020   Fall at home, initial encounter 03/12/2020   Right hip pain 03/12/2020   Hypokalemia 03/12/2020   Coronary artery disease involving native coronary artery of native heart without angina pectoris 09/07/2014   Renal insufficiency 12/02/2012   Preventative health care 11/13/2010   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  PAIN:  Are you having pain? Yes NPRS scale: 1/10 Pain location: Neck Pain orientation: Posterior  PAIN TYPE: aching Pain description: constant  Aggravating factors: Moving it Relieving factors: Nothing     TODAY'S TREATMENT:    06/21/21 1641  Transfers  Transfers Sit to Stand;Stand to Sit;Squat Pivot Transfers  Sit to Stand 3: Mod assist;With  upper extremity assist;From chair/3-in-1  Sit to Stand Details Verbal cues for sequencing;Verbal cues for technique;Verbal cues for precautions/safety;Verbal cues for safe use of DME/AE  Sit to Stand Details (indicate cue type and reason) Performing with 2 therapists - one therapist in front of  pt placing foot in between pt's feet to prevent incr ADD of RLE when standing and to help with incr anterior weight shift with cues to lean towards therapist's shoulder. 2nd therapist on pt's R providing mod A to help with incr forward lean and to come to stand. Performed from elevated mat table, pt needing more assist today due to being more fatigued. Pt's RLE demonstrating clonus throughout seated today and needing manual assistance to help put weight through RLE to calm it. Verbal cues to pt to weight bear through RLE. Performed x10 reps throughout session, for 5 reps used blue wedge underneath pt to help with anterior pelvic tilt and forward lean to come to stand. Pt reporting an incr stretch in his hips with this and requested for it to be removed from under him after 5 reps.  Stand to Sit 3: Mod assist;4: Min assist  Stand to Sit Details (indicate cue type and reason) Verbal cues for sequencing;Verbal cues for technique  Stand to Sit Details Cues each time to reach back with LUE to mat table for slowed descent  Squat Pivot Transfers 3: Mod assist  Squat Pivot Transfer Details (indicate cue type and reason) From mat table > w/c at end of session, needing assist for incr forward weight shift and head/hips relationship. Performed in 3 scoots over to his w/c. Once pt in his w/c, pt able to scoot posteriorly into his chair for improved positioning.  Comments Donned R blue rocker anterior AFO prior to standing. During standing: pt cued with verbal/tactile cues to stand tall and activate glutes for hip extension. Able to stand for a max of 30 seconds today before incr fatigue and demonstrating incr forward flexed posture with hip and knee flexion. During 2 reps of standing performed lateral weight shifting 2 sets of 5 reps going from R/L with manual cues at pelvis for weight shift and additional cues for upright posture and extensor activation.  Exercises  Exercises Other Exercises  Other Exercises  Seated  at edge of mat table 4 x 30 seconds R hamstring stretch to pt's tolerance (place pt's leg on therapist's). Cued for tall posture for an additional stretch. Performed 4 x 30 seconds R ankle DF stretch prior to standing.     PATIENT EDUCATION: Education details: Scheduling additional appts (3 more available in pt's auth), will plan to schedule them after pt receives his AFO (pt reports he will receive his brace on March 17th) Person educated: Patient and Caregiver Education method: Explanation Education comprehension: verbalized understanding   HOME EXERCISE PROGRAM: Forward lean x20 with caregiver; standing 3x daily for at least 30 sec with caregiver; [facility] 12/29/20 [facility])   PT Short Term Goals - 06/07/21 1618       PT SHORT TERM GOAL #1   Title Pt and caregiver will report standing at least 1x a day at the sink for improved standing tolerance. ALL STGS DUE 05/31/21    Baseline 05/26/21: </= 3 days a week for ~10 minutes before needing to sit down to rest.    Status Partially Met      PT SHORT TERM GOAL #2   Title Pt will perform sit to stand transfer with min A from mat table with  RW.    Baseline 05/27/21: conmtinues to fluctuate between min/mod assist    Status Not Met      PT SHORT TERM GOAL #3   Title Pt will perform squat pivot transfer from w/c <> mat table with mod A in order to demo decr caregiver burden.    Baseline 05/27/21: min/mod assist for squat or stand pivot with RW    Status Achieved      PT SHORT TERM GOAL #4   Title Pt will ambulate at least 44' with RW with min A +2 in order to demo improved functional mobility.    Baseline 05/27/21: met in session    Status Achieved              PT Long Term Goals - 05/17/21 1212       PT LONG TERM GOAL #1   Title Pt will be able to perform progression of/finalized HEP and perform standing consistently at home with caregiver assist,  TARGET 06/28/21    Baseline pt's son reports working on exercises at home with caregiver  - pt will benefit from ongoing revisions/additions, pt not performing standing at home with caregivers.    Time 8    Period Weeks    Status On-going    Target Date 06/28/21      PT LONG TERM GOAL #2   Title Pt will be able to perform all edge of bed scooting with min A for improved ease of transfers, decreased caregiver burden    Baseline pt continues to require cues and min assist to laterally scoot to L, and mod assist and cues to laterally scoot to R    Time 8    Period Weeks    Status On-going      PT LONG TERM GOAL #3   Title Caregiver will report standing at sink or RW at home, for at least 5 minutes, at least 5 days per week, with min guard>supervision, for improved standing tolerance for ADL participation.    Baseline inconsistent standing at home    Time 8    Period Weeks    Status On-going      PT LONG TERM GOAL #4   Title Pt will be able to perform wheelchair transfer squat pivot with min A for improved independence vs. sliding board transfer with min guard, in order to decrease caregiver burden    Baseline pt needing mod A to min A with stand pivot transfer with RW, depends on the day.    Time 8    Period Weeks    Status Revised      PT LONG TERM GOAL #5   Title Pt will ambulate at least 25' with RW with mod A x1 in order to demo improved functional mobility.    Baseline last assessed on 04/21/21 with pt able to ambulate 35-40 x1' with min/mod A x2 therapists with w/c follow    Time 8    Period Weeks    Status Revised    Target Date 06/28/21      PT LONG TERM GOAL #6   Title Pt will perform sit <> stands with RW consistently with min A from w/c and mat table in order to demo improved functional transfers to decr caregiver burden.    Baseline needed mod A on 05/03/21    Time 8    Period Weeks    Status Revised              Plan -  06/22/21 0900     Clinical Impression Statement Pt more fatigued today after OT session and not able to tolerate much standing  activity and not able to progress gait today with RW and R AFO. Tried to work on block practice of sit <> stands from elevated mat table, but pt needing frequent rest breaks.  Pt with incr clonus throughout session and needing assist from therapist to help with weight bearing through RLE. Towards end of session, pt and pt's caregiver realized that they had not given the pt his spasticity medication today.    Personal Factors and Comorbidities Age;Time since onset of injury/illness/exacerbation    Examination-Activity Limitations Locomotion Level;Transfers;Sit;Stand;Toileting;Dressing;Hygiene/Grooming;Self Feeding;Bed Mobility;Bend;Bathing    Examination-Participation Restrictions Community Activity;Shop;Meal Prep;Yard Work    [employer] Evolving/Moderate complexity    Rehab Potential Fair    PT Frequency 2x / week    PT Duration 12 weeks    PT Treatment/Interventions ADLs/Self Care Home Management;Electrical Stimulation;DME Instruction;Gait training;Functional mobility training;Therapeutic activities;Therapeutic exercise;Balance training;Neuromuscular re-education;Manual techniques;Patient/family education;Orthotic Fit/Training;Wheelchair mobility training;Passive range of motion;Dry needling;Taping    PT Next Visit Plan LTGs due 05/30/81 - cert goes through 08/10/60, so can check and update goals for 12 weeks. ? NMES to quads concurrent with ex's (need to confirm no contraindications, it is in the POC) continued to work on right LE stretching and strengthening; standing balance/tolerance, gait with RW with 1 person assist/tech for standby and wheelchair follow/ 2 person assist with tone is strong    PT Home Exercise Plan Forward lean x20 with caregiver; standing 3x daily for at least 30 sec with caregiver; [place] 12/29/20    Consulted and Agree with Plan of Care Patient    Family Member Consulted [place] present during part of session.               Arliss Journey,  PT, DPT 06/22/2021, 9:05 AM

## 2021-06-23 ENCOUNTER — Encounter: Payer: No Typology Code available for payment source | Admitting: Occupational Therapy

## 2021-06-23 ENCOUNTER — Other Ambulatory Visit: Payer: Self-pay

## 2021-06-23 ENCOUNTER — Ambulatory Visit: Payer: No Typology Code available for payment source | Attending: Adult Health

## 2021-06-23 DIAGNOSIS — I69351 Hemiplegia and hemiparesis following cerebral infarction affecting right dominant side: Secondary | ICD-10-CM

## 2021-06-23 DIAGNOSIS — R41840 Attention and concentration deficit: Secondary | ICD-10-CM | POA: Insufficient documentation

## 2021-06-23 DIAGNOSIS — R2681 Unsteadiness on feet: Secondary | ICD-10-CM | POA: Diagnosis present

## 2021-06-23 DIAGNOSIS — M25621 Stiffness of right elbow, not elsewhere classified: Secondary | ICD-10-CM | POA: Diagnosis present

## 2021-06-23 DIAGNOSIS — M25611 Stiffness of right shoulder, not elsewhere classified: Secondary | ICD-10-CM | POA: Diagnosis present

## 2021-06-23 DIAGNOSIS — M6281 Muscle weakness (generalized): Secondary | ICD-10-CM

## 2021-06-23 DIAGNOSIS — R278 Other lack of coordination: Secondary | ICD-10-CM

## 2021-06-23 NOTE — Patient Instructions (Signed)
Access Code: [email or web address] ?URL: [email or web address] ?Date: 06/23/2021 ?Prepared by: Jethro Bastos ? ?Program Notes ?Also complete the following:  ?Forward lean x 20 reps with Caregiver ?Standing 3x daily for at least 30 sec with caregiver ? ? ?Exercises ?Seated Hip Adduction Isometrics with Ball - 1-2 x daily - 5 x weekly - 1-2 sets - 10 reps - 3 sec hold ? ?

## 2021-06-23 NOTE — Therapy (Addendum)
OUTPATIENT PHYSICAL THERAPY TREATMENT NOTE   Patient Name: Trevor Anderson MRN: [id number] DOB:[birth year], 66 y.o., male Today's Date: 06/23/2021  PCP: Biagio Borg, MD REFERRING PROVIDER: Biagio Borg, MD   PT End of Session - 06/23/21 1314     Visit Number 40    Number of Visits 35    Date for PT Re-Evaluation 08/03/21    Authorization Type VA auth# [id number] (15 visits); VA has approved additional 15 PT visits to 05/26/2021; 15 visits through 08/03/21    Authorization - Visit Number 11    Authorization - Number of Visits 15    PT Start Time 9381    PT Stop Time 1400    PT Time Calculation (min) 45 min    Equipment Utilized During Treatment Gait belt;Other (comment)   AFO   Activity Tolerance Patient tolerated treatment well;Patient limited by fatigue   limited by incr clonus.   Behavior During Therapy [facility] for tasks assessed/performed              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   CONGESTIVE HEART FAILURE 11/12/2007   DIABETES MELLITUS, TYPE II 11/20/2008   HYPERLIPIDEMIA 11/12/2007   HYPERTENSION 11/12/2007   TACHYCARDIA 01/24/2010   Past Surgical History:  Procedure Laterality Date   BUBBLE STUDY  03/16/2020   Procedure: BUBBLE STUDY;  Surgeon: Buford Dresser, MD;  Location: [place];  Service: Cardiovascular;;   LOOP RECORDER INSERTION N/A 03/23/2020   Procedure: LOOP RECORDER INSERTION;  Surgeon: Thompson Grayer, MD;  Location: [place] CV [facility];  Service: Cardiovascular;  Laterality: N/A;   TEE WITHOUT CARDIOVERSION N/A 03/16/2020   Procedure: TRANSESOPHAGEAL ECHOCARDIOGRAM (TEE);  Surgeon: Buford Dresser, MD;  Location: [facility] ENDOSCOPY;  Service: Cardiovascular;  Laterality: N/A;   TONSILLECTOMY  1970   Patient Active Problem List   Diagnosis Date Noted   Depressive disorder 04/07/2021   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   Impacted cerumen, bilateral 04/07/2021   Unspecified hearing loss,  bilateral 04/07/2021   Hemiparesis as late effect of cerebrovascular accident (CVA) ([place]) 04/02/2020   Cerebrovascular accident (CVA) ([place])    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   Hypertensive urgency 03/12/2020   Acute on chronic systolic CHF (congestive heart failure) ([place]) 03/12/2020   AKI (acute kidney injury) ([place]) 03/12/2020   Fall at home, initial encounter 03/12/2020   Right hip pain 03/12/2020   Hypokalemia 03/12/2020   Coronary artery disease involving native coronary artery of native heart without angina pectoris 09/07/2014   Renal insufficiency 12/02/2012   Preventative health care 11/13/2010   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 Reports he wanted some donuts before, for the boost for energy. Some pain noted in the right shoulder.  PAIN:  Are you having pain? Yes NPRS scale: 1/10 Pain location: Shoulder Pain orientation: Posterior  PAIN TYPE: aching Pain description: constant  Aggravating factors: Moving it Relieving factors: Nothing    TODAY'S TREATMENT:    [place] Adult  PT Treatment/Exercise - 06/23/21 0001       Transfers   Transfers Sit to Stand;Stand to Sit;Squat Pivot Transfers    Sit to Stand 3: Mod assist;With upper extremity assist;From chair/3-in-1    Sit to Stand Details Verbal cues for sequencing;Verbal cues for technique;Verbal cues for  precautions/safety;Verbal cues for safe use of DME/AE    Sit to Stand Details (indicate cue type and reason) Initial sit <> stand from w/c requiring Mod A. Upon standing due to significant tone in RUE unable to get hand placed onto RW. Transitioned to seated position and compelted stretching to RUE to allow for improved hand placement on AD. Second rep completed from w/c, with PT placing R hand on RW prior to standing. Completed sit <> stand with Mod A from single PT, secondary PT standy by. Cont[place]ud cues for scooting forward, improved forward lean with faciliation proved, and hand placement for LUE.    Stand to Sit 3: Mod assist;4: Min assist    Stand to Sit Details (indicate cue type and reason) Verbal cues for sequencing;Verbal cues for technique    Stand to Sit Details [place]ud cues to reach back with LUE prior to descent.    Stand Pivot Transfers 3: Mod assist    Stand Pivot Transfer Details (indicate cue type and reason) with RW, completd stand pivot transfer from w/c <> mat, with Min - Mod A with intermittent assistance required for RLE movement. Continued cues for upright posture and maintain hip extension    Squat Pivot Transfers 3: Mod assist    Squat Pivot Transfer Details (indicate cue type and reason) from mat <> w/c, continued cues for weight shift to promtoe improved positioning and compeltion of transfer    Comments Prior to activities, R [place]er Ant AFO donned to RLE. With completion of sit <> stand patient able to stand for approx 1 minute with BUE support, but continous cues for hip extension and upright posture during standing.      Ambulation/Gait   Ambulation/Gait Yes    Ambulation/Gait Assistance 3: Mod assist    Ambulation/Gait Assistance Details trialed with R hand orthotic on RW initially, but removed due to signficant tone noted in RUE. R Blue [place]er AFO donned and R [employer]. Patient demonstrating significant tone in RLE making advancement difficulty with Mod - Max A  for RLE needed at times. Primary PT helping with advancement of lower extremity, secondary PT helping with posture and managemetn of AD.    Ambulation Distance (Feet) 5 Feet   x 2   Assistive device Rolling walker;Other (Comment)   R AFO   Gait Pattern Step-to pattern;Decreased stride length;Decreased step length - right;Decreased step length - left;Decreased stance time - right;Decreased hip/knee flexion - right;Decreased dorsiflexion - right;Decreased weight shift to right;Right flexed knee in stance;Decreased trunk rotation;Narrow base of support;Poor foot clearance - right    Ambulation Surface Level;Indoor      Neuro Re-ed    Neuro Re-ed Details  Compelted faciliation and activity for improved forward lean. PT hand over hand with RUE, and PT providing hand over left shoulder, cues for patient to push trunk into hand to promote improved forward lean, completed x 10 reps.             Reviewed the following HEP with patient and caregiver. Educated on importance of standing daily at sink for improved activity tolerance.  Access Code: [facility] URL: [email or web address] Date: 06/23/2021 Prepared by: Baldomero Lamy  Program Notes Also complete the following:  Forward lean x 20 reps with Caregiver  Standing 3x daily for at least 30 sec with caregiver   Exercises Seated Hip Adduction Isometrics with Ball - 1-2 x daily - 5 x weekly - 1-2 sets - 10 reps - 3 sec hold   PATIENT EDUCATION: Education details: Progress toward LTGs; HEP Review Person educated: Patient and Caregiver Education method: Explanation Education comprehension: verbalized understanding   HOME EXERCISE PROGRAM: Forward lean x20 with caregiver; standing 3x daily for at least 30 sec with caregiver; [employer]Bridge 12/29/20 [facility])   PT Short Term Goals       PT SHORT TERM GOAL #1   Title Pt and caregiver will report standing at least 1x a day at the sink for improved standing tolerance. ALL STGS DUE  05/31/21    Baseline 05/26/21: </= 3 days a week for ~10 minutes before needing to sit down to rest.    Status Partially Met      PT SHORT TERM GOAL #2   Title Pt will perform sit to stand transfer with min A from mat table with RW.    Baseline 05/27/21: conmtinues to fluctuate between min/mod assist    Status Not Met      PT SHORT TERM GOAL #3   Title Pt will perform squat pivot transfer from w/c <> mat table with mod A in order to demo decr caregiver burden.    Baseline 05/27/21: min/mod assist for squat or stand pivot with RW    Status Achieved      PT SHORT TERM GOAL #4   Title Pt will ambulate at least 71' with RW with min A +2 in order to demo improved functional mobility.    Baseline 05/27/21: met in session    Status Achieved              PT Long Term Goals      PT LONG TERM GOAL #1   Title Pt will be able to perform progression of/finalized HEP and perform standing consistently at home with caregiver assist,  TARGET 06/28/21    Baseline pt's son reports working on exercises at home with caregiver - pt not performing standing at home with caregivers; completing exercises intermittent at home will benefit from ongoing revisions/additions   Time 8    Period Weeks    Status On-going    Target Date 08/03/2021     PT LONG TERM GOAL #2   Title Pt will be able to perform all edge of bed scooting with min A for improved ease of transfers, decreased caregiver burden    Baseline pt continues to require cues min assist to laterally scoot to L, and mod assist and cues to laterally scoot to R; Min A-Mod A required for scooting tasks   Time 8    Period Weeks    Status On-going      PT LONG TERM GOAL #3   Title Caregiver will report standing at sink or RW at home, for at least 5 minutes, at least 5 days per week, with min guard>supervision, for improved standing tolerance for ADL participation.    Baseline inconsistent standing at home; pt/caregiver reports not standing at home   Time 8     Period Weeks    Status On-going      PT LONG TERM GOAL #4   Title Pt will be able to perform wheelchair transfer squat pivot with min A for improved independence vs. sliding board transfer with min guard, in order to  decrease caregiver burden    Baseline pt needing mod A to min A with stand pivot transfer with RW, depends on the day; Mod A required on 06/23/21   Time 8    Period Weeks    Status On-going     PT LONG TERM GOAL #5   Title Pt will ambulate at least 56' with RW with mod A x1 in order to demo improved functional mobility.    Baseline last assessed on 04/21/21 with pt able to ambulate 35-40 x1' with min/mod A x2 therapists with w/c follow; variations (15-30') dependent upon tone.    Time 8    Period Weeks    Status On-going   Target Date      PT LONG TERM GOAL #6   Title Pt will perform sit <> stands with RW consistently with min A from w/c and mat table in order to demo improved functional transfers to decr caregiver burden.    Baseline needed mod A on 05/03/21; min - mod A   Time 8    Period Weeks    Status On-going               Plan - 06/23/21 1852     Clinical Impression Statement Today's skilled PT session included assesment of paitent's progress toward goals. Patient continue to require Min - Mod A with all transfers, with cues required for positioning and technique to promote improved safety. With ambulation today, patient demonstrating significant heavy RUE tone and RLE, with inability to grasp onto RW with use of hand grip and RLE requiring Mod - Max A for advancement despite use of AFO and shoe cover. Continued NMR focused on improved forward lean and reviewed HEP. Continued to enfource compliance with HEP, especially with standing to promote improved standing tolerance and weight bearing. Will continue to benefit from skilled PT for current POC and allow patient to obtain R AFO. WIll continue per POC.    Personal Factors and Comorbidities Age;Time since onset  of injury/illness/exacerbation    Examination-Activity Limitations Locomotion Level;Transfers;Sit;Stand;Toileting;Dressing;Hygiene/Grooming;Self Feeding;Bed Mobility;Bend;Bathing    Examination-Participation Restrictions Community Activity;Shop;Meal Prep;Yard Work    [employer]chant [employer] officer Evolving/Moderate complexity    Rehab Potential Fair    PT Frequency 2x / week    PT Duration 12 weeks    PT Treatment/Interventions ADLs/Self Care Home Management;Electrical Stimulation;DME Instruction;Gait training;Functional mobility training;Therapeutic activities;Therapeutic exercise;Balance training;Neuromuscular re-education;Manual techniques;Patient/family education;Orthotic Fit/Training;Wheelchair mobility training;Passive range of motion;Dry needling;Taping    PT Next Visit Plan NMES to quads concurrent with ex's (need to confirm no contraindications, it is in the POC) continued to work on right LE stretching and strengthening; standing balance/tolerance, gait with RW with 1 person assist/tech for standby and wheelchair follow/ 2 person assist with tone is strong    PT Home Exercise Plan Forward lean x20 with caregiver; standing 3x daily for at least 30 sec with caregiver; [place] 12/29/20    Consulted and Agree with Plan of Care Patient    Family Member Consulted Cargiver present during part of session.            Jones Bales, PT, DPT 06/23/2021, 6:55 PM

## 2021-06-28 ENCOUNTER — Other Ambulatory Visit: Payer: Self-pay

## 2021-06-28 ENCOUNTER — Ambulatory Visit: Payer: No Typology Code available for payment source | Admitting: Physical Therapy

## 2021-06-28 ENCOUNTER — Ambulatory Visit: Payer: No Typology Code available for payment source | Admitting: Occupational Therapy

## 2021-06-28 ENCOUNTER — Encounter: Payer: Self-pay | Admitting: Occupational Therapy

## 2021-06-28 ENCOUNTER — Encounter: Payer: Self-pay | Admitting: Physical Therapy

## 2021-06-28 DIAGNOSIS — R41840 Attention and concentration deficit: Secondary | ICD-10-CM

## 2021-06-28 DIAGNOSIS — M25621 Stiffness of right elbow, not elsewhere classified: Secondary | ICD-10-CM

## 2021-06-28 DIAGNOSIS — I69351 Hemiplegia and hemiparesis following cerebral infarction affecting right dominant side: Secondary | ICD-10-CM

## 2021-06-28 DIAGNOSIS — M6281 Muscle weakness (generalized): Secondary | ICD-10-CM

## 2021-06-28 DIAGNOSIS — R2681 Unsteadiness on feet: Secondary | ICD-10-CM

## 2021-06-28 DIAGNOSIS — M25611 Stiffness of right shoulder, not elsewhere classified: Secondary | ICD-10-CM

## 2021-06-28 DIAGNOSIS — R278 Other lack of coordination: Secondary | ICD-10-CM

## 2021-06-28 NOTE — Therapy (Signed)
OUTPATIENT PHYSICAL THERAPY TREATMENT NOTE   Patient Name: Trevor Anderson MRN: [id number] DOB:1956-04-23, 66 y.o., male Today's Date: 06/28/2021  PCP: Biagio Borg, MD REFERRING PROVIDER: Biagio Borg, MD   PT End of Session - 06/28/21 1405     Visit Number 41    Number of Visits 91    Date for PT Re-Evaluation 08/03/21    Authorization Type VA auth# [id number] (15 visits); VA has approved additional 15 PT visits to 05/26/2021; 15 visits through 08/03/21    Authorization - Visit Number 12    Authorization - Number of Visits 15    PT Start Time [place] During Treatment Gait belt;Other (comment)   AFO   Activity Tolerance Patient tolerated treatment well;Patient limited by fatigue   limited by incr clonus.   Behavior During Therapy [facility] for tasks assessed/performed              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   CONGESTIVE HEART FAILURE 11/12/2007   DIABETES MELLITUS, TYPE II 11/20/2008   HYPERLIPIDEMIA 11/12/2007   HYPERTENSION 11/12/2007   TACHYCARDIA 01/24/2010   Past Surgical History:  Procedure Laterality Date   BUBBLE STUDY  03/16/2020   Procedure: BUBBLE STUDY;  Surgeon: Buford Dresser, MD;  Location: [place];  Service: Cardiovascular;;   LOOP RECORDER INSERTION N/A 03/23/2020   Procedure: LOOP RECORDER INSERTION;  Surgeon: Thompson Grayer, MD;  Location: [place] [facility];  Service: Cardiovascular;  Laterality: N/A;   TEE WITHOUT CARDIOVERSION N/A 03/16/2020   Procedure: TRANSESOPHAGEAL ECHOCARDIOGRAM (TEE);  Surgeon: Buford Dresser, MD;  Location: [facility] ENDOSCOPY;  Service: Cardiovascular;  Laterality: N/A;   TONSILLECTOMY  1970   Patient Active Problem List   Diagnosis Date Noted   Depressive disorder 04/07/2021   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   Impacted cerumen, bilateral 04/07/2021   Unspecified hearing loss, bilateral 04/07/2021   Hemiparesis as late effect of  cerebrovascular accident (CVA) ([place]) 04/02/2020   Cerebrovascular accident (CVA) ([place])    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   Hypertensive urgency 03/12/2020   Acute on chronic systolic CHF (congestive heart failure) ([place]) 03/12/2020   AKI (acute kidney injury) ([place]) 03/12/2020   Fall at home, initial encounter 03/12/2020   Right hip pain 03/12/2020   Hypokalemia 03/12/2020   Coronary artery disease involving native coronary artery of native heart without angina pectoris 09/07/2014   Renal insufficiency 12/02/2012   Preventative health care 11/13/2010   Secondary cardiomyopathy ([place]) 01/24/2010   TACHYCARDIA 01/24/2010   Type 2 diabetes mellitus without complication, with long-term current use of insulin ([place]) 11/20/2008   Mixed hyperlipidemia 11/12/2007   Essential hypertension 11/12/2007   CONGESTIVE HEART FAILURE 11/12/2007    REFERRING DIAG: Stroke  THERAPY DIAG:  Hemiplegia and hemiparesis following cerebral infarction affecting right dominant side (HCC)  Unsteadiness on feet  Muscle weakness (generalized)  PERTINENT HISTORY: CVA Nov 2021 w/ R hemiparesis   PRECAUTIONS: Fall, dense R hemiparesis  SUBJECTIVE: No changes. Took his meds before he came today.   PAIN:  Are you having pain? Yes NPRS scale: 1/10 Pain location: Shoulder Pain orientation: Posterior  PAIN TYPE: aching Pain description: constant  Aggravating factors: Moving it Relieving factors: Nothing, sitting still.     TODAY'S TREATMENT:     [facility] Adult PT Treatment/Exercise - 06/28/21 1455       Bed Mobility   Sit to Supine Minimal Assistance - Patient > 75%   to bring  BLE onto mat table     Transfers   Squat Pivot Transfers 3: Mod assist    Squat Pivot Transfer Details (indicate cue type and reason) From w/c > mat table      Knee/Hip Exercises: Aerobic   Other Aerobic [place] from wheelchair with rubber blocks used for wheelchair stability with BLE only  on level 1.5 x 6  minutes for strengthening, full range of motion, incr RLE use and activity tolerance. Cues to maintain speed.      Knee/Hip Exercises: Supine   Quad Sets AROM;Strengthening;Right;1 set;15 reps;PROM    Quad Sets Limitations Trace quad activation noted with some reps. Needing verbal/tactile cues throughout. Additional overpressure perform for additional stretch.    Heel Slides AAROM;Strengthening;Right;1 set;15 reps;PROM    Heel Slides Limitations With pillow case on R foot to decr friction, and use of sliding board, pt needing PROM for knee flexion and AAROM when extending leg back into knee extension. Needing assist from therapist to help keep pt's foot on sliding board.    Hip Adduction Isometric Strengthening;2 sets;10 reps    Hip Adduction Isometric Limitations In hookyling, 2 sets of 10 yoga block squeezes.    Bridges AROM;Both;1 set;10 reps    Bridges Limitations x10 reps with tactile cues from therapist at pt's R ASIS for incr RLE activation. An additional x5 reps with pt squeezing yoga block for hip ADD    Other Supine Knee/Hip Exercises x5 reps bent knee fall outs with RLE, verbal/tactile to keep LLE stable. With RLE extended and pillow case on R foot on sliding board, needing PROM into hip ABD and AAROM with pt bringing RLE back into hip ADD x15 reps                HOME EXERCISE PROGRAM: Forward lean x20 with caregiver; standing 3x daily for at least 30 sec with caregiver; [facility] 12/29/20 [facility])   PT Short Term Goals       PT SHORT TERM GOAL #1   Title Pt and caregiver will report standing at least 1x a day at the sink for improved standing tolerance. ALL STGS DUE 05/31/21    Baseline 05/26/21: </= 3 days a week for ~10 minutes before needing to sit down to rest.    Status Partially Met      PT SHORT TERM GOAL #2   Title Pt will perform sit to stand transfer with min A from mat table with RW.    Baseline 05/27/21: conmtinues to fluctuate between min/mod assist    Status  Not Met      PT SHORT TERM GOAL #3   Title Pt will perform squat pivot transfer from w/c <> mat table with mod A in order to demo decr caregiver burden.    Baseline 05/27/21: min/mod assist for squat or stand pivot with RW    Status Achieved      PT SHORT TERM GOAL #4   Title Pt will ambulate at least 5' with RW with min A +2 in order to demo improved functional mobility.    Baseline 05/27/21: met in session    Status Achieved              PT Long Term Goals      PT LONG TERM GOAL #1   Title Pt will be able to perform progression of/finalized HEP and perform standing consistently at home with caregiver assist,  TARGET 06/28/21    Baseline pt's son reports working on exercises at home  with caregiver - pt not performing standing at home with caregivers; completing exercises intermittent at home will benefit from ongoing revisions/additions   Time 8    Period Weeks    Status On-going    Target Date 08/03/2021     PT LONG TERM GOAL #2   Title Pt will be able to perform all edge of bed scooting with min A for improved ease of transfers, decreased caregiver burden    Baseline pt continues to require cues min assist to laterally scoot to L, and mod assist and cues to laterally scoot to R; Min A-Mod A required for scooting tasks   Time 8    Period Weeks    Status On-going      PT LONG TERM GOAL #3   Title Caregiver will report standing at sink or RW at home, for at least 5 minutes, at least 5 days per week, with min guard>supervision, for improved standing tolerance for ADL participation.    Baseline inconsistent standing at home; pt/caregiver reports not standing at home   Time 8    Period Weeks    Status On-going      PT LONG TERM GOAL #4   Title Pt will be able to perform wheelchair transfer squat pivot with min A for improved independence vs. sliding board transfer with min guard, in order to decrease caregiver burden    Baseline pt needing mod A to min A with stand pivot transfer  with RW, depends on the day; Mod A required on 06/23/21   Time 8    Period Weeks    Status On-going     PT LONG TERM GOAL #5   Title Pt will ambulate at least 33' with RW with mod A x1 in order to demo improved functional mobility.    Baseline last assessed on 04/21/21 with pt able to ambulate 35-40 x1' with min/mod A x2 therapists with w/c follow; variations (15-30') dependent upon tone.    Time 8    Period Weeks    Status On-going   Target Date      PT LONG TERM GOAL #6   Title Pt will perform sit <> stands with RW consistently with min A from w/c and mat table in order to demo improved functional transfers to decr caregiver burden.    Baseline needed mod A on 05/03/21; min - mod A   Time 8    Period Weeks    Status On-going               Plan - 06/23/21 1852     Clinical Impression Statement Today's skilled session focused on RLE>LLE strengthening and ROM activities today using the SciFit and supine on mat table. Continued to educate on performing HEP and standing at home. Pt needing mod A to transfer from w/c > mat table and min A for sit > supine. Pt will resume therapy once he receives his R AFO. Will continue to progress towards LTGs.    Personal Factors and Comorbidities Age;Time since onset of injury/illness/exacerbation    Examination-Activity Limitations Locomotion Level;Transfers;Sit;Stand;Toileting;Dressing;Hygiene/Grooming;Self Feeding;Bed Mobility;Bend;Bathing    Examination-Participation Restrictions Community Activity;Shop;Meal Prep;Yard Work    [employer]chant [employer] officer Evolving/Moderate complexity    Rehab Potential Fair    PT Frequency 2x / week    PT Duration 12 weeks    PT Treatment/Interventions ADLs/Self Care Home Management;Electrical Stimulation;DME Instruction;Gait training;Functional mobility training;Therapeutic activities;Therapeutic exercise;Balance training;Neuromuscular re-education;Manual techniques;Patient/family education;Orthotic  Fit/Training;Wheelchair mobility training;Passive range of motion;Dry needling;Taping  PT Next Visit Plan NMES to quads concurrent with ex's (need to confirm no contraindications, it is in the POC) continued to work on right LE stretching and strengthening; standing balance/tolerance, gait with RW with 1 person assist/tech for standby and wheelchair follow/ 2 person assist with tone is strong    PT Home Exercise Plan Forward lean x20 with caregiver; standing 3x daily for at least 30 sec with caregiver; [place] 12/29/20    Consulted and Agree with Plan of Care Patient    Family Member Consulted [employer] present during part of session.            Arliss Journey, PT, DPT 06/28/2021, 2:05 PM

## 2021-06-28 NOTE — Therapy (Signed)
[facility] [address] [place], [place], [address] Phone: [phone]   Fax:  [phone]  Occupational Therapy Treatment & Occupational Therapy Discharge  Patient Details  Name: Trevor Anderson MRN: [id number] Date of Birth: [birth year] Referring Provider (OT): Deland Pretty, NP   Encounter Date: 06/28/2021   OT End of Session - 06/28/21 1447     Visit Number 34    Number of Visits 34    Date for OT Re-Evaluation 07/28/21    Authorization Type [facility]    Authorization Time Period 45 visits   Expires 07/28/21    Authorization - Visit Number 38    Authorization - Number of Visits 45    Progress Note Due on Visit 6    OT Start Time 1445    OT Stop Time 1530    OT Time Calculation (min) 45 min    Activity Tolerance Patient tolerated treatment well    Behavior During Therapy [facility] for tasks assessed/performed             OCCUPATIONAL THERAPY DISCHARGE SUMMARY  Visits from Start of Care: 34  Current functional level related to goals / functional outcomes: Pt has received extensive education re: adapted strategies and equipment for increasing independence. Pt has improved with increased participation in some ADLs at home with bathing, using an urinal and some assistance dressing.   Remaining deficits: Pt has severe spasticity that limits overall progress.   Education / Equipment: HEP and education   Patient agrees to discharge. Patient goals were partially met. Patient is being discharged due to maximized rehab potential. .      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   CONGESTIVE HEART FAILURE 11/12/2007   DIABETES MELLITUS, TYPE II 11/20/2008   HYPERLIPIDEMIA 11/12/2007   HYPERTENSION 11/12/2007   TACHYCARDIA 01/24/2010    Past Surgical History:  Procedure Laterality Date   BUBBLE STUDY  03/16/2020   Procedure: BUBBLE STUDY;  Surgeon: Buford Dresser, MD;   Location: [facility];  Service: Cardiovascular;;   LOOP RECORDER INSERTION N/A 03/23/2020   Procedure: LOOP RECORDER INSERTION;  Surgeon: Thompson Grayer, MD;  Location: [facility];  Service: Cardiovascular;  Laterality: N/A;   TEE WITHOUT CARDIOVERSION N/A 03/16/2020   Procedure: TRANSESOPHAGEAL ECHOCARDIOGRAM (TEE);  Surgeon: Buford Dresser, MD;  Location: [facility] ENDOSCOPY;  Service: Cardiovascular;  Laterality: N/A;   TONSILLECTOMY  1970    There were no vitals filed for this visit.   Subjective Assessment - 06/28/21 1431     Subjective  "nothing much"    Patient is accompanied by: --   caregiver   Pertinent History HLD, HTN, DM, CHF    Limitations Fall Risk. Max/Total stand pivot transfer w RW. Loop Recorder (no ESTIM)    Currently in Pain? No/denies    Pain Score 0-No pain    Pain Onset --             Supine on mat upon arrival. OT reviewed discharge plan and goals. Discussed out of bed and back in bed schedule and routine for patient as caregiver reports spending a lot of time in bed during the day. Reports fairly inconsistent between patient and caregiver. Caregiver reports that patient is now using the urinal pretty consistently for increasing continence with toileting.   Transition from supine to edge of mat. Pt required total assistance for transitioning to edge of mat with recovery x 3 for falling backward and unable to regain balance  while seated edge of mat.    Splint Adjustments to RUE wrist orthosis.                        OT Short Term Goals - 06/21/21 1638       OT SHORT TERM GOAL #1   Title Pt and caregivers will be independent with HEP    Time 4    Period Weeks    Status Achieved    Target Date 02/09/21      OT SHORT TERM GOAL #2   Title Pt will complete UB dressing with min A for pull over short sleeve shirt.    Baseline mod A    Time 4    Period Weeks    Status Achieved   per pt report - completing with min A for  hemi arm.     OT SHORT TERM GOAL #3   Title Pt and caregiver will verbalize understanding of adapted strategies and/or equipment for completing toilet and tub transfers with mod A consistently.    Baseline currently mod/max and not getting to shower.    Time 4    Period Weeks    Status Not Met   max A for transfers and unable to fully get to tub bench and shower safely at this time and not getting on [facility] consistently at home. still using bed pan. 03/08/21     OT SHORT TERM GOAL #4   Title Pt will achieve 90* of PROM for RUE shoulder flexion to reduce risk of malpositioning and increased pain.    Time 4    Period Weeks    Status Achieved   90* with RUE shoulder flexion 03/08/21     OT SHORT TERM GOAL #5   Title Pt and caregivers will verbalize and demonstrate understanding of wear and care of any splints and/or orthoses PRN.    Time 4    Period Weeks    Status Achieved               OT Long Term Goals - 06/28/21 1448       OT LONG TERM GOAL #1   Title Pt and caregivers will be independent with and updated HEPs    Time 10    Period Weeks    Status Achieved      OT LONG TERM GOAL #2   Title Pt will complete toilet and tub transfers consistently with min assistance with adapted strategies and equipment PRN    Baseline mod A for stand pivot    Time 10    Period Weeks    Status Deferred   Pt consistently mod - max transfer. 03/08/21     OT LONG TERM GOAL #3   Title Pt will complete bathing with min A    Baseline mod-max    Time 10    Period Weeks    Status Achieved   simulated min A in the clinic 03/08/21 - pt able to reach 75% with washcloth while seated in tilt in space chair. pt reports doing tihs at home     OT [place] #4   Title Pt will demonstrate composite flexion/extension of 15% or greater in RUE for preparing for active grasp/release and increasing functional use.    Baseline trace extension    Time 10    Period Weeks    Status Not Met   trace  extension/approx 10% flexion 03/08/21, 06/28/21     OT  LONG TERM GOAL #5   Title Pt will achieve -20 degrees of elbow extension in RUE of PROM or decreasing risk of skin breakdown    Baseline -40*    Time 10    Period Weeks    Status On-going   -25* 03/08/21, -30* 06/28/21     OT LONG TERM GOAL #6   Title Pt and son will verbalize understanding of home modifications/equipment and/or adapted strategies for increasing independence with transfers for bathing and toileting and increasing safety and independence with ADLs/IADLs. (BSC, sponge baths vs tub bench, long handled sponge, cutting food, etc)    Time 12    Period Weeks    Status Achieved   have reviewed strategies and pt reports "we do more than we used to" 06/28/21                  Plan - 06/28/21 1537     Clinical Impression Statement Pt has maximized OT potential and is discharging from OT at this time. Educated patient and caregiver about receiving another OT referral if needed after UE injections occur.    OT Occupational Profile and History Problem Focused Assessment - Including review of records relating to presenting problem    Occupational performance deficits (Please refer to evaluation for details): IADL's;ADL's;Leisure    Body Structure / Function / Physical Skills Tone;Strength;ADL;FMC;Mobility;GMC;ROM;IADL;UE functional use;Decreased knowledge of use of DME    Cognitive Skills Problem Solve    Rehab Potential Good    Clinical Decision Making Limited treatment options, no task modification necessary    Comorbidities Affecting Occupational Performance: None    Modification or Assistance to Complete Evaluation  No modification of tasks or assist necessary to complete eval    OT Frequency 1x / week    OT Duration 6 weeks   1x/week for 6 weeks at recertification last visit (05/26/21)   OT Treatment/Interventions Self-care/ADL training;Manual Therapy;Patient/family education;Electrical Stimulation;Neuromuscular  education;Functional Mobility Training;Passive range of motion;Cognitive remediation/compensation;Therapeutic exercise;Moist Heat;DME and/or AE instruction;Therapeutic activities;Aquatic Therapy;Splinting    Plan OT d/c    OT Home Exercise Plan self PROM    Consulted and Agree with Plan of Care Patient;Family member/caregiver             Patient will benefit from skilled therapeutic intervention in order to improve the following deficits and impairments:   Body Structure / Function / Physical Skills: Tone, Strength, ADL, FMC, Mobility, GMC, ROM, IADL, UE functional use, Decreased knowledge of use of DME Cognitive Skills: Problem Solve     Visit Diagnosis: Hemiplegia and hemiparesis following cerebral infarction affecting right dominant side (HCC)  Unsteadiness on feet  Muscle weakness (generalized)  Other lack of coordination  Stiffness of right elbow, not elsewhere classified  Stiffness of right shoulder, not elsewhere classified  Attention and concentration deficit    Problem List Patient Active Problem List   Diagnosis Date Noted   Depressive disorder 04/07/2021   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   Impacted cerumen, bilateral 04/07/2021   Unspecified hearing loss, bilateral 04/07/2021   Hemiparesis as late effect of cerebrovascular accident (CVA) ([place]) 04/02/2020   Cerebrovascular accident (CVA) ([place])    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   Hypertensive urgency 03/12/2020   Acute on chronic systolic CHF (congestive heart failure) ([place]) 03/12/2020   AKI (acute kidney injury) ([place]) 03/12/2020   Fall at home, initial encounter 03/12/2020   Right hip pain 03/12/2020   Hypokalemia 03/12/2020   Coronary artery disease involving native coronary artery of native  heart without angina pectoris 09/07/2014   Renal insufficiency 12/02/2012   Preventative health care 11/13/2010   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

## 2021-07-11 ENCOUNTER — Encounter: Payer: Self-pay | Admitting: Adult Health

## 2021-07-11 ENCOUNTER — Other Ambulatory Visit: Payer: Self-pay

## 2021-07-11 ENCOUNTER — Ambulatory Visit: Payer: No Typology Code available for payment source

## 2021-07-11 ENCOUNTER — Ambulatory Visit (INDEPENDENT_AMBULATORY_CARE_PROVIDER_SITE_OTHER): Payer: No Typology Code available for payment source | Admitting: Adult Health

## 2021-07-11 ENCOUNTER — Telehealth: Payer: Self-pay

## 2021-07-11 VITALS — BP 134/85 | HR 105

## 2021-07-11 DIAGNOSIS — Z9189 Other specified personal risk factors, not elsewhere classified: Secondary | ICD-10-CM | POA: Diagnosis not present

## 2021-07-11 DIAGNOSIS — I6523 Occlusion and stenosis of bilateral carotid arteries: Secondary | ICD-10-CM

## 2021-07-11 DIAGNOSIS — M6281 Muscle weakness (generalized): Secondary | ICD-10-CM

## 2021-07-11 DIAGNOSIS — I69351 Hemiplegia and hemiparesis following cerebral infarction affecting right dominant side: Secondary | ICD-10-CM | POA: Diagnosis not present

## 2021-07-11 DIAGNOSIS — I6330 Cerebral infarction due to thrombosis of unspecified cerebral artery: Secondary | ICD-10-CM

## 2021-07-11 DIAGNOSIS — G8113 Spastic hemiplegia affecting right nondominant side: Secondary | ICD-10-CM

## 2021-07-11 DIAGNOSIS — R2681 Unsteadiness on feet: Secondary | ICD-10-CM

## 2021-07-11 NOTE — Patient Instructions (Addendum)
Continue working with PT as scheduled ? ?You will be called to schedule evaluation for possible sleep apnea ? ?Please let me know if you need assistance to start botox injections for spasticity  ? ?You will be contacted to complete follow up tests as requested by Dr. Leonie Man including carotid ultrasound and transcranial Doppler ? ?Continue aspirin 81 mg daily  and atorvastatin for secondary stroke prevention ? ?Continue to follow up with PCP regarding cholesterol and blood pressure management  ?Maintain strict control of hypertension with blood pressure goal below 130/90 and cholesterol with LDL cholesterol (bad cholesterol) goal below 70 mg/dL.  ? ?Signs of a Stroke? Follow the BEFAST method:  ?Balance Watch for a sudden loss of balance, trouble with coordination or vertigo ?Eyes Is there a sudden loss of vision in one or both eyes? Or double vision?  ?Face: Ask the person to smile. Does one side of the face droop or is it numb?  ?Arms: Ask the person to raise both arms. Does one arm drift downward? Is there weakness or numbness of a leg? ?Speech: Ask the person to repeat a simple phrase. Does the speech sound slurred/strange? Is the person confused ? ?Time: If you observe any of these signs, call 911. ? ? ? ? ? ? ?Thank you for coming to see [place] at [facility]. I hope we have been able to provide you high quality care today. ? ?You may receive a patient satisfaction survey over the next few weeks. We would appreciate your feedback and comments so that we may continue to improve ourselves and the health of our patients. ? ?

## 2021-07-11 NOTE — Telephone Encounter (Signed)
Message received from Ihor Austin, NP asking if patient can have electrotherapy on his right side for residual right spastic hemiparesis post stroke having and ILR. ? ?Consulted with Dr. Johney Frame and per Dr. Johney Frame, patient is able to have electrotherapy with ILR.  ? ?Patient called and made aware. ?

## 2021-07-11 NOTE — Progress Notes (Signed)
?[facility] ?[id number] ?[place]. [place] [address] ?[phone] ? ?     OFFICE FOLLOW UP NOTE ? ?[name] ?Date of Birth:  [birth year] ?Medical Record Number:  [id number]  ? ?Referring MD: Trevor Crater, NP ? ?Reason for Referral: Stroke ? ?Chief Complaint  ?Patient presents with  ? Follow-up  ?  RM 3 with Son Trevor Anderson ?Pt is well and stable, no new concerns   ?  ? ?HPI:  ? ?Update 07/11/2021 JM: 66 year old male who returns for stroke follow-up accompanied by his son.  Previously seen 3 months ago by Dr. Pearlean Anderson.  Reports continued significant right spastic hemiparesis.  He has since been discharged from OT as he maximized rehab potential and severe spasticity limiting overall progress, of note, prior OT session 3/7 reported some improvement with ADLs.  Continues to work with PT making some progress. Is able to stand/pivot to transfer. Is nonambulatory but has been taking a couple steps during PT sessions. Just received AFO brace last week. IVA did send to pain clinic for botox injections but apparently he was seen by pain clinic that did not do Botox injections, they are in the process of setting him up with a different pain clinic. Per son, therapy wants to try electrotherapy but unable to due to loop recorder.  Son questions having this removed as it has been implanted over 1 year without evidence of A fib. Son also notes some occasional short-term memory loss which has been stable since his stroke. He does not do any type of memory exercises.  Patient does admit to occasional insomnia, daytime fatigue and snoring. He has not previously underwent sleep study. Denies new stroke/TIA symptoms.  Compliant on aspirin and atorvastatin without side effects.  Blood pressure today 134/85.    Loop recorder has not shown atrial fibrillation thus far. Dr. Pearlean Anderson requested completion of TCD and carotid Doppler studies but not yet completed.  No further concerns at this time. ? ? ? ? ? ?History provided  for reference purposes only ?Initial visit 04/07/2021 Dr. Pearlean Anderson: [name] is a 66 year old African-American male seen today for initial office consultation visit.  He is accompanied by his son.  History is obtained from them and review of referral notes and electronic medical records.  I have available imaging results and PACS but not actual films for personal review today.  Patient has past medical history of hypertension, hyperlipidemia, diabetes, congestive heart failure, cardiomyopathy and stroke.  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  MRI scan report from that time shows a large 3.4 cm left basal ganglia as well as smaller right basal ganglia and left paramedian pontine lacunar infarcts.  CT angiogram showed no large vessel occlusion but showed moderate bilateral M2, left P2 and ACA stenosis.  2D echo showed ejection fraction of 45 to 50%.  TEE showed a small PFO which is felt to be incidental.  Lower extremity venous Dopplers were negative.  LDL cholesterol was elevated at 173 mg percent and hemoglobin A1c at 6.8.  Patient had a loop recorder inserted and so far paroxysmal A. fib has not been found.  Patient unfortunately has not done well and continues to have significant spastic right hemiplegia and unable to even bend his fingers or toes or any movement.  He is barely able to stand even with 1 person assist and is unable to walk.  He still currently doing outpatient physical and occupational therapy.  His speech and swallowing have  improved and he can eat all right speaks clearly without dysarthria.  [facility] through [facility] management clinic but he has not yet gotten appointment.  Patient is tolerating aspirin and Plavix with minor bruising and no bleeding.  His blood pressure is under good control today it is 137/88.  He is tolerating Lipitor well without muscle aches and pains.  He has not had any follow-up lipid profile  checked recently or vascular studies done for follow-up.  He has no new complaints. ? ? ? ? ?ROS:   ?14 system review of systems is positive for those listed in HPI and all other systems negative ? ?PMH:  ?Past Medical History:  ?Diagnosis Date  ? CARDIOMYOPATHY, 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  ? CONGESTIVE HEART FAILURE 11/12/2007  ? DIABETES MELLITUS, TYPE II 11/20/2008  ? HYPERLIPIDEMIA 11/12/2007  ? HYPERTENSION 11/12/2007  ? TACHYCARDIA 01/24/2010  ? ? ?Social History:  ?Social History  ? ?Socioeconomic History  ? Marital status: Single  ?  Spouse name: Not on file  ? Number of children: Not on file  ? Years of education: Not on file  ? Highest education level: Not on file  ?Occupational History  ? Not on file  ?Tobacco Use  ? Smoking status: Never  ? Smokeless tobacco: Never  ?Substance and Sexual Activity  ? Alcohol use: Yes  ?  Comment: social  ? Drug use: No  ? Sexual activity: Not on file  ?Other Topics Concern  ? Not on file  ?Social History Narrative  ? Not on file  ? ?Social Determinants of Health  ? ?Financial Resource Strain: Not on file  ?Food Insecurity: Not on file  ?Transportation Needs: Not on file  ?Physical Activity: Not on file  ?Stress: Not on file  ?Social Connections: Not on file  ?Intimate Partner Violence: Not on file  ? ? ?Medications:   ?Current Outpatient Medications on File Prior to Visit  ?Medication Sig Dispense Refill  ? amLODipine (NORVASC) 10 MG tablet Take 1 tablet (10 mg total) by mouth daily. 90 tablet 0  ? aspirin 81 MG EC tablet Take 81 mg by mouth daily. Swallow whole.    ? atorvastatin (LIPITOR) 80 MG tablet Take 1 tablet (80 mg total) by mouth daily. 90 tablet 0  ? baclofen (LIORESAL) 10 MG tablet Take 10 mg by mouth 3 (three) times daily.    ? carvedilol (COREG) 12.5 MG tablet Take 3 tablets (37.5 mg total) by mouth 2 (two) times daily with a meal. 540 tablet 0  ? Cholecalciferol 50 MCG (2000 UT) TABS Take 1 tablet by mouth daily.    ? furosemide (LASIX) 40 MG tablet Take 1 tablet (40  mg total) by mouth daily. 90 tablet 0  ? Lancets MISC 1 application by Does not apply route daily. 100 each 11  ? omeprazole (PRILOSEC) 20 MG capsule Take 20 mg by mouth daily.    ? potassium chloride (KLOR-CON) 10 MEQ tablet TAKE THREE TABLETS BY MOUTH AT BEDTIME (TAKE WITH FOOD)    ? senna (SENOKOT) 8.6 MG tablet Take 1 tablet by mouth 2 (two) times daily.    ? sertraline (ZOLOFT) 100 MG tablet Take 1 tablet by mouth daily.    ? UNABLE TO FIND Take 1 tablet by mouth daily. Med Name: ciprodegel bisulfate 75 mg (Patient not taking: Reported on 07/11/2021)    ? ?No current facility-administered medications on file prior to visit.  ? ? ?Allergies:  No  Known Allergies ? ?Physical Exam ?Today's Vitals  ? 07/11/21 1107  ?BP: 134/85  ?Pulse: (!) 105  ? ?There is no height or weight on file to calculate BMI.  ? ?General: Obese very pleasant middle-aged African-American male, seated, in no evident distress ?Head: head normocephalic and atraumatic.   ?Neck: supple with no carotid or supraclavicular bruits ?Cardiovascular: regular rate and rhythm, no murmurs ?Musculoskeletal: no deformity ?Skin:  no rash/petichiae ?Vascular:  Normal pulses all extremities ? ?Neurologic Exam ?Mental Status: Awake and fully alert. Oriented to place and time. Recent memory subjectively impaired and remote memory intact. Attention span, concentration and fund of knowledge appropriate during visit. Mood and affect appropriate. Unable to complete further memory testing due to time constraints ?Cranial Nerves: Pupils equal, briskly reactive to light. Extraocular movements full without nystagmus. Visual fields full to confrontation. Hearing intact. Facial sensation intact.  Moderate right lower facial weakness., tongue, palate moves normally and symmetrically.  ?Motor: Normal strength and tone left upper and lower extremity ?RUE: 1/5 proximal, 0/5 distal, increased tone throughout ?RLE: 0/5 with increased tone throughout ?Sensory.: intact to touch ,  pinprick , position and vibratory sensation.  ?Coordination: Rapid alternating movements normal on left side. Finger-to-nose and heel-to-shin performed accurately on left side. ?Gait and Station: [employer]

## 2021-07-11 NOTE — Therapy (Signed)
?OUTPATIENT PHYSICAL THERAPY TREATMENT NOTE ? ? ?Patient Name: Trevor Anderson ?MRN: [id number] ?DOB:1955/12/14, 66 y.o., male ?Today's Date: 07/11/2021 ? ?PCP: Biagio Borg, MD ?REFERRING PROVIDER: Biagio Borg, MD ? ? PT End of Session - 07/11/21 1016   ? ? Visit Number 42   ? Number of Visits 46   ? Date for PT Re-Evaluation 08/03/21   ? Authorization Type VA auth# [id number] (15 visits); VA has approved additional 15 PT visits to 05/26/2021; 15 visits through 08/03/21   ? Authorization - Visit Number 13   ? Authorization - Number of Visits 15   ? PT Start Time 1016   ? PT Stop Time 1058   ? PT Time Calculation (min) 42 min   ? Equipment Utilized During Treatment Gait belt;Other (comment)   ? Activity Tolerance Patient tolerated treatment well;Patient limited by fatigue   ? Behavior During Therapy [facility] for tasks assessed/performed   ? ?  ?  ? ?  ? ? ? ?Past Medical History:  ?Diagnosis Date  ? CARDIOMYOPATHY, 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  ? CONGESTIVE HEART FAILURE 11/12/2007  ? DIABETES MELLITUS, TYPE II 11/20/2008  ? HYPERLIPIDEMIA 11/12/2007  ? HYPERTENSION 11/12/2007  ? TACHYCARDIA 01/24/2010  ? ?Past Surgical History:  ?Procedure Laterality Date  ? BUBBLE STUDY  03/16/2020  ? Procedure: BUBBLE STUDY;  Surgeon: Buford Dresser, MD;  Location: [place];  Service: Cardiovascular;;  ? LOOP RECORDER INSERTION N/A 03/23/2020  ? Procedure: LOOP RECORDER INSERTION;  Surgeon: Thompson Grayer, MD;  Location: [place] CV LAB;  Service: Cardiovascular;  Laterality: N/A;  ? TEE WITHOUT CARDIOVERSION N/A 03/16/2020  ? Procedure: TRANSESOPHAGEAL ECHOCARDIOGRAM (TEE);  Surgeon: Buford Dresser, MD;  Location: [facility] ENDOSCOPY;  Service: Cardiovascular;  Laterality: N/A;  ? TONSILLECTOMY  1970  ? ?Patient Active Problem List  ? Diagnosis Date Noted  ? Depressive disorder 04/07/2021  ? 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  ? Impacted cerumen, bilateral 04/07/2021  ? Unspecified hearing loss, bilateral 04/07/2021  ?  Hemiparesis as late effect of cerebrovascular accident (CVA) ([place]) 04/02/2020  ? Cerebrovascular accident (CVA) ([place])   ? 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  ? Hypertensive urgency 03/12/2020  ? Acute on chronic systolic CHF (congestive heart failure) ([place]) 03/12/2020  ? AKI (acute kidney injury) ([place]) 03/12/2020  ? Fall at home, initial encounter 03/12/2020  ? Right hip pain 03/12/2020  ? Hypokalemia 03/12/2020  ? Coronary artery disease involving native coronary artery of native heart without angina pectoris 09/07/2014  ? Renal insufficiency 12/02/2012  ? Preventative health care 11/13/2010  ? 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 TACHYCARDIA 01/24/2010  ? Type 2 diabetes mellitus without complication, with long-term current use of insulin ([place]) 11/20/2008  ? Mixed hyperlipidemia 11/12/2007  ? Essential hypertension 11/12/2007  ? CONGESTIVE HEART FAILURE 11/12/2007  ? ? ?REFERRING DIAG: Stroke ? ?THERAPY DIAG:  ?Hemiplegia and hemiparesis following cerebral infarction affecting right dominant side ([place]) ? ?Unsteadiness on feet ? ?Muscle weakness (generalized) ? ?PERTINENT HISTORY: CVA Nov 2021 w/ R hemiparesis  ? ?PRECAUTIONS: Fall, dense R hemiparesis ? ?SUBJECTIVE: Patient reports that he got his brace on Friday. Wore the AFO on Friday 6-7 hours, denies any skin issues. No other new changes. Patient reports he has stood at home with assistance.  ? ?PAIN:  ?Are you having pain? Yes ?NPRS scale: 1/10 ?Pain location: R Hip ?Pain orientation: Posterior  ?PAIN TYPE: aching ?Pain description: constant  ?Aggravating factors: moving it ?Relieving factors: Nothing, sitting still.  ? ? ? ?TODAY'S TREATMENT:  ? ? ?  [facility] Adult PT Treatment/Exercise - 07/11/21 0001   ? ?  ? Transfers  ? Transfers Sit to Stand;Stand to Sit;Squat Pivot Transfers   ? Sit to Stand 3: Mod assist;With upper extremity assist;From chair/3-in-1   ? Sit to Stand Details Verbal cues for sequencing;Verbal cues for  technique;Verbal cues for precautions/safety;Verbal cues for safe use of DME/AE   ? Sit to Stand Details (indicate cue type and reason) complete sit <> stand from w/c requiring Mod A initially, Secondary PT utilized to help stabilize patient, while primary PT had to stretch RUE to get placed onto RW. No hand orthotic utilized. Transitioned into lateral stand pivot transfer to mat. Rest of sit <> stands completed from mat with Mod A progressing to Min A at end. Cues for foot position and improved lateral weight shift onto RLE.  Pt continue to require Mod cues for positioning prior to completion of sit <> stand and faciliation for improved posture upon standing.   ? Stand to Sit 4: Min assist   ? Stand to Sit Details (indicate cue type and reason) Verbal cues for sequencing;Verbal cues for technique   ? Stand to Sit Details cues to reach back for surface prior to descent   ? Stand Pivot Transfers 3: Mod assist   ? Stand Pivot Transfer Details (indicate cue type and reason) with use of RW from w/c <> mat. Cues for improved lateral step length, intermittent assistance required for RLE due to tone. Cues for upright posture.   ?  ? Ambulation/Gait  ? Ambulation/Gait Yes   ? Ambulation/Gait Assistance 3: Mod assist   ? Ambulation/Gait Assistance Details Use of personal R anterior thuasne AFO and blue shoe cover to assist with foot clearance. One therapist assisting with bringing RLE through during swing phase and balance, 2nd therapist helping to steer RW and help at trunk for posture. 3rd therapist provided w/c follow for safety. Significant increased in tone noted initially with patient able to ambulate 80ft. PT providing max A for RLE forward progression due to tone. Cues throughout for incr stance time on RLE and tall posture, as well as weight shift with faciliation provided.   ? Ambulation Distance (Feet) 6 Feet   15  ? Assistive device Rolling walker   R AFO  ? Gait Pattern Step-to pattern;Decreased stride  length;Decreased step length - right;Decreased step length - left;Decreased stance time - right;Decreased hip/knee flexion - right;Decreased dorsiflexion - right;Decreased weight shift to right;Right flexed knee in stance;Decreased trunk rotation;Narrow base of support;Poor foot clearance - right   ? Ambulation Surface Level;Indoor   ? Pre-Gait Activities Standing at RW: completed x 10 reps stepping forward with LLE to target and then back to midline for improved RLE weight shift. Cues for posture and faciliation at pelvis for weight shift.   ?  ? Self-Care  ? Self-Care Other Self-Care Comments   ? Other Self-Care Comments  PT educating on wear time for AFO, and monitor skin as increased wear time. Will need to reach back out to [facility] for Toe Tap for R Shoe. Son verbalized understanding.   ? ?  ?  ? ?  ? ? ? ? ? ? ?HOME EXERCISE PROGRAM: ?Forward lean x20 with caregiver; standing 3x daily for at least 30 sec with caregiver; [employer] 12/29/20 [facility]) ? ? PT Short Term Goals   ? ?  ? PT SHORT TERM GOAL #1  ? Title Pt and caregiver will report standing at least 1x a day at the  sink for improved standing tolerance. ALL STGS DUE 05/31/21   ? Baseline 05/26/21: </= 3 days a week for ~10 minutes before needing to sit down to rest.   ? Status Partially Met   ?  ? PT SHORT TERM GOAL #2  ? Title Pt will perform sit to stand transfer with min A from mat table with RW.   ? Baseline 05/27/21: conmtinues to fluctuate between min/mod assist   ? Status Not Met   ?  ? PT SHORT TERM GOAL #3  ? Title Pt will perform squat pivot transfer from w/c <> mat table with mod A in order to demo decr caregiver burden.   ? Baseline 05/27/21: min/mod assist for squat or stand pivot with RW   ? Status Achieved   ?  ? PT SHORT TERM GOAL #4  ? Title Pt will ambulate at least 73' with RW with min A +2 in order to demo improved functional mobility.   ? Baseline 05/27/21: met in session   ? Status Achieved   ? ?  ?  ? ?  ? ? ? PT Long Term Goals  ? ?   ? PT LONG TERM GOAL #1  ? Title Pt will be able to perform progression of/finalized HEP and perform standing consistently at home with caregiver assist,  [facility] 06/28/21   ? Baseline pt's son reports working on exercises at home with

## 2021-07-13 ENCOUNTER — Ambulatory Visit: Payer: No Typology Code available for payment source | Admitting: Physical Therapy

## 2021-07-14 ENCOUNTER — Telehealth: Payer: Self-pay | Admitting: Adult Health

## 2021-07-14 NOTE — Telephone Encounter (Signed)
VA pending faxed information  ?

## 2021-07-18 ENCOUNTER — Ambulatory Visit (INDEPENDENT_AMBULATORY_CARE_PROVIDER_SITE_OTHER): Payer: No Typology Code available for payment source

## 2021-07-18 DIAGNOSIS — I639 Cerebral infarction, unspecified: Secondary | ICD-10-CM

## 2021-07-20 ENCOUNTER — Ambulatory Visit: Payer: No Typology Code available for payment source | Admitting: Physical Therapy

## 2021-07-20 DIAGNOSIS — I69351 Hemiplegia and hemiparesis following cerebral infarction affecting right dominant side: Secondary | ICD-10-CM

## 2021-07-20 DIAGNOSIS — R2681 Unsteadiness on feet: Secondary | ICD-10-CM

## 2021-07-20 DIAGNOSIS — M6281 Muscle weakness (generalized): Secondary | ICD-10-CM

## 2021-07-20 LAB — CUP PACEART REMOTE DEVICE CHECK
Date Time Interrogation Session: 20230326231239
Implantable Pulse Generator Implant Date: 20211130

## 2021-07-20 NOTE — Therapy (Signed)
?OUTPATIENT PHYSICAL THERAPY TREATMENT NOTE/DISCHARGE SUMMARY ? ? ?Patient Name: Trevor Anderson ?MRN: [id number] ?DOB:1955/09/30, 66 y.o., male ?Today's Date: 07/20/2021 ? ?PCP: Biagio Borg, MD ?REFERRING PROVIDER: Biagio Borg, MD ? ? PT End of Session - 07/20/21 1407   ? ? Visit Number 38   ? Number of Visits 46   ? Date for PT Re-Evaluation 08/03/21   ? Authorization Type VA auth# [id number] (15 visits); VA has approved additional 15 PT visits to 05/26/2021; 15 visits through 08/03/21   ? Authorization - Visit Number 14   ? Authorization - Number of Visits 15   ? PT Start Time 1400   ? PT Stop Time 2458   ? PT Time Calculation (min) 43 min   ? Equipment Utilized During Treatment Gait belt   ? Activity Tolerance Patient tolerated treatment well;Patient limited by fatigue   ? Behavior During Therapy [facility] for tasks assessed/performed   ? ?  ?  ? ?  ? ? ? ?Past Medical History:  ?Diagnosis Date  ? CARDIOMYOPATHY, 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  ? CONGESTIVE HEART FAILURE 11/12/2007  ? DIABETES MELLITUS, TYPE II 11/20/2008  ? HYPERLIPIDEMIA 11/12/2007  ? HYPERTENSION 11/12/2007  ? TACHYCARDIA 01/24/2010  ? ?Past Surgical History:  ?Procedure Laterality Date  ? BUBBLE STUDY  03/16/2020  ? Procedure: BUBBLE STUDY;  Surgeon: Buford Dresser, MD;  Location: [place];  Service: Cardiovascular;;  ? LOOP RECORDER INSERTION N/A 03/23/2020  ? Procedure: LOOP RECORDER INSERTION;  Surgeon: Thompson Grayer, MD;  Location: [place] CV LAB;  Service: Cardiovascular;  Laterality: N/A;  ? TEE WITHOUT CARDIOVERSION N/A 03/16/2020  ? Procedure: TRANSESOPHAGEAL ECHOCARDIOGRAM (TEE);  Surgeon: Buford Dresser, MD;  Location: [facility] ENDOSCOPY;  Service: Cardiovascular;  Laterality: N/A;  ? TONSILLECTOMY  1970  ? ?Patient Active Problem List  ? Diagnosis Date Noted  ? Depressive disorder 04/07/2021  ? 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  ? Impacted cerumen, bilateral 04/07/2021  ? Unspecified hearing loss, bilateral 04/07/2021  ?  Hemiparesis as late effect of cerebrovascular accident (CVA) ([place]) 04/02/2020  ? Cerebrovascular accident (CVA) ([place])   ? 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  ? Hypertensive urgency 03/12/2020  ? Acute on chronic systolic CHF (congestive heart failure) ([place]) 03/12/2020  ? AKI (acute kidney injury) ([place]) 03/12/2020  ? Fall at home, initial encounter 03/12/2020  ? Right hip pain 03/12/2020  ? Hypokalemia 03/12/2020  ? Coronary artery disease involving native coronary artery of native heart without angina pectoris 09/07/2014  ? Renal insufficiency 12/02/2012  ? Preventative health care 11/13/2010  ? 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 TACHYCARDIA 01/24/2010  ? Type 2 diabetes mellitus without complication, with long-term current use of insulin ([place]) 11/20/2008  ? Mixed hyperlipidemia 11/12/2007  ? Essential hypertension 11/12/2007  ? CONGESTIVE HEART FAILURE 11/12/2007  ? ? ?REFERRING DIAG: Stroke ? ?THERAPY DIAG:  ?Hemiplegia and hemiparesis following cerebral infarction affecting right dominant side ([place]) ? ?Muscle weakness (generalized) ? ?Unsteadiness on feet ? ?PERTINENT HISTORY: CVA Nov 2021 w/ R hemiparesis  ? ?PRECAUTIONS: Fall, dense R hemiparesis ? ?SUBJECTIVE: Reports that he is standing every other day. Has been wearing his brace at home with no issues. Pt's son and pt reports that transfers are getting easier with it.   ? ?PAIN:  ?Are you having pain? No ? ? ? ? ? ?TODAY'S TREATMENT:  ? ? ? [place] Adult PT Treatment/Exercise - 07/20/21 1430   ? ?  ? Transfers  ? Transfers Sit to Stand;Stand to Sit;Squat Pivot Transfers   ?  Sit to Stand 3: Mod assist;With upper extremity assist;From chair/3-in-1   ? Sit to Stand Details Verbal cues for sequencing;Verbal cues for technique;Verbal cues for precautions/safety;Verbal cues for safe use of DME/AE   ? Sit to Stand Details (indicate cue type and reason) Performed sit <> stand from pt's w/c and mat table with mod A (improving to min A with  incr reps). Once in standing one therapist providing min A for balance while 2nd therapist placing RUE on handle of RW. Cues for foot position and improved lateral weight shift onto RLE.  Pt continues to require cues for positioning prior to completion of sit <> stand and faciliation for improved posture upon standing.   ? Stand to Sit 4: Min assist   ? Stand to Sit Details (indicate cue type and reason) Verbal cues for sequencing;Verbal cues for technique   ? Stand to Sit Details Cues to reach back, pt with uncontrolled descent back to w/c after gait   ? Stand Pivot Transfers 3: Mod assist   ? Stand Pivot Transfer Details (indicate cue type and reason) with use of RW to mat table, cues for weight shifting to R with assist needed to move RLE during transfer due to tone.   ?  ? Ambulation/Gait  ? Ambulation/Gait Yes   ? Ambulation/Gait Assistance 3: Mod assist   ? Ambulation/Gait Assistance Details Use of personal R anterior thuasne AFO and blue shoe cover to assist with foot clearance. W/c follow needed. One therapist assisting with bringing RLE through during swing phase and balance, 2nd therapist helping to steer RW and help at trunk for posture. Cues throughout for incr stance time on RLE, but pt continues to take a quick hurried step with LLE. Pt fatigued after 18' and needing to rest.   ? Ambulation Distance (Feet) 18 Feet   x1  ? Assistive device Rolling walker   R AFO  ? Gait Pattern Step-to pattern;Decreased stride length;Decreased step length - right;Decreased step length - left;Decreased stance time - right;Decreased hip/knee flexion - right;Decreased dorsiflexion - right;Decreased weight shift to right;Right flexed knee in stance;Decreased trunk rotation;Narrow base of support;Poor foot clearance - right   ? Ambulation Surface Level;Indoor   ? ?  ?  ? ?  ? ? ? ? ? ?HOME EXERCISE PROGRAM: ? ? Finalized HEP and reviewed with pt and pt's son. Pt has been performing inconsistently at home, but does report  standing at the sink.  ? ?Access Code: [email or web address] ?URL: [email or web address] ?Date: 07/20/2021 ?Prepared by: Janann August ? ?Program Notes ?Also complete the following: Forward lean x 20 reps with Caregiver (think about leaning more towards the right) ? ?Standing 3x daily for as long as you can with caregiver/family. Think about tall posture, bringing your bellybutton forwards and squeezing your bottom ? ?Exercises ?- Seated Hip Adduction Isometrics with Ball  - 1-2 x daily - 5 x weekly - 1-2 sets - 10 reps - 3 sec hold ?- Supine Bridge  - 2 x daily - 5 x weekly - 1 sets - 10 reps ?- Bent Knee Fallouts  - 1 x daily - 7 x weekly - 2 sets - 10 reps ?- Mini Squats with Walker and Chair  - 1 x daily - 5 x weekly - 2 sets - 5 reps - standing at sink with family.  ? ?PHYSICAL THERAPY DISCHARGE SUMMARY ? ?Visits from Start of Care: 62 ? ?Current functional level related to goals / functional outcomes: ?See LTGs ?  ?Remaining deficits: ?  Dense R hemiparesis, incr RLE tone, impaired postural control, impaired balance, decr strength.  ?  ?Education / Equipment: ?HEP  ? ?Patient agrees to discharge. Patient goals were partially met. Patient is being discharged due to maximized rehab potential. Pt to take a break from therapy at this time to work on exercises at home. Pt to return to PT in the future after being more medically managed (figuring out botox for incr tone of RUE/RLE). ? ? ?PATIENT EDUCATION: ?Education details: Discussed with pt and pt's son that he has met his max potential for PT at this time. Would be best to go on a break from therapy for a few months while pt is working on exercises at home (including standing) and not until pt receives botox due to significant tone in RUE/RLE. Discussed that pt would need a new referral from the [place] to return. Updated/finalized HEP handout.  ?Person educated: Patient and Child(ren) ?Education method: Explanation and Handouts ?Education comprehension: verbalized  understanding ? ? ? PT Short Term Goals   ? ?  ? PT SHORT TERM GOAL #1  ? Title Pt and caregiver will report standing at least 1x a day at the sink for improved standing tolerance. ALL STGS DUE 05/31/21   ? [employer]

## 2021-07-27 NOTE — Progress Notes (Signed)
Carelink Summary Report / Loop Recorder 

## 2021-08-02 NOTE — Telephone Encounter (Signed)
VA Berkley Harvey: [id number] (exp. 04/07/21 to 10/04/21), sent Lupita Leash a message to reach out to the patient to schedule.  ?

## 2021-08-04 ENCOUNTER — Other Ambulatory Visit: Payer: Self-pay | Admitting: Psychiatry

## 2021-08-04 ENCOUNTER — Telehealth: Payer: Self-pay | Admitting: Adult Health

## 2021-08-04 DIAGNOSIS — I639 Cerebral infarction, unspecified: Secondary | ICD-10-CM

## 2021-08-04 NOTE — Telephone Encounter (Signed)
Trevor Anderson with [facility] called regarding Pt order entered in for a TCD with bubble. Claims order is entered incorrectly would like it fixed and a call back.  ?[phone] ?

## 2021-08-04 NOTE — Telephone Encounter (Signed)
I placed an order for the TCD, thanks

## 2021-08-04 NOTE — Telephone Encounter (Signed)
Called Trevor Anderson and informed her Dr Billey Gosling has sent in new TCD order. She verbalized understanding, appreciation, and will call patient and schedule test. . ? ?

## 2021-08-22 ENCOUNTER — Ambulatory Visit (INDEPENDENT_AMBULATORY_CARE_PROVIDER_SITE_OTHER): Payer: No Typology Code available for payment source

## 2021-08-22 DIAGNOSIS — I639 Cerebral infarction, unspecified: Secondary | ICD-10-CM | POA: Diagnosis not present

## 2021-08-22 LAB — CUP PACEART REMOTE DEVICE CHECK
Date Time Interrogation Session: 20230428230528
Implantable Pulse Generator Implant Date: 20211130

## 2021-08-25 ENCOUNTER — Ambulatory Visit (HOSPITAL_COMMUNITY)
Admission: RE | Admit: 2021-08-25 | Discharge: 2021-08-25 | Disposition: A | Discharge status: Home / Self Care | Payer: No Typology Code available for payment source | Source: Ambulatory Visit | Attending: Adult Health | Admitting: Adult Health

## 2021-08-25 ENCOUNTER — Telehealth: Payer: Self-pay

## 2021-08-25 ENCOUNTER — Ambulatory Visit (HOSPITAL_BASED_OUTPATIENT_CLINIC_OR_DEPARTMENT_OTHER)
Admission: RE | Admit: 2021-08-25 | Discharge: 2021-08-25 | Disposition: A | Discharge status: Home / Self Care | Payer: No Typology Code available for payment source | Source: Ambulatory Visit | Attending: Psychiatry | Admitting: Psychiatry

## 2021-08-25 DIAGNOSIS — I6523 Occlusion and stenosis of bilateral carotid arteries: Secondary | ICD-10-CM | POA: Diagnosis not present

## 2021-08-25 DIAGNOSIS — I639 Cerebral infarction, unspecified: Secondary | ICD-10-CM | POA: Insufficient documentation

## 2021-08-25 DIAGNOSIS — G8113 Spastic hemiplegia affecting right nondominant side: Secondary | ICD-10-CM

## 2021-08-25 NOTE — Telephone Encounter (Signed)
Referral placed to Dr. 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  Thank you. ?

## 2021-08-25 NOTE — Progress Notes (Signed)
VASCULAR LAB ? ? ? ?Carotid duplex has been performed. ? ?See CV proc for preliminary results. ? ? ?Arzu Mcgaughey, RVT ?08/25/2021, 12:04 PM ? ?

## 2021-08-25 NOTE — Telephone Encounter (Signed)
I called pt and we discussed results. He verbalized understanding/ appreciation for the call. Pt would like to move forward botox if NP is agreeable.  ?

## 2021-08-25 NOTE — Progress Notes (Signed)
VASCULAR LAB ? ? ? ?TCD bubble study has been performed. ? ?See CV proc for preliminary results. ? ? ?Exa Bomba, RVT ?08/25/2021, 12:05 PM ? ?

## 2021-08-25 NOTE — Addendum Note (Signed)
Addended by: Frann Rider L on: 08/25/2021 04:28 PM ? ? Modules accepted: Orders ? ?

## 2021-08-25 NOTE — Telephone Encounter (Signed)
-----   Message from Ihor Austin, NP sent at 08/25/2021  1:19 PM EDT ----- ?Recent carotid ultrasound showed minimal carotid artery stenosis.  Continue current treatment plan. ?

## 2021-08-29 NOTE — Telephone Encounter (Signed)
I called the pt back and advised that referral has been placed.  ?Pt verbalized understanding and appreciation for the call.  ?

## 2021-09-05 NOTE — Progress Notes (Signed)
Carelink Summary Report / Loop [email or web address].c 

## 2021-09-07 ENCOUNTER — Encounter: Payer: Self-pay | Admitting: Neurology

## 2021-09-07 ENCOUNTER — Ambulatory Visit (INDEPENDENT_AMBULATORY_CARE_PROVIDER_SITE_OTHER): Payer: No Typology Code available for payment source | Admitting: Neurology

## 2021-09-07 VITALS — BP 111/76 | HR 81 | Ht 71.0 in | Wt 237.0 lb

## 2021-09-07 DIAGNOSIS — I639 Cerebral infarction, unspecified: Secondary | ICD-10-CM | POA: Diagnosis not present

## 2021-09-07 DIAGNOSIS — G8113 Spastic hemiplegia affecting right nondominant side: Secondary | ICD-10-CM | POA: Diagnosis not present

## 2021-09-07 NOTE — Progress Notes (Addendum)
Chief Complaint  Patient presents with   Follow-up    Rm 14. Referral from [facility] to Dr. Krista Blue req evaluation for botox for post stroke spasticity.      ASSESSMENT AND PLAN  [name] is a 66 y.o. 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   MEDICAL HISTORY: [name] is 66 year old male, accompanied by his son, referred by Janett Billow for evaluation of botulism toxin injection for spastic right hemiparesis  Chart reviewed, past medical history Hypertension Hyperlipidemia Diabetes Congestive heart failure, cardiomyopathy, Stroke  He suffered acute stroke in November 2021, personally reviewed MRI of the brain, left basal ganglion acute infarction, and left paramedian pontine lacunar infarction  CT angiogram showed no large vessel disease, moderate bilateral M2, left P2, ACA stenosis  Echocardiogram 40 to 50%  TEE small PFO, felt to be incidental  Lower extremity venous Doppler was negative, LDL was 173, A1c was 6.8, he had loop recorder placement  He now lives at home with his son, came in wheelchair today, spent most of the time in sitting position, wearing right AFO, need help transfer, putting on clothes, complains of right shoulder elbow finger pain with passive stretch, he does have caregiver 7 hours each day   PHYSICAL EXAM:   Vitals:   09/07/21 1318  BP: 111/76  Pulse: 81  Weight: 237 lb (107.5 kg)  Height: 5\' 11"  (1.803 m)   Not recorded     Body mass index is 33.05 kg/m.  PHYSICAL EXAMNIATION:  Gen: NAD, conversant, well nourised, well groomed                      Cardiovascular: Regular rate rhythm, no peripheral edema, warm, nontender. Eyes: Conjunctivae clear without exudates or hemorrhage Neck: Supple, no carotid bruits. Pulmonary: Clear to auscultation bilaterally   NEUROLOGICAL EXAM:  MENTAL STATUS: Speech/cognition: Dysarthria, following commands, mild finger agnosia,  CRANIAL NERVES: CN II: Visual fields are full to confrontation. Pupils are round equal and briskly reactive to light. CN III, IV, VI: extraocular movement are normal. No ptosis. CN V: Facial sensation is intact to light touch CN VII: Right lower face weakness CN VIII: Hearing is normal to causal conversation. CN IX, X: Phonation is normal. CN XI: Head turning and shoulder shrug are intact  MOTOR: Spastic right hemiparesis, involving right upper and lower extremity, no antigravity movement, very painful on passive stretch of the right shoulder, elbow, able to straight out right hand, wearing a right AFO, mild fixed spasticity of right knee, tendency for right knee adduction, ankle plantarflexion   REFLEXES: Hyperreflexia of right side  SENSORY: No extinction  COORDINATION: There is no trunk or limb dysmetria noted.  GAIT/STANCE: Deferred  REVIEW OF SYSTEMS:  Full 14 system review of systems performed and notable only for as above All other review of systems were negative.   ALLERGIES: No Known Allergies  HOME MEDICATIONS: Current Outpatient Medications  Medication Sig Dispense Refill   aspirin 81 MG EC tablet Take 81 mg by mouth daily. Swallow whole.     baclofen (LIORESAL) 10 MG tablet Take 10 mg by mouth 3 (  three) times daily.     Cholecalciferol 50 MCG (2000 UT) TABS Take 1 tablet by mouth daily.     Lancets MISC 1 application by Does not apply route daily. 100 each 11   omeprazole (PRILOSEC) 20 MG capsule Take 20 mg by mouth daily.     potassium chloride (KLOR-CON) 10 MEQ tablet TAKE THREE TABLETS BY MOUTH AT BEDTIME (TAKE WITH FOOD)      senna (SENOKOT) 8.6 MG tablet Take 1 tablet by mouth 2 (two) times daily.     sertraline (ZOLOFT) 100 MG tablet Take 1 tablet by mouth daily.     amLODipine (NORVASC) 10 MG tablet Take 1 tablet (10 mg total) by mouth daily. 90 tablet 0   atorvastatin (LIPITOR) 80 MG tablet Take 1 tablet (80 mg total) by mouth daily. (Patient taking differently: Take 40 mg by mouth daily.) 90 tablet 0   carvedilol (COREG) 12.5 MG tablet Take 3 tablets (37.5 mg total) by mouth 2 (two) times daily with a meal. (Patient taking differently: Take 25 mg by mouth 2 (two) times daily with a meal. Take 1/2 tablet 2 times a day.) 540 tablet 0   furosemide (LASIX) 40 MG tablet Take 1 tablet (40 mg total) by mouth daily. 90 tablet 0   No current facility-administered medications for this visit.    PAST MEDICAL HISTORY: 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   CONGESTIVE HEART FAILURE 11/12/2007   DIABETES MELLITUS, TYPE II 11/20/2008   HYPERLIPIDEMIA 11/12/2007   HYPERTENSION 11/12/2007   TACHYCARDIA 01/24/2010    PAST SURGICAL HISTORY: Past Surgical History:  Procedure Laterality Date   BUBBLE STUDY  03/16/2020   Procedure: BUBBLE STUDY;  Surgeon: Jodelle Red, MD;  Location: [facility] ENDOSCOPY;  Service: Cardiovascular;;   LOOP RECORDER INSERTION N/A 03/23/2020   Procedure: LOOP RECORDER INSERTION;  Surgeon: Hillis Range, MD;  Location: [facility] INVASIVE CV LAB;  Service: Cardiovascular;  Laterality: N/A;   TEE WITHOUT CARDIOVERSION N/A 03/16/2020   Procedure: TRANSESOPHAGEAL ECHOCARDIOGRAM (TEE);  Surgeon: Jodelle Red, MD;  Location: [facility] ENDOSCOPY;  Service: Cardiovascular;  Laterality: N/A;   TONSILLECTOMY  1970    FAMILY HISTORY: Family History  Problem Relation Age of Onset   Kidney disease Mother     SOCIAL HISTORY: Social History   Socioeconomic History   Marital status: Single    Spouse name: Not on file   Number of children: Not on file   Years  of education: Not on file   Highest education level: Not on file  Occupational History   Not on file  Tobacco Use   Smoking status: Never   Smokeless tobacco: Never  Substance and Sexual Activity   Alcohol use: Yes    Comment: social   Drug use: No   Sexual activity: Not on file  Other Topics Concern   Not on file  Social History Narrative   Not on file   Social Determinants of Health   Financial Resource Strain: Not on file  Food Insecurity: Not on file  Transportation Needs: Not on file  Physical Activity: Not on file  Stress: Not on file  Social Connections: Not on file  Intimate Partner Violence: Not on file      Levert Feinstein, M.D. Ph.D.  [facility] [address], [address] [place], [place] [address] Ph: [phone] Fax: [phone]  CC:  [name], MD [address] [place],  [place] [address]  [name], MD

## 2021-09-20 ENCOUNTER — Telehealth: Payer: Self-pay

## 2021-09-20 NOTE — Telephone Encounter (Signed)
We received a call from the [place] regarding the most recent auth sent over, they are asking that a request for additional service form be sent back to them to request coverage of the patients follow up visit in September. Please advise if you are able to send that over to them?

## 2021-09-20 NOTE — Telephone Encounter (Signed)
I believe this is something that is handled by billing?

## 2021-09-22 ENCOUNTER — Ambulatory Visit (INDEPENDENT_AMBULATORY_CARE_PROVIDER_SITE_OTHER): Payer: No Typology Code available for payment source

## 2021-09-22 DIAGNOSIS — I639 Cerebral infarction, unspecified: Secondary | ICD-10-CM | POA: Diagnosis not present

## 2021-09-22 LAB — CUP PACEART REMOTE DEVICE CHECK
Date Time Interrogation Session: 20230531230838
Implantable Pulse Generator Implant Date: 20211130

## 2021-09-30 NOTE — Progress Notes (Signed)
Carelink Summary Report / Loop Recorder 

## 2021-10-26 ENCOUNTER — Ambulatory Visit (INDEPENDENT_AMBULATORY_CARE_PROVIDER_SITE_OTHER): Payer: No Typology Code available for payment source

## 2021-10-26 DIAGNOSIS — I639 Cerebral infarction, unspecified: Secondary | ICD-10-CM

## 2021-10-27 LAB — CUP PACEART REMOTE DEVICE CHECK
Date Time Interrogation Session: 20230703231231
Implantable Pulse Generator Implant Date: 20211130

## 2021-11-14 ENCOUNTER — Telehealth: Payer: Self-pay | Admitting: Neurology

## 2021-11-14 NOTE — Telephone Encounter (Signed)
Pt is calling and wants to schedule Botox appointment. Pt is requesting a call back to re-schedule.

## 2021-11-15 NOTE — Telephone Encounter (Signed)
Mr. Gallentine called he let me know that I needed to call the [facility] and speak with nurse Judie Petit she handles the PA'S. He gave me her contact info [phone], I called and left her message.

## 2021-11-17 NOTE — Progress Notes (Signed)
Carelink Summary Report / Loop Recorder 

## 2021-11-21 NOTE — Telephone Encounter (Signed)
Received [facility] Auth for patient which includes Botox injections [id number]  11/16/21-05/15/22

## 2021-11-22 NOTE — Telephone Encounter (Signed)
Pt is calling to get an up-date on getting a Botox appointment.

## 2021-11-28 ENCOUNTER — Ambulatory Visit (INDEPENDENT_AMBULATORY_CARE_PROVIDER_SITE_OTHER): Payer: No Typology Code available for payment source

## 2021-11-28 DIAGNOSIS — I639 Cerebral infarction, unspecified: Secondary | ICD-10-CM | POA: Diagnosis not present

## 2021-11-28 LAB — CUP PACEART REMOTE DEVICE CHECK
Date Time Interrogation Session: 20230805231138
Implantable Pulse Generator Implant Date: 20211130

## 2021-11-30 NOTE — Telephone Encounter (Signed)
Returned pt's call, got him scheduled for his Botox Injection.

## 2021-12-01 NOTE — Telephone Encounter (Signed)
Patient is scheduled for 12/07/21 with Dr. Terrace Arabia.  Botox [facility] [place] # [id number] (11/16/21-05/15/22)

## 2021-12-07 ENCOUNTER — Ambulatory Visit: Payer: No Typology Code available for payment source | Admitting: Neurology

## 2021-12-07 ENCOUNTER — Ambulatory Visit (INDEPENDENT_AMBULATORY_CARE_PROVIDER_SITE_OTHER): Payer: No Typology Code available for payment source | Admitting: Neurology

## 2021-12-07 VITALS — BP 119/76 | HR 88

## 2021-12-07 DIAGNOSIS — G8113 Spastic hemiplegia affecting right nondominant side: Secondary | ICD-10-CM | POA: Diagnosis not present

## 2021-12-07 DIAGNOSIS — I639 Cerebral infarction, unspecified: Secondary | ICD-10-CM

## 2021-12-07 NOTE — Progress Notes (Signed)
Chief Complaint  Patient presents with   Procedure    Rm 14.      ASSESSMENT AND PLAN  Trevor Anderson is a 66 y.o. 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,  Limited range of motion of right shoulder, elbow, painful on passive stretch,  This is his first electrical stimulation guided xeomin injection for spastic right hemiparesis, focused on right upper extremity, used xeomin 600 units  Right pectoralis major 100 units Right latissimus dorsi 100 units Right brachialis 100 units Right biceps 50 units Pronator teres 50 units Right palmaris longus 50 units Right flexor digitorum profundus 50 units Right flexor digitorum superficialis 50 units Right flexor ulnaris 50 units  DIAGNOSTIC DATA (LABS, IMAGING, TESTING) - I reviewed patient records, labs, notes, testing and imaging myself where available.   MEDICAL HISTORY: Trevor Anderson is 66 year old male, accompanied by his son, referred by Shanda Bumps for evaluation of botulism toxin injection for spastic right hemiparesis  Chart reviewed, past medical history Hypertension Hyperlipidemia Diabetes Congestive heart failure, cardiomyopathy, Stroke  He suffered acute stroke in November 2021, personally reviewed MRI of the brain, left basal ganglion acute infarction, and left paramedian pontine lacunar infarction  CT angiogram showed no large vessel disease, moderate bilateral M2, left P2, ACA stenosis  Echocardiogram 40 to 50%  TEE small PFO, felt to be incidental  Lower extremity venous Doppler was negative, LDL was 173, A1c was 6.8, he had loop recorder placement  He now lives at home with his son, came in wheelchair today, spent most of the time in sitting position, wearing right AFO, need help transfer, putting on clothes, complains of right shoulder elbow finger pain with passive stretch, he does have caregiver 7 hours each day  Update December 07, 2021: Electrical stimulation guided  xeomin injection for spastic right hemiparesis, we use xeomin 600 units daily, focus on right upper extremity   PHYSICAL EXAM:   Vitals:   12/07/21 1533  BP: 119/76  Pulse: 88   PHYSICAL EXAMNIATION:  Gen: NAD, conversant, well nourised, well groomed                     Cardiovascular: Regular rate rhythm, no peripheral edema, warm, nontender. Eyes: Conjunctivae clear without exudates or hemorrhage Neck: Supple, no carotid bruits. Pulmonary: Clear to auscultation bilaterally   NEUROLOGICAL EXAM:  MENTAL STATUS: Speech/cognition: Dysarthria, following commands, mild finger agnosia,  CRANIAL NERVES: CN II: Visual fields are full to confrontation. Pupils are round equal and briskly reactive to light. CN III, IV, VI: extraocular movement are normal. No ptosis. CN V: Facial sensation is intact to light touch CN VII: Right lower face weakness CN VIII: Hearing is normal to causal conversation. CN IX, X: Phonation is normal. CN XI: Head turning and shoulder shrug are intact  MOTOR: Spastic right hemiparesis, involving right upper and lower extremity, no antigravity movement, very painful on passive stretch of the right shoulder, elbow, able to straight out right hand, wearing a right AFO, mild fixed spasticity of right knee, tendency for right knee adduction, ankle plantarflexion   REFLEXES: Hyperreflexia of right side  SENSORY: No extinction  COORDINATION: There is no trunk or limb dysmetria noted.  GAIT/STANCE: Deferred  REVIEW OF SYSTEMS:  Full 14 system review of systems performed and notable only for as above All other review of systems were negative.   ALLERGIES: No Known Allergies  HOME MEDICATIONS: Current Outpatient Medications  Medication Sig Dispense Refill  amLODipine (NORVASC) 10 MG tablet Take 1 tablet (10 mg total) by mouth daily. 90 tablet 0   aspirin 81 MG EC tablet Take 81 mg by mouth daily. Swallow whole.     atorvastatin (LIPITOR) 80 MG tablet  Take 1 tablet (80 mg total) by mouth daily. (Patient taking differently: Take 40 mg by mouth daily.) 90 tablet 0   baclofen (LIORESAL) 10 MG tablet Take 10 mg by mouth 3 (three) times daily.     carvedilol (COREG) 12.5 MG tablet Take 3 tablets (37.5 mg total) by mouth 2 (two) times daily with a meal. (Patient taking differently: Take 25 mg by mouth 2 (two) times daily with a meal. Take 1/2 tablet 2 times a day.) 540 tablet 0   Cholecalciferol 50 MCG (2000 UT) TABS Take 1 tablet by mouth daily.     furosemide (LASIX) 40 MG tablet Take 1 tablet (40 mg total) by mouth daily. 90 tablet 0   Lancets MISC 1 application by Does not apply route daily. 100 each 11   omeprazole (PRILOSEC) 20 MG capsule Take 20 mg by mouth daily.     potassium chloride (KLOR-CON) 10 MEQ tablet TAKE THREE TABLETS BY MOUTH AT BEDTIME (TAKE WITH FOOD)     senna (SENOKOT) 8.6 MG tablet Take 1 tablet by mouth 2 (two) times daily.     sertraline (ZOLOFT) 100 MG tablet Take 1 tablet by mouth daily.     No current facility-administered medications for this visit.    PAST MEDICAL HISTORY: 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   CONGESTIVE HEART FAILURE 11/12/2007   DIABETES MELLITUS, TYPE II 11/20/2008   HYPERLIPIDEMIA 11/12/2007   HYPERTENSION 11/12/2007   TACHYCARDIA 01/24/2010    PAST SURGICAL HISTORY: Past Surgical History:  Procedure Laterality Date   BUBBLE STUDY  03/16/2020   Procedure: BUBBLE STUDY;  Surgeon: Jodelle Red, MD;  Location: [facility] ENDOSCOPY;  Service: Cardiovascular;;   LOOP RECORDER INSERTION N/A 03/23/2020   Procedure: LOOP RECORDER INSERTION;  Surgeon: Hillis Range, MD;  Location: [facility] INVASIVE CV LAB;  Service: Cardiovascular;  Laterality: N/A;   TEE WITHOUT CARDIOVERSION N/A 03/16/2020   Procedure: TRANSESOPHAGEAL ECHOCARDIOGRAM (TEE);  Surgeon: Jodelle Red, MD;  Location: [facility] ENDOSCOPY;  Service: Cardiovascular;  Laterality: N/A;   TONSILLECTOMY   1970    FAMILY HISTORY: Family History  Problem Relation Age of Onset   Kidney disease Mother     SOCIAL HISTORY: Social History   Socioeconomic History   Marital status: Single    Spouse name: Not on file   Number of children: Not on file   Years of education: Not on file   Highest education level: Not on file  Occupational History   Not on file  Tobacco Use   Smoking status: Never   Smokeless tobacco: Never  Substance and Sexual Activity   Alcohol use: Yes    Comment: social   Drug use: No   Sexual activity: Not on file  Other Topics Concern   Not on file  Social History Narrative   Not on file   Social Determinants of Health   Financial Resource Strain: Not on file  Food Insecurity: Not on file  Transportation Needs: Not on file  Physical Activity: Not on file  Stress: Not on file  Social Connections: Not on file  Intimate Partner Violence: Not on file      Levert Feinstein, M.D. Ph.D.  [facility] [address], [address] [place], [place]  [id number] Ph: [phone] Fax: [phone]  CC:  Biagio Borg, MD [place],  [place] [address]  Biagio Borg, MD

## 2021-12-07 NOTE — Progress Notes (Signed)
Xeomin 100 units x 6 vials NDC: 0259-1610-01 EXP: 2025-08 LOT: [id number]  B/B

## 2021-12-18 DIAGNOSIS — G8113 Spastic hemiplegia affecting right nondominant side: Secondary | ICD-10-CM | POA: Diagnosis not present

## 2021-12-18 DIAGNOSIS — I639 Cerebral infarction, unspecified: Secondary | ICD-10-CM

## 2021-12-18 MED ORDER — INCOBOTULINUMTOXINA 100 UNITS IM SOLR
600.0000 [IU] | INTRAMUSCULAR | Status: DC
  Administered 2021-12-18: 600 [IU] via INTRAMUSCULAR

## 2021-12-29 LAB — CUP PACEART REMOTE DEVICE CHECK
Date Time Interrogation Session: 20230907230736
Implantable Pulse Generator Implant Date: 20211130

## 2022-01-02 ENCOUNTER — Ambulatory Visit (INDEPENDENT_AMBULATORY_CARE_PROVIDER_SITE_OTHER): Payer: No Typology Code available for payment source

## 2022-01-02 DIAGNOSIS — I639 Cerebral infarction, unspecified: Secondary | ICD-10-CM

## 2022-01-04 NOTE — Progress Notes (Signed)
Carelink Summary Report / Loop Recorder 

## 2022-01-11 NOTE — Progress Notes (Unsigned)
[facility] [address] [place]. [place] [address] [phone]       OFFICE FOLLOW UP NOTE  [name] Date of Birth:  04/15/1956 Medical Record Number:  [id number]    Primary neurologist: Dr. Pearlean Anderson Reason for Referral: Stroke  No chief complaint on file.    HPI:   Update 01/12/2022 JM: Patient returns for 85-month stroke follow-up.  Overall stable.  Denies new stroke/TIA symptoms.  Continued right spastic hemiparesis.  Recently received initial Botox injection by Dr. Terrace Anderson ***.  Has repeat injection scheduled in November.  Continues working with therapy ***.  Compliant on aspirin atorvastatin.  Blood pressure well controlled.  Closely followed by PCP Dr. Jonny Anderson.  Loop recorder has not shown atrial fibrillation thus far.  Completed TCD and CUS which was largely unremarkable.  No further concerns at this time.        History provided for reference purposes only Update 07/11/2021 JM: 66 year old male who returns for stroke follow-up accompanied by his son.  Previously seen 3 months ago by Dr. Pearlean Anderson.  Reports continued significant right spastic hemiparesis.  He has since been discharged from OT as he maximized rehab potential and severe spasticity limiting overall progress, of note, prior OT session 3/7 reported some improvement with ADLs.  Continues to work with PT making some progress. Is able to stand/pivot to transfer. Is nonambulatory but has been taking a couple steps during PT sessions. Just received AFO brace last week. [facility] but apparently he was seen by pain clinic that did not do Botox injections, they are in the process of setting him up with a different pain clinic. Per son, therapy wants to try electrotherapy but unable to due to loop recorder.  Son questions having this removed as it has been implanted over 1 year without evidence of A fib. Son also notes some occasional short-term memory loss which has been  stable since his stroke. He does not do any type of memory exercises.  Patient does admit to occasional insomnia, daytime fatigue and snoring. He has not previously underwent sleep study. Denies new stroke/TIA symptoms.  Compliant on aspirin and atorvastatin without side effects.  Blood pressure today 134/85.    Loop recorder has not shown atrial fibrillation thus far. Dr. Pearlean Anderson requested completion of TCD and carotid Doppler studies but not yet completed.  No further concerns at this time.  Initial visit 04/07/2021 Dr. Pearlean Anderson: [name] is a 66 year old African-American male seen today for initial office consultation visit.  He is accompanied by his son.  History is obtained from them and review of referral notes and electronic medical records.  I have available imaging results and PACS but not actual films for personal review today.  Patient has past medical history of hypertension, hyperlipidemia, diabetes, congestive heart failure, cardiomyopathy and stroke.  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  MRI scan report from that time shows a large 3.4 cm left basal ganglia as well as smaller right basal ganglia and left paramedian pontine lacunar infarcts.  CT angiogram showed no large vessel occlusion but showed moderate bilateral M2, left P2 and ACA stenosis.  2D echo showed ejection fraction of 45 to 50%.  TEE showed a small PFO which is felt to be incidental.  Lower extremity venous Dopplers were negative.  LDL cholesterol was elevated at 173 mg percent and hemoglobin A1c at 6.8.  Patient had a loop recorder inserted and so  far paroxysmal A. fib has not been found.  Patient unfortunately has not done well and continues to have significant spastic right hemiplegia and unable to even bend his fingers or toes or any movement.  He is barely able to stand even with 1 person assist and is unable to walk.  He still currently doing outpatient physical and occupational therapy.  His speech and  swallowing have improved and he can eat all right speaks clearly without dysarthria.  [facility] clinic but he has not yet gotten appointment.  Patient is tolerating aspirin and Plavix with minor bruising and no bleeding.  His blood pressure is under good control today it is 137/88.  He is tolerating Lipitor well without muscle aches and pains.  He has not had any follow-up lipid profile checked recently or vascular studies done for follow-up.  He has no new complaints.     ROS:   14 system review of systems is positive for those listed in HPI and all other systems negative  PMH:  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   CONGESTIVE HEART FAILURE 11/12/2007   DIABETES MELLITUS, TYPE II 11/20/2008   HYPERLIPIDEMIA 11/12/2007   HYPERTENSION 11/12/2007   TACHYCARDIA 01/24/2010    Social History:  Social History   Socioeconomic History   Marital status: Single    Spouse name: Not on file   Number of children: Not on file   Years of education: Not on file   Highest education level: Not on file  Occupational History   Not on file  Tobacco Use   Smoking status: Never   Smokeless tobacco: Never  Substance and Sexual Activity   Alcohol use: Yes    Comment: social   Drug use: No   Sexual activity: Not on file  Other Topics Concern   Not on file  Social History Narrative   Not on file   Social Determinants of Health   Financial Resource Strain: Not on file  Food Insecurity: Not on file  Transportation Needs: Not on file  Physical Activity: Not on file  Stress: Not on file  Social Connections: Not on file  Intimate Partner Violence: Not on file    Medications:   Current Outpatient Medications on File Prior to Visit  Medication Sig Dispense Refill   amLODipine (NORVASC) 10 MG tablet Take 1 tablet (10 mg total) by mouth daily. 90 tablet 0   aspirin 81 MG EC tablet  Take 81 mg by mouth daily. Swallow whole.     atorvastatin (LIPITOR) 80 MG tablet Take 1 tablet (80 mg total) by mouth daily. (Patient taking differently: Take 40 mg by mouth daily.) 90 tablet 0   baclofen (LIORESAL) 10 MG tablet Take 10 mg by mouth 3 (three) times daily.     carvedilol (COREG) 12.5 MG tablet Take 3 tablets (37.5 mg total) by mouth 2 (two) times daily with a meal. (Patient taking differently: Take 25 mg by mouth 2 (two) times daily with a meal. Take 1/2 tablet 2 times a day.) 540 tablet 0   Cholecalciferol 50 MCG (2000 UT) TABS Take 1 tablet by mouth daily.     furosemide (LASIX) 40 MG tablet Take 1 tablet (40 mg total) by mouth daily. 90 tablet 0   Lancets MISC 1 application by Does not apply route daily. 100 each 11   omeprazole (PRILOSEC) 20 MG capsule Take 20 mg by  mouth daily.     potassium chloride (KLOR-CON) 10 MEQ tablet TAKE THREE TABLETS BY MOUTH AT BEDTIME (TAKE WITH FOOD)     senna (SENOKOT) 8.6 MG tablet Take 1 tablet by mouth 2 (two) times daily.     sertraline (ZOLOFT) 100 MG tablet Take 1 tablet by mouth daily.     Current Facility-Administered Medications on File Prior to Visit  Medication Dose Route Frequency Provider Last Rate Last Admin   incobotulinumtoxinA (XEOMIN) 100 units injection 600 Units  600 Units Intramuscular Q90 days Marcial Pacas, MD   600 Units at 12/18/21 1819    Allergies:  No Known Allergies  Physical Exam There were no vitals filed for this visit.  There is no height or weight on file to calculate BMI.   General: Obese very pleasant middle-aged African-American male, seated, in no evident distress Head: head normocephalic and atraumatic.   Neck: supple with no carotid or supraclavicular bruits Cardiovascular: regular rate and rhythm, no murmurs Musculoskeletal: no deformity Skin:  no rash/petichiae Vascular:  Normal pulses all extremities  Neurologic Exam Mental Status: Awake and fully alert. Oriented to place and time. Recent  memory subjectively impaired and remote memory intact. Attention span, concentration and fund of knowledge appropriate during visit. Mood and affect appropriate. Unable to complete further memory testing due to time constraints Cranial Nerves: Pupils equal, briskly reactive to light. Extraocular movements full without nystagmus. Visual fields full to confrontation. Hearing intact. Facial sensation intact.  Moderate right lower facial weakness., tongue, palate moves normally and symmetrically.  Motor: Normal strength and tone left upper and lower extremity RUE: 1/5 proximal, 0/5 distal, increased tone throughout RLE: 0/5 with increased tone throughout Sensory.: intact to touch , pinprick , position and vibratory sensation.  Coordination: Rapid alternating movements normal on left side. Finger-to-nose and heel-to-shin performed accurately on left side. Gait and Station: Deferred as nonambulatory reflexes: 3+ on the right. 1+ on left. Toes downgoing.         ASSESSMENT/PLAN: 66 year old African-American male with large left basal ganglia and small right basal ganglia and left pontine lacunar infarcts in November 2021 with significant residual spastic right hemiplegia.  Vascular risk factors of hypertension, hyperlipidemia, cardiomyopathy, congestive heart failure obesity and intracranial atherosclerosis     -Continue aspirin 81 mg daily and atorvastatin 80 mg daily for secondary stroke prevention -Continue working with PT - plans on establishing care with pain clinic through [place] for residual spasticity - advised sone to call office if further assistance is needed -Continue to follow with Dr. Krista Blue for Botox injections for spasticity -discussed doing memory exercises such as crossword puzzles, word search and sudoku for mild short-term memory loss following stroke.  Also discussed possible underlying sleep apnea potentially contributing. Will continue to monitor -[facility] for possible underlying sleep apnea -Loop recorder will continue to be monitored monthly by cardiology -Ensure close PCP follow-up for aggressive stroke risk factor management, maintain strict control of hypertension with blood pressure goal below 130/90 and lipids with LDL cholesterol goal below 70 mg/dL.  -Stroke labs 03/2021: LDL 78, A1c 6.3   Follow-up in 6 months or call earlier if needed    CC:  Biagio Borg, MD   I spent 38 minutes of face-to-face and non-face-to-face time with patient and son.  This included previsit chart review, lab review, study review, order entry, electronic health record documentation, patient education and discussion regarding history of prior stroke with residual deficits,  indication of further testing and referrals as ordered, secondary stroke prevention measures and aggressive stroke risk factor management and answered all other questions to patient satisfaction  Ihor Austin, [facility]  [facility] [address] [place], [place] [address]  Phone [phone] Fax [phone] Note: This document was prepared with digital dictation and possible smart phrase technology. Any transcriptional errors that result from this process are unintentional.

## 2022-01-12 ENCOUNTER — Ambulatory Visit (INDEPENDENT_AMBULATORY_CARE_PROVIDER_SITE_OTHER): Payer: No Typology Code available for payment source | Admitting: Adult Health

## 2022-01-12 ENCOUNTER — Encounter: Payer: Self-pay | Admitting: Adult Health

## 2022-01-12 VITALS — BP 132/85 | HR 85

## 2022-01-12 DIAGNOSIS — I69319 Unspecified symptoms and signs involving cognitive functions following cerebral infarction: Secondary | ICD-10-CM

## 2022-01-12 DIAGNOSIS — I639 Cerebral infarction, unspecified: Secondary | ICD-10-CM | POA: Diagnosis not present

## 2022-01-12 DIAGNOSIS — G8111 Spastic hemiplegia affecting right dominant side: Secondary | ICD-10-CM | POA: Diagnosis not present

## 2022-01-12 NOTE — Patient Instructions (Signed)
No changes today  Will follow up with Dr. Rexene Alberts regarding botox treatment and consider of treatment of leg as well. Repeat botox is scheduled in November  Will follow up with our sleep department regarding sleep apnea testing  Important to do memory exercises routinely at home    Follow up in 6 months or call earlier if needed

## 2022-01-13 IMAGING — NM NM PULMONARY PERF PARTICULATE
8 series · 8 of 8 positions shown · non-contrast
Comparison: None.

CLINICAL DATA: PE suspected.  High probability.

EXAM:
NUCLEAR MEDICINE PERFUSION LUNG SCAN
TECHNIQUE: Perfusion images were obtained in multiple projections after
intravenous injection of radiopharmaceutical.
Ventilation scans intentionally deferred if perfusion scan and chest
x-ray adequate for interpretation during COVID 19 epidemic.
RADIOPHARMACEUTICALS:  4.3 mCi 6c-22m MAA IV

[Series 1: ant/post perf · 4.14mm/px · 1 of 1 slices shown (1 of 2)]
[im 1/1]
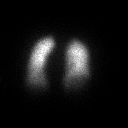

[Series 1: ant/post perf · 4.14mm/px · 1 of 1 slices shown (2 of 2)]
[im 1/1]
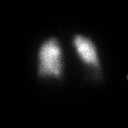

[Series 2: lao/rpo perf · 4.14mm/px · 1 of 1 slices shown (1 of 2)]
[im 1/1]
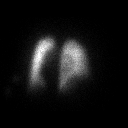

[Series 2: lao/rpo perf · 4.14mm/px · 1 of 1 slices shown (2 of 2)]
[im 1/1]
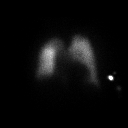

[Series 3: lpo/rao perf · 4.14mm/px · 1 of 1 slices shown (1 of 2)]
[im 1/1]
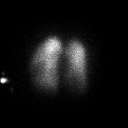

[Series 3: lpo/rao perf · 4.14mm/px · 1 of 1 slices shown (2 of 2)]
[im 1/1]
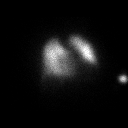

[Series 4: lt lat/rt lat perf · 4.14mm/px · 1 of 1 slices shown (1 of 2)]
[im 1/1]
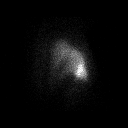

[Series 4: lt lat/rt lat perf · 4.14mm/px · 1 of 1 slices shown (2 of 2)]
[im 1/1]
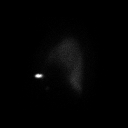

[8 of 8 positions shown; findings below may reference images not displayed]

FINDINGS: No significant filling defects are present. Normal perfusion is
present bilaterally.
IMPRESSION: Normal V/Q scan.  No pulmonary embolus.

## 2022-01-16 ENCOUNTER — Telehealth: Payer: Self-pay

## 2022-01-16 NOTE — Telephone Encounter (Signed)
Received message from [facility] on status of patient's sleep consult. Appears referral was marked unable to contact as an auth from the [place] for the consult had not been received. [facility] stated [facility] has [facility] consult - reached out to neuro referrals to see if they know of an auth that has come in, will contact patient if they do not have the [facility]

## 2022-01-19 NOTE — Progress Notes (Signed)
Carelink Summary Report / Loop Recorder 

## 2022-02-02 LAB — CUP PACEART REMOTE DEVICE CHECK
Date Time Interrogation Session: 20231010231537
Implantable Pulse Generator Implant Date: 20211130

## 2022-02-06 ENCOUNTER — Ambulatory Visit (INDEPENDENT_AMBULATORY_CARE_PROVIDER_SITE_OTHER): Payer: No Typology Code available for payment source

## 2022-02-06 DIAGNOSIS — I639 Cerebral infarction, unspecified: Secondary | ICD-10-CM

## 2022-03-01 ENCOUNTER — Ambulatory Visit (INDEPENDENT_AMBULATORY_CARE_PROVIDER_SITE_OTHER): Payer: No Typology Code available for payment source | Admitting: Neurology

## 2022-03-01 ENCOUNTER — Encounter: Payer: Self-pay | Admitting: Neurology

## 2022-03-01 VITALS — BP 128/88 | HR 94 | Ht 71.0 in

## 2022-03-01 DIAGNOSIS — G8111 Spastic hemiplegia affecting right dominant side: Secondary | ICD-10-CM

## 2022-03-01 DIAGNOSIS — I639 Cerebral infarction, unspecified: Secondary | ICD-10-CM

## 2022-03-01 MED ORDER — ONABOTULINUMTOXINA 100 UNITS IJ SOLR
600.0000 [IU] | Freq: Once | INTRAMUSCULAR | Status: AC
  Administered 2022-03-01: 600 [IU] via INTRAMUSCULAR

## 2022-03-01 NOTE — Progress Notes (Signed)
Botox 100 units x 6 vials  [id number] [phone] Exp-06/2024 B/b

## 2022-03-01 NOTE — Progress Notes (Signed)
Chief Complaint  Patient presents with   Procedure    Room 14 600 units xeomin       ASSESSMENT AND PLAN  Trevor Anderson is a 66 y.o. 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,  Limited range of motion of right shoulder, elbow, painful on passive stretch,  First electrical stimulation guided xeomin injection for spastic right hemiparesis, focused on right upper extremity, was in August 2023.  Used xeomin 600 units  Right pectoralis major 100 units Right latissimus dorsi 100 units Right brachialis 50 units Right brachioradialis 100 unit Pronator teres 50 units Right palmaris longus 50 units Right flexor digitorum profundus 50 units Right flexor digitorum superficialis 50 units Right flexor ulnaris 50 units  DIAGNOSTIC DATA (LABS, IMAGING, TESTING) - I reviewed patient records, labs, notes, testing and imaging myself where available.   MEDICAL HISTORY: [name] is 66 year old male, accompanied by his son, referred by Shanda Bumps for evaluation of botulism toxin injection for spastic right hemiparesis  Chart reviewed, past medical history Hypertension Hyperlipidemia Diabetes Congestive heart failure, cardiomyopathy, Stroke  He suffered acute stroke in November 2021, personally reviewed MRI of the brain, left basal ganglion acute infarction, and left paramedian pontine lacunar infarction  CT angiogram showed no large vessel disease, moderate bilateral M2, left P2, ACA stenosis  Echocardiogram 40 to 50%  TEE small PFO, felt to be incidental  Lower extremity venous Doppler was negative, LDL was 173, A1c was 6.8, he had loop recorder placement  He now lives at home with his son, came in wheelchair today, spent most of the time in sitting position, wearing right AFO, need help transfer, putting on clothes, complains of right shoulder elbow finger pain with passive stretch, he does have caregiver 7 hours each day  Update December 07, 2021: Electrical stimulation guided xeomin injection for spastic right hemiparesis, we use xeomin 600 units daily, focus on right upper extremity  Update March 01, 2022, He responded well to his injection, son reported that he can stretch his right finger much better, no significant side effect, he is receiving home PT OT   PHYSICAL EXAM:   Vitals:   03/01/22 1540  BP: 128/88  Pulse: 94  Height: 5\' 11"  (1.803 m)   PHYSICAL EXAMNIATION:  Gen: NAD, conversant, well nourised, well groomed                     Cardiovascular: Regular rate rhythm, no peripheral edema, warm, nontender. Eyes: Conjunctivae clear without exudates or hemorrhage Neck: Supple, no carotid bruits. Pulmonary: Clear to auscultation bilaterally   NEUROLOGICAL EXAM:  MENTAL STATUS: Speech/cognition: Dysarthria, following commands, mild finger agnosia,  CRANIAL NERVES: CN II: Visual fields are full to confrontation. Pupils are round equal and briskly reactive to light. CN III, IV, VI: extraocular movement are normal. No ptosis. CN V: Facial sensation is intact to light touch CN VII: Right lower face weakness CN VIII: Hearing is normal to causal conversation. CN IX, X: Phonation is normal. CN XI: Head turning and shoulder shrug are intact  MOTOR: Spastic right hemiparesis, involving right upper and lower extremity, no antigravity movement, very painful on passive stretch of the right shoulder, elbow, able to straight out right hand, wearing a right AFO, mild fixed spasticity of right knee, tendency for right knee adduction, ankle plantarflexion   REFLEXES: Hyperreflexia of right side  SENSORY: No extinction  COORDINATION: There is no trunk or limb dysmetria noted.  GAIT/STANCE:  Deferred  REVIEW OF SYSTEMS:  Full 14 system review of systems performed and notable only for as above All other review of systems were negative.   ALLERGIES: No Known Allergies  HOME MEDICATIONS: Current Outpatient  Medications  Medication Sig Dispense Refill   amLODipine (NORVASC) 10 MG tablet Take 1 tablet by mouth daily.     aspirin 81 MG EC tablet Take 81 mg by mouth daily. Swallow whole.     atorvastatin (LIPITOR) 40 MG tablet TAKE ONE TABLET BY MOUTH AT BEDTIME FOR CHOLESTEROL FOR CHOLESTEROL     baclofen (LIORESAL) 10 MG tablet Take 10 mg by mouth 3 (three) times daily.     carvedilol (COREG) 25 MG tablet Take 0.5 tablets by mouth daily.     Cholecalciferol 50 MCG (2000 UT) TABS Take 1 tablet by mouth daily.     hydrALAZINE (APRESOLINE) 25 MG tablet Take 25 mg by mouth 3 (three) times daily.     Lancets MISC 1 application by Does not apply route daily. 100 each 11   omeprazole (PRILOSEC) 20 MG capsule Take 20 mg by mouth daily.     potassium chloride (KLOR-CON) 10 MEQ tablet Take 10 mEq by mouth 2 (two) times daily.     senna (SENOKOT) 8.6 MG tablet Take 1 tablet by mouth 2 (two) times daily.     sertraline (ZOLOFT) 100 MG tablet Take 1 tablet by mouth daily.     Current Facility-Administered Medications  Medication Dose Route Frequency Provider Last Rate Last Admin   incobotulinumtoxinA (XEOMIN) 100 units injection 600 Units  600 Units Intramuscular Q90 days Levert Feinstein, MD   600 Units at 12/18/21 1819    PAST MEDICAL HISTORY: 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   CONGESTIVE HEART FAILURE 11/12/2007   DIABETES MELLITUS, TYPE II 11/20/2008   HYPERLIPIDEMIA 11/12/2007   HYPERTENSION 11/12/2007   TACHYCARDIA 01/24/2010    PAST SURGICAL HISTORY: Past Surgical History:  Procedure Laterality Date   BUBBLE STUDY  03/16/2020   Procedure: BUBBLE STUDY;  Surgeon: Jodelle Red, MD;  Location: [facility] ENDOSCOPY;  Service: Cardiovascular;;   LOOP RECORDER INSERTION N/A 03/23/2020   Procedure: LOOP RECORDER INSERTION;  Surgeon: Hillis Range, MD;  Location: [facility] INVASIVE CV LAB;  Service: Cardiovascular;  Laterality: N/A;   TEE WITHOUT CARDIOVERSION N/A  03/16/2020   Procedure: TRANSESOPHAGEAL ECHOCARDIOGRAM (TEE);  Surgeon: Jodelle Red, MD;  Location: [facility] ENDOSCOPY;  Service: Cardiovascular;  Laterality: N/A;   TONSILLECTOMY  1970    FAMILY HISTORY: Family History  Problem Relation Age of Onset   Kidney disease Mother     SOCIAL HISTORY: Social History   Socioeconomic History   Marital status: Single    Spouse name: Not on file   Number of children: Not on file   Years of education: Not on file   Highest education level: Not on file  Occupational History   Not on file  Tobacco Use   Smoking status: Never   Smokeless tobacco: Never  Substance and Sexual Activity   Alcohol use: Yes    Comment: social   Drug use: No   Sexual activity: Not on file  Other Topics Concern   Not on file  Social History Narrative   Not on file   Social Determinants of Health   Financial Resource Strain: Not on file  Food Insecurity: Not on file  Transportation Needs: Not on file  Physical Activity: Not on file  Stress: Not on file  Social Connections:  Not on file  Intimate Partner Violence: Not on file      Levert Feinstein, M.D. Ph.D.  [facility] [address] [place], [place] [address] Ph: [phone] Fax: [phone]  CC:  [name], MD [address] [place],  [place] [address]  [name], MD

## 2022-03-02 NOTE — Progress Notes (Signed)
Carelink Summary Report / Loop Recorder 

## 2022-03-13 ENCOUNTER — Ambulatory Visit (INDEPENDENT_AMBULATORY_CARE_PROVIDER_SITE_OTHER): Payer: No Typology Code available for payment source

## 2022-03-13 DIAGNOSIS — I639 Cerebral infarction, unspecified: Secondary | ICD-10-CM

## 2022-03-14 LAB — CUP PACEART REMOTE DEVICE CHECK
Date Time Interrogation Session: 20231119231713
Implantable Pulse Generator Implant Date: 20211130

## 2022-03-23 ENCOUNTER — Telehealth: Payer: Self-pay

## 2022-03-23 NOTE — Telephone Encounter (Signed)
Carrie from [place] called to ask for the patient to be released to them. Pt has been released, future appointments canceled, and marked inactive in paceart.

## 2022-04-10 ENCOUNTER — Telehealth: Payer: Self-pay | Admitting: Neurology

## 2022-04-10 NOTE — Telephone Encounter (Signed)
Pt is scheduled for botox injection on 05/24/22 and will need a new PA before appointment. Current PA expires 05/15/22

## 2022-04-10 NOTE — Telephone Encounter (Signed)
Please obtain authorization for electrical stimulation guided 600 unit Xeomin injection. Dx code: G63.11

## 2022-04-20 NOTE — Progress Notes (Signed)
Carelink Summary Report / Loop Recorder 

## 2022-04-26 ENCOUNTER — Other Ambulatory Visit (HOSPITAL_COMMUNITY): Payer: Self-pay

## 2022-04-26 MED ORDER — INCOBOTULINUMTOXINA 100 UNITS IM SOLR
600.0000 [IU] | INTRAMUSCULAR | 3 refills | Status: DC
Start: 2022-04-26 — End: 2022-08-23

## 2022-04-26 NOTE — Addendum Note (Signed)
Addended by: Cristela Felt E on: 04/26/2022 03:37 PM   Modules accepted: Orders

## 2022-05-02 NOTE — Telephone Encounter (Signed)
[facility] called, requesting [facility] mailing address to mail Xeomin for pt's appt on 05/24/22.

## 2022-05-03 NOTE — Telephone Encounter (Signed)
[address] [place]) not carrying Xeomin will not be able to approve for patient. Request to change to Dysport. Please contact [phone] ext [phone].

## 2022-05-04 NOTE — Telephone Encounter (Signed)
Esther from the [place] called again. Please call back when available.

## 2022-05-08 NOTE — Telephone Encounter (Signed)
Ok to switch to Dysport, please ask for Dysport 500 units x 3

## 2022-05-09 MED ORDER — DYSPORT 500 UNITS IM SOLR: INTRAMUSCULAR | 2 refills | Status: DC

## 2022-05-09 NOTE — Telephone Encounter (Signed)
Rx for dysport sent to [facility].

## 2022-05-09 NOTE — Addendum Note (Signed)
Addended by: Verlin Grills on: 05/09/2022 09:27 AM   Modules accepted: Orders

## 2022-05-24 ENCOUNTER — Encounter: Payer: Self-pay | Admitting: Neurology

## 2022-05-24 ENCOUNTER — Ambulatory Visit (INDEPENDENT_AMBULATORY_CARE_PROVIDER_SITE_OTHER): Payer: No Typology Code available for payment source | Admitting: Neurology

## 2022-05-24 VITALS — BP 127/84 | HR 92 | Ht 71.0 in | Wt 237.0 lb

## 2022-05-24 DIAGNOSIS — G8111 Spastic hemiplegia affecting right dominant side: Secondary | ICD-10-CM | POA: Diagnosis not present

## 2022-05-24 MED ORDER — ABOBOTULINUMTOXINA 500 UNITS IM SOLR: 500.0000 [IU] | Freq: Once | INTRAMUSCULAR | Status: DC

## 2022-05-24 NOTE — Progress Notes (Signed)
Dysport 500 units x 3 vials [id number] [phone] Exp-12/23/2023 S/P

## 2022-05-24 NOTE — Telephone Encounter (Signed)
Any updates from the [place] about dysport? Pt has an injection scheduled for today at 4:00pm

## 2022-05-24 NOTE — Progress Notes (Signed)
Chief Complaint  Patient presents with   Procedure    Rm 14.       ASSESSMENT AND PLAN  Trevor Anderson is a 67 y.o. 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,  Limited range of motion of right shoulder, elbow, painful on passive stretch,  Electrical stimulation guided botulism toxin injection since August 2023, used  xeomin 600 units previously, per insurance requirement, changed to Dysport 500 units x 3 starting May 24, 2022  Used dysport 500 units ( into 2.5 cc of [place])x3=1500 units today  Right pectoralis major 1.0 cc Right latissimus dorsi 1.5 cc Right brachialis 2.5 cc  Pronator teres 1.0  units Right palmaris longus 0.5 units Right flexor digitorum profundus 0.5 cc Right flexor digitorum superficialis 0.5 c    DIAGNOSTIC DATA (LABS, IMAGING, TESTING) - I reviewed patient records, labs, notes, testing and imaging myself where available.   MEDICAL HISTORY: Trevor Anderson is 67 year old male, accompanied by his son, referred by Janett Billow for evaluation of botulism toxin injection for spastic right hemiparesis  Chart reviewed, past medical history Hypertension Hyperlipidemia Diabetes Congestive heart failure, cardiomyopathy, Stroke  He suffered acute stroke in November 2021, personally reviewed MRI of the brain, left basal ganglion acute infarction, and left paramedian pontine lacunar infarction  CT angiogram showed no large vessel disease, moderate bilateral M2, left P2, ACA stenosis  Echocardiogram 40 to 50%  TEE small PFO, felt to be incidental  Lower extremity venous Doppler was negative, LDL was 173, A1c was 6.8, he had loop recorder placement  He now lives at home with his son, came in wheelchair today, spent most of the time in sitting position, wearing right AFO, need help transfer, putting on clothes, complains of right shoulder elbow finger pain with passive stretch, he does have caregiver 7 hours each day  Update  December 07, 2021: Electrical stimulation guided xeomin injection for spastic right hemiparesis, we use xeomin 600 units daily, focus on right upper extremity  Update March 01, 2022, He responded well to his injection, son reported that he can stretch his right finger much better, no significant side effect, he is receiving home PT OT  UPDATE May 24 2022: He responded well to previous injection, right shoulder, elbow, and less painful, less spasm, per insurance requirement, changed to Dysport 500 units x 3 today,  PHYSICAL EXAM:   Vitals:   05/24/22 1608  Weight: 237 lb (107.5 kg)  Height: 5\' 11"  (1.803 m)     PHYSICAL EXAMNIATION:  Gen: NAD, conversant, well nourised, well groomed                     Cardiovascular: Regular rate rhythm, no peripheral edema, warm, nontender. Eyes: Conjunctivae clear without exudates or hemorrhage Neck: Supple, no carotid bruits. Pulmonary: Clear to auscultation bilaterally   NEUROLOGICAL EXAM:  MENTAL STATUS: Speech/cognition: Dysarthria, aphasia, following commands, mild finger agnosia,   CN VII: Right lower face weakness   MOTOR: Spastic right hemiparesis, involving right upper and lower extremity, no antigravity movement,  painful on passive stretch of the right shoulder, elbow, able to straight out right hand, mild fixed spasticity of right knee, tendency for right knee adduction, ankle plantarflexion    REVIEW OF SYSTEMS:  Full 14 system review of systems performed and notable only for as above All other review of systems were negative.   ALLERGIES: No Known Allergies  HOME MEDICATIONS: Current Outpatient Medications  Medication Sig Dispense Refill  AbobotulinumtoxinA (DYSPORT) 500 units SOLR injection Inject into the muscles every 3 months. 3 each 2   amLODipine (NORVASC) 10 MG tablet Take 1 tablet by mouth daily.     aspirin 81 MG EC tablet Take 81 mg by mouth daily. Swallow whole.     atorvastatin (LIPITOR) 40 MG tablet TAKE  ONE TABLET BY MOUTH AT BEDTIME FOR CHOLESTEROL FOR CHOLESTEROL     baclofen (LIORESAL) 10 MG tablet Take 10 mg by mouth 3 (three) times daily.     carvedilol (COREG) 25 MG tablet Take 0.5 tablets by mouth daily.     Cholecalciferol 50 MCG (2000 UT) TABS Take 1 tablet by mouth daily.     hydrALAZINE (APRESOLINE) 25 MG tablet Take 25 mg by mouth 3 (three) times daily.     incobotulinumtoxinA (XEOMIN) 100 units SOLR injection Inject 600 Units into the muscle every 3 (three) months. 6 each 3   Lancets MISC 1 application by Does not apply route daily. 100 each 11   omeprazole (PRILOSEC) 20 MG capsule Take 20 mg by mouth daily.     potassium chloride (KLOR-CON) 10 MEQ tablet Take 10 mEq by mouth 2 (two) times daily.     senna (SENOKOT) 8.6 MG tablet Take 1 tablet by mouth 2 (two) times daily.     sertraline (ZOLOFT) 100 MG tablet Take 1 tablet by mouth daily.     Current Facility-Administered Medications  Medication Dose Route Frequency Provider Last Rate Last Admin   incobotulinumtoxinA (XEOMIN) 100 units injection 600 Units  600 Units Intramuscular Q90 days Marcial Pacas, MD   600 Units at 12/18/21 1819    PAST MEDICAL HISTORY: 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   CONGESTIVE HEART FAILURE 11/12/2007   DIABETES MELLITUS, TYPE II 11/20/2008   HYPERLIPIDEMIA 11/12/2007   HYPERTENSION 11/12/2007   TACHYCARDIA 01/24/2010    PAST SURGICAL HISTORY: Past Surgical History:  Procedure Laterality Date   BUBBLE STUDY  03/16/2020   Procedure: BUBBLE STUDY;  Surgeon: Buford Dresser, MD;  Location: [facility];  Service: Cardiovascular;;   LOOP RECORDER INSERTION N/A 03/23/2020   Procedure: LOOP RECORDER INSERTION;  Surgeon: Thompson Grayer, MD;  Location: [facility];  Service: Cardiovascular;  Laterality: N/A;   TEE WITHOUT CARDIOVERSION N/A 03/16/2020   Procedure: TRANSESOPHAGEAL ECHOCARDIOGRAM (TEE);  Surgeon: Buford Dresser, MD;  Location:  [facility];  Service: Cardiovascular;  Laterality: N/A;   TONSILLECTOMY  1970    FAMILY HISTORY: Family History  Problem Relation Age of Onset   Kidney disease Mother     SOCIAL HISTORY: Social History   Socioeconomic History   Marital status: Single    Spouse name: Not on file   Number of children: Not on file   Years of education: Not on file   Highest education level: Not on file  Occupational History   Not on file  Tobacco Use   Smoking status: Never   Smokeless tobacco: Never  Substance and Sexual Activity   Alcohol use: Yes    Comment: social   Drug use: No   Sexual activity: Not on file  Other Topics Concern   Not on file  Social History Narrative   Not on file   Social Determinants of Health   Financial Resource Strain: Not on file  Food Insecurity: Not on file  Transportation Needs: Not on file  Physical Activity: Not on file  Stress: Not on file  Social Connections: Not on file  Intimate Partner  Violence: Not on file      Marcial Pacas, M.D. Ph.D.  [facility] [address] [place], [place] [address] Ph: [phone] Fax: [phone]  CC:  Biagio Borg, MD [place],  [place] [address]  Biagio Borg, MD

## 2022-07-13 ENCOUNTER — Ambulatory Visit: Payer: No Typology Code available for payment source | Admitting: Adult Health

## 2022-07-13 NOTE — Progress Notes (Deleted)
[facility] [address] [place]. [place] [address] [phone]       OFFICE FOLLOW UP NOTE  [name] Date of Birth:  [birth year] Medical Record Number:  [id number]    Primary neurologist: Dr. Leonie Anderson Reason for Referral: Stroke  No chief complaint on file.    HPI:   Update 07/17/2022 JM: Patient returns for 17-month stroke follow-up.  Stable from stroke standpoint without new stroke/TIA symptoms.  Right spastic hemiparesis stable.  Continue Botox injections by Dr. Krista Anderson, last injection 05/24/2022, next injection scheduled 5/1.  Compliant on aspirin and atorvastatin.  Blood pressure well-controlled.  Closely follows with PCP.  Last loop recorder report 02/2022 ***.       History provided for reference purposes only Update 01/12/2022 JM: Patient returns for 38-month stroke follow-up accompanied by his son.  Overall stable.  Denies new stroke/TIA symptoms.  Continued right spastic hemiparesis.  Recently received initial Botox injection by Dr. Krista Anderson noting some improvement already.  Has repeat injection scheduled in November.  Son questions if botox can also be completed in the leg, how many injections are typical and how long do patients usually receive injections.  Per son, the [place] is wanting to send patient to a rehab facility in [place] but does not want to interfere with injections.  He is not currently working with therapies but does continue to do exercises at home.  Remains nonambulatory, transfers via wheelchair, able to stand/pivot with assistance.  Continues to have mild cognitive impairment and some difficulty with comprehension, does not do any brain exercises.  Compliant on aspirin and atorvastatin.  Blood pressure well controlled.  Closely followed by PCP Dr. Jenny Anderson.  Loop recorder has not shown atrial fibrillation thus far.  Completed TCD and CUS which was largely unremarkable.  No further concerns at this time.  Update 07/11/2021 JM: 67 year old male who  returns for stroke follow-up accompanied by his son.  Previously seen 3 months ago by Dr. Leonie Anderson.  Reports continued significant right spastic hemiparesis.  He has since been discharged from OT as he maximized rehab potential and severe spasticity limiting overall progress, of note, prior OT session 3/7 reported some improvement with ADLs.  Continues to work with PT making some progress. Is able to stand/pivot to transfer. Is nonambulatory but has been taking a couple steps during PT sessions. Just received AFO brace last week. [facility] but apparently he was seen by pain clinic that did not do Botox injections, they are in the process of setting him up with a different pain clinic. Per son, therapy wants to try electrotherapy but unable to due to loop recorder.  Son questions having this removed as it has been implanted over 1 year without evidence of A fib. Son also notes some occasional short-term memory loss which has been stable since his stroke. He does not do any type of memory exercises.  Patient does admit to occasional insomnia, daytime fatigue and snoring. He has not previously underwent sleep study. Denies new stroke/TIA symptoms.  Compliant on aspirin and atorvastatin without side effects.  Blood pressure today 134/85.    Loop recorder has not shown atrial fibrillation thus far. Dr. Leonie Anderson requested completion of TCD and carotid Doppler studies but not yet completed.  No further concerns at this time.  Initial visit 04/07/2021 Dr. Leonie Anderson: Trevor Anderson is a 67 year old African-American male seen today for initial office consultation visit.  He is accompanied by his son.  History is obtained from them and review of referral notes and electronic medical records.  I have available imaging results and PACS but not actual films for personal review today.  Patient has past medical history of hypertension, hyperlipidemia, diabetes, congestive heart failure, cardiomyopathy and  stroke.  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  MRI scan report from that time shows a large 3.4 cm left basal ganglia as well as smaller right basal ganglia and left paramedian pontine lacunar infarcts.  CT angiogram showed no large vessel occlusion but showed moderate bilateral M2, left P2 and ACA stenosis.  2D echo showed ejection fraction of 45 to 50%.  TEE showed a small PFO which is felt to be incidental.  Lower extremity venous Dopplers were negative.  LDL cholesterol was elevated at 173 mg percent and hemoglobin A1c at 6.8.  Patient had a loop recorder inserted and so far paroxysmal A. fib has not been found.  Patient unfortunately has not done well and continues to have significant spastic right hemiplegia and unable to even bend his fingers or toes or any movement.  He is barely able to stand even with 1 person assist and is unable to walk.  He still currently doing outpatient physical and occupational therapy.  His speech and swallowing have improved and he can eat all right speaks clearly without dysarthria.  [facility] but he has not yet gotten appointment.  Patient is tolerating aspirin and Plavix with minor bruising and no bleeding.  His blood pressure is under good control today it is 137/88.  He is tolerating Lipitor well without muscle aches and pains.  He has not had any follow-up lipid profile checked recently or vascular studies done for follow-up.  He has no new complaints.     ROS:   14 system review of systems is positive for those listed in HPI and all other systems negative  PMH:  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   CONGESTIVE HEART FAILURE 11/12/2007   DIABETES MELLITUS, TYPE II 11/20/2008   HYPERLIPIDEMIA 11/12/2007   HYPERTENSION 11/12/2007   TACHYCARDIA 01/24/2010    Social History:  Social History   Socioeconomic  History   Marital status: Single    Spouse name: Not on file   Number of children: Not on file   Years of education: Not on file   Highest education level: Not on file  Occupational History   Not on file  Tobacco Use   Smoking status: Never   Smokeless tobacco: Never  Substance and Sexual Activity   Alcohol use: Yes    Comment: social   Drug use: No   Sexual activity: Not on file  Other Topics Concern   Not on file  Social History Narrative   Not on file   Social Determinants of Health   Financial Resource Strain: Not on file  Food Insecurity: Not on file  Transportation Needs: Not on file  Physical Activity: Not on file  Stress: Not on file  Social Connections: Not on file  Intimate Partner Violence: Not on file    Medications:   Current Outpatient Medications on File Prior to Visit  Medication Sig Dispense Refill   AbobotulinumtoxinA (DYSPORT) 500 units SOLR injection Inject into the muscles every 3 months. 3 each 2   amLODipine (NORVASC) 10 MG tablet Take 1 tablet by mouth daily.  aspirin 81 MG EC tablet Take 81 mg by mouth daily. Swallow whole.     atorvastatin (LIPITOR) 40 MG tablet TAKE ONE TABLET BY MOUTH AT BEDTIME FOR CHOLESTEROL FOR CHOLESTEROL     baclofen (LIORESAL) 10 MG tablet Take 10 mg by mouth 3 (three) times daily.     carvedilol (COREG) 25 MG tablet Take 0.5 tablets by mouth daily.     Cholecalciferol 50 MCG (2000 UT) TABS Take 1 tablet by mouth daily.     hydrALAZINE (APRESOLINE) 25 MG tablet Take 25 mg by mouth 3 (three) times daily.     incobotulinumtoxinA (XEOMIN) 100 units SOLR injection Inject 600 Units into the muscle every 3 (three) months. 6 each 3   Lancets MISC 1 application by Does not apply route daily. 100 each 11   omeprazole (PRILOSEC) 20 MG capsule Take 20 mg by mouth daily.     potassium chloride (KLOR-CON) 10 MEQ tablet Take 10 mEq by mouth 2 (two) times daily.     senna (SENOKOT) 8.6 MG tablet Take 1 tablet by mouth 2 (two)  times daily.     sertraline (ZOLOFT) 100 MG tablet Take 1 tablet by mouth daily.     Current Facility-Administered Medications on File Prior to Visit  Medication Dose Route Frequency Provider Last Rate Last Admin   AbobotulinumtoxinA (DYSPORT) 500 units injection 500 Units  500 Units Intramuscular Once Marcial Pacas, MD        Allergies:  No Known Allergies  Physical Exam There were no vitals filed for this visit.   There is no height or weight on file to calculate BMI.   General: Obese very pleasant middle-aged African-American male, seated, in no evident distress Head: head normocephalic and atraumatic.   Neck: supple with no carotid or supraclavicular bruits Cardiovascular: regular rate and rhythm, no murmurs Musculoskeletal: no deformity Skin:  no rash/petichiae Vascular:  Normal pulses all extremities  Neurologic Exam Mental Status: Awake and fully alert. Oriented to place and time. Recent memory subjectively impaired and remote memory intact. Attention span, concentration and fund of knowledge appropriate during visit.  Slightly impaired comprehension at times.  Mood and affect appropriate.  Cranial Nerves: Pupils equal, briskly reactive to light. Extraocular movements full without nystagmus. Visual fields full to confrontation. Hearing intact. Facial sensation intact.  Moderate right lower facial weakness., tongue, palate moves normally and symmetrically.  Motor: Normal strength and tone left upper and lower extremity RUE: 1/5 throughout, able to slightly grip hand but unable to actively extend fingers, increased tone throughout RLE: 2/5 HF otherwise 0/5 with increased tone throughout Sensory.: intact to touch , pinprick , position and vibratory sensation.  Coordination: Rapid alternating movements normal on left side. Finger-to-nose and heel-to-shin performed accurately on left side. Gait and Station: Deferred as nonambulatory reflexes: 3+ on the right. 1+ on left. Toes  downgoing.         ASSESSMENT/PLAN: 67 year old African-American male with large left basal ganglia and small right basal ganglia and left pontine lacunar infarcts in November 2021 with significant residual spastic right hemiplegia.  Vascular risk factors of hypertension, hyperlipidemia, cardiomyopathy, congestive heart failure obesity and intracranial atherosclerosis     -Continue aspirin 81 mg daily and atorvastatin 80 mg daily for secondary stroke prevention -Continue to follow with Dr. Krista Anderson for Botox injections for spasticity -advised to discuss additional therapy after next botox injection with Dr. Krista Anderson -will send message to Dr. Krista Anderson re: questions as noted in HPI -discussed importance of doing memory exercises such  as crossword puzzles, word search and sudoku for mild short-term memory loss following stroke.   -referred to [place] sleep clinic at prior visit, will reach out to sleep clinic regarding scheduling initial visit -Loop recorder will continue to be monitored monthly by cardiology -Ensure close PCP follow-up for aggressive stroke risk factor management, maintain strict control of hypertension with blood pressure goal below 130/90 and lipids with LDL cholesterol goal below 70 mg/dL.     Follow-up in 6 months or call earlier if needed    CC:  Biagio Borg, MD   I spent 36 minutes of face-to-face and non-face-to-face time with patient and son.  This included previsit chart review, lab review, study review, electronic health record documentation, patient and son education and discussion regarding history of prior stroke with residual deficits, secondary stroke prevention measures and aggressive stroke risk factor management and answered all other questions to patient and son satisfaction  Frann Rider, AGNP-BC  [facility] [address] [place], [place] [address]  Phone [phone] Fax [phone] Note: This document was prepared with  digital dictation and possible smart phrase technology. Any transcriptional errors that result from this process are unintentional.

## 2022-07-17 ENCOUNTER — Ambulatory Visit: Payer: No Typology Code available for payment source | Admitting: Adult Health

## 2022-07-17 ENCOUNTER — Encounter: Payer: Self-pay | Admitting: Adult Health

## 2022-07-18 ENCOUNTER — Encounter: Payer: Self-pay | Admitting: Adult Health

## 2022-07-18 ENCOUNTER — Ambulatory Visit (INDEPENDENT_AMBULATORY_CARE_PROVIDER_SITE_OTHER): Payer: No Typology Code available for payment source | Admitting: Adult Health

## 2022-07-18 VITALS — BP 123/77 | HR 97

## 2022-07-18 DIAGNOSIS — I639 Cerebral infarction, unspecified: Secondary | ICD-10-CM | POA: Diagnosis not present

## 2022-07-18 DIAGNOSIS — G8111 Spastic hemiplegia affecting right dominant side: Secondary | ICD-10-CM | POA: Diagnosis not present

## 2022-07-18 NOTE — Patient Instructions (Signed)
Continue Eliquis 5mg  twice daily and atorvastatin for secondary stroke prevention  Would follow up with cardiology regarding ongoing need of aspirin as this is not needed from a stroke standpoint   Continue to follow with Dr. Krista Blue for continued Dysport injections   Continue to follow up with PCP regarding blood pressure and cholesterol management  Maintain strict control of hypertension with blood pressure goal below 130/90 and cholesterol with LDL cholesterol (bad cholesterol) goal below 70 mg/dL.   Signs of a Stroke? Follow the BEFAST method:  Balance Watch for a sudden loss of balance, trouble with coordination or vertigo Eyes Is there a sudden loss of vision in one or both eyes? Or double vision?  Face: Ask the person to smile. Does one side of the face droop or is it numb?  Arms: Ask the person to raise both arms. Does one arm drift downward? Is there weakness or numbness of a leg? Speech: Ask the person to repeat a simple phrase. Does the speech sound slurred/strange? Is the person confused ? Time: If you observe any of these signs, call 911.       Thank you for coming to see [place] at [facility]. I hope we have been able to provide you high quality care today.  You may receive a patient satisfaction survey over the next few weeks. We would appreciate your feedback and comments so that we may continue to improve ourselves and the health of our patients.

## 2022-07-18 NOTE — Progress Notes (Signed)
[facility] [address] [place]. [place] [address] [phone]       OFFICE FOLLOW UP NOTE  [name] Date of Birth:  06-08-1955 Medical Record Number:  [id number]    Primary neurologist: Dr. Leonie Anderson Reason for Referral: Stroke  Chief Complaint  Patient presents with   Follow-up    Patient in room #3 with his son. Patient states he is well and stable, no new concerns.     HPI:   Update 07/17/2022 JM: Patient returns for 31-month stroke follow-up accompanied by his son.  Stable from stroke standpoint without new stroke/TIA symptoms.  Right spastic hemiparesis stable.  Continued Dysport injections by Dr. Krista Anderson, last injection 05/24/2022, next injection scheduled 5/1, reports continued benefit.  Remains nonambulatory, transfers via wheelchair, will stand/pivot with assistance.  [facility], per son about 7 weeks ago, ILR showed evidence of A-fib and was started on Eliquis 5 mg twice daily.  He has continued on Eliquis as well as aspirin atorvastatin without side effects.  Routinely followed by [facility] cardiology. Blood pressure well-controlled.  Routinely followed by [facility].      History provided for reference purposes only Update 01/12/2022 JM: Patient returns for 66-month stroke follow-up accompanied by his son.  Overall stable.  Denies new stroke/TIA symptoms.  Continued right spastic hemiparesis.  Recently received initial Botox injection by Dr. Krista Anderson noting some improvement already.  Has repeat injection scheduled in November.  Son questions if botox can also be completed in the leg, how many injections are typical and how long do patients usually receive injections.  Per son, the [place] is wanting to send patient to a rehab facility in [place] but does not want to interfere with injections.  He is not currently working with therapies but does continue to do exercises at home.  Remains nonambulatory, transfers via wheelchair, able to stand/pivot  with assistance.  Continues to have mild cognitive impairment and some difficulty with comprehension, does not do any brain exercises.  Compliant on aspirin and atorvastatin.  Blood pressure well controlled.  Closely followed by PCP Dr. Jenny Anderson.  Loop recorder has not shown atrial fibrillation thus far.  Completed TCD and CUS which was largely unremarkable.  No further concerns at this time.  Update 07/11/2021 JM: 67 year old male who returns for stroke follow-up accompanied by his son.  Previously seen 3 months ago by Dr. Leonie Anderson.  Reports continued significant right spastic hemiparesis.  He has since been discharged from OT as he maximized rehab potential and severe spasticity limiting overall progress, of note, prior OT session 3/7 reported some improvement with ADLs.  Continues to work with PT making some progress. Is able to stand/pivot to transfer. Is nonambulatory but has been taking a couple steps during PT sessions. Just received AFO brace last week. [facility] but apparently he was seen by pain clinic that did not do Botox injections, they are in the process of setting him up with a different pain clinic. Per son, therapy wants to try electrotherapy but unable to due to loop recorder.  Son questions having this removed as it has been implanted over 1 year without evidence of A fib. Son also notes some occasional short-term memory loss which has been stable since his stroke. He does not do any type of memory exercises.  Patient does admit to occasional insomnia, daytime fatigue and snoring. He has not previously underwent sleep study. Denies new stroke/TIA  symptoms.  Compliant on aspirin and atorvastatin without side effects.  Blood pressure today 134/85.    Loop recorder has not shown atrial fibrillation thus far. Dr. Leonie Anderson requested completion of TCD and carotid Doppler studies but not yet completed.  No further concerns at this time.  Initial visit 04/07/2021 Dr. Leonie Anderson:  [name] is a 67 year old African-American male seen today for initial office consultation visit.  He is accompanied by his son.  History is obtained from them and review of referral notes and electronic medical records.  I have available imaging results and PACS but not actual films for personal review today.  Patient has past medical history of hypertension, hyperlipidemia, diabetes, congestive heart failure, cardiomyopathy and stroke.  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  MRI scan report from that time shows a large 3.4 cm left basal ganglia as well as smaller right basal ganglia and left paramedian pontine lacunar infarcts.  CT angiogram showed no large vessel occlusion but showed moderate bilateral M2, left P2 and ACA stenosis.  2D echo showed ejection fraction of 45 to 50%.  TEE showed a small PFO which is felt to be incidental.  Lower extremity venous Dopplers were negative.  LDL cholesterol was elevated at 173 mg percent and hemoglobin A1c at 6.8.  Patient had a loop recorder inserted and so far paroxysmal A. fib has not been found.  Patient unfortunately has not done well and continues to have significant spastic right hemiplegia and unable to even bend his fingers or toes or any movement.  He is barely able to stand even with 1 person assist and is unable to walk.  He still currently doing outpatient physical and occupational therapy.  His speech and swallowing have improved and he can eat all right speaks clearly without dysarthria.  [facility] clinic but he has not yet gotten appointment.  Patient is tolerating aspirin and Plavix with minor bruising and no bleeding.  His blood pressure is under good control today it is 137/88.  He is tolerating Lipitor well without muscle aches and pains.  He has not had any follow-up lipid profile checked recently or vascular studies done for follow-up.  He has  no new complaints.     ROS:   14 system review of systems is positive for those listed in HPI and all other systems negative  PMH:  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   CONGESTIVE HEART FAILURE 11/12/2007   DIABETES MELLITUS, TYPE II 11/20/2008   HYPERLIPIDEMIA 11/12/2007   HYPERTENSION 11/12/2007   TACHYCARDIA 01/24/2010    Social History:  Social History   Socioeconomic History   Marital status: Single    Spouse name: Not on file   Number of children: Not on file   Years of education: Not on file   Highest education level: Not on file  Occupational History   Not on file  Tobacco Use   Smoking status: Never   Smokeless tobacco: Never  Substance and Sexual Activity   Alcohol use: Yes    Comment: social   Drug use: No   Sexual activity: Not on file  Other Topics Concern   Not on file  Social History Narrative   Not on file   Social Determinants of Health   Financial Resource Strain: Not on file  Food Insecurity: Not on file  Transportation Needs: Not on file  Physical  Activity: Not on file  Stress: Not on file  Social Connections: Not on file  Intimate Partner Violence: Not on file    Medications:   Current Outpatient Medications on File Prior to Visit  Medication Sig Dispense Refill   AbobotulinumtoxinA (DYSPORT) 500 units SOLR injection Inject into the muscles every 3 months. 3 each 2   amLODipine (NORVASC) 10 MG tablet Take 1 tablet by mouth daily.     apixaban (ELIQUIS) 5 MG TABS tablet Take 5 mg by mouth 2 (two) times daily.     atorvastatin (LIPITOR) 40 MG tablet TAKE ONE TABLET BY MOUTH AT BEDTIME FOR CHOLESTEROL FOR CHOLESTEROL     baclofen (LIORESAL) 10 MG tablet Take 10 mg by mouth 3 (three) times daily.     carvedilol (COREG) 25 MG tablet Take 0.5 tablets by mouth daily.     Cholecalciferol 50 MCG (2000 UT) TABS Take 1 tablet by mouth daily.     hydrALAZINE (APRESOLINE) 25 MG tablet Take 25 mg by mouth 3  (three) times daily.     incobotulinumtoxinA (XEOMIN) 100 units SOLR injection Inject 600 Units into the muscle every 3 (three) months. 6 each 3   Lancets MISC 1 application by Does not apply route daily. 100 each 11   omeprazole (PRILOSEC) 20 MG capsule Take 20 mg by mouth daily.     potassium chloride (KLOR-CON) 10 MEQ tablet Take 10 mEq by mouth 2 (two) times daily.     senna (SENOKOT) 8.6 MG tablet Take 1 tablet by mouth 2 (two) times daily.     sertraline (ZOLOFT) 100 MG tablet Take 1 tablet by mouth daily.     Current Facility-Administered Medications on File Prior to Visit  Medication Dose Route Frequency Provider Last Rate Last Admin   AbobotulinumtoxinA (DYSPORT) 500 units injection 500 Units  500 Units Intramuscular Once Marcial Pacas, MD        Allergies:  No Known Allergies  Physical Exam Today's Vitals   07/18/22 1405  BP: 123/77  Pulse: 97   There is no height or weight on file to calculate BMI.   General: Very pleasant middle-aged African-American male, seated, in no evident distress Head: head normocephalic and atraumatic.   Neck: supple with no carotid or supraclavicular bruits Cardiovascular: regular rate and rhythm, no murmurs Musculoskeletal: no deformity Skin:  no rash/petichiae Vascular:  Normal pulses all extremities  Neurologic Exam Mental Status: Awake and fully alert. Oriented to place and time. Recent memory subjectively impaired and remote memory intact. Attention span, concentration and fund of knowledge appropriate during visit.  Slightly impaired comprehension at times.  Mood and affect appropriate.  Cranial Nerves: Pupils equal, briskly reactive to light. Extraocular movements full without nystagmus. Visual fields full to confrontation. Hearing intact. Facial sensation intact.  Moderate right lower facial weakness., tongue, palate moves normally and symmetrically.  Motor: Normal strength and tone left upper and lower extremity RUE: 1/5 throughout,  increased tone throughout RLE: 2/5 HF otherwise 0/5 with increased tone throughout, AFO in place Sensory.: intact to touch , pinprick , position and vibratory sensation.  Coordination: Rapid alternating movements normal on left side. Finger-to-nose and heel-to-shin performed accurately on left side. Gait and Station: Deferred as nonambulatory reflexes: 3+ on the right. 1+ on left. Toes downgoing.         ASSESSMENT/PLAN: 67 year old African-American male with large left basal ganglia and small right basal ganglia and left pontine lacunar infarcts in November 2021 with significant residual spastic right hemiplegia.  Vascular  risk factors of hypertension, hyperlipidemia, cardiomyopathy, congestive heart failure obesity and intracranial atherosclerosis and recent diagnosis of A-fib now on Eliquis     -Continue Eliquis 5 mg twice daily and atorvastatin 80 mg daily for secondary stroke prevention -No indication for continued use of aspirin from stroke standpoint but advised to further discuss with cardiology for ongoing need from cardiology standpoint -Continue to follow with Dr. Krista Anderson for Botox injections for spasticity -[facility] to [facility] -Continue aggressive stroke risk factor management, maintain strict control of hypertension with blood pressure goal below 130/90 and lipids with LDL cholesterol goal below 70 mg/dL.     Overall stable from stroke standpoint without further recommendations and risk factors are managed by PCP/[facility]. He may follow up PRN, as usual for our patients who are strictly being followed for stroke. If any new neurological issues should arise, request PCP place referral for evaluation by one of our neurologists. Thank you.      CC:  [facility]   I spent 31 minutes of face-to-face and non-face-to-face time with patient and son.  This included previsit chart review, lab review, study review, electronic health record  documentation, patient and son education and discussion regarding history of prior stroke with residual deficits, secondary stroke prevention measures and aggressive stroke risk factor management and answered all other questions to patient and son satisfaction  Frann Rider, AGNP-BC  [facility] [address] [place], [place] [address]  Phone [phone] Fax [phone] Note: This document was prepared with digital dictation and possible smart phrase technology. Any transcriptional errors that result from this process are unintentional.

## 2022-08-03 NOTE — Telephone Encounter (Signed)
Would you be able to reach out to the [place] and check on this auth? It's documented that we received an approval but there's no other information on it. His appt is 08/23/22, thank you.

## 2022-08-08 NOTE — Telephone Encounter (Signed)
I outreached the [facility] pharmacy department at [phone] ext [phone]. I verified that there is a lifetime approval on the medication itself-However the Neurology Consult is active for 180 days-meaning that the PT must make his 08/23/2022 appointment to have his Dysport or the entire PA process will need to be repeated to be covered for the Neurologist visit. If PT makes it to his 08/23/2022 appointment then the new Consult expiration will be 02/19/2023 and as long as PT keeps his appointments it will continue to renew automatically for the visits itself.  PA approval on file.  Someone from the [facility] office will need to call them to verify if they need to ship the medication out.  Original approval # [id number]

## 2022-08-15 NOTE — Telephone Encounter (Signed)
Called and spoke with [facility] pharmacy department [phone] ext [phone]. Rep stated that they could send Dysport to our office but would have to send [place] paperwork to fill out and it's not guaranteed it would make it here before 5/1 appt. She stated that buy/bill would also be covered. Will use buy/bill for pt's upcoming appt.

## 2022-08-23 ENCOUNTER — Ambulatory Visit (INDEPENDENT_AMBULATORY_CARE_PROVIDER_SITE_OTHER): Payer: No Typology Code available for payment source | Admitting: Neurology

## 2022-08-23 VITALS — BP 119/78

## 2022-08-23 DIAGNOSIS — G8111 Spastic hemiplegia affecting right dominant side: Secondary | ICD-10-CM | POA: Diagnosis not present

## 2022-08-23 MED ORDER — ABOBOTULINUMTOXINA 500 UNITS IM SOLR
1500.0000 [IU] | Freq: Once | INTRAMUSCULAR | Status: AC
  Administered 2022-08-23: 1500 [IU] via INTRAMUSCULAR

## 2022-08-23 NOTE — Progress Notes (Signed)
Dysport 500 units x 3 vial  [id number] [id number] Exp-01/22/2024 B/B Bacteriostatic 0.9% Sodium Chloride- 7.5 mL  Lot: [id number] Expiration: 11/25 NDC: 91478-295-62 Dx: G81.11 WITNESSED BY Athena Masse RN

## 2022-08-23 NOTE — Progress Notes (Signed)
Chief Complaint  Patient presents with   Procedure    Rm 17 pt is well and prepared for dysport injection, Son present (Fay)      ASSESSMENT AND PLAN  Trevor Anderson is a 68 y.o. 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,  Limited range of motion of right shoulder, elbow, painful on passive stretch,  Electrical stimulation guided botulism toxin injection since August 2023, used  xeomin 600 units previously, per insurance requirement, changed to Dysport 500 units x 3 starting May 24, 2022  Used dysport 500 units ( into 2.5 cc of [place])x3=1500 units today  Right pectoralis major 2.5 cc Right latissimus dorsi 1.5 cc Right brachialis 1.0 cc  Pronator teres 1.0  units Right palmaris longus 0.5 units Right flexor digitorum profundus 0.5 cc Right flexor digitorum superficialis 0.5 c    DIAGNOSTIC DATA (LABS, IMAGING, TESTING) - I reviewed patient records, labs, notes, testing and imaging myself where available.   MEDICAL HISTORY: [name] is 67 year old male, accompanied by his son, referred by Shanda Bumps for evaluation of botulism toxin injection for spastic right hemiparesis  Chart reviewed, past medical history Hypertension Hyperlipidemia Diabetes Congestive heart failure, cardiomyopathy, Stroke  He suffered acute stroke in November 2021, personally reviewed MRI of the brain, left basal ganglion acute infarction, and left paramedian pontine lacunar infarction  CT angiogram showed no large vessel disease, moderate bilateral M2, left P2, ACA stenosis  Echocardiogram 40 to 50%  TEE small PFO, felt to be incidental  Lower extremity venous Doppler was negative, LDL was 173, A1c was 6.8, he had loop recorder placement  He now lives at home with his son, came in wheelchair today, spent most of the time in sitting position, wearing right AFO, need help transfer, putting on clothes, complains of right shoulder elbow finger pain with  passive stretch, he does have caregiver 7 hours each day  Update December 07, 2021: Electrical stimulation guided xeomin injection for spastic right hemiparesis, we use xeomin 600 units daily, focus on right upper extremity  Update March 01, 2022, He responded well to his injection, son reported that he can stretch his right finger much better, no significant side effect, he is receiving home PT OT  UPDATE May 24 2022: He responded well to previous injection, right shoulder, elbow, and less painful, less spasm, per insurance requirement, changed to Dysport 500 units x 3 today,  UPDATE Aug 23 2022: Previous injection did help his right shoulder pain, no side effect, wear right AFO, take few steps,  PHYSICAL EXAM:       PHYSICAL EXAMNIATION:  Gen: NAD, conversant, well nourised, well groomed                     Cardiovascular: Regular rate rhythm, no peripheral edema, warm, nontender. Eyes: Conjunctivae clear without exudates or hemorrhage Neck: Supple, no carotid bruits. Pulmonary: Clear to auscultation bilaterally   NEUROLOGICAL EXAM:  MENTAL STATUS: Speech/cognition: Dysarthria, aphasia, following commands, mild finger agnosia,   CN VII: Right lower face weakness   MOTOR: Spastic right hemiparesis, involving right upper and lower extremity, no antigravity movement,  painful on passive stretch of the right shoulder, elbow, able to straight out right hand, mild fixed spasticity of right knee, tendency for right knee adduction, ankle plantarflexion, wear right AFO    REVIEW OF SYSTEMS:  Full 14 system review of systems performed and notable only for as above All other review of systems were  negative.   ALLERGIES: No Known Allergies  HOME MEDICATIONS: Current Outpatient Medications  Medication Sig Dispense Refill   AbobotulinumtoxinA (DYSPORT) 500 units SOLR injection Inject into the muscles every 3 months. 3 each 2   amLODipine (NORVASC) 10 MG tablet Take 1 tablet by  mouth daily.     apixaban (ELIQUIS) 5 MG TABS tablet Take 5 mg by mouth 2 (two) times daily.     atorvastatin (LIPITOR) 40 MG tablet TAKE ONE TABLET BY MOUTH AT BEDTIME FOR CHOLESTEROL FOR CHOLESTEROL     baclofen (LIORESAL) 10 MG tablet Take 10 mg by mouth 3 (three) times daily.     carvedilol (COREG) 25 MG tablet Take 0.5 tablets by mouth daily.     Cholecalciferol 50 MCG (2000 UT) TABS Take 1 tablet by mouth daily.     hydrALAZINE (APRESOLINE) 25 MG tablet Take 25 mg by mouth 3 (three) times daily.     incobotulinumtoxinA (XEOMIN) 100 units SOLR injection Inject 600 Units into the muscle every 3 (three) months. 6 each 3   Lancets MISC 1 application by Does not apply route daily. 100 each 11   omeprazole (PRILOSEC) 20 MG capsule Take 20 mg by mouth daily.     potassium chloride (KLOR-CON) 10 MEQ tablet Take 10 mEq by mouth 2 (two) times daily.     senna (SENOKOT) 8.6 MG tablet Take 1 tablet by mouth 2 (two) times daily.     sertraline (ZOLOFT) 100 MG tablet Take 1 tablet by mouth daily.     Current Facility-Administered Medications  Medication Dose Route Frequency Provider Last Rate Last Admin   AbobotulinumtoxinA (DYSPORT) 500 units injection 1,500 Units  1,500 Units Intramuscular Once Levert Feinstein, MD       AbobotulinumtoxinA (DYSPORT) 500 units injection 500 Units  500 Units Intramuscular Once Levert Feinstein, MD        PAST MEDICAL HISTORY: 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   CONGESTIVE HEART FAILURE 11/12/2007   DIABETES MELLITUS, TYPE II 11/20/2008   HYPERLIPIDEMIA 11/12/2007   HYPERTENSION 11/12/2007   TACHYCARDIA 01/24/2010    PAST SURGICAL HISTORY: Past Surgical History:  Procedure Laterality Date   BUBBLE STUDY  03/16/2020   Procedure: BUBBLE STUDY;  Surgeon: Jodelle Red, MD;  Location: [facility] ENDOSCOPY;  Service: Cardiovascular;;   LOOP RECORDER INSERTION N/A 03/23/2020   Procedure: LOOP RECORDER INSERTION;  Surgeon: Hillis Range, MD;  Location: [facility] INVASIVE CV LAB;  Service: Cardiovascular;  Laterality: N/A;   TEE WITHOUT CARDIOVERSION N/A 03/16/2020   Procedure: TRANSESOPHAGEAL ECHOCARDIOGRAM (TEE);  Surgeon: Jodelle Red, MD;  Location: [facility] ENDOSCOPY;  Service: Cardiovascular;  Laterality: N/A;   TONSILLECTOMY  1970    FAMILY HISTORY: Family History  Problem Relation Age of Onset   Kidney disease Mother     SOCIAL HISTORY: Social History   Socioeconomic History   Marital status: Single    Spouse name: Not on file   Number of children: Not on file   Years of education: Not on file   Highest education level: Not on file  Occupational History   Not on file  Tobacco Use   Smoking status: Never   Smokeless tobacco: Never  Substance and Sexual Activity   Alcohol use: Yes    Comment: social   Drug use: No   Sexual activity: Not on file  Other Topics Concern   Not on file  Social History Narrative   Not on file   Social Determinants of Health  Financial Resource Strain: Not on file  Food Insecurity: Not on file  Transportation Needs: Not on file  Physical Activity: Not on file  Stress: Not on file  Social Connections: Not on file  Intimate Partner Violence: Not on file      Levert Feinstein, M.D. Ph.D.  [facility] [address] [place], [place] [address] Ph: [phone] Fax: [phone]  CC:  [name], MD [address] [place],  [place] [address]  Patient, No Pcp Per

## 2022-11-23 ENCOUNTER — Ambulatory Visit: Payer: No Typology Code available for payment source | Admitting: Neurology

## 2022-11-23 VITALS — BP 125/80

## 2022-11-23 DIAGNOSIS — G8111 Spastic hemiplegia affecting right dominant side: Secondary | ICD-10-CM

## 2022-11-23 MED ORDER — ABOBOTULINUMTOXINA 500 UNITS IM SOLR
500.0000 [IU] | Freq: Once | INTRAMUSCULAR | Status: AC
  Administered 2022-11-27: 500 [IU] via INTRAMUSCULAR

## 2022-11-23 NOTE — Progress Notes (Signed)
Chief Complaint  Patient presents with   Procedure    Rm17, son present  Pt is well and ready for injection      ASSESSMENT AND PLAN  Trevor Anderson is a 67 y.o. 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,  Limited range of motion of right shoulder, elbow, painful on passive stretch,  Electrical stimulation guided botulism toxin injection since August 2023, used  xeomin 600 units previously, per insurance requirement, changed to Dysport 500 units x 3 starting May 24, 2022  Used dysport 500 units ( into 2.5 cc of [place])x3=1500 units today  Right pectoralis major 1.5 cc Right latissimus dorsi 1.5 cc Right brachialis 2.0 cc  Pronator teres 1.0  units Right palmaris longus 0.5 units Right flexor digitorum profundus 0.5 cc Right flexor digitorum superficialis 0.5 c    DIAGNOSTIC DATA (LABS, IMAGING, TESTING) - I reviewed patient records, labs, notes, testing and imaging myself where available.   MEDICAL HISTORY: [name] is 67 year old male, accompanied by his son, referred by Shanda Bumps for evaluation of botulism toxin injection for spastic right hemiparesis  Chart reviewed, past medical history Hypertension Hyperlipidemia Diabetes Congestive heart failure, cardiomyopathy, Stroke  He suffered acute stroke in November 2021, personally reviewed MRI of the brain, left basal ganglion acute infarction, and left paramedian pontine lacunar infarction  CT angiogram showed no large vessel disease, moderate bilateral M2, left P2, ACA stenosis  Echocardiogram 40 to 50%  TEE small PFO, felt to be incidental  Lower extremity venous Doppler was negative, LDL was 173, A1c was 6.8, he had loop recorder placement  He now lives at home with his son, came in wheelchair today, spent most of the time in sitting position, wearing right AFO, need help transfer, putting on clothes, complains of right shoulder elbow finger pain with passive stretch, he  does have caregiver 7 hours each day  Update December 07, 2021: Electrical stimulation guided xeomin injection for spastic right hemiparesis, we use xeomin 600 units daily, focus on right upper extremity  Update March 01, 2022, He responded well to his injection, son reported that he can stretch his right finger much better, no significant side effect, he is receiving home PT OT  UPDATE May 24 2022: He responded well to previous injection, right shoulder, elbow, and less painful, less spasm, per insurance requirement, changed to Dysport 500 units x 3 today,  UPDATE Aug 23 2022: Previous injection did help his right shoulder pain, no side effect, wear right AFO, take few steps,  UPDATE November 23 2023: He responded well to previous injection, no significant side effect noted  PHYSICAL EXAM:   125/80 HR 90  PHYSICAL EXAMNIATION:  Gen: NAD, conversant, well nourised, well groomed                     Cardiovascular: Regular rate rhythm, no peripheral edema, warm, nontender. Eyes: Conjunctivae clear without exudates or hemorrhage Neck: Supple, no carotid bruits. Pulmonary: Clear to auscultation bilaterally   NEUROLOGICAL EXAM:  MENTAL STATUS: Speech/cognition: Dysarthria, aphasia, following commands, mild finger agnosia,   CN VII: Right lower face weakness   MOTOR: Spastic right hemiparesis, involving right upper and lower extremity, no antigravity movement,  painful on passive stretch of the right shoulder, elbow, able to straight out right hand, mild fixed spasticity of right knee, tendency for right knee adduction, ankle plantarflexion, wear right AFO    REVIEW OF SYSTEMS:  Full 14 system review of  systems performed and notable only for as above All other review of systems were negative.   ALLERGIES: No Known Allergies  HOME MEDICATIONS: Current Outpatient Medications  Medication Sig Dispense Refill   AbobotulinumtoxinA (DYSPORT) 500 units SOLR injection Inject into the  muscles every 3 months. 3 each 2   amLODipine (NORVASC) 10 MG tablet Take 1 tablet by mouth daily.     apixaban (ELIQUIS) 5 MG TABS tablet Take 5 mg by mouth 2 (two) times daily.     atorvastatin (LIPITOR) 40 MG tablet TAKE ONE TABLET BY MOUTH AT BEDTIME FOR CHOLESTEROL FOR CHOLESTEROL     baclofen (LIORESAL) 10 MG tablet Take 10 mg by mouth 3 (three) times daily.     carvedilol (COREG) 25 MG tablet Take 0.5 tablets by mouth daily.     Cholecalciferol 50 MCG (2000 UT) TABS Take 1 tablet by mouth daily.     hydrALAZINE (APRESOLINE) 25 MG tablet Take 25 mg by mouth 3 (three) times daily.     Lancets MISC 1 application by Does not apply route daily. 100 each 11   omeprazole (PRILOSEC) 20 MG capsule Take 20 mg by mouth daily.     potassium chloride (KLOR-CON) 10 MEQ tablet Take 10 mEq by mouth 2 (two) times daily.     senna (SENOKOT) 8.6 MG tablet Take 1 tablet by mouth 2 (two) times daily.     sertraline (ZOLOFT) 100 MG tablet Take 1 tablet by mouth daily.     Current Facility-Administered Medications  Medication Dose Route Frequency Provider Last Rate Last Admin   AbobotulinumtoxinA (DYSPORT) 500 units injection 500 Units  500 Units Intramuscular Once Levert Feinstein, MD        PAST MEDICAL HISTORY: 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   CONGESTIVE HEART FAILURE 11/12/2007   DIABETES MELLITUS, TYPE II 11/20/2008   HYPERLIPIDEMIA 11/12/2007   HYPERTENSION 11/12/2007   TACHYCARDIA 01/24/2010    PAST SURGICAL HISTORY: Past Surgical History:  Procedure Laterality Date   BUBBLE STUDY  03/16/2020   Procedure: BUBBLE STUDY;  Surgeon: Jodelle Red, MD;  Location[facility] ENDOSCOPY;  Service: Cardiovascular;;   LOOP RECORDER INSERTION N/A 03/23/2020   Procedure: LOOP RECORDER INSERTION;  Surgeon: Hillis Range, MD;  Location: [facility] INVASIVE CV LAB;  Service: Cardiovascular;  Laterality: N/A;   TEE WITHOUT CARDIOVERSION N/A 03/16/2020   Procedure:  TRANSESOPHAGEAL ECHOCARDIOGRAM (TEE);  Surgeon: Jodelle Red, MD;  Location: [facility] ENDOSCOPY;  Service: Cardiovascular;  Laterality: N/A;   TONSILLECTOMY  1970    FAMILY HISTORY: Family History  Problem Relation Age of Onset   Kidney disease Mother     SOCIAL HISTORY: Social History   Socioeconomic History   Marital status: Single    Spouse name: Not on file   Number of children: Not on file   Years of education: Not on file   Highest education level: Not on file  Occupational History   Not on file  Tobacco Use   Smoking status: Never   Smokeless tobacco: Never  Substance and Sexual Activity   Alcohol use: Yes    Comment: social   Drug use: No   Sexual activity: Not on file  Other Topics Concern   Not on file  Social History Narrative   Not on file   Social Determinants of Health   Financial Resource Strain: Not on file  Food Insecurity: Not on file  Transportation Needs: Not on file  Physical Activity: Not on file  Stress: Not  on file  Social Connections: Not on file  Intimate Partner Violence: Not on file      Levert Feinstein, M.D. Ph.D.  [facility] [address], [address] [place], [place] [address] Ph: [phone] Fax: [phone]  CC:  Clinic, [facility] [address] [place],  [place] [address]  Patient, No Pcp Per

## 2022-11-23 NOTE — Progress Notes (Signed)
Dysport 500units x 1 vial  [id number] Exp-01.31.2026 B/B  Bacteriostatic 0.9% Sodium Chloride- 2.5 mL  Lot: [id number] Expiration: 04.01.2025 NDC: [id number] Dx: G81.11 WITNESSED UY:[id number], CMA

## 2022-12-20 ENCOUNTER — Other Ambulatory Visit: Payer: Self-pay

## 2022-12-20 ENCOUNTER — Emergency Department (HOSPITAL_COMMUNITY): Payer: No Typology Code available for payment source

## 2022-12-20 ENCOUNTER — Inpatient Hospital Stay (HOSPITAL_COMMUNITY)
Admission: EM | Admit: 2022-12-20 | Discharge: 2022-12-23 | DRG: 194 | Disposition: A | Discharge status: Home Health | Payer: No Typology Code available for payment source | Attending: Internal Medicine | Admitting: Internal Medicine

## 2022-12-20 DIAGNOSIS — Z7401 Bed confinement status: Secondary | ICD-10-CM

## 2022-12-20 DIAGNOSIS — I480 Paroxysmal atrial fibrillation: Secondary | ICD-10-CM | POA: Diagnosis present

## 2022-12-20 DIAGNOSIS — I429 Cardiomyopathy, unspecified: Secondary | ICD-10-CM | POA: Diagnosis present

## 2022-12-20 DIAGNOSIS — Z1152 Encounter for screening for COVID-19: Secondary | ICD-10-CM

## 2022-12-20 DIAGNOSIS — E782 Mixed hyperlipidemia: Secondary | ICD-10-CM | POA: Diagnosis present

## 2022-12-20 DIAGNOSIS — E869 Volume depletion, unspecified: Secondary | ICD-10-CM | POA: Diagnosis present

## 2022-12-20 DIAGNOSIS — Z993 Dependence on wheelchair: Secondary | ICD-10-CM

## 2022-12-20 DIAGNOSIS — K219 Gastro-esophageal reflux disease without esophagitis: Secondary | ICD-10-CM | POA: Diagnosis present

## 2022-12-20 DIAGNOSIS — Z841 Family history of disorders of kidney and ureter: Secondary | ICD-10-CM

## 2022-12-20 DIAGNOSIS — J189 Pneumonia, unspecified organism: Secondary | ICD-10-CM | POA: Diagnosis not present

## 2022-12-20 DIAGNOSIS — I69359 Hemiplegia and hemiparesis following cerebral infarction affecting unspecified side: Secondary | ICD-10-CM

## 2022-12-20 DIAGNOSIS — I10 Essential (primary) hypertension: Secondary | ICD-10-CM | POA: Diagnosis present

## 2022-12-20 DIAGNOSIS — R000 Tachycardia, unspecified: Secondary | ICD-10-CM | POA: Diagnosis present

## 2022-12-20 DIAGNOSIS — I509 Heart failure, unspecified: Secondary | ICD-10-CM | POA: Diagnosis present

## 2022-12-20 DIAGNOSIS — Z7901 Long term (current) use of anticoagulants: Secondary | ICD-10-CM

## 2022-12-20 DIAGNOSIS — I959 Hypotension, unspecified: Secondary | ICD-10-CM | POA: Diagnosis present

## 2022-12-20 DIAGNOSIS — E119 Type 2 diabetes mellitus without complications: Secondary | ICD-10-CM | POA: Diagnosis present

## 2022-12-20 DIAGNOSIS — E876 Hypokalemia: Secondary | ICD-10-CM | POA: Diagnosis present

## 2022-12-20 DIAGNOSIS — Z79899 Other long term (current) drug therapy: Secondary | ICD-10-CM

## 2022-12-20 DIAGNOSIS — I69351 Hemiplegia and hemiparesis following cerebral infarction affecting right dominant side: Secondary | ICD-10-CM

## 2022-12-20 DIAGNOSIS — F32A Depression, unspecified: Secondary | ICD-10-CM | POA: Diagnosis present

## 2022-12-20 DIAGNOSIS — I110 Hypertensive heart disease with heart failure: Secondary | ICD-10-CM | POA: Diagnosis present

## 2022-12-20 LAB — BASIC METABOLIC PANEL
Anion gap: 10 (ref 5–15)
BUN: 18 mg/dL (ref 8–23)
CO2: 24 mmol/L (ref 22–32)
Calcium: 8.6 mg/dL — ABNORMAL LOW (ref 8.9–10.3)
Chloride: 105 mmol/L (ref 98–111)
Creatinine, Ser: 0.9 mg/dL (ref 0.61–1.24)
GFR, Estimated: >60 mL/min (ref >60)
Glucose, Bld: 122 mg/dL — ABNORMAL HIGH (ref 70–99)
Potassium: 3.2 mmol/L — ABNORMAL LOW (ref 3.5–5.1)
Sodium: 139 mmol/L (ref 135–145)

## 2022-12-20 LAB — MAGNESIUM: Magnesium: 2 mg/dL (ref 1.7–2.4)

## 2022-12-20 LAB — CBC
HCT: 42.7 % (ref 39.0–52.0)
Hemoglobin: 13.8 g/dL (ref 13.0–17.0)
MCH: 31.2 pg (ref 26.0–34.0)
MCHC: 32.3 g/dL (ref 30.0–36.0)
MCV: 96.6 fL (ref 80.0–100.0)
Platelets: 236 10*3/uL (ref 150–400)
RBC: 4.42 MIL/uL (ref 4.22–5.81)
RDW: 14 % (ref 11.5–15.5)
WBC: 7.7 10*3/uL (ref 4.0–10.5)
nRBC: 0 % (ref 0.0–0.2)

## 2022-12-20 LAB — URINALYSIS, ROUTINE W REFLEX MICROSCOPIC
Bacteria, UA: NONE SEEN
Bilirubin Urine: NEGATIVE
Glucose, UA: NEGATIVE mg/dL
Hgb urine dipstick: NEGATIVE
Ketones, ur: NEGATIVE mg/dL
Leukocytes,Ua: NEGATIVE
Nitrite: NEGATIVE
Protein, ur: 100 mg/dL — AB
Specific Gravity, Urine: 1.02 (ref 1.005–1.030)
pH: 5 (ref 5.0–8.0)

## 2022-12-20 MED ORDER — IOHEXOL 350 MG/ML SOLN
80.0000 mL | Freq: Once | INTRAVENOUS | Status: AC | PRN
  Administered 2022-12-20: 80 mL via INTRAVENOUS

## 2022-12-20 MED ORDER — DOXYCYCLINE HYCLATE 100 MG PO TABS
100.0000 mg | ORAL_TABLET | Freq: Once | ORAL | Status: AC
  Administered 2022-12-20: 100 mg via ORAL
  Filled 2022-12-20: qty 1

## 2022-12-20 MED ORDER — SODIUM CHLORIDE 0.9 % IV SOLN
1.0000 g | Freq: Once | INTRAVENOUS | Status: AC
  Administered 2022-12-20: 1 g via INTRAVENOUS
  Filled 2022-12-20: qty 10

## 2022-12-20 MED ORDER — CARVEDILOL 12.5 MG PO TABS
12.5000 mg | ORAL_TABLET | Freq: Two times a day (BID) | ORAL | Status: DC
  Administered 2022-12-20: 12.5 mg via ORAL
  Filled 2022-12-20: qty 1

## 2022-12-20 MED ORDER — HYDRALAZINE HCL 25 MG PO TABS
25.0000 mg | ORAL_TABLET | Freq: Once | ORAL | Status: AC
  Administered 2022-12-20: 25 mg via ORAL
  Filled 2022-12-20: qty 1

## 2022-12-20 MED ORDER — CARVEDILOL 3.125 MG PO TABS: 3.1250 mg | ORAL_TABLET | Freq: Two times a day (BID) | ORAL | Status: DC

## 2022-12-20 MED ORDER — CARVEDILOL 12.5 MG PO TABS
12.5000 mg | ORAL_TABLET | Freq: Two times a day (BID) | ORAL | Status: DC
  Filled 2022-12-20: qty 1

## 2022-12-20 MED ORDER — CARVEDILOL 3.125 MG PO TABS
3.1250 mg | ORAL_TABLET | Freq: Two times a day (BID) | ORAL | Status: DC
  Administered 2022-12-20: 3.125 mg via ORAL
  Filled 2022-12-20: qty 1

## 2022-12-20 MED ORDER — DOXYCYCLINE HYCLATE 100 MG PO CAPS: 100.0000 mg | ORAL_CAPSULE | Freq: Two times a day (BID) | ORAL | 0 refills | Status: DC

## 2022-12-20 NOTE — ED Notes (Signed)
Contacted [facility] for transport 

## 2022-12-20 NOTE — ED Notes (Signed)
Contacted son to discuss discharge instructions with him. Son agreed to all instructions and demonstrated understanding of all instructions

## 2022-12-20 NOTE — ED Triage Notes (Signed)
Pt BIBA from home. C/o tachycardia 115-140. Pt states they are normally elevated. Pt has no other complaints at this time  AOx4

## 2022-12-20 NOTE — ED Provider Notes (Signed)
[facility] EMERGENCY DEPARTMENT AT [facility] Provider Note   CSN: [id number] Arrival date & time: 12/20/22  1607     History {Add pertinent medical, surgical, social history, OB history to HPI:1} Chief Complaint  Patient presents with   Tachycardia    Trevor Anderson is a 67 y.o. male presenting from home by EMS for concern for tachycardia.  The patient reports that his PT evaluation noted his heart rate was as high as 140 today.  He says he typically has a fast heart rate at baseline.  He has no complaints.  Denies chest pressure or shortness of breath or lightheadedness.  His son Trevor Anderson who lives with him confirms that the patient's home health nurse was concerned or his physical therapist was concerned of the patient's resting heart rate has been "increasing over the past 2 months".  The patient has reported history of A-fib and has a loop recorder placed by the [place] clinic that he follows with.  The patient is on Eliquis for A-fib.  There is a history of left basal ganglier infarct with residual right spastic hemiparesis per my review of external records, including neurology evaluation.  He also has a history of hypertension, hyperlipidemia, diabetes, and cardiomyopathy with congestive heart failure.  He is on low-dose Coreg daily as well as hydralazine, and Eliquis for A-fib.  HPI     Home Medications Prior to Admission medications   Medication Sig Start Date End Date Taking? Authorizing Provider  naloxone [facility]) nasal spray 4 mg/0.1 mL Place 1 spray into the nose See admin instructions. "CALL 911 IMMEDIATELY, ADMINISTER DOSE, THEN TURN PERSON ON SIDE - IF NO RESPONSE IN 2-3 MINUTES OR PERSON RESPONDS BUT RELAPSES, REPEAT USING A NEW SPRAY DEVICE AND SPRAY INTO THE OTHER NOSTRIL FOR OPIOID REVERSAL" 07/17/22  Yes [provider]  traMADol (ULTRAM) 50 MG tablet Take 50 mg by mouth. 11/14/22  Yes [provider]  AbobotulinumtoxinA (DYSPORT) 500 units SOLR  injection Inject into the muscles every 3 months. 05/09/22   Levert Feinstein, MD  amLODipine (NORVASC) 10 MG tablet Take 1 tablet by mouth daily. 07/22/21   [provider]  apixaban (ELIQUIS) 5 MG TABS tablet Take 5 mg by mouth 2 (two) times daily.    [provider]  atorvastatin (LIPITOR) 40 MG tablet TAKE ONE TABLET BY MOUTH AT BEDTIME FOR CHOLESTEROL FOR CHOLESTEROL 07/14/21   [provider]  baclofen (LIORESAL) 10 MG tablet Take 10 mg by mouth 3 (three) times daily.    [provider]  carvedilol (COREG) 25 MG tablet Take 0.5 tablets by mouth daily. 07/14/21   [provider]  Cholecalciferol 50 MCG (2000 UT) TABS Take 1 tablet by mouth daily. 10/18/20   [provider]  hydrALAZINE (APRESOLINE) 25 MG tablet Take 25 mg by mouth 3 (three) times daily.    [provider]  Lancets MISC 1 application by Does not apply route daily. 05/29/11   [name], MD  omeprazole (PRILOSEC) 20 MG capsule Take 20 mg by mouth daily.    [provider]  potassium chloride (KLOR-CON) 10 MEQ tablet Take 10 mEq by mouth 2 (two) times daily. 10/13/20   [provider]  senna (SENOKOT) 8.6 MG tablet Take 1 tablet by mouth 2 (two) times daily. 09/23/20   [provider]  sertraline (ZOLOFT) 100 MG tablet Take 1 tablet by mouth daily. 01/26/21   [provider]      Allergies  Patient has no known allergies.    Review of Systems   Review of Systems  Physical Exam Updated Vital Signs BP (!) 178/114 (BP Location: Left Arm)   Pulse 91   Temp 98.2 F (36.8 C) (Oral)   Resp (!) 25   Ht 5\' 11"  (1.803 m)   Wt 97.5 kg   SpO2 98%   BMI 29.99 kg/m  Physical Exam Constitutional:      General: He is not in acute distress. HENT:     Head: Normocephalic and atraumatic.  Eyes:     Conjunctiva/sclera: Conjunctivae normal.     Pupils: Pupils are equal, round, and reactive to light.  Cardiovascular:     Rate and Rhythm:  Regular rhythm. Tachycardia present.  Pulmonary:     Effort: Pulmonary effort is normal. No respiratory distress.     Breath sounds: Normal breath sounds.  Abdominal:     General: There is no distension.     Tenderness: There is no abdominal tenderness.  Skin:    General: Skin is warm and dry.  Neurological:     General: No focal deficit present.     Mental Status: He is alert. Mental status is at baseline.  Psychiatric:        Mood and Affect: Mood normal.        Behavior: Behavior normal.     ED Results / Procedures / Treatments   Labs (all labs ordered are listed, but only abnormal results are displayed) Labs Reviewed  BASIC METABOLIC PANEL - Abnormal; Notable for the following components:      Result Value   Potassium 3.2 (*)    Glucose, Bld 122 (*)    Calcium 8.6 (*)    All other components within normal limits  URINALYSIS, ROUTINE W REFLEX MICROSCOPIC - Abnormal; Notable for the following components:   Protein, ur 100 (*)    All other components within normal limits  CBC  MAGNESIUM    EKG EKG Interpretation Date/Time:  Wednesday December 20 2022 16:19:46 EDT Ventricular Rate:  117 PR Interval:  158 QRS Duration:  91 QT Interval:  333 QTC Calculation: 465 R Axis:   56  Text Interpretation: Sinus tachycardia Abnormal R-wave progression, late transition Confirmed by Alvester Chou [phone]) on 12/20/2022 4:26:07 PM  Radiology CT Angio Chest PE W and/or Wo Contrast  Result Date: 12/20/2022 CLINICAL DATA:  High probability for PE, tachycardia. EXAM: CT ANGIOGRAPHY CHEST WITH CONTRAST TECHNIQUE: Multidetector CT imaging of the chest was performed using the standard protocol during bolus administration of intravenous contrast. Multiplanar CT image reconstructions and MIPs were obtained to evaluate the vascular anatomy. RADIATION DOSE REDUCTION: This exam was performed according to the departmental dose-optimization program which includes automated exposure control,  adjustment of the mA and/or kV according to patient size and/or use of iterative reconstruction technique. CONTRAST:  80mL OMNIPAQUE IOHEXOL 350 MG/ML SOLN COMPARISON:  None Available. FINDINGS: Cardiovascular: Satisfactory opacification of the pulmonary arteries to the segmental level. No evidence of pulmonary embolism. Normal heart size. No pericardial effusion. Mediastinum/Nodes: No enlarged mediastinal, hilar, or axillary lymph nodes. Thyroid gland, trachea, and esophagus demonstrate no significant findings. Lungs/Pleura: There is mild elevation of the right hemidiaphragm. There is a band of atelectasis/airspace disease in the inferior right upper lobe abutting the minor fissure. There is also small amount of airspace disease in the central right lower lobe near the hilum with atelectasis in the bilateral lung bases, right greater than left. There is no pleural effusion or  pneumothorax. Upper Abdomen: Subcentimeter renal cysts are present. No acute abnormality. Musculoskeletal: Degenerative changes affect the spine. Review of the MIP images confirms the above findings. IMPRESSION: 1. No evidence for pulmonary embolism. 2. Mild elevation of the right hemidiaphragm. 3. Right upper lobe and right lower lobe airspace disease worrisome for pneumonia. Recommend follow-up imaging to resolution. Recommend follow-up PA and lateral chest x-ray in 4-6 weeks to confirm resolution. 4. Bilateral subcentimeter renal cysts. No follow-up imaging recommended. Electronically Signed   By: Darliss Cheney M.D.   On: 12/20/2022 20:10    Procedures Procedures  {Document cardiac monitor, telemetry assessment procedure when appropriate:1}  Medications Ordered in ED Medications  carvedilol (COREG) tablet 12.5 mg (12.5 mg Oral Given 12/20/22 1808)  cefTRIAXone (ROCEPHIN) 1 g in sodium chloride 0.9 % 100 mL IVPB (1 g Intravenous New Bag/Given 12/20/22 2040)  hydrALAZINE (APRESOLINE) tablet 25 mg (25 mg Oral Given 12/20/22 1808)   iohexol (OMNIPAQUE) 350 MG/ML injection 80 mL (80 mLs Intravenous Contrast Given 12/20/22 1936)  doxycycline (VIBRA-TABS) tablet 100 mg (100 mg Oral Given 12/20/22 2040)    ED Course/ Medical Decision Making/ A&P Clinical Course as of 12/20/22 2102  Wed Dec 20, 2022  1745 Reordered home evening hydralazine and coreg medication [MT]    Clinical Course User Index [MT] Erynne Kealey, Kermit Balo, MD   {   Click here for ABCD2, HEART and other calculatorsREFRESH Note before signing :1}                              Medical Decision Making Amount and/or Complexity of Data Reviewed Labs: ordered. Radiology: ordered.  Risk Prescription drug management.   This patient presents to the ED with concern for tachycardia, shortness of breath. This involves an extensive number of treatment options, and is a complaint that carries with it a high risk of complications and morbidity.  The differential diagnosis includes pneumonia was pleural effusion versus PE anemia versus other  Co-morbidities that complicate the patient evaluation: History of hemiparesis, high risk aspiration  Additional history obtained from EMS and patient's son at bedside  External records from outside source obtained and reviewed including prior EKG showing heart rate 100 bpm on average  I ordered and personally interpreted labs.  The pertinent results include: Mild hypokalemia, otherwise no emergent findings.  White blood cell count within normal limits, no acute anemia  I ordered imaging studies including CT PE I independently visualized and interpreted imaging which showed concern for right upper lobe pneumonia, no acute PE I agree with the radiologist interpretation  The patient was maintained on a cardiac monitor.  I personally viewed and interpreted the cardiac monitored which showed an underlying rhythm of: Sinus tachycardia  Per my interpretation the patient's ECG shows sinus tachycardia  I ordered medication including  Coreg and hydralazine Home medications for heart rate control as well as hypertension control.  IV Rocephin and doxycycline for suspected pneumonia, which may be aspiration.  I have reviewed the patients home medicines and have made adjustments as needed  Test Considered: No indication for abdominal imaging at this time, low suspicion for acute intra-abdominal process.  After the interventions noted above, I reevaluated the patient and found that they have: improved  Right is improved with medications here in the ED.  Dispostion:  After consideration of the diagnostic results and the patients response to treatment, I feel that the patent would benefit from outpatient follow up.   {  Document critical care time when appropriate:1} {Document review of labs and clinical decision tools ie heart score, Chads2Vasc2 etc:1}  {Document your independent review of radiology images, and any outside records:1} {Document your discussion with family members, caretakers, and with consultants:1} {Document social determinants of health affecting pt's care:1} {Document your decision making why or why not admission, treatments were needed:1} Final Clinical Impression(s) / ED Diagnoses Final diagnoses:  None    Rx / DC Orders ED Discharge Orders     None

## 2022-12-21 ENCOUNTER — Inpatient Hospital Stay (HOSPITAL_COMMUNITY): Payer: No Typology Code available for payment source

## 2022-12-21 DIAGNOSIS — I480 Paroxysmal atrial fibrillation: Secondary | ICD-10-CM | POA: Diagnosis present

## 2022-12-21 DIAGNOSIS — I429 Cardiomyopathy, unspecified: Secondary | ICD-10-CM | POA: Diagnosis present

## 2022-12-21 DIAGNOSIS — E869 Volume depletion, unspecified: Secondary | ICD-10-CM | POA: Diagnosis present

## 2022-12-21 DIAGNOSIS — Z7401 Bed confinement status: Secondary | ICD-10-CM | POA: Diagnosis not present

## 2022-12-21 DIAGNOSIS — J189 Pneumonia, unspecified organism: Principal | ICD-10-CM

## 2022-12-21 DIAGNOSIS — I69351 Hemiplegia and hemiparesis following cerebral infarction affecting right dominant side: Secondary | ICD-10-CM | POA: Diagnosis not present

## 2022-12-21 DIAGNOSIS — E782 Mixed hyperlipidemia: Secondary | ICD-10-CM | POA: Diagnosis present

## 2022-12-21 DIAGNOSIS — I110 Hypertensive heart disease with heart failure: Secondary | ICD-10-CM | POA: Diagnosis present

## 2022-12-21 DIAGNOSIS — I509 Heart failure, unspecified: Secondary | ICD-10-CM | POA: Diagnosis present

## 2022-12-21 DIAGNOSIS — Z79899 Other long term (current) drug therapy: Secondary | ICD-10-CM | POA: Diagnosis not present

## 2022-12-21 DIAGNOSIS — K219 Gastro-esophageal reflux disease without esophagitis: Secondary | ICD-10-CM | POA: Diagnosis present

## 2022-12-21 DIAGNOSIS — Z7901 Long term (current) use of anticoagulants: Secondary | ICD-10-CM | POA: Diagnosis not present

## 2022-12-21 DIAGNOSIS — F32A Depression, unspecified: Secondary | ICD-10-CM | POA: Diagnosis present

## 2022-12-21 DIAGNOSIS — Z993 Dependence on wheelchair: Secondary | ICD-10-CM | POA: Diagnosis not present

## 2022-12-21 DIAGNOSIS — Z1152 Encounter for screening for COVID-19: Secondary | ICD-10-CM | POA: Diagnosis not present

## 2022-12-21 DIAGNOSIS — E876 Hypokalemia: Secondary | ICD-10-CM | POA: Diagnosis present

## 2022-12-21 DIAGNOSIS — I959 Hypotension, unspecified: Secondary | ICD-10-CM | POA: Diagnosis present

## 2022-12-21 DIAGNOSIS — Z841 Family history of disorders of kidney and ureter: Secondary | ICD-10-CM | POA: Diagnosis not present

## 2022-12-21 DIAGNOSIS — E119 Type 2 diabetes mellitus without complications: Secondary | ICD-10-CM | POA: Diagnosis present

## 2022-12-21 DIAGNOSIS — R000 Tachycardia, unspecified: Secondary | ICD-10-CM | POA: Diagnosis present

## 2022-12-21 LAB — RESPIRATORY PANEL BY PCR

## 2022-12-21 LAB — PROCALCITONIN: Procalcitonin: <0.1 ng/mL

## 2022-12-21 LAB — HIV ANTIBODY (ROUTINE TESTING W REFLEX): HIV Screen 4th Generation wRfx: NONREACTIVE

## 2022-12-21 LAB — LACTIC ACID, PLASMA: Lactic Acid, Venous: 1.8 mmol/L (ref 0.5–1.9)

## 2022-12-21 MED ORDER — ONDANSETRON HCL 4 MG PO TABS: 4.0000 mg | ORAL_TABLET | Freq: Four times a day (QID) | ORAL | Status: DC | PRN

## 2022-12-21 MED ORDER — ALBUTEROL SULFATE (2.5 MG/3ML) 0.083% IN NEBU: 2.5000 mg | INHALATION_SOLUTION | RESPIRATORY_TRACT | Status: DC | PRN

## 2022-12-21 MED ORDER — SODIUM CHLORIDE 0.9 % IV SOLN: INTRAVENOUS | Status: DC

## 2022-12-21 MED ORDER — BACLOFEN 10 MG PO TABS
10.0000 mg | ORAL_TABLET | Freq: Three times a day (TID) | ORAL | Status: DC
  Administered 2022-12-21 – 2022-12-23 (×7): 10 mg via ORAL
  Filled 2022-12-21 (×7): qty 1

## 2022-12-21 MED ORDER — SODIUM CHLORIDE 0.9 % IV BOLUS
1000.0000 mL | Freq: Once | INTRAVENOUS | Status: AC
  Administered 2022-12-21: 1000 mL via INTRAVENOUS

## 2022-12-21 MED ORDER — SODIUM CHLORIDE 0.9 % IV SOLN
1.0000 g | INTRAVENOUS | Status: DC
  Administered 2022-12-21 – 2022-12-22 (×2): 1 g via INTRAVENOUS
  Filled 2022-12-21 (×3): qty 10

## 2022-12-21 MED ORDER — ACETAMINOPHEN 325 MG PO TABS: 650.0000 mg | ORAL_TABLET | Freq: Four times a day (QID) | ORAL | Status: DC | PRN

## 2022-12-21 MED ORDER — POTASSIUM CHLORIDE CRYS ER 20 MEQ PO TBCR
20.0000 meq | EXTENDED_RELEASE_TABLET | Freq: Every day | ORAL | Status: DC
  Administered 2022-12-22 – 2022-12-23 (×2): 20 meq via ORAL
  Filled 2022-12-21 (×2): qty 1

## 2022-12-21 MED ORDER — PANTOPRAZOLE SODIUM 40 MG PO TBEC
40.0000 mg | DELAYED_RELEASE_TABLET | Freq: Every day | ORAL | Status: DC
  Administered 2022-12-21 – 2022-12-23 (×3): 40 mg via ORAL
  Filled 2022-12-21 (×3): qty 1

## 2022-12-21 MED ORDER — ENSURE ENLIVE PO LIQD
237.0000 mL | Freq: Two times a day (BID) | ORAL | Status: DC
  Administered 2022-12-22 – 2022-12-23 (×4): 237 mL via ORAL

## 2022-12-21 MED ORDER — ACETAMINOPHEN 650 MG RE SUPP: 650.0000 mg | Freq: Four times a day (QID) | RECTAL | Status: DC | PRN

## 2022-12-21 MED ORDER — APIXABAN 5 MG PO TABS
5.0000 mg | ORAL_TABLET | Freq: Two times a day (BID) | ORAL | Status: DC
  Administered 2022-12-21 – 2022-12-23 (×5): 5 mg via ORAL
  Filled 2022-12-21 (×5): qty 1

## 2022-12-21 MED ORDER — SODIUM CHLORIDE 0.9 % IV SOLN
500.0000 mg | INTRAVENOUS | Status: DC
  Administered 2022-12-21 – 2022-12-23 (×3): 500 mg via INTRAVENOUS
  Filled 2022-12-21 (×3): qty 5

## 2022-12-21 MED ORDER — POTASSIUM CHLORIDE CRYS ER 20 MEQ PO TBCR
40.0000 meq | EXTENDED_RELEASE_TABLET | Freq: Once | ORAL | Status: AC
  Administered 2022-12-21: 40 meq via ORAL
  Filled 2022-12-21: qty 2

## 2022-12-21 MED ORDER — SERTRALINE HCL 100 MG PO TABS
100.0000 mg | ORAL_TABLET | Freq: Every day | ORAL | Status: DC
  Administered 2022-12-21 – 2022-12-23 (×3): 100 mg via ORAL
  Filled 2022-12-21 (×2): qty 1
  Filled 2022-12-21: qty 2

## 2022-12-21 MED ORDER — CARVEDILOL 12.5 MG PO TABS: 12.5000 mg | ORAL_TABLET | Freq: Two times a day (BID) | ORAL | Status: DC

## 2022-12-21 MED ORDER — ONDANSETRON HCL 4 MG/2ML IJ SOLN: 4.0000 mg | Freq: Four times a day (QID) | INTRAMUSCULAR | Status: DC | PRN

## 2022-12-21 MED ORDER — ATORVASTATIN CALCIUM 40 MG PO TABS
80.0000 mg | ORAL_TABLET | Freq: Every day | ORAL | Status: DC
  Administered 2022-12-21 – 2022-12-22 (×2): 80 mg via ORAL
  Filled 2022-12-21 (×2): qty 2

## 2022-12-21 MED ORDER — CARVEDILOL 6.25 MG PO TABS
6.2500 mg | ORAL_TABLET | Freq: Two times a day (BID) | ORAL | Status: DC
  Administered 2022-12-21 – 2022-12-23 (×5): 6.25 mg via ORAL
  Filled 2022-12-21 (×3): qty 1
  Filled 2022-12-21: qty 2
  Filled 2022-12-21: qty 1

## 2022-12-21 MED ORDER — TRAZODONE HCL 50 MG PO TABS: 25.0000 mg | ORAL_TABLET | Freq: Every evening | ORAL | Status: DC | PRN

## 2022-12-21 NOTE — ED Provider Notes (Signed)
  12:30 AM Notified by nursing that [facility] was here to pick patient up, however he is now profoundly tachycardic and hypotensive in the 80s systolic.  Patient has been up for discharge for almost 3.5 hours now.  Work-up reviewed from today-- normal WBC count, K+ mildly low at 3.2.  No replacement was given so this has been ordered.  While in the ED patient was given hydralazine as well as coreg.  CTA with RUL and RLL airspace disease-- given rocephin and doxycycline.  HR transiently improved but now uncontrolled again.  Given he is now tachycardic (despite meds including BBlocker) and hypotensive, will add on lactate, cultures, given IVF.  Will likely need admission at this point.  Lactate 1.8.  BP improved after liter of IVF, now 97/76, however still quite tachycardic at 150. No chane with IVF.  Chart reviewed-- at most recent neurology visits this month HR has been in the 80's, 90's.  Repeat EKG HR 150's, appears sinus tach.  Discussed with Dr. Imogene Burn-- will admit for ongoing care.   Garlon Hatchet, PA-C 12/21/22 [id number]    Nicanor Alcon, April, MD 12/21/22 [phone]

## 2022-12-21 NOTE — Plan of Care (Signed)
  Problem: Education: Goal: Knowledge of General Education information will improve Description: Including pain rating scale, medication(s)/side effects and non-pharmacologic comfort measures Outcome: Progressing   Problem: Clinical Measurements: Goal: Cardiovascular complication will be avoided Outcome: Progressing   Problem: Safety: Goal: Ability to remain free from injury will improve Outcome: Progressing   Problem: Skin Integrity: Goal: Risk for impaired skin integrity will decrease Outcome: Progressing   Problem: Clinical Measurements: Goal: Respiratory complications will improve Outcome: Not Progressing Note: CAP

## 2022-12-21 NOTE — H&P (Addendum)
History and Physical  Trevor Anderson:[id number] DOB: 1955-10-22 DOA: 12/20/2022  PCP: [name], MD   Chief Complaint: Tachycardia  HPI: Trevor Anderson is a 67 y.o. 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  He is followed by [facility], and by cardiology at the [facility].  Earlier this year, loop recorder detected some atrial fibrillation, since which time he has been on Coreg and Eliquis.  He has physical therapy at home, states that over the last couple of months therapist have noted that his heart rate has been slowly rising.  Yesterday they noticed that his heart rate was in the 140s persistently, and he was brought to the ER for evaluation.  Patient tells me he has not had any fevers, chills, cough, dizziness, shortness of breath, orthopnea.  Denies any lower extremity swelling, nausea, vomiting, or changes in his medications for the last couple of weeks.  Denies any chest pain, palpitations, or chest pressure.  Tells me that he eats well, eats regular food, but says that he hardly drinks any fluids, just does not want to.  Knows that he needs to drink more.  ED Course: On evaluation here in the emergency department, was documented to be tachycardic as high as 154, blood pressure was as low as 82/73.  He was given 1 L IV fluid bolus, and small dose of Coreg.  He was noted to be in sinus tachycardia.  CTA chest was performed, shows evidence of right sided consolidation.  He was started on IV antibiotics, blood pressure has now improved to 112/75, remains persistently tachycardic in the 140s.  Due to persistent tachycardia, he has been referred for hospitalist admission.  Review of Systems: Please see HPI for pertinent positives and negatives. A complete 10 system review of systems are otherwise  negative.  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   CONGESTIVE HEART FAILURE 11/12/2007   DIABETES MELLITUS, TYPE II 11/20/2008   HYPERLIPIDEMIA 11/12/2007   HYPERTENSION 11/12/2007   TACHYCARDIA 01/24/2010   Past Surgical History:  Procedure Laterality Date   BUBBLE STUDY  03/16/2020   Procedure: BUBBLE STUDY;  Surgeon: Jodelle Red, MD;  Location: [facility] ENDOSCOPY;  Service: Cardiovascular;;   LOOP RECORDER INSERTION N/A 03/23/2020   Procedure: LOOP RECORDER INSERTION;  Surgeon: Hillis Range, MD;  Location: [facility] INVASIVE CV LAB;  Service: Cardiovascular;  Laterality: N/A;   TEE WITHOUT CARDIOVERSION N/A 03/16/2020   Procedure: TRANSESOPHAGEAL ECHOCARDIOGRAM (TEE);  Surgeon: Jodelle Red, MD;  Location: [facility] ENDOSCOPY;  Service: Cardiovascular;  Laterality: N/A;   TONSILLECTOMY  1970    Social History:  reports that he has never smoked. He has never used smokeless tobacco. He reports current alcohol use. He reports that he does not use drugs.   No Known Allergies  Family History  Problem Relation Age of Onset   Kidney disease Mother      Prior to Admission medications   Medication Sig Start Date End Date Taking? Authorizing Provider  doxycycline (VIBRAMYCIN) 100 MG capsule Take 1 capsule (100 mg total) by mouth 2 (two) times daily for 7 days. 12/21/22 12/28/22 Yes Trifan, Kermit Balo, MD  AbobotulinumtoxinA (DYSPORT) 500 units SOLR injection Inject into the muscles every 3 months. 05/09/22   Levert Feinstein, MD  amLODipine (NORVASC) 10 MG tablet Take 1 tablet by mouth daily. 07/22/21  [provider]  apixaban (ELIQUIS) 5 MG TABS tablet Take 5 mg by mouth 2 (two) times daily.    [provider]  atorvastatin (LIPITOR) 40 MG tablet TAKE ONE TABLET BY MOUTH AT BEDTIME FOR CHOLESTEROL FOR CHOLESTEROL 07/14/21   [provider]  baclofen (LIORESAL) 10 MG tablet Take 10 mg by mouth 3 (three) times daily.    [provider]  carvedilol (COREG) 25 MG tablet Take 12.5 mg by mouth 2 (two) times daily. 06/20/22   [provider]  Cholecalciferol 50 MCG (2000 UT) TABS Take 1 tablet by mouth daily. 10/18/20   [provider]  cyanocobalamin (VITAMIN B12) 500 MCG tablet Take 1,000 mcg by mouth daily. 11/09/22   [provider]  hydrALAZINE (APRESOLINE) 25 MG tablet Take 25 mg by mouth 3 (three) times daily.    [provider]  Lancets MISC 1 application by Does not apply route daily. 05/29/11   [name], MD  naloxone [facility]) nasal spray 4 mg/0.1 mL Place 1 spray into the nose See admin instructions. "CALL 911 IMMEDIATELY, ADMINISTER DOSE, THEN TURN PERSON ON SIDE - IF NO RESPONSE IN 2-3 MINUTES OR PERSON RESPONDS BUT RELAPSES, REPEAT USING A NEW SPRAY DEVICE AND SPRAY INTO THE OTHER NOSTRIL FOR OPIOID REVERSAL" 07/17/22   [provider]  omeprazole (PRILOSEC) 20 MG capsule Take 20 mg by mouth daily.    [provider]  potassium chloride (KLOR-CON) 10 MEQ tablet Take 10 mEq by mouth 2 (two) times daily. 10/13/20   [provider]  potassium chloride SA (KLOR-CON M) 20 MEQ tablet Take 20 mEq by mouth daily. 09/19/22   [provider]  senna (SENOKOT) 8.6 MG tablet Take 1 tablet by mouth 2 (two) times daily. 09/23/20   [provider]  sertraline (ZOLOFT) 100 MG tablet Take 1 tablet by mouth daily. 01/26/21   [provider]  sertraline (ZOLOFT) 100 MG tablet Take 50 mg by mouth daily. 01/26/22   [provider]  traMADol (ULTRAM) 50 MG tablet Take 50 mg by mouth. 11/14/22   [provider]    Physical Exam: BP 112/75   Pulse (!) 149   Temp 98.7 F (37.1 C) (Oral)   Resp (!) 32   Ht 5\' 11"  (1.803 m)   Wt 97.5 kg   SpO2 99%   BMI 29.99 kg/m   General:  Alert, oriented, calm, in no acute distress  Eyes: EOMI, clear conjuctivae, white sclerea Neck: supple, no masses, trachea mildline  Cardiovascular:  Heart is tachycardic and regular, no murmurs or rubs, no peripheral edema  Respiratory: clear to auscultation bilaterally, no wheezes, no crackles  Abdomen: soft, nontender, nondistended, normal bowel tones heard  Skin: dry, no rashes  Musculoskeletal: no joint effusions, normal range of motion  Psychiatric: appropriate affect, normal speech  Neurologic: extraocular muscles intact, clear speech, significantly reduced right-sided movement consistent with hemiplegia         Labs on Admission:  Basic Metabolic Panel: Recent Labs  Lab 12/20/22 1718  NA 139  K 3.2*  CL 105  CO2 24  GLUCOSE 122*  BUN 18  CREATININE 0.90  CALCIUM 8.6*  MG 2.0   Liver Function Tests: No results for input(s): "AST", "ALT", "ALKPHOS", "BILITOT", "PROT", "ALBUMIN" in the last 168 hours. No results for input(s): "LIPASE", "AMYLASE" in the last 168 hours. No results for input(s): "AMMONIA" in the last 168 hours. CBC: Recent Labs  Lab 12/20/22 1718  WBC 7.7  HGB 13.8  HCT 42.7  MCV 96.6  PLT 236   Cardiac Enzymes: No results for input(s): "CKTOTAL", "CKMB", "CKMBINDEX", "TROPONINI" in the last 168 hours.  BNP (last 3 results) No results for input(s): "BNP" in the last 8760 hours.  ProBNP (last 3 results) No results for input(s): "PROBNP" in the last 8760 hours.  CBG: No results for input(s): "GLUCAP" in the last 168 hours.  Radiological Exams on Admission: CT Angio Chest PE W and/or Wo Contrast  Result Date: 12/20/2022 CLINICAL DATA:  High probability for PE, tachycardia. EXAM: CT ANGIOGRAPHY CHEST WITH CONTRAST TECHNIQUE: Multidetector CT imaging of the chest was performed using the standard protocol during bolus administration of intravenous contrast. Multiplanar CT image reconstructions and MIPs were obtained to evaluate the vascular anatomy. RADIATION DOSE REDUCTION: This exam was performed according to the departmental dose-optimization program which includes automated exposure control,  adjustment of the mA and/or kV according to patient size and/or use of iterative reconstruction technique. CONTRAST:  80mL OMNIPAQUE IOHEXOL 350 MG/ML SOLN COMPARISON:  None Available. FINDINGS: Cardiovascular: Satisfactory opacification of the pulmonary arteries to the segmental level. No evidence of pulmonary embolism. Normal heart size. No pericardial effusion. Mediastinum/Nodes: No enlarged mediastinal, hilar, or axillary lymph nodes. Thyroid gland, trachea, and esophagus demonstrate no significant findings. Lungs/Pleura: There is mild elevation of the right hemidiaphragm. There is a band of atelectasis/airspace disease in the inferior right upper lobe abutting the minor fissure. There is also small amount of airspace disease in the central right lower lobe near the hilum with atelectasis in the bilateral lung bases, right greater than left. There is no pleural effusion or pneumothorax. Upper Abdomen: Subcentimeter renal cysts are present. No acute abnormality. Musculoskeletal: Degenerative changes affect the spine. Review of the MIP images confirms the above findings. IMPRESSION: 1. No evidence for pulmonary embolism. 2. Mild elevation of the right hemidiaphragm. 3. Right upper lobe and right lower lobe airspace disease worrisome for pneumonia. Recommend follow-up imaging to resolution. Recommend follow-up PA and lateral chest x-ray in 4-6 weeks to confirm resolution. 4. Bilateral subcentimeter renal cysts. No follow-up imaging recommended. Electronically Signed   By: Darliss Cheney M.D.   On: 12/20/2022 20:10    Assessment/Plan Trevor Anderson is a 67 y.o. 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   Community-acquired pneumonia-seen on CT imaging, may well be the source of his persistent sinus tachycardia and hypotension on arrival.   Patient has been asymptomatic. -Inpatient admission -Empiric IV azithromycin and IV Rocephin -Follow-up procalcitonin  Sinus tachycardia-while differential is broad, acute PE, electrolyte abnormality, MI have been ruled out.  May simply be due to volume depletion, in the setting of community-acquired pneumonia.  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 -Continuous telemetry -Treat acute infection as above -Check respiratory viral panel -Will bolus another 1 L of IV fluid and placed on gentle IV fluids  History of atrial fibrillation-continue Coreg at reduced dose of 6.25 mg p.o. 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 3 times daily -Outpatient neurology follow-up  GERD-Protonix  Depression-Zoloft  Hypokalemia-repleted, continue daily potassium supplementation, recheck in the morning  Hyperlipidemia-Lipitor  Hypertension-hold antihypertensives, blood pressure gradually improving after fluids  DVT prophylaxis: Eliquis  Code Status: Full Code  Consults called: None  Admission status: The appropriate patient status for this patient is INPATIENT. Inpatient status is judged to be reasonable and necessary in order to provide the required intensity of service to ensure the patient's safety. The patient's presenting symptoms, physical exam findings, and initial radiographic and laboratory data in the context of their chronic comorbidities is felt to place them at high risk for further clinical deterioration. Furthermore, it is not anticipated that the patient will be medically stable for discharge from the hospital within 2 midnights of admission.    I certify that at the point of admission it is my clinical judgment that the patient will require inpatient hospital care spanning beyond 2 midnights from the  point of admission due to high intensity of service, high risk for further deterioration and high frequency of surveillance required  Time spent: 53 minutes  Treniyah Lynn Sharlette Dense MD [facility] Pager [phone]  If 7PM-7AM, please contact night-coverage [email or web address] Password [email or web address]  12/21/2022, 7:30 AM

## 2022-12-21 NOTE — ED Notes (Addendum)
Dr. Renaye Rakers notified of elevated HR in the 140s at 2316. 12.5 mg PO Coreg ordered by MD. This RN checked blood pressure at 2324 and was 81/55. Repeat bp was 88/70. Dr. Renaye Rakers notified and coreg dose held. Dr. 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 Dose was reduced to 3.125 mg coreg. Dose was modified on previous coreg ordered and put in for 0800. This RN clarified with Dr. Renaye Rakers when he wanted the coreg dose given, and received verbal confirmation that the dose should be given at 2345. Coreg administered per MAR. [facility] arrived at 0030 to transport patient home. HR was still 150s and bp was 95/82 at this time. MAP has remained above 65. Dr. Nicanor Alcon and Sharilyn Sites PA-C notified of vital signs and plan to cancel discharge and admit patient to hospital. [facility] notified.

## 2022-12-21 NOTE — ED Notes (Signed)
ED TO INPATIENT HANDOFF REPORT  ED Nurse Name and Phone #: Richarda Osmond Name/Age/Gender Trevor Anderson 67 y.o. male Room/Bed: WA02/WA02  Code Status   Code Status: Full Code  Home/SNF/Other Home Patient oriented to: self, place, time, and situation Is this baseline? Yes   Triage Complete: Triage complete  Chief Complaint CAP (community acquired pneumonia) [J18.9]  Triage Note Pt BIBA from home. C/o tachycardia 115-140. Pt states they are normally elevated. Pt has no other complaints at this time  AOx4    Allergies No Known Allergies  Level of Care/Admitting Diagnosis ED Disposition     ED Disposition  Admit   Condition  --   Comment  Hospital Area: [facility] [[id number]]  Level of Care: Telemetry [5]  Admit to tele based on following criteria: Monitor QTC interval  May admit patient to [facility] or [facility] if equivalent level of care is available:: Yes  Covid Evaluation: Asymptomatic - no recent exposure (last 10 days) testing not required  Diagnosis: CAP (community acquired pneumonia) [865784]  Admitting Physician: Trevor Anderson [[id number]]  Attending Physician: Kirby Crigler, MIR Trevor.Anderson [[id number]]  Certification:: I certify this patient will need inpatient services for at least 2 midnights  Expected Medical Readiness: 12/23/2022          B Medical/Surgery History 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   CONGESTIVE HEART FAILURE 11/12/2007   DIABETES MELLITUS, TYPE II 11/20/2008   HYPERLIPIDEMIA 11/12/2007   HYPERTENSION 11/12/2007   TACHYCARDIA 01/24/2010   Past Surgical History:  Procedure Laterality Date   BUBBLE STUDY  03/16/2020   Procedure: BUBBLE STUDY;  Surgeon: Trevor Red, MD;  Location: [facility] ENDOSCOPY;  Service: Cardiovascular;;   LOOP RECORDER INSERTION N/A 03/23/2020   Procedure: LOOP RECORDER INSERTION;  Surgeon: Trevor Range, MD;  Location: [facility] INVASIVE CV LAB;  Service:  Cardiovascular;  Laterality: N/A;   TEE WITHOUT CARDIOVERSION N/A 03/16/2020   Procedure: TRANSESOPHAGEAL ECHOCARDIOGRAM (TEE);  Surgeon: Trevor Red, MD;  Location: [facility] ENDOSCOPY;  Service: Cardiovascular;  Laterality: N/A;   TONSILLECTOMY  1970     A IV Location/Drains/Wounds Patient Lines/Drains/Airways Status     Active Line/Drains/Airways     Name Placement date Placement time Site Days   Peripheral IV 12/20/22 18 G Anterior;Left Forearm 12/20/22  1724  Forearm  1            Intake/Output Last 24 hours  Intake/Output Summary (Last 24 hours) at 12/21/2022 1407 Last data filed at 12/21/2022 0154 Gross per 24 hour  Intake 1000 ml  Output --  Net 1000 ml    Labs/Imaging Results for orders placed or performed during the hospital encounter of 12/20/22 (from the past 48 hour(s))  CBC     Status: None   Collection Time: 12/20/22  5:18 PM  Result Value Ref Anderson   WBC 7.7 4.0 - 10.5 K/uL   RBC 4.42 4.22 - 5.81 MIL/uL   Hemoglobin 13.8 13.0 - 17.0 g/dL   HCT 01.0 27.2 - 53.6 %   MCV 96.6 80.0 - 100.0 fL   MCH 31.2 26.0 - 34.0 pg   MCHC 32.3 30.0 - 36.0 g/dL   RDW 64.4 03.4 - 74.2 %   Platelets 236 150 - 400 K/uL   nRBC 0.0 0.0 - 0.2 %    Comment: Performed at [facility], [address] [place], [place] [address]  Basic metabolic panel     Status: Abnormal   Collection  Time: 12/20/22  5:18 PM  Result Value Ref Anderson   Sodium 139 135 - 145 mmol/L   Potassium 3.2 (L) 3.5 - 5.1 mmol/L   Chloride 105 98 - 111 mmol/L   CO2 24 22 - 32 mmol/L   Glucose, Bld 122 (H) 70 - 99 mg/dL    Comment: Glucose reference Anderson applies only to samples taken after fasting for at least 8 hours.   BUN 18 8 - 23 mg/dL   Creatinine, Ser 1.47 0.61 - 1.24 mg/dL   Calcium 8.6 (L) 8.9 - 10.3 mg/dL   GFR, Estimated >82 >95 mL/min    Comment: (NOTE) Calculated using the CKD-EPI Creatinine Equation (2021)    Anion gap 10 5 - 15    Comment: Performed at [facility], [address], [place], [place] [address]  Magnesium     Status: None   Collection Time: 12/20/22  5:18 PM  Result Value Ref Anderson   Magnesium 2.0 1.7 - 2.4 mg/dL    Comment: Performed at [facility], [address] [place], [place] [address]  Urinalysis, Routine w reflex microscopic -Urine, Clean Catch     Status: Abnormal   Collection Time: 12/20/22  6:44 PM  Result Value Ref Anderson   Color, Urine YELLOW YELLOW   APPearance CLEAR CLEAR   Specific Gravity, Urine 1.020 1.005 - 1.030   pH 5.0 5.0 - 8.0   Glucose, UA NEGATIVE NEGATIVE mg/dL   Hgb urine dipstick NEGATIVE NEGATIVE   Bilirubin Urine NEGATIVE NEGATIVE   Ketones, ur NEGATIVE NEGATIVE mg/dL   Protein, ur 469 (A) NEGATIVE mg/dL   Nitrite NEGATIVE NEGATIVE   Leukocytes,Ua NEGATIVE NEGATIVE   RBC / HPF 0-5 0 - 5 RBC/hpf   WBC, UA 0-5 0 - 5 WBC/hpf   Bacteria, UA NONE SEEN NONE SEEN   Squamous Epithelial / HPF 0-5 0 - 5 /HPF   Mucus PRESENT     Comment: Performed at [facility], [address], [place], [place] [address]  Lactic acid, plasma     Status: None   Collection Time: 12/21/22 12:45 AM  Result Value Ref Anderson   Lactic Acid, Venous 1.8 0.5 - 1.9 mmol/L    Comment: Performed at [facility], [address], [place], [place] [address]  Blood culture (routine x 2)     Status: None (Preliminary result)   Collection Time: 12/21/22 12:45 AM   Specimen: BLOOD  Result Value Ref Anderson   Specimen Description      BLOOD LEFT ANTECUBITAL Performed at [facility], [address] [place], [place] [address]    Special Requests      BOTTLES DRAWN AEROBIC AND ANAEROBIC Blood Culture adequate volume Performed at [facility], [address] [place], [place] [address]    Culture      NO GROWTH < 12 HOURS Performed at [facility], [address] [place], [place] [address]    Report Status  PENDING   Blood culture (routine x 2)     Status: None (Preliminary result)   Collection Time: 12/21/22 12:57 AM   Specimen: BLOOD LEFT ARM  Result Value Ref Anderson   Specimen Description      BLOOD LEFT ARM Performed at [facility], [address], [place], [place] [address]    Special Requests      BOTTLES DRAWN AEROBIC AND ANAEROBIC Blood Culture adequate volume Performed at [facility], [address], [place], [place] [address]    Culture      NO GROWTH < 12 HOURS Performed at [facility], [address] [place], [place] [address]    Report Status PENDING   Procalcitonin     Status: None   Collection Time: 12/21/22  7:00 AM  Result Value Ref Anderson   Procalcitonin <0.10 ng/mL    Comment:        Interpretation: PCT (Procalcitonin) <= 0.5 ng/mL: Systemic infection (sepsis) is not likely. Local bacterial infection is possible. (NOTE)       Sepsis PCT Algorithm           Lower Respiratory Tract                                      Infection PCT Algorithm    ----------------------------     ----------------------------         PCT < 0.25 ng/mL                PCT < 0.10 ng/mL          Strongly encourage             Strongly discourage   discontinuation of antibiotics    initiation of antibiotics    ----------------------------     -----------------------------       PCT 0.25 - 0.50 ng/mL            PCT 0.10 - 0.25 ng/mL               OR       >80% decrease in PCT            Discourage initiation of                                            antibiotics      Encourage discontinuation           of antibiotics    ----------------------------     -----------------------------         PCT >= 0.50 ng/mL              PCT 0.26 - 0.50 ng/mL               AND        <80% decrease in PCT             Encourage initiation of                                             antibiotics       Encourage continuation           of antibiotics     ----------------------------     -----------------------------        PCT >= 0.50 ng/mL                  PCT > 0.50 ng/mL               AND         increase in PCT  Strongly encourage                                      initiation of antibiotics    Strongly encourage escalation           of antibiotics                                     -----------------------------                                           PCT <= 0.25 ng/mL                                                 OR                                        > 80% decrease in PCT                                      Discontinue / Do not initiate                                             antibiotics  Performed at [facility], [address], [place], [place] [address]   Respiratory (~20 pathogens) panel by PCR     Status: None   Collection Time: 12/21/22  9:44 AM   Specimen: Nasopharyngeal Swab; Respiratory  Result Value Ref Anderson   Adenovirus NOT DETECTED NOT DETECTED   Coronavirus 229E NOT DETECTED NOT DETECTED    Comment: (NOTE) The Coronavirus on the Respiratory Panel, DOES NOT test for the novel  Coronavirus (2019 nCoV)    Coronavirus HKU1 NOT DETECTED NOT DETECTED   Coronavirus NL63 NOT DETECTED NOT DETECTED   Coronavirus OC43 NOT DETECTED NOT DETECTED   Metapneumovirus NOT DETECTED NOT DETECTED   Rhinovirus / Enterovirus NOT DETECTED NOT DETECTED   Influenza A NOT DETECTED NOT DETECTED   Influenza B NOT DETECTED NOT DETECTED   Parainfluenza Virus 1 NOT DETECTED NOT DETECTED   Parainfluenza Virus 2 NOT DETECTED NOT DETECTED   Parainfluenza Virus 3 NOT DETECTED NOT DETECTED   Parainfluenza Virus 4 NOT DETECTED NOT DETECTED   Respiratory Syncytial Virus NOT DETECTED NOT DETECTED   Bordetella pertussis NOT DETECTED NOT DETECTED   Bordetella Parapertussis NOT DETECTED NOT DETECTED   Chlamydophila pneumoniae NOT DETECTED NOT DETECTED   Mycoplasma pneumoniae NOT DETECTED NOT  DETECTED    Comment: Performed at [facility], [address], [place], [place] [address]   CT Angio Chest PE W and/or Wo Contrast  Result Date: 12/20/2022 CLINICAL DATA:  High probability for PE, tachycardia. EXAM: CT ANGIOGRAPHY CHEST WITH CONTRAST TECHNIQUE: Multidetector CT imaging of the chest was performed using the standard protocol during  bolus administration of intravenous contrast. Multiplanar CT image reconstructions and MIPs were obtained to evaluate the vascular anatomy. RADIATION DOSE REDUCTION: This exam was performed according to the departmental dose-optimization program which includes automated exposure control, adjustment of the mA and/or kV according to patient size and/or use of iterative reconstruction technique. CONTRAST:  80mL OMNIPAQUE IOHEXOL 350 MG/ML SOLN COMPARISON:  None Available. FINDINGS: Cardiovascular: Satisfactory opacification of the pulmonary arteries to the segmental level. No evidence of pulmonary embolism. Normal heart size. No pericardial effusion. Mediastinum/Nodes: No enlarged mediastinal, hilar, or axillary lymph nodes. Thyroid gland, trachea, and esophagus demonstrate no significant findings. Lungs/Pleura: There is mild elevation of the right hemidiaphragm. There is a band of atelectasis/airspace disease in the inferior right upper lobe abutting the minor fissure. There is also small amount of airspace disease in the central right lower lobe near the hilum with atelectasis in the bilateral lung bases, right greater than left. There is no pleural effusion or pneumothorax. Upper Abdomen: Subcentimeter renal cysts are present. No acute abnormality. Musculoskeletal: Degenerative changes affect the spine. Review of the MIP images confirms the above findings. IMPRESSION: 1. No evidence for pulmonary embolism. 2. Mild elevation of the right hemidiaphragm. 3. Right upper lobe and right lower lobe airspace disease worrisome for pneumonia. Recommend follow-up imaging  to resolution. Recommend follow-up PA and lateral chest x-ray in 4-6 weeks to confirm resolution. 4. Bilateral subcentimeter renal cysts. No follow-up imaging recommended. Electronically Signed   By: Darliss Cheney M.D.   On: 12/20/2022 20:10    Pending Labs Unresulted Labs (From admission, onward)     Start     Ordered   12/22/22 0500  Basic metabolic panel  Tomorrow morning,   R        12/21/22 0730   12/22/22 0500  CBC  Tomorrow morning,   R        12/21/22 0730   12/21/22 0730  HIV Antibody (routine testing w rflx)  (HIV Antibody (Routine testing w reflex) panel)  Once,   R        12/21/22 0730            Vitals/Pain Today's Vitals   12/21/22 0630 12/21/22 0645 12/21/22 0856 12/21/22 0926  BP: 112/75   (!) 105/90  Pulse: (!) 151 (!) 149  (!) 145  Resp: (!) 33 (!) 32  (!) 37  Temp:   97.8 F (36.6 C)   TempSrc:   Oral   SpO2: 96% 99%  100%  Weight:      Height:      PainSc:        Isolation Precautions Droplet precaution  Medications Medications  cefTRIAXone (ROCEPHIN) 1 g in sodium chloride 0.9 % 100 mL IVPB (has no administration in time Anderson)  azithromycin (ZITHROMAX) 500 mg in sodium chloride 0.9 % 250 mL IVPB (0 mg Intravenous Stopped 12/21/22 1310)  atorvastatin (LIPITOR) tablet 80 mg (has no administration in time Anderson)  sertraline (ZOLOFT) tablet 100 mg (100 mg Oral Given 12/21/22 1318)  pantoprazole (PROTONIX) EC tablet 40 mg (40 mg Oral Given 12/21/22 1318)  apixaban (ELIQUIS) tablet 5 mg (5 mg Oral Given 12/21/22 1318)  baclofen (LIORESAL) tablet 10 mg (has no administration in time Anderson)  potassium chloride SA (KLOR-CON M) CR tablet 20 mEq (has no administration in time Anderson)  acetaminophen (TYLENOL) tablet 650 mg (has no administration in time Anderson)    Or  acetaminophen (TYLENOL) suppository 650 mg (has no administration in time Anderson)  traZODone (  DESYREL) tablet 25 mg (has no administration in time Anderson)  ondansetron (ZOFRAN) tablet 4 mg (has no  administration in time Anderson)    Or  ondansetron (ZOFRAN) injection 4 mg (has no administration in time Anderson)  albuterol (PROVENTIL) (2.5 MG/3ML) 0.083% nebulizer solution 2.5 mg (has no administration in time Anderson)  carvedilol (COREG) tablet 6.25 mg (6.25 mg Oral Given 12/21/22 1318)  0.9 %  sodium chloride infusion ( Intravenous New Bag/Given 12/21/22 1317)  hydrALAZINE (APRESOLINE) tablet 25 mg (25 mg Oral Given 12/20/22 1808)  iohexol (OMNIPAQUE) 350 MG/ML injection 80 mL (80 mLs Intravenous Contrast Given 12/20/22 1936)  cefTRIAXone (ROCEPHIN) 1 g in sodium chloride 0.9 % 100 mL IVPB (0 g Intravenous Stopped 12/20/22 2122)  doxycycline (VIBRA-TABS) tablet 100 mg (100 mg Oral Given 12/20/22 2040)  sodium chloride 0.9 % bolus 1,000 mL (0 mLs Intravenous Stopped 12/21/22 0154)  potassium chloride SA (KLOR-CON M) CR tablet 40 mEq (40 mEq Oral Given 12/21/22 0349)  sodium chloride 0.9 % bolus 1,000 mL (0 mLs Intravenous Stopped 12/21/22 1310)    Mobility walks with device     Focused Assessments Cardiac Assessment Handoff:  Cardiac Rhythm: Normal sinus rhythm Lab Results  Component Value Date   CKTOTAL 379 (H) 08/22/2007   CKMB 2.8 08/22/2007   TROPONINI  08/22/2007    0.06        PERSISTENTLY INCREASED TROPONIN VALUES IN THE Anderson OF 0.06-0.49 ng/mL CAN BE SEEN IN:       -UNSTABLE ANGINA   Lab Results  Component Value Date   DDIMER 1.39 (H) 03/12/2020   Does the Patient currently have chest pain? No    R Recommendations: See Admitting Provider Note  Report given to:   Additional Notes:

## 2022-12-21 NOTE — ED Notes (Signed)
Patient's son Maywood called and updated on plan for admission.

## 2022-12-22 ENCOUNTER — Encounter (HOSPITAL_COMMUNITY): Payer: Self-pay | Admitting: Internal Medicine

## 2022-12-22 DIAGNOSIS — K219 Gastro-esophageal reflux disease without esophagitis: Secondary | ICD-10-CM | POA: Insufficient documentation

## 2022-12-22 DIAGNOSIS — J189 Pneumonia, unspecified organism: Secondary | ICD-10-CM | POA: Diagnosis not present

## 2022-12-22 DIAGNOSIS — I480 Paroxysmal atrial fibrillation: Secondary | ICD-10-CM

## 2022-12-22 LAB — BASIC METABOLIC PANEL
Anion gap: 7 (ref 5–15)
BUN: 12 mg/dL (ref 8–23)
CO2: 24 mmol/L (ref 22–32)
Calcium: 8.5 mg/dL — ABNORMAL LOW (ref 8.9–10.3)
Chloride: 108 mmol/L (ref 98–111)
Creatinine, Ser: 0.88 mg/dL (ref 0.61–1.24)
GFR, Estimated: >60 mL/min (ref >60)
Glucose, Bld: 107 mg/dL — ABNORMAL HIGH (ref 70–99)
Potassium: 3.4 mmol/L — ABNORMAL LOW (ref 3.5–5.1)
Sodium: 139 mmol/L (ref 135–145)

## 2022-12-22 LAB — CBC
HCT: 39.3 % (ref 39.0–52.0)
Hemoglobin: 12.4 g/dL — ABNORMAL LOW (ref 13.0–17.0)
MCH: 30.8 pg (ref 26.0–34.0)
MCHC: 31.6 g/dL (ref 30.0–36.0)
MCV: 97.8 fL (ref 80.0–100.0)
Platelets: 209 10*3/uL (ref 150–400)
RBC: 4.02 MIL/uL — ABNORMAL LOW (ref 4.22–5.81)
RDW: 14 % (ref 11.5–15.5)
WBC: 7 10*3/uL (ref 4.0–10.5)
nRBC: 0 % (ref 0.0–0.2)

## 2022-12-22 LAB — HEMOGLOBIN A1C
Hgb A1c MFr Bld: 6 % — ABNORMAL HIGH (ref 4.8–5.6)
Mean Plasma Glucose: 125.5 mg/dL

## 2022-12-22 LAB — PROCALCITONIN: Procalcitonin: <0.1 ng/mL

## 2022-12-22 NOTE — Progress Notes (Signed)
Progress Note    SHINE ANTAL   NFA:[id number]  DOB: December 10, 1955  DOA: 12/20/2022     1 PCP: [name], MD  Initial CC: tachycardia, ?SOB  Hospital Course: [name] is a 67 yo male with PMH CVA with residual right hemiparesis (mostly bed/WC bound), CHF, DM II, HLD, HTN, A-fib. He presented due to development of A-fib with RVR prior to hospitalization.  He was also found to have hypotension, 82/73.  He was started on fluids on admission and coreg was continued.  There was some concern for SOB in the ER and in setting of tachycardia and hypotension he underwent CTA PE protocol.  This was negative for PE but showed concern for some airspace disease involving the right upper and lower lobe concerning for pneumonia.  He was placed on antibiotics and also admitted for further rate control and monitoring.  Interval History:  Resting in bed when seen this morning.  We discussed potential discharge home but he states he is still not feeling back to his self.  Denies any productive cough at this time.  Given findings on CTA chest, we will treat as pneumonia for now.  Assessment and Plan: * CAP (community acquired pneumonia) - RVP negative and PCT initially negative (will trend) - CTA chest appreciated with right sided infiltrates - will clinically treat as PNA with IV abx thru today then de-escalate to PO abx to complete course tomorrow -Continue Rocephin and azithromycin  Paroxysmal atrial fibrillation with RVR (HCC) - Continue Eliquis - Continue Coreg for now.  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  Is mostly bed and wheelchair bound  Hypokalemia -Replete as needed  Essential hypertension - Continuing Coreg for now for rate control - Hold hydralazine in  setting of hypotension on admission  Mixed hyperlipidemia - Continue Lipitor  DMII (diabetes mellitus, type 2) (HCC) - No further insulin noted on med rec - Update A1c -Glucose levels currently well-controlled.  Okay to hold off on monitoring for now   Old records reviewed in assessment of this patient  Antimicrobials: Azithromycin 12/21/2022 >> current Rocephin 12/20/2022 >> current  DVT prophylaxis:  SCDs Start: 12/21/22 0730 apixaban (ELIQUIS) tablet 5 mg   Code Status:   Code Status: Full Code  Mobility Assessment (Last 72 Hours)     Mobility Assessment     Row Name 12/22/22 1212 12/21/22 2030 12/21/22 1655       Does patient have an order for bedrest or is patient medically unstable No - Continue assessment No - Continue assessment No - Continue assessment     What is the highest level of mobility based on the progressive mobility assessment? Level 2 (Chairfast) - Balance while sitting on edge of bed and cannot stand Level 2 (Chairfast) - Balance while sitting on edge of bed and cannot stand Level 2 (Chairfast) - Balance while sitting on edge of bed and cannot stand     Is the above level different from baseline mobility prior to current illness? No - Consider discontinuing PT/OT No - Consider discontinuing PT/OT No - Consider discontinuing PT/OT              Barriers to discharge: none Disposition Plan:  Home Status is: Inpt   Objective: Blood pressure (!) 162/97, pulse 88, temperature 98.6 F (37 C), temperature  source Oral, resp. rate 18, height 5\' 11"  (1.803 m), weight 97.5 kg, SpO2 99%.  Examination:  Physical Exam Constitutional:      General: He is not in acute distress.    Appearance: Normal appearance.  HENT:     Head: Normocephalic and atraumatic.     Mouth/Throat:     Mouth: Mucous membranes are moist.  Eyes:     Extraocular Movements: Extraocular movements intact.  Cardiovascular:     Rate and Rhythm: Normal rate and regular rhythm.   Pulmonary:     Effort: Pulmonary effort is normal. No respiratory distress.     Breath sounds: Normal breath sounds. No wheezing.  Abdominal:     General: Bowel sounds are normal. There is no distension.     Palpations: Abdomen is soft.     Tenderness: There is no abdominal tenderness.  Musculoskeletal:        General: Normal range of motion.     Cervical back: Normal range of motion and neck supple.  Skin:    General: Skin is warm and dry.  Neurological:     General: No focal deficit present.     Mental Status: He is alert.  Psychiatric:        Mood and Affect: Mood normal.        Behavior: Behavior normal.      Consultants:    Procedures:    Data Reviewed: Results for orders placed or performed during the hospital encounter of 12/20/22 (from the past 24 hour(s))  HIV Antibody (routine testing w rflx)     Status: None   Collection Time: 12/21/22  5:01 PM  Result Value Ref Range   HIV Screen 4th Generation wRfx Non Reactive Non Reactive  Basic metabolic panel     Status: Abnormal   Collection Time: 12/22/22  5:38 AM  Result Value Ref Range   Sodium 139 135 - 145 mmol/L   Potassium 3.4 (L) 3.5 - 5.1 mmol/L   Chloride 108 98 - 111 mmol/L   CO2 24 22 - 32 mmol/L   Glucose, Bld 107 (H) 70 - 99 mg/dL   BUN 12 8 - 23 mg/dL   Creatinine, Ser 1.61 0.61 - 1.24 mg/dL   Calcium 8.5 (L) 8.9 - 10.3 mg/dL   GFR, Estimated >09 >60 mL/min   Anion gap 7 5 - 15  CBC     Status: Abnormal   Collection Time: 12/22/22  5:38 AM  Result Value Ref Range   WBC 7.0 4.0 - 10.5 K/uL   RBC 4.02 (L) 4.22 - 5.81 MIL/uL   Hemoglobin 12.4 (L) 13.0 - 17.0 g/dL   HCT 45.4 09.8 - 11.9 %   MCV 97.8 80.0 - 100.0 fL   MCH 30.8 26.0 - 34.0 pg   MCHC 31.6 30.0 - 36.0 g/dL   RDW 14.7 82.9 - 56.2 %   Platelets 209 150 - 400 K/uL   nRBC 0.0 0.0 - 0.2 %    I have reviewed pertinent nursing notes, vitals, labs, and images as necessary. I have ordered labwork to follow up on as indicated.  I have  reviewed the last notes from staff over past 24 hours. I have discussed patient's care plan and test results with nursing staff, CM/SW, and other staff as appropriate.  Time spent: Greater than 50% of the 55 minute visit was spent in counseling/coordination of care for the patient as laid out in the A&P.   LOS: 1 day   Lewie Chamber,  MD [facility] 12/22/2022, 2:23 PM

## 2022-12-22 NOTE — Assessment & Plan Note (Signed)
-   Residual right hemiparesis.  Is mostly bed and wheelchair bound

## 2022-12-22 NOTE — Assessment & Plan Note (Signed)
-  Continue Lipitor °

## 2022-12-22 NOTE — Assessment & Plan Note (Signed)
-   RVP negative and PCT initially negative - CTA chest appreciated with right sided infiltrates -Treated with IV antibiotics during hospitalization and transitioned to oral regimen to complete at discharge

## 2022-12-22 NOTE — Assessment & Plan Note (Signed)
Continue Zoloft 

## 2022-12-22 NOTE — Assessment & Plan Note (Signed)
-   Replete as needed 

## 2022-12-22 NOTE — Assessment & Plan Note (Signed)
-   Continue Eliquis - Continue Coreg for now.  If further hypotension, may need to transition to metoprolol

## 2022-12-22 NOTE — Plan of Care (Signed)

## 2022-12-22 NOTE — Assessment & Plan Note (Signed)
-   No further insulin noted on med rec - Update A1c -Glucose levels currently well-controlled.  Okay to hold off on monitoring for now

## 2022-12-22 NOTE — Hospital Course (Signed)
Trevor Anderson is a 67 yo male with PMH CVA with residual right hemiparesis (mostly bed/WC bound), CHF, DM II, HLD, HTN, A-fib. He presented due to development of A-fib with RVR prior to hospitalization.  He was also found to have hypotension, 82/73.  He was started on fluids on admission and coreg was continued.  There was some concern for SOB in the ER and in setting of tachycardia and hypotension he underwent CTA PE protocol.  This was negative for PE but showed concern for some airspace disease involving the right upper and lower lobe concerning for pneumonia.  He was placed on antibiotics and also admitted for further rate control and monitoring.

## 2022-12-22 NOTE — Assessment & Plan Note (Signed)
Continue Protonix °

## 2022-12-22 NOTE — TOC Initial Note (Signed)
Transition of Care [facility]) - Initial/Assessment Note    Patient Details  Name: Trevor Anderson MRN: [id number] Date of Birth: Sep 21, 1955  Transition of Care [facility]) CM/SW Contact:    Adrian Prows, RN Phone Number: 12/22/2022, 10:48 AM  Clinical Narrative:                 [facility] consult for d/c planning; notified by Morrie Sheldon at [facility] that pt has PT/OT w/ agency; spoke w/ pt in room; he lives w/ his son and plans to return at d./c; pt says he has HAs bilaterally; he also has walker and wheel chair; there are grab bars in his shower; pt would like to con't [facility] services w/ [facility]; TOC will follow.  Expected Discharge Plan: Home w Home Health Services Barriers to Discharge: Continued Medical Work up   Patient Goals and CMS Choice Patient states their goals for this hospitalization and ongoing recovery are:: home          Expected Discharge Plan and Services   Discharge Planning Services: CM Consult Post Acute Care Choice: Resumption of Svcs/PTA Provider (HHPT/OT w/ [facility]) Living arrangements for the past 2 months: Single Family Home                                      Prior Living Arrangements/Services Living arrangements for the past 2 months: Single Family Home Lives with:: Adult Children Patient language and need for interpreter reviewed:: Yes Do you feel safe going back to the place where you live?: Yes      Need for Family Participation in Patient Care: Yes (Comment) Care giver support system in place?: Yes (comment) Current home services: DME, Home OT, Home PT (walker, wheelchair; HHPT/OT w/ [facility]) Criminal Activity/Legal Involvement Pertinent to Current Situation/Hospitalization: No - Comment as needed  Activities of Daily Living Home Assistive Devices/Equipment: Grab bars in shower, Wheelchair ADL Screening (condition at time of admission) Patient's cognitive ability adequate to safely complete daily activities?: Yes Is the patient deaf or have  difficulty hearing?: Yes Does the patient have difficulty seeing, even when wearing glasses/contacts?: No Does the patient have difficulty concentrating, remembering, or making decisions?: No Patient able to express need for assistance with ADLs?: Yes Does the patient have difficulty dressing or bathing?: No Independently performs ADLs?: No Communication: Independent Dressing (OT): Needs assistance Is this a change from baseline?: Pre-admission baseline Grooming: Needs assistance Is this a change from baseline?: Pre-admission baseline Feeding: Independent Bathing: Needs assistance Is this a change from baseline?: Pre-admission baseline Toileting: Needs assistance Is this a change from baseline?: Pre-admission baseline In/Out Bed: Needs assistance Is this a change from baseline?: Pre-admission baseline Walks in Home: Needs assistance Is this a change from baseline?: Pre-admission baseline Does the patient have difficulty walking or climbing stairs?: Yes Weakness of Legs: Right Weakness of Arms/Hands: Right  Permission Sought/Granted Permission sought to share information with : Case Manager Permission granted to share information with : Yes, Verbal Permission Granted  Share Information with NAME: Case Manager     Permission granted to share info w Relationship: Clanton, Cureton (son) [phone]     Emotional Assessment Appearance:: Appears stated age Attitude/Demeanor/Rapport: Gracious Affect (typically observed): Accepting Orientation: : Oriented to Self, Oriented to Place, Oriented to  Time, Oriented to Situation Alcohol / Substance Use: Not Applicable Psych Involvement: No (comment)  Admission diagnosis:  Tachycardia [R00.0] CAP (community acquired pneumonia) [J18.9] Pneumonia  of right upper lobe due to infectious organism [J18.9] Patient Active Problem List   Diagnosis Date Noted   CAP (community acquired pneumonia) 12/21/2022   Spastic hemiplegia affecting right  nondominant side (HCC) 09/07/2021   Depressive disorder 04/07/2021   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   Impacted cerumen, bilateral 04/07/2021   Unspecified hearing loss, bilateral 04/07/2021   Hemiparesis as late effect of cerebrovascular accident (CVA) (HCC) 04/02/2020   Cerebrovascular accident (CVA) (HCC)    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   Hypertensive urgency 03/12/2020   Acute on chronic systolic CHF (congestive heart failure) (HCC) 03/12/2020   AKI (acute kidney injury) (HCC) 03/12/2020   Fall at home, initial encounter 03/12/2020   Right hip pain 03/12/2020   Hypokalemia 03/12/2020   Coronary artery disease involving native coronary artery of native heart without angina pectoris 09/07/2014   Renal insufficiency 12/02/2012   Preventative health care 11/13/2010   Secondary cardiomyopathy (HCC) 01/24/2010   TACHYCARDIA 01/24/2010   Type 2 diabetes mellitus without complication, with long-term current use of insulin (HCC) 11/20/2008   Mixed hyperlipidemia 11/12/2007   Essential hypertension 11/12/2007   CONGESTIVE HEART FAILURE 11/12/2007   PCP:  [name], MD Pharmacy:   [facility] - [place], [place] - [address] [place] [address] Phone: [phone] Fax: [phone]  [id number] [place], [place] - [address] AT [facility] OF [facility] & [address] [place] [address] Phone: [phone] Fax: [phone]     Social Determinants of Health (SDOH) Social History: SDOH Screenings   Food Insecurity: No Food Insecurity (12/22/2022)  Housing: Low Risk  (12/22/2022)  Transportation Needs: No Transportation Needs (12/22/2022)  Utilities: Not At Risk (12/22/2022)  Tobacco Use: Low Risk  (07/18/2022)   SDOH Interventions: Food Insecurity Interventions: Intervention Not Indicated, Inpatient TOC Housing  Interventions: Intervention Not Indicated, Inpatient TOC Transportation Interventions: Intervention Not Indicated, Inpatient TOC Utilities Interventions: Intervention Not Indicated, Inpatient TOC   Readmission Risk Interventions     No data to display

## 2022-12-22 NOTE — Assessment & Plan Note (Signed)
-   Continue home regimen 

## 2022-12-22 NOTE — Plan of Care (Signed)
  Problem: Education: Goal: Knowledge of General Education information will improve Description: Including pain rating scale, medication(s)/side effects and non-pharmacologic comfort measures Outcome: Not Progressing   Problem: Health Behavior/Discharge Planning: Goal: Ability to manage health-related needs will improve Outcome: Not Progressing   Problem: Clinical Measurements: Goal: Ability to maintain clinical measurements within normal limits will improve Outcome: Progressing Goal: Will remain free from infection Outcome: Progressing Goal: Diagnostic test results will improve Outcome: Progressing   Problem: Clinical Measurements: Goal: Will remain free from infection Outcome: Progressing   Problem: Clinical Measurements: Goal: Diagnostic test results will improve Outcome: Progressing

## 2022-12-23 ENCOUNTER — Other Ambulatory Visit (HOSPITAL_COMMUNITY): Payer: Self-pay

## 2022-12-23 DIAGNOSIS — J189 Pneumonia, unspecified organism: Secondary | ICD-10-CM | POA: Diagnosis not present

## 2022-12-23 MED ORDER — AMOXICILLIN 875 MG PO TABS
875.0000 mg | ORAL_TABLET | Freq: Two times a day (BID) | ORAL | 0 refills | Status: AC
  Filled 2022-12-23: qty 6, 3d supply, fill #0

## 2022-12-23 MED ORDER — AZITHROMYCIN 500 MG PO TABS
500.0000 mg | ORAL_TABLET | Freq: Every day | ORAL | 0 refills | Status: AC
  Filled 2022-12-23: qty 3, 3d supply, fill #0

## 2022-12-23 NOTE — TOC Transition Note (Addendum)
Transition of Care [facility]) - CM/SW Discharge Note   Patient Details  Name: Trevor Anderson MRN: [id number] Date of Birth: [birth year]  Transition of Care [facility]) CM/SW Contact:  Adrian Prows, RN Phone Number: 12/23/2022, 1:48 PM   Clinical Narrative:    D/C orders received; spoke w/ pt in room; t says he needs transportation home; he verified d/c address [address] [place] [address]; pt's son Dominick Gueli, II [phone]) notified; he would like to be called when [facility] arrives for pt pickup; Sarah, LPN notified via secure chat; [facility] called at [phone]; spoke w/ operator # [phone]; Morrie Sheldon at [facility] notified of pt d/c; no TOC needs   Final next level of care: Home w Home Health Services Barriers to Discharge: No Barriers Identified   Patient Goals and CMS Choice      Discharge Placement                    Name of family member notified: Jemery Umstead, II (son) [phone] Patient and family notified of of transfer: 12/23/22  Discharge Plan and Services Additional resources added to the After Visit Summary for     Discharge Planning Services: CM Consult Post Acute Care Choice: Resumption of Svcs/PTA Provider (HHPT/OT w/ [facility])                               Social Determinants of Health (SDOH) Interventions SDOH Screenings   Food Insecurity: No Food Insecurity (12/22/2022)  Housing: Low Risk  (12/22/2022)  Transportation Needs: No Transportation Needs (12/22/2022)  Utilities: Not At Risk (12/22/2022)  Tobacco Use: Low Risk  (12/22/2022)     Readmission Risk Interventions     No data to display

## 2022-12-23 NOTE — Discharge Summary (Signed)
Physician Discharge Summary   Trevor Anderson:[id number] DOB: Sep 04, 1955 DOA: 12/20/2022  PCP: [name], MD  Admit date: 12/20/2022 Discharge date: 12/23/2022  Admitted From: Home Disposition:  Home Discharging physician: Lewie Chamber, MD Barriers to discharge: none  Recommendations at discharge: If future hypotension, could consider transitioning Coreg to metoprolol  Discharge Condition: stable CODE STATUS: Full Diet recommendation:  Diet Orders (From admission, onward)     Start     Ordered   12/23/22 0000  Diet general        12/23/22 1256   12/21/22 0730  Diet regular Room service appropriate? Yes; Fluid consistency: Thin  Diet effective now       Question Answer Comment  Room service appropriate? Yes   Fluid consistency: Thin      12/21/22 0730            Hospital Course: Mr. 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 He presented due to development of A-fib with RVR prior to hospitalization.  He was also found to have hypotension, 82/73.  He was started on fluids on admission and coreg was continued.  There was some concern for SOB in the ER and in setting of tachycardia and hypotension he underwent CTA PE protocol.  This was negative for PE but showed concern for some airspace disease involving the right upper and lower lobe concerning for pneumonia.  He was placed on antibiotics and also admitted for further rate control and monitoring.  Assessment and Plan: * CAP (community acquired pneumonia) - RVP negative and PCT initially negative - CTA chest appreciated with right sided infiltrates -Treated with IV antibiotics during hospitalization and transitioned to oral regimen to complete at discharge  Paroxysmal atrial fibrillation with RVR (HCC) - Continue Eliquis - Continue Coreg for now.  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  Is mostly bed and wheelchair bound  Hypokalemia -Replete as needed  Essential hypertension - Continue home regimen  Mixed hyperlipidemia - Continue Lipitor  DMII (diabetes mellitus, type 2) (HCC) - A1c 6% on admission - Continue diet control   The patient's acute and chronic medical conditions were treated accordingly. On day of discharge, patient was felt deemed stable for discharge. Patient/family member advised to call PCP or come back to ER if needed.   Principal Diagnosis: CAP (community acquired pneumonia)  Discharge Diagnoses: Active Hospital Problems   Diagnosis Date Noted   CAP (community acquired pneumonia) 12/21/2022    Priority: 1.   Paroxysmal atrial fibrillation with RVR (HCC) 12/22/2022    Priority: 2.   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     Discharge Instructions     Diet general   Complete by: As directed    Increase activity slowly   Complete by: As directed       Allergies as of 12/23/2022   No Known Allergies      Medication List     TAKE  these medications    amLODipine 10 MG tablet Commonly known as: NORVASC Take 10 mg by mouth daily.   amoxicillin 875 MG tablet Commonly known as: AMOXIL Take 1 tablet (875 mg total) by mouth 2 (two) times daily for 3 days.   apixaban 5 MG Tabs tablet Commonly known as: ELIQUIS Take 5 mg by mouth 2 (two) times daily.   atorvastatin 40 MG tablet Commonly known as: LIPITOR Take 40 mg  by mouth at bedtime.   azithromycin 500 MG tablet Commonly known as: Zithromax Take 1 tablet (500 mg total) by mouth daily for 3 days.   baclofen 10 MG tablet Commonly known as: LIORESAL Take 10 mg by mouth 3 (three) times daily.   carvedilol 25 MG tablet Commonly known as: COREG Take 12.5 mg by mouth 2 (two) times daily.   Cholecalciferol 50 MCG (2000 UT) Tabs Take 2,000 Units by mouth daily.   cyanocobalamin 500 MCG tablet Commonly known as: VITAMIN B12 Take 500 mcg by mouth 2 (two) times daily.   Dysport 500 units Solr injection Generic drug: AbobotulinumtoxinA Inject into the muscles every 3 months.   hydrALAZINE 25 MG tablet Commonly known as: APRESOLINE Take 25 mg by mouth 3 (three) times daily.   Lancets Misc 1 application by Does not apply route daily.   naloxone 4 MG/0.1ML Liqd nasal spray kit Commonly known as: NARCAN Place 1 spray into the nose See admin instructions. "CALL 911 IMMEDIATELY, ADMINISTER DOSE, THEN TURN PERSON ON SIDE - IF NO RESPONSE IN 2-3 MINUTES OR PERSON RESPONDS BUT RELAPSES, REPEAT USING A NEW SPRAY DEVICE AND SPRAY INTO THE OTHER NOSTRIL FOR OPIOID REVERSAL"   omeprazole 20 MG capsule Commonly known as: PRILOSEC Take 20 mg by mouth daily.   potassium chloride SA 20 MEQ tablet Commonly known as: KLOR-CON M Take 20 mEq by mouth at bedtime.   senna 8.6 MG tablet Commonly known as: SENOKOT Take 1 tablet by mouth 2 (two) times daily.   sertraline 100 MG tablet Commonly known as: ZOLOFT Take 50 mg by mouth daily.   traMADol 50 MG tablet Commonly known as: ULTRAM Take 50 mg by mouth every 6 (six) hours as needed for moderate pain.        Follow-up Information     Go to  [facility] Emergency Department at [facility].   Specialty: Emergency Medicine Why: If symptoms worsen Contact information: [address] [place] [address] [phone]        [name], MD. Schedule an appointment  as soon as possible for a visit in 3 days.   Specialties: Internal Medicine, Radiology Contact information: [address] [place] [address] [phone]                No Known Allergies  Consultations:   Procedures:   Discharge Exam: BP (!) 145/94 (BP Location: Left Arm)   Pulse 94   Temp 98.8 F (37.1 C) (Oral)   Resp 17   Ht 5\' 11"  (1.803 m)   Wt 97.5 kg   SpO2 100%   BMI 29.99 kg/m  Physical Exam Constitutional:      General: He is not in acute distress.    Appearance: Normal appearance.  HENT:     Head: Normocephalic and atraumatic.     Mouth/Throat:     Mouth: Mucous membranes are moist.  Eyes:     Extraocular Movements: Extraocular movements intact.  Cardiovascular:     Rate and Rhythm:  Normal rate and regular rhythm.  Pulmonary:     Effort: Pulmonary effort is normal. No respiratory distress.     Breath sounds: Normal breath sounds. No wheezing.  Abdominal:     General: Bowel sounds are normal. There is no distension.     Palpations: Abdomen is soft.     Tenderness: There is no abdominal tenderness.  Musculoskeletal:        General: Normal range of motion.     Cervical back: Normal range of motion and neck supple.  Skin:    General: Skin is warm and dry.  Neurological:     General: No focal deficit present.     Mental Status: He is alert.  Psychiatric:        Mood and Affect: Mood normal.        Behavior: Behavior normal.      The results of significant diagnostics from this hospitalization (including imaging, microbiology, ancillary and laboratory) are listed below for reference.   Microbiology: Recent Results (from the past 240 hour(s))  Blood culture (routine x 2)     Status: None (Preliminary result)   Collection Time: 12/21/22 12:45 AM   Specimen: BLOOD  Result Value Ref Range Status   Specimen Description   Final    BLOOD LEFT ANTECUBITAL Performed at [facility], [address] [place],  [place] [address]    Special Requests   Final    BOTTLES DRAWN AEROBIC AND ANAEROBIC Blood Culture adequate volume Performed at [facility], [address], [place], [place] [address]    Culture   Final    NO GROWTH 2 DAYS Performed at [facility], [address] [place], [place] [address]    Report Status PENDING  Incomplete  Blood culture (routine x 2)     Status: None (Preliminary result)   Collection Time: 12/21/22 12:57 AM   Specimen: BLOOD LEFT ARM  Result Value Ref Range Status   Specimen Description   Final    BLOOD LEFT ARM Performed at [facility], [address], [place], [place] [address]    Special Requests   Final    BOTTLES DRAWN AEROBIC AND ANAEROBIC Blood Culture adequate volume Performed at [facility], [address], [place], [place] [address]    Culture   Final    NO GROWTH 2 DAYS Performed at [facility], [address], [place], [place] [address]    Report Status PENDING  Incomplete  Respiratory (~20 pathogens) panel by PCR     Status: None   Collection Time: 12/21/22  9:44 AM   Specimen: Nasopharyngeal Swab; Respiratory  Result Value Ref Range Status   Adenovirus NOT DETECTED NOT DETECTED Final   Coronavirus 229E NOT DETECTED NOT DETECTED Final    Comment: (NOTE) The Coronavirus on the Respiratory Panel, DOES NOT test for the novel  Coronavirus (2019 nCoV)    Coronavirus HKU1 NOT DETECTED NOT DETECTED Final   Coronavirus NL63 NOT DETECTED NOT DETECTED Final   Coronavirus OC43 NOT DETECTED NOT DETECTED Final   Metapneumovirus NOT DETECTED NOT DETECTED Final   Rhinovirus / Enterovirus NOT DETECTED NOT DETECTED Final   Influenza A NOT DETECTED NOT DETECTED Final   Influenza B NOT DETECTED NOT DETECTED Final   Parainfluenza Virus 1 NOT DETECTED NOT DETECTED Final   Parainfluenza Virus 2 NOT DETECTED NOT DETECTED Final   Parainfluenza Virus 3 NOT DETECTED NOT DETECTED Final   Parainfluenza  Virus 4  NOT DETECTED NOT DETECTED Final   Respiratory Syncytial Virus NOT DETECTED NOT DETECTED Final   Bordetella pertussis NOT DETECTED NOT DETECTED Final   Bordetella Parapertussis NOT DETECTED NOT DETECTED Final   Chlamydophila pneumoniae NOT DETECTED NOT DETECTED Final   Mycoplasma pneumoniae NOT DETECTED NOT DETECTED Final    Comment: Performed at [facility], [address], [place], [place] [address]     Labs: BNP (last 3 results) No results for input(s): "BNP" in the last 8760 hours. Basic Metabolic Panel: Recent Labs  Lab 12/20/22 1718 12/22/22 0538  NA 139 139  K 3.2* 3.4*  CL 105 108  CO2 24 24  GLUCOSE 122* 107*  BUN 18 12  CREATININE 0.90 0.88  CALCIUM 8.6* 8.5*  MG 2.0  --    Liver Function Tests: No results for input(s): "AST", "ALT", "ALKPHOS", "BILITOT", "PROT", "ALBUMIN" in the last 168 hours. No results for input(s): "LIPASE", "AMYLASE" in the last 168 hours. No results for input(s): "AMMONIA" in the last 168 hours. CBC: Recent Labs  Lab 12/20/22 1718 12/22/22 0538  WBC 7.7 7.0  HGB 13.8 12.4*  HCT 42.7 39.3  MCV 96.6 97.8  PLT 236 209   Cardiac Enzymes: No results for input(s): "CKTOTAL", "CKMB", "CKMBINDEX", "TROPONINI" in the last 168 hours. BNP: Invalid input(s): "POCBNP" CBG: No results for input(s): "GLUCAP" in the last 168 hours. D-Dimer No results for input(s): "DDIMER" in the last 72 hours. Hgb A1c Recent Labs    12/22/22 1507  HGBA1C 6.0*   Lipid Profile No results for input(s): "CHOL", "HDL", "LDLCALC", "TRIG", "CHOLHDL", "LDLDIRECT" in the last 72 hours. Thyroid function studies No results for input(s): "TSH", "T4TOTAL", "T3FREE", "THYROIDAB" in the last 72 hours.  Invalid input(s): "FREET3" Anemia work up No results for input(s): "VITAMINB12", "FOLATE", "FERRITIN", "TIBC", "IRON", "RETICCTPCT" in the last 72 hours. Urinalysis    Component Value Date/Time   COLORURINE YELLOW 12/20/2022 1844   APPEARANCEUR CLEAR  12/20/2022 1844   LABSPEC 1.020 12/20/2022 1844   PHURINE 5.0 12/20/2022 1844   GLUCOSEU NEGATIVE 12/20/2022 1844   GLUCOSEU NEGATIVE 05/03/2017 1231   HGBUR NEGATIVE 12/20/2022 1844   BILIRUBINUR NEGATIVE 12/20/2022 1844   KETONESUR NEGATIVE 12/20/2022 1844   PROTEINUR 100 (A) 12/20/2022 1844   UROBILINOGEN 0.2 05/03/2017 1231   NITRITE NEGATIVE 12/20/2022 1844   LEUKOCYTESUR NEGATIVE 12/20/2022 1844   Sepsis Labs Recent Labs  Lab 12/20/22 1718 12/22/22 0538  WBC 7.7 7.0   Microbiology Recent Results (from the past 240 hour(s))  Blood culture (routine x 2)     Status: None (Preliminary result)   Collection Time: 12/21/22 12:45 AM   Specimen: BLOOD  Result Value Ref Range Status   Specimen Description   Final    BLOOD LEFT ANTECUBITAL Performed at [facility], [address], [place], [place] [address]    Special Requests   Final    BOTTLES DRAWN AEROBIC AND ANAEROBIC Blood Culture adequate volume Performed at [facility], [address] [place], [place] [address]    Culture   Final    NO GROWTH 2 DAYS Performed at [facility], [address], [place], [place] [address]    Report Status PENDING  Incomplete  Blood culture (routine x 2)     Status: None (Preliminary result)   Collection Time: 12/21/22 12:57 AM   Specimen: BLOOD LEFT ARM  Result Value Ref Range Status   Specimen Description   Final  BLOOD LEFT ARM Performed at [facility], [address] [place], [place] [address]    Special Requests   Final    BOTTLES DRAWN AEROBIC AND ANAEROBIC Blood Culture adequate volume Performed at [facility], [address], [place], [place] [address]    Culture   Final    NO GROWTH 2 DAYS Performed at [facility], [address] [place], [place] [address]    Report Status PENDING  Incomplete  Respiratory (~20 pathogens) panel by PCR     Status: None   Collection Time: 12/21/22  9:44 AM    Specimen: Nasopharyngeal Swab; Respiratory  Result Value Ref Range Status   Adenovirus NOT DETECTED NOT DETECTED Final   Coronavirus 229E NOT DETECTED NOT DETECTED Final    Comment: (NOTE) The Coronavirus on the Respiratory Panel, DOES NOT test for the novel  Coronavirus (2019 nCoV)    Coronavirus HKU1 NOT DETECTED NOT DETECTED Final   Coronavirus NL63 NOT DETECTED NOT DETECTED Final   Coronavirus OC43 NOT DETECTED NOT DETECTED Final   Metapneumovirus NOT DETECTED NOT DETECTED Final   Rhinovirus / Enterovirus NOT DETECTED NOT DETECTED Final   Influenza A NOT DETECTED NOT DETECTED Final   Influenza B NOT DETECTED NOT DETECTED Final   Parainfluenza Virus 1 NOT DETECTED NOT DETECTED Final   Parainfluenza Virus 2 NOT DETECTED NOT DETECTED Final   Parainfluenza Virus 3 NOT DETECTED NOT DETECTED Final   Parainfluenza Virus 4 NOT DETECTED NOT DETECTED Final   Respiratory Syncytial Virus NOT DETECTED NOT DETECTED Final   Bordetella pertussis NOT DETECTED NOT DETECTED Final   Bordetella Parapertussis NOT DETECTED NOT DETECTED Final   Chlamydophila pneumoniae NOT DETECTED NOT DETECTED Final   Mycoplasma pneumoniae NOT DETECTED NOT DETECTED Final    Comment: Performed at [facility], [address] [place], [place] [address]    Procedures/Studies: CT Angio Chest PE W and/or Wo Contrast  Result Date: 12/20/2022 CLINICAL DATA:  High probability for PE, tachycardia. EXAM: CT ANGIOGRAPHY CHEST WITH CONTRAST TECHNIQUE: Multidetector CT imaging of the chest was performed using the standard protocol during bolus administration of intravenous contrast. Multiplanar CT image reconstructions and MIPs were obtained to evaluate the vascular anatomy. RADIATION DOSE REDUCTION: This exam was performed according to the departmental dose-optimization program which includes automated exposure control, adjustment of the mA and/or kV according to patient size and/or use of iterative reconstruction  technique. CONTRAST:  80mL OMNIPAQUE IOHEXOL 350 MG/ML SOLN COMPARISON:  None Available. FINDINGS: Cardiovascular: Satisfactory opacification of the pulmonary arteries to the segmental level. No evidence of pulmonary embolism. Normal heart size. No pericardial effusion. Mediastinum/Nodes: No enlarged mediastinal, hilar, or axillary lymph nodes. Thyroid gland, trachea, and esophagus demonstrate no significant findings. Lungs/Pleura: There is mild elevation of the right hemidiaphragm. There is a band of atelectasis/airspace disease in the inferior right upper lobe abutting the minor fissure. There is also small amount of airspace disease in the central right lower lobe near the hilum with atelectasis in the bilateral lung bases, right greater than left. There is no pleural effusion or pneumothorax. Upper Abdomen: Subcentimeter renal cysts are present. No acute abnormality. Musculoskeletal: Degenerative changes affect the spine. Review of the MIP images confirms the above findings. IMPRESSION: 1. No evidence for pulmonary embolism. 2. Mild elevation of the right hemidiaphragm. 3. Right upper lobe and right lower lobe airspace disease worrisome for pneumonia. Recommend follow-up imaging to resolution. Recommend follow-up PA and lateral chest x-ray in 4-6  weeks to confirm resolution. 4. Bilateral subcentimeter renal cysts. No follow-up imaging recommended. Electronically Signed   By: Darliss Cheney M.D.   On: 12/20/2022 20:10     Time coordinating discharge: Over 30 minutes    Lewie Chamber, MD  [facility] 12/23/2022, 5:05 PM

## 2022-12-23 NOTE — Plan of Care (Signed)
  Problem: Clinical Measurements: Goal: Will remain free from infection Outcome: Progressing Goal: Diagnostic test results will improve Outcome: Progressing Goal: Respiratory complications will improve Outcome: Progressing   Problem: Clinical Measurements: Goal: Diagnostic test results will improve Outcome: Progressing   Problem: Clinical Measurements: Goal: Respiratory complications will improve Outcome: Progressing   Problem: Pain Managment: Goal: General experience of comfort will improve Outcome: Adequate for Discharge   Problem: Safety: Goal: Ability to remain free from injury will improve Outcome: Progressing

## 2022-12-26 LAB — CULTURE, BLOOD (ROUTINE X 2)
Culture: NO GROWTH
Culture: NO GROWTH
Special Requests: ADEQUATE
Special Requests: ADEQUATE

## 2023-02-15 ENCOUNTER — Ambulatory Visit: Payer: No Typology Code available for payment source | Admitting: Neurology

## 2023-02-22 ENCOUNTER — Encounter: Payer: Self-pay | Admitting: Neurology

## 2023-02-22 ENCOUNTER — Ambulatory Visit (INDEPENDENT_AMBULATORY_CARE_PROVIDER_SITE_OTHER): Payer: No Typology Code available for payment source | Admitting: Neurology

## 2023-02-22 VITALS — BP 122/74 | Ht 72.0 in | Wt 215.0 lb

## 2023-02-22 DIAGNOSIS — G8111 Spastic hemiplegia affecting right dominant side: Secondary | ICD-10-CM

## 2023-02-22 MED ORDER — ABOBOTULINUMTOXINA 500 UNITS IM SOLR
1500.0000 [IU] | Freq: Once | INTRAMUSCULAR | Status: AC
Start: 2023-02-22 — End: 2023-02-22
  Administered 2023-02-22: 1500 [IU] via INTRAMUSCULAR

## 2023-02-22 NOTE — Progress Notes (Signed)
Chief Complaint  Patient presents with   dysport    Rm 12 ready, dysport      ASSESSMENT AND PLAN  Trevor Anderson is a 67 y.o. 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,  Limited range of motion of right shoulder, elbow, painful on passive stretch,  Electrical stimulation guided botulism toxin injection since August 2023, used  xeomin 600 units previously, per insurance requirement, changed to Dysport 500 units x 3 starting May 24, 2022  Used dysport 500 units ( into 2.5 cc of [place])x3=1500 units today  Right pectoralis major 2.0 cc Right latissimus dorsi 2.0 cc Right brachialis 1.0 cc  Right Pronator teres 1.0  units Right palmaris longus 0.5 units Right flexor digitorum profundus 0.5 cc Right flexor digitorum superficialis 0.5 c    DIAGNOSTIC DATA (LABS, IMAGING, TESTING) - I reviewed patient records, labs, notes, testing and imaging myself where available.   MEDICAL HISTORY: Trevor Anderson is 67 year old male, accompanied by his son, referred by Shanda Bumps for evaluation of botulism toxin injection for spastic right hemiparesis  Chart reviewed, past medical history Hypertension Hyperlipidemia Diabetes Congestive heart failure, cardiomyopathy, Stroke  He suffered acute stroke in November 2021, personally reviewed MRI of the brain, left basal ganglion acute infarction, and left paramedian pontine lacunar infarction  CT angiogram showed no large vessel disease, moderate bilateral M2, left P2, ACA stenosis  Echocardiogram 40 to 50%  TEE small PFO, felt to be incidental  Lower extremity venous Doppler was negative, LDL was 173, A1c was 6.8, he had loop recorder placement  He now lives at home with his son, came in wheelchair today, spent most of the time in sitting position, wearing right AFO, need help transfer, putting on clothes, complains of right shoulder elbow finger pain with passive stretch, he does have caregiver 7 hours  each day  Update December 07, 2021: Electrical stimulation guided xeomin injection for spastic right hemiparesis, we use xeomin 600 units daily, focus on right upper extremity  Update March 01, 2022, He responded well to his injection, son reported that he can stretch his right finger much better, no significant side effect, he is receiving home PT OT  UPDATE May 24 2022: He responded well to previous injection, right shoulder, elbow, and less painful, less spasm, per insurance requirement, changed to Dysport 500 units x 3 today,  UPDATE Aug 23 2022: Previous injection did help his right shoulder pain, no side effect, wear right AFO, take few steps,  UPDATE November 23 2023: He responded well to previous injection, no significant side effect noted  UPDATE Feb 22 2023: He responded very well, better range of motion, going through physical therapy,  PHYSICAL EXAM:   125/80 HR 90  PHYSICAL EXAMNIATION:  Gen: NAD, conversant, well nourised, well groomed                     Cardiovascular: Regular rate rhythm, no peripheral edema, warm, nontender. Eyes: Conjunctivae clear without exudates or hemorrhage Neck: Supple, no carotid bruits. Pulmonary: Clear to auscultation bilaterally   NEUROLOGICAL EXAM:  MENTAL STATUS: Speech/cognition: Dysarthria, aphasia, following commands, mild finger agnosia,   CN VII: Right lower face weakness   MOTOR: Spastic right hemiparesis, involving right upper and lower extremity, no antigravity movement,  painful on passive stretch of the right shoulder, elbow, able to straight out right hand, mild fixed spasticity of right knee, tendency for right knee adduction, ankle plantarflexion, wear right AFO  REVIEW OF SYSTEMS:  Full 14 system review of systems performed and notable only for as above All other review of systems were negative.   ALLERGIES: No Known Allergies  HOME MEDICATIONS: Current Outpatient Medications  Medication Sig Dispense Refill    AbobotulinumtoxinA (DYSPORT) 500 units SOLR injection Inject into the muscles every 3 months. 3 each 2   amLODipine (NORVASC) 10 MG tablet Take 10 mg by mouth daily.     apixaban (ELIQUIS) 5 MG TABS tablet Take 5 mg by mouth 2 (two) times daily.     atorvastatin (LIPITOR) 40 MG tablet Take 40 mg by mouth at bedtime.     baclofen (LIORESAL) 10 MG tablet Take 10 mg by mouth 3 (three) times daily.     carvedilol (COREG) 25 MG tablet Take 12.5 mg by mouth 2 (two) times daily.     Cholecalciferol 50 MCG (2000 UT) TABS Take 2,000 Units by mouth daily.     cyanocobalamin (VITAMIN B12) 500 MCG tablet Take 500 mcg by mouth 2 (two) times daily.     Lancets MISC 1 application by Does not apply route daily. 100 each 11   losartan (COZAAR) 50 MG tablet Take 0.5 tablets by mouth daily.     naloxone (NARCAN) nasal spray 4 mg/0.1 mL Place 1 spray into the nose See admin instructions. "CALL 911 IMMEDIATELY, ADMINISTER DOSE, THEN TURN PERSON ON SIDE - IF NO RESPONSE IN 2-3 MINUTES OR PERSON RESPONDS BUT RELAPSES, REPEAT USING A NEW SPRAY DEVICE AND SPRAY INTO THE OTHER NOSTRIL FOR OPIOID REVERSAL"     omeprazole (PRILOSEC) 20 MG capsule Take 20 mg by mouth daily.     potassium chloride SA (KLOR-CON M) 20 MEQ tablet Take 20 mEq by mouth at bedtime.     senna (SENOKOT) 8.6 MG tablet Take 1 tablet by mouth 2 (two) times daily.     sertraline (ZOLOFT) 100 MG tablet Take 50 mg by mouth daily.     traMADol (ULTRAM) 50 MG tablet Take 50 mg by mouth every 6 (six) hours as needed for moderate pain.     hydrALAZINE (APRESOLINE) 25 MG tablet Take 25 mg by mouth 3 (three) times daily. (Patient not taking: Reported on 02/22/2023)     No current facility-administered medications for this visit.    PAST MEDICAL HISTORY: 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   CONGESTIVE HEART FAILURE 11/12/2007   DIABETES MELLITUS, TYPE II 11/20/2008   HYPERLIPIDEMIA 11/12/2007   HYPERTENSION  11/12/2007   TACHYCARDIA 01/24/2010    PAST SURGICAL HISTORY: Past Surgical History:  Procedure Laterality Date   BUBBLE STUDY  03/16/2020   Procedure: BUBBLE STUDY;  Surgeon: Jodelle Red, MD;  Location: [facility] ENDOSCOPY;  Service: Cardiovascular;;   LOOP RECORDER INSERTION N/A 03/23/2020   Procedure: LOOP RECORDER INSERTION;  Surgeon: Hillis Range, MD;  Location: [facility] INVASIVE CV LAB;  Service: Cardiovascular;  Laterality: N/A;   TEE WITHOUT CARDIOVERSION N/A 03/16/2020   Procedure: TRANSESOPHAGEAL ECHOCARDIOGRAM (TEE);  Surgeon: Jodelle Red, MD;  Location: [facility] ENDOSCOPY;  Service: Cardiovascular;  Laterality: N/A;   TONSILLECTOMY  1970    FAMILY HISTORY: Family History  Problem Relation Age of Onset   Kidney disease Mother     SOCIAL HISTORY: Social History   Socioeconomic History   Marital status: Single    Spouse name: Not on file   Number of children: Not on file   Years of education: Not on file   Highest education level: Not on file  Occupational History   Not on file  Tobacco Use   Smoking status: Never   Smokeless tobacco: Never  Substance and Sexual Activity   Alcohol use: Yes    Comment: social   Drug use: No   Sexual activity: Not on file  Other Topics Concern   Not on file  Social History Narrative   In motorized wheelchair    Right handed   Caffeine- 1 cup occasionally   Social Determinants of Health   Financial Resource Strain: Not on file  Food Insecurity: No Food Insecurity (12/22/2022)   Hunger Vital Sign    Worried About Running Out of Food in the Last Year: Never true    Ran Out of Food in the Last Year: Never true  Transportation Needs: No Transportation Needs (12/22/2022)   PRAPARE - Administrator, Civil Service (Medical): No    Lack of Transportation (Non-Medical): No  Physical Activity: Not on file  Stress: Not on file  Social Connections: Not on file  Intimate Partner Violence: Not At Risk (12/22/2022)    Humiliation, Afraid, Rape, and Kick questionnaire    Fear of Current or Ex-Partner: No    Emotionally Abused: No    Physically Abused: No    Sexually Abused: No      Levert Feinstein, M.D. Ph.D.  [facility] [address] [place], [place] [address] Ph: [phone] Fax: [phone]  CC:  No referring provider defined for this encounter.  [name], MD

## 2023-02-22 NOTE — Progress Notes (Signed)
Dysport 500units x 3 vial  [id number] [phone] Exp-02.28.2026 B/B Bacteriostatic 0.9% Sodium Chloride- 7.5 mL  Lot: [id number] Expiration: 04.01.2025 NDC: 0630160109 Dx: G81.11 WITNESSED NA:TFTDDUK, CMA

## 2023-03-28 NOTE — Telephone Encounter (Signed)
Angie let me know that DOS 02/22/23 had been denied by the [place], they state that auth expired on 02/19/23 and was not auto renewed as previously stated below. I reached out to [place] with the [place] who was able to extend the auth expiration date to 02/23/23 to cover 02/22/23 DOS. I filled out an additional RFS form to cover the next visit and placed in nurse pod for MD signature.

## 2023-05-23 ENCOUNTER — Ambulatory Visit: Payer: No Typology Code available for payment source | Admitting: Neurology

## 2023-05-24 ENCOUNTER — Ambulatory Visit: Payer: No Typology Code available for payment source | Admitting: Neurology

## 2023-05-30 ENCOUNTER — Ambulatory Visit (INDEPENDENT_AMBULATORY_CARE_PROVIDER_SITE_OTHER): Payer: No Typology Code available for payment source | Admitting: Neurology

## 2023-05-30 DIAGNOSIS — G8111 Spastic hemiplegia affecting right dominant side: Secondary | ICD-10-CM | POA: Diagnosis not present

## 2023-05-30 MED ORDER — ABOBOTULINUMTOXINA 500 UNITS IM SOLR
1500.0000 [IU] | Freq: Once | INTRAMUSCULAR | Status: AC
Start: 2023-05-30 — End: 2023-05-30
  Administered 2023-05-30: 1500 [IU] via INTRAMUSCULAR

## 2023-05-30 NOTE — Progress Notes (Signed)
 Dysport  500 units x 3 vial  [id number] [id number] Exp-06/21/2024 Lot- [id number] Exp-06/21/2024 B/B Bacteriostatic 0.9% Sodium Chloride - 7.5 mL  Lot: [id number] Expiration: 02/23/2024 NDC: 6073-7106-26 Dx: G81.11 WITNESSED RS:WNIOEVO F CMA

## 2023-05-30 NOTE — Progress Notes (Signed)
 Chief Complaint  Patient presents with   Injections    Pt in 14,  Pt is here for Dysport .       ASSESSMENT AND PLAN  Trevor Anderson is a 69 y.o. 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,  Limited range of motion of right shoulder, elbow, painful on passive stretch,  Electrical stimulation guided botulism toxin injection since August 2023, used  xeomin  600 units previously, per insurance requirement, changed to Dysport  500 units x 3 starting May 24, 2022  Used dysport  500 units ( into 2.5 cc of [place])x3=1500 units today  Right pectoralis major 2.0 cc Right latissimus dorsi 1.5 cc Right brachialis 1.0 cc  Right Pronator teres 1.0  units Right palmaris longus 0.5 units Right flexor digitorum profundus 0.5 cc Right flexor digitorum superficialis 0.5 c Right lumbrica 0.5 cc divided into 3 injection sites.   DIAGNOSTIC DATA (LABS, IMAGING, TESTING) - I reviewed patient records, labs, notes, testing and imaging myself where available.   MEDICAL HISTORY: Trevor Anderson is 68 year old male, accompanied by his son, referred by Harlene for evaluation of botulism toxin injection for spastic right hemiparesis  Chart reviewed, past medical history Hypertension Hyperlipidemia Diabetes Congestive heart failure, cardiomyopathy, Stroke  He suffered acute stroke in November 2021, personally reviewed MRI of the brain, left basal ganglion acute infarction, and left paramedian pontine lacunar infarction  CT angiogram showed no large vessel disease, moderate bilateral M2, left P2, ACA stenosis  Echocardiogram 40 to 50%  TEE small PFO, felt to be incidental  Lower extremity venous Doppler was negative, LDL was 173, A1c was 6.8, he had loop recorder placement  He now lives at home with his son, came in wheelchair today, spent most of the time in sitting position, wearing right AFO, need help transfer, putting on clothes, complains of right shoulder  elbow finger pain with passive stretch, he does have caregiver 7 hours each day  Update December 07, 2021: Electrical stimulation guided xeomin  injection for spastic right hemiparesis, we use xeomin  600 units daily, focus on right upper extremity  Update March 01, 2022, He responded well to his injection, son reported that he can stretch his right finger much better, no significant side effect, he is receiving home PT OT  UPDATE May 24 2022: He responded well to previous injection, right shoulder, elbow, and less painful, less spasm, per insurance requirement, changed to Dysport  500 units x 3 today,  UPDATE Aug 23 2022: Previous injection did help his right shoulder pain, no side effect, wear right AFO, take few steps,  UPDATE November 23 2023: He responded well to previous injection, no significant side effect noted  UPDATE Feb 22 2023: He responded very well, better range of motion, going through physical therapy,  UPDATE Feb 5th 2025: Previous injection did help his range of motion and left shoulder pain  PHYSICAL EXAM:   125/80 HR 90  PHYSICAL EXAMNIATION:  Gen: NAD, conversant, well nourised, well groomed                     Cardiovascular: Regular rate rhythm, no peripheral edema, warm, nontender. Eyes: Conjunctivae clear without exudates or hemorrhage Neck: Supple, no carotid bruits. Pulmonary: Clear to auscultation bilaterally   NEUROLOGICAL EXAM:  MENTAL STATUS: Speech/cognition: Dysarthria, aphasia, following commands, mild finger agnosia,   CN VII: Right lower face weakness   MOTOR: Spastic right hemiparesis, involving right upper and lower extremity, no antigravity movement,  painful  on passive stretch of the right shoulder, elbow, able to straight out right hand, mild fixed spasticity of right knee, tendency for right knee adduction, ankle plantarflexion, wear right AFO    REVIEW OF SYSTEMS:  Full 14 system review of systems performed and notable only for as  above All other review of systems were negative.   ALLERGIES: No Known Allergies  HOME MEDICATIONS: Current Outpatient Medications  Medication Sig Dispense Refill   AbobotulinumtoxinA  (DYSPORT ) 500 units SOLR injection Inject into the muscles every 3 months. 3 each 2   amLODipine  (NORVASC ) 10 MG tablet Take 10 mg by mouth daily.     apixaban  (ELIQUIS ) 5 MG TABS tablet Take 5 mg by mouth 2 (two) times daily.     atorvastatin  (LIPITOR ) 40 MG tablet Take 40 mg by mouth at bedtime.     baclofen  (LIORESAL ) 10 MG tablet Take 10 mg by mouth 3 (three) times daily.     carvedilol  (COREG ) 25 MG tablet Take 12.5 mg by mouth 2 (two) times daily.     Cholecalciferol 50 MCG (2000 UT) TABS Take 2,000 Units by mouth daily.     cyanocobalamin (VITAMIN B12) 500 MCG tablet Take 500 mcg by mouth 2 (two) times daily.     hydrALAZINE  (APRESOLINE ) 25 MG tablet Take 25 mg by mouth 3 (three) times daily.     Lancets MISC 1 application by Does not apply route daily. 100 each 11   losartan (COZAAR) 50 MG tablet Take 0.5 tablets by mouth daily.     naloxone (NARCAN) nasal spray 4 mg/0.1 mL Place 1 spray into the nose See admin instructions. CALL 911 IMMEDIATELY, ADMINISTER DOSE, THEN TURN PERSON ON SIDE - IF NO RESPONSE IN 2-3 MINUTES OR PERSON RESPONDS BUT RELAPSES, REPEAT USING A NEW SPRAY DEVICE AND SPRAY INTO THE OTHER NOSTRIL FOR OPIOID REVERSAL     omeprazole (PRILOSEC) 20 MG capsule Take 20 mg by mouth daily.     potassium chloride  SA (KLOR-CON  M) 20 MEQ tablet Take 20 mEq by mouth at bedtime.     senna (SENOKOT) 8.6 MG tablet Take 1 tablet by mouth 2 (two) times daily.     sertraline  (ZOLOFT ) 100 MG tablet Take 50 mg by mouth daily.     traMADol (ULTRAM) 50 MG tablet Take 50 mg by mouth every 6 (six) hours as needed for moderate pain.     Current Facility-Administered Medications  Medication Dose Route Frequency Provider Last Rate Last Admin   AbobotulinumtoxinA  (DYSPORT ) 500 units injection 1,500 Units   1,500 Units Intramuscular Once Onita Duos, MD        PAST MEDICAL HISTORY: 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   CONGESTIVE HEART FAILURE 11/12/2007   DIABETES MELLITUS, TYPE II 11/20/2008   HYPERLIPIDEMIA 11/12/2007   HYPERTENSION 11/12/2007   TACHYCARDIA 01/24/2010    PAST SURGICAL HISTORY: Past Surgical History:  Procedure Laterality Date   BUBBLE STUDY  03/16/2020   Procedure: BUBBLE STUDY;  Surgeon: Lonni Slain, MD;  Location: [facility] ENDOSCOPY;  Service: Cardiovascular;;   LOOP RECORDER INSERTION N/A 03/23/2020   Procedure: LOOP RECORDER INSERTION;  Surgeon: Kelsie Agent, MD;  Location: [facility] INVASIVE CV LAB;  Service: Cardiovascular;  Laterality: N/A;   TEE WITHOUT CARDIOVERSION N/A 03/16/2020   Procedure: TRANSESOPHAGEAL ECHOCARDIOGRAM (TEE);  Surgeon: Lonni Slain, MD;  Location: [facility] ENDOSCOPY;  Service: Cardiovascular;  Laterality: N/A;   TONSILLECTOMY  1970    FAMILY HISTORY: Family History  Problem Relation Age of Onset  Kidney disease Mother     SOCIAL HISTORY: Social History   Socioeconomic History   Marital status: Single    Spouse name: Not on file   Number of children: Not on file   Years of education: Not on file   Highest education level: Not on file  Occupational History   Not on file  Tobacco Use   Smoking status: Never   Smokeless tobacco: Never  Substance and Sexual Activity   Alcohol use: Yes    Comment: social   Drug use: No   Sexual activity: Not on file  Other Topics Concern   Not on file  Social History Narrative   In motorized wheelchair    Right handed   Caffeine- 1 cup occasionally   Social Drivers of Health   Financial Resource Strain: Not on file  Food Insecurity: No Food Insecurity (12/22/2022)   Hunger Vital Sign    Worried About Running Out of Food in the Last Year: Never true    Ran Out of Food in the Last Year: Never true  Transportation Needs: No Transportation Needs  (12/22/2022)   PRAPARE - Administrator, Civil Service (Medical): No    Lack of Transportation (Non-Medical): No  Physical Activity: Not on file  Stress: Not on file  Social Connections: Not on file  Intimate Partner Violence: Not At Risk (12/22/2022)   Humiliation, Afraid, Rape, and Kick questionnaire    Fear of Current or Ex-Partner: No    Emotionally Abused: No    Physically Abused: No    Sexually Abused: No      Modena Callander, M.D. Ph.D.  [facility] [address] [place], [place] [address] Ph: [phone] Fax: [phone]  CC:  Norleen Lynwood ORN, MD [address] [place],  [place] [address]  Norleen Lynwood ORN, MD

## 2023-08-23 ENCOUNTER — Ambulatory Visit: Payer: No Typology Code available for payment source | Admitting: Neurology

## 2023-08-29 ENCOUNTER — Telehealth: Payer: Self-pay | Admitting: Neurology

## 2023-08-29 ENCOUNTER — Ambulatory Visit (INDEPENDENT_AMBULATORY_CARE_PROVIDER_SITE_OTHER): Payer: No Typology Code available for payment source | Admitting: Neurology

## 2023-08-29 VITALS — BP 146/89 | HR 94

## 2023-08-29 DIAGNOSIS — G8113 Spastic hemiplegia affecting right nondominant side: Secondary | ICD-10-CM

## 2023-08-29 DIAGNOSIS — I639 Cerebral infarction, unspecified: Secondary | ICD-10-CM

## 2023-08-29 DIAGNOSIS — G8111 Spastic hemiplegia affecting right dominant side: Secondary | ICD-10-CM

## 2023-08-29 DIAGNOSIS — I69319 Unspecified symptoms and signs involving cognitive functions following cerebral infarction: Secondary | ICD-10-CM

## 2023-08-29 MED ORDER — ABOBOTULINUMTOXINA 500 UNITS IM SOLR
1500.0000 [IU] | Freq: Once | INTRAMUSCULAR | Status: AC
Start: 2023-08-29 — End: 2023-08-29
  Administered 2023-08-29: 1500 [IU] via INTRAMUSCULAR

## 2023-08-29 NOTE — Progress Notes (Signed)
 Chief Complaint  Patient presents with   Follow-up    Pt in room 13. Son in room. Here for Spastic hemiparesis of right dominant side.      ASSESSMENT AND PLAN  Trevor Anderson is a 68 y.o. 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,  Limited range of motion of right shoulder, elbow, painful on passive stretch,  Electrical stimulation guided botulism toxin injection since August 2023, used  xeomin  600 units previously, per insurance requirement, changed to Dysport  500 units x 3 starting May 24, 2022  Used dysport  500 units ( into 2.5 cc of [place])x3=1500 units today  Right pectoralis major 2.0 cc Right latissimus dorsi 2.0 cc Right brachialis 1.0 cc  Right Pronator teres 1.0  units Right palmaris longus 0.5 units Right flexor digitorum profundus 0.5 cc Right flexor digitorum superficialis 0.5 c  DIAGNOSTIC DATA (LABS, IMAGING, TESTING) - I reviewed patient records, labs, notes, testing and imaging myself where available.   MEDICAL HISTORY: Trevor Anderson is 68 year old male, accompanied by his son, referred by Camilo Cella for evaluation of botulism toxin injection for spastic right hemiparesis  Chart reviewed, past medical history Hypertension Hyperlipidemia Diabetes Congestive heart failure, cardiomyopathy, Stroke  He suffered acute stroke in November 2021, personally reviewed MRI of the brain, left basal ganglion acute infarction, and left paramedian pontine lacunar infarction  CT angiogram showed no large vessel disease, moderate bilateral M2, left P2, ACA stenosis  Echocardiogram 40 to 50%  TEE small PFO, felt to be incidental  Lower extremity venous Doppler was negative, LDL was 173, A1c was 6.8, he had loop recorder placement  He now lives at home with his son, came in wheelchair today, spent most of the time in sitting position, wearing right AFO, need help transfer, putting on clothes, complains of right shoulder elbow finger  pain with passive stretch, he does have caregiver 7 hours each day  Update December 07, 2021: Electrical stimulation guided xeomin  injection for spastic right hemiparesis, we use xeomin  600 units daily, focus on right upper extremity  Update March 01, 2022, He responded well to his injection, son reported that he can stretch his right finger much better, no significant side effect, he is receiving home PT OT  UPDATE May 24 2022: He responded well to previous injection, right shoulder, elbow, and less painful, less spasm, per insurance requirement, changed to Dysport  500 units x 3 today,  UPDATE Aug 23 2022: Previous injection did help his right shoulder pain, no side effect, wear right AFO, take few steps,  UPDATE November 23 2023: He responded well to previous injection, no significant side effect noted  UPDATE Feb 22 2023: He responded very well, better range of motion, going through physical therapy,  UPDATE Feb 5th 2025: Previous injection did help his range of motion and left shoulder pain  UPDATE May 7th 2025: He gets mild benefit from his disport injection, there was no significant side effect noted  PHYSICAL EXAM:   125/80 HR 90  PHYSICAL EXAMNIATION:  Gen: NAD, conversant, well nourised, well groomed                     Cardiovascular: Regular rate rhythm, no peripheral edema, warm, nontender. Eyes: Conjunctivae clear without exudates or hemorrhage Neck: Supple, no carotid bruits. Pulmonary: Clear to auscultation bilaterally   NEUROLOGICAL EXAM:  MENTAL STATUS: Speech/cognition: Dysarthria, aphasia, following commands, mild finger agnosia,   CN VII: Right lower face weakness  MOTOR: Spastic right hemiparesis, involving right upper and lower extremity, no antigravity movement,  painful on passive stretch of the right shoulder, elbow, able to straight out right hand, mild fixed spasticity of right knee, tendency for right knee adduction, ankle plantarflexion, wear  right AFO    REVIEW OF SYSTEMS:  Full 14 system review of systems performed and notable only for as above All other review of systems were negative.   ALLERGIES: No Known Allergies  HOME MEDICATIONS: Current Outpatient Medications  Medication Sig Dispense Refill   AbobotulinumtoxinA  (DYSPORT ) 500 units SOLR injection Inject into the muscles every 3 months. 3 each 2   amLODipine  (NORVASC ) 10 MG tablet Take 10 mg by mouth daily.     apixaban  (ELIQUIS ) 5 MG TABS tablet Take 5 mg by mouth 2 (two) times daily.     atorvastatin  (LIPITOR ) 40 MG tablet Take 40 mg by mouth at bedtime.     baclofen  (LIORESAL ) 10 MG tablet Take 10 mg by mouth 3 (three) times daily.     carvedilol  (COREG ) 25 MG tablet Take 12.5 mg by mouth 2 (two) times daily.     Cholecalciferol 50 MCG (2000 UT) TABS Take 2,000 Units by mouth daily.     cyanocobalamin (VITAMIN B12) 500 MCG tablet Take 500 mcg by mouth 2 (two) times daily.     ferrous sulfate 325 (65 FE) MG tablet Take 325 mg by mouth daily with breakfast. 1 tablet Monday, Wednesday and Friday.     hydrALAZINE  (APRESOLINE ) 25 MG tablet Take 25 mg by mouth 3 (three) times daily.     Lancets MISC 1 application by Does not apply route daily. 100 each 11   losartan (COZAAR) 50 MG tablet Take 0.5 tablets by mouth daily.     naloxone (NARCAN) nasal spray 4 mg/0.1 mL Place 1 spray into the nose See admin instructions. "CALL 911 IMMEDIATELY, ADMINISTER DOSE, THEN TURN PERSON ON SIDE - IF NO RESPONSE IN 2-3 MINUTES OR PERSON RESPONDS BUT RELAPSES, REPEAT USING A NEW SPRAY DEVICE AND SPRAY INTO THE OTHER NOSTRIL FOR OPIOID REVERSAL"     omeprazole (PRILOSEC) 20 MG capsule Take 20 mg by mouth daily.     potassium chloride  SA (KLOR-CON  M) 20 MEQ tablet Take 20 mEq by mouth at bedtime.     senna (SENOKOT) 8.6 MG tablet Take 1 tablet by mouth 2 (two) times daily.     sertraline  (ZOLOFT ) 100 MG tablet Take 50 mg by mouth daily.     traMADol (ULTRAM) 50 MG tablet Take 50 mg by  mouth every 6 (six) hours as needed for moderate pain.     No current facility-administered medications for this visit.    PAST MEDICAL HISTORY: 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   CONGESTIVE HEART FAILURE 11/12/2007   DIABETES MELLITUS, TYPE II 11/20/2008   HYPERLIPIDEMIA 11/12/2007   HYPERTENSION 11/12/2007   TACHYCARDIA 01/24/2010    PAST SURGICAL HISTORY: Past Surgical History:  Procedure Laterality Date   BUBBLE STUDY  03/16/2020   Procedure: BUBBLE STUDY;  Surgeon: Sheryle Donning, MD;  Location: [facility] ENDOSCOPY;  Service: Cardiovascular;;   LOOP RECORDER INSERTION N/A 03/23/2020   Procedure: LOOP RECORDER INSERTION;  Surgeon: Jolly Needle, MD;  Location: [facility] INVASIVE CV LAB;  Service: Cardiovascular;  Laterality: N/A;   TEE WITHOUT CARDIOVERSION N/A 03/16/2020   Procedure: TRANSESOPHAGEAL ECHOCARDIOGRAM (TEE);  Surgeon: Sheryle Donning, MD;  Location: [facility] ENDOSCOPY;  Service: Cardiovascular;  Laterality: N/A;   TONSILLECTOMY  1970  FAMILY HISTORY: Family History  Problem Relation Age of Onset   Kidney disease Mother     SOCIAL HISTORY: Social History   Socioeconomic History   Marital status: Single    Spouse name: Not on file   Number of children: Not on file   Years of education: Not on file   Highest education level: Not on file  Occupational History   Not on file  Tobacco Use   Smoking status: Never   Smokeless tobacco: Never  Substance and Sexual Activity   Alcohol use: Yes    Comment: social   Drug use: No   Sexual activity: Not on file  Other Topics Concern   Not on file  Social History Narrative   In motorized wheelchair    Right handed   Caffeine- 1 cup occasionally   Social Drivers of Health   Financial Resource Strain: Not on file  Food Insecurity: No Food Insecurity (12/22/2022)   Hunger Vital Sign    Worried About Running Out of Food in the Last Year: Never true    Ran Out of Food in  the Last Year: Never true  Transportation Needs: No Transportation Needs (12/22/2022)   PRAPARE - Administrator, Civil Service (Medical): No    Lack of Transportation (Non-Medical): No  Physical Activity: Not on file  Stress: Not on file  Social Connections: Not on file  Intimate Partner Violence: Not At Risk (12/22/2022)   Humiliation, Afraid, Rape, and Kick questionnaire    Fear of Current or Ex-Partner: No    Emotionally Abused: No    Physically Abused: No    Sexually Abused: No      Phebe Brasil, M.D. Ph.D.  [facility] [address] [place], [place] [address] Ph: [phone] Fax: [phone]  CC:  Roslyn Coombe, MD [address] [place],  [place] [address]  Roslyn Coombe, MD

## 2023-08-29 NOTE — Progress Notes (Signed)
 Dysport  500units x 3 vial  [id number] [id number] Exp-01/21/25 LOT: [id number] EXP: 06/21/24 [id number] EXP:02/21/25 B/B  Bacteriostatic 0.9% Sodium Chloride - 7.5 mL  Lot: [id number] Expiration: 02/23/24 NDC: 5784696295 Dx: M84.13  WITNESSED KG:[id number] RN

## 2023-08-29 NOTE — Telephone Encounter (Signed)
 Pt called to report that his transportation was late and that he likely will not arrive before 2:10 phone rep confirmed with RN that if he arrives after 2:10 he will not be seen, this message was relayed to pt

## 2023-10-31 ENCOUNTER — Telehealth: Payer: Self-pay | Admitting: Neurology

## 2023-10-31 NOTE — Telephone Encounter (Signed)
 Completed RFS form and emailed with OV notes to [email or web address].

## 2023-11-19 NOTE — Telephone Encounter (Signed)
 auth#: [id number] (11/01/23-04/29/24)

## 2023-11-21 ENCOUNTER — Ambulatory Visit: Admitting: Neurology

## 2023-11-28 ENCOUNTER — Ambulatory Visit: Admitting: Neurology

## 2023-11-28 VITALS — BP 122/77 | HR 93

## 2023-11-28 DIAGNOSIS — I639 Cerebral infarction, unspecified: Secondary | ICD-10-CM

## 2023-11-28 DIAGNOSIS — G8111 Spastic hemiplegia affecting right dominant side: Secondary | ICD-10-CM

## 2023-11-28 DIAGNOSIS — G8113 Spastic hemiplegia affecting right nondominant side: Secondary | ICD-10-CM

## 2023-11-28 MED ORDER — ABOBOTULINUMTOXINA 500 UNITS IM SOLR
500.0000 [IU] | Freq: Once | INTRAMUSCULAR | Status: AC
Start: 2023-11-28 — End: 2023-12-01
  Administered 2023-12-01: 500 [IU] via INTRAMUSCULAR

## 2023-11-28 NOTE — Progress Notes (Signed)
 Chief Complaint  Patient presents with   Injections    Room 14 pt is with son   green stim Dysport  B/B 500u auth#: [id number] (11/01/23-04/29/24)       ASSESSMENT AND PLAN  Trevor Anderson is a 68 y.o. 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,  Limited range of motion of right shoulder, elbow, painful on passive stretch,  Electrical stimulation guided botulism toxin injection since August 2023, used  xeomin  600 units previously, per insurance requirement, changed to Dysport  500 units x 3 starting May 24, 2022  Used dysport  500 units ( into 2.5 cc of [place])  Right pectoralis major 0.5 cc Right latissimus dorsi 0.5 cc Right Pronator teres 0.5 units  Right flexor digitorum profundus 0.5 cc Right flexor digitorum superficialis 0.5 c  DIAGNOSTIC DATA (LABS, IMAGING, TESTING) - I reviewed patient records, labs, notes, testing and imaging myself where available.   MEDICAL HISTORY: [name] is 68 year old male, accompanied by his son, referred by Harlene for evaluation of botulism toxin injection for spastic right hemiparesis  Chart reviewed, past medical history Hypertension Hyperlipidemia Diabetes Congestive heart failure, cardiomyopathy, Stroke  He suffered acute stroke in November 2021, personally reviewed MRI of the brain, left basal ganglion acute infarction, and left paramedian pontine lacunar infarction  CT angiogram showed no large vessel disease, moderate bilateral M2, left P2, ACA stenosis  Echocardiogram 40 to 50%  TEE small PFO, felt to be incidental  Lower extremity venous Doppler was negative, LDL was 173, A1c was 6.8, he had loop recorder placement  He now lives at home with his son, came in wheelchair today, spent most of the time in sitting position, wearing right AFO, need help transfer, putting on clothes, complains of right shoulder elbow finger pain with passive stretch, he does have caregiver 7 hours each  day  Update December 07, 2021: Electrical stimulation guided xeomin  injection for spastic right hemiparesis, we use xeomin  600 units daily, focus on right upper extremity  Update March 01, 2022, He responded well to his injection, son reported that he can stretch his right finger much better, no significant side effect, he is receiving home PT OT  UPDATE May 24 2022: He responded well to previous injection, right shoulder, elbow, and less painful, less spasm, per insurance requirement, changed to Dysport  500 units x 3 today,  UPDATE Aug 23 2022: Previous injection did help his right shoulder pain, no side effect, wear right AFO, take few steps,  UPDATE November 23 2023: He responded well to previous injection, no significant side effect noted  UPDATE Feb 22 2023: He responded very well, better range of motion, going through physical therapy,  UPDATE Feb 5th 2025: Previous injection did help his range of motion and left shoulder pain  UPDATE May 7th 2025: He gets mild benefit from his disport injection, there was no significant side effect noted  PHYSICAL EXAM:   125/80 HR 90  PHYSICAL EXAMNIATION:  Gen: NAD, conversant, well nourised, well groomed                     Cardiovascular: Regular rate rhythm, no peripheral edema, warm, nontender. Eyes: Conjunctivae clear without exudates or hemorrhage Neck: Supple, no carotid bruits. Pulmonary: Clear to auscultation bilaterally   NEUROLOGICAL EXAM:  MENTAL STATUS: Speech/cognition: Dysarthria, aphasia, following commands, mild finger agnosia,   CN VII: Right lower face weakness   MOTOR: Spastic right hemiparesis, involving right upper and lower extremity,  no antigravity movement,  painful on passive stretch of the right shoulder, elbow, able to straight out right hand, mild fixed spasticity of right knee, tendency for right knee adduction, ankle plantarflexion, wear right AFO    REVIEW OF SYSTEMS:  Full 14 system review of  systems performed and notable only for as above All other review of systems were negative.   ALLERGIES: No Known Allergies  HOME MEDICATIONS: Current Outpatient Medications  Medication Sig Dispense Refill   AbobotulinumtoxinA  (DYSPORT ) 500 units SOLR injection Inject into the muscles every 3 months. 3 each 2   amLODipine  (NORVASC ) 10 MG tablet Take 10 mg by mouth daily.     apixaban  (ELIQUIS ) 5 MG TABS tablet Take 5 mg by mouth 2 (two) times daily.     atorvastatin  (LIPITOR ) 40 MG tablet Take 40 mg by mouth at bedtime.     baclofen  (LIORESAL ) 10 MG tablet Take 10 mg by mouth 3 (three) times daily.     carvedilol  (COREG ) 25 MG tablet Take 12.5 mg by mouth 2 (two) times daily.     Cholecalciferol 50 MCG (2000 UT) TABS Take 2,000 Units by mouth daily.     cyanocobalamin (VITAMIN B12) 500 MCG tablet Take 500 mcg by mouth 2 (two) times daily.     ferrous sulfate 325 (65 FE) MG tablet Take 325 mg by mouth daily with breakfast. 1 tablet Monday, Wednesday and Friday.     hydrALAZINE  (APRESOLINE ) 25 MG tablet Take 25 mg by mouth 3 (three) times daily.     Lancets MISC 1 application by Does not apply route daily. 100 each 11   losartan (COZAAR) 50 MG tablet Take 0.5 tablets by mouth daily.     naloxone (NARCAN) nasal spray 4 mg/0.1 mL Place 1 spray into the nose See admin instructions. CALL 911 IMMEDIATELY, ADMINISTER DOSE, THEN TURN PERSON ON SIDE - IF NO RESPONSE IN 2-3 MINUTES OR PERSON RESPONDS BUT RELAPSES, REPEAT USING A NEW SPRAY DEVICE AND SPRAY INTO THE OTHER NOSTRIL FOR OPIOID REVERSAL     omeprazole (PRILOSEC) 20 MG capsule Take 20 mg by mouth daily.     potassium chloride  SA (KLOR-CON  M) 20 MEQ tablet Take 20 mEq by mouth at bedtime.     senna (SENOKOT) 8.6 MG tablet Take 1 tablet by mouth 2 (two) times daily.     sertraline  (ZOLOFT ) 100 MG tablet Take 50 mg by mouth daily.     traMADol (ULTRAM) 50 MG tablet Take 50 mg by mouth every 6 (six) hours as needed for moderate pain.      Current Facility-Administered Medications  Medication Dose Route Frequency Provider Last Rate Last Admin   AbobotulinumtoxinA  (DYSPORT ) 500 units injection 500 Units  500 Units Intramuscular Once Onita Duos, MD        PAST MEDICAL HISTORY: 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   CONGESTIVE HEART FAILURE 11/12/2007   DIABETES MELLITUS, TYPE II 11/20/2008   HYPERLIPIDEMIA 11/12/2007   HYPERTENSION 11/12/2007   TACHYCARDIA 01/24/2010    PAST SURGICAL HISTORY: Past Surgical History:  Procedure Laterality Date   BUBBLE STUDY  03/16/2020   Procedure: BUBBLE STUDY;  Surgeon: Lonni Slain, MD;  Location: [facility] ENDOSCOPY;  Service: Cardiovascular;;   LOOP RECORDER INSERTION N/A 03/23/2020   Procedure: LOOP RECORDER INSERTION;  Surgeon: Kelsie Agent, MD;  Location: [facility] INVASIVE CV LAB;  Service: Cardiovascular;  Laterality: N/A;   TEE WITHOUT CARDIOVERSION N/A 03/16/2020   Procedure: TRANSESOPHAGEAL ECHOCARDIOGRAM (TEE);  Surgeon: Lonni,  Shelda, MD;  Location: [facility] ENDOSCOPY;  Service: Cardiovascular;  Laterality: N/A;   TONSILLECTOMY  1970    FAMILY HISTORY: Family History  Problem Relation Age of Onset   Kidney disease Mother     SOCIAL HISTORY: Social History   Socioeconomic History   Marital status: Single    Spouse name: Not on file   Number of children: Not on file   Years of education: Not on file   Highest education level: Not on file  Occupational History   Not on file  Tobacco Use   Smoking status: Never   Smokeless tobacco: Never  Substance and Sexual Activity   Alcohol use: Yes    Comment: social   Drug use: No   Sexual activity: Not on file  Other Topics Concern   Not on file  Social History Narrative   In motorized wheelchair    Right handed   Caffeine- 1 cup occasionally   Social Drivers of Health   Financial Resource Strain: Not on file  Food Insecurity: No Food Insecurity (12/22/2022)   Hunger  Vital Sign    Worried About Running Out of Food in the Last Year: Never true    Ran Out of Food in the Last Year: Never true  Transportation Needs: No Transportation Needs (12/22/2022)   PRAPARE - Administrator, Civil Service (Medical): No    Lack of Transportation (Non-Medical): No  Physical Activity: Not on file  Stress: Not on file  Social Connections: Not on file  Intimate Partner Violence: Not At Risk (12/22/2022)   Humiliation, Afraid, Rape, and Kick questionnaire    Fear of Current or Ex-Partner: No    Emotionally Abused: No    Physically Abused: No    Sexually Abused: No      Modena Callander, M.D. Ph.D.  [facility] [address], [address] [place], [place] [address] Ph: [phone] Fax: [phone]  CC:  Norleen Lynwood ORN, MD [address] [place],  [place] [address]  Norleen Lynwood ORN, MD

## 2023-11-28 NOTE — Progress Notes (Signed)
 Dysport  500units x 1 vial  [id number] Exp-05/24/2025 B/B  Bacteriostatic 0.9% Sodium Chloride - 5 mL  Lot: [id number] Expiration: 02/21/25 NDC: 9590803397 Dx: G81.11 , I63.9 , G81.13 WITNESSED BY:a jones rn

## 2023-12-01 DIAGNOSIS — G8111 Spastic hemiplegia affecting right dominant side: Secondary | ICD-10-CM | POA: Diagnosis not present

## 2024-02-28 ENCOUNTER — Ambulatory Visit: Admitting: Neurology

## 2024-03-05 ENCOUNTER — Ambulatory Visit (INDEPENDENT_AMBULATORY_CARE_PROVIDER_SITE_OTHER): Admitting: Neurology

## 2024-03-05 VITALS — BP 154/100

## 2024-03-05 DIAGNOSIS — I639 Cerebral infarction, unspecified: Secondary | ICD-10-CM | POA: Diagnosis not present

## 2024-03-05 DIAGNOSIS — G8113 Spastic hemiplegia affecting right nondominant side: Secondary | ICD-10-CM

## 2024-03-05 DIAGNOSIS — G8111 Spastic hemiplegia affecting right dominant side: Secondary | ICD-10-CM | POA: Diagnosis not present

## 2024-03-05 MED ORDER — ABOBOTULINUMTOXINA 500 UNITS IM SOLR
500.0000 [IU] | Freq: Once | INTRAMUSCULAR | Status: AC
Start: 2024-03-05 — End: 2024-03-05
  Administered 2024-03-05: 500 [IU] via INTRAMUSCULAR

## 2024-03-05 NOTE — Progress Notes (Signed)
 Chief Complaint  Patient presents with   Injections    Emg rm2, son present, Pt is well and ready for injection    ASSESSMENT AND PLAN:  Trevor Anderson is a 68 y.o. 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,  Limited range of motion of right shoulder, elbow, painful on passive stretch,  Electrical stimulation guided botulism toxin injection since August 2023, used  xeomin  600 units previously, per insurance requirement, changed to Dysport  500 units since May 24, 2022  Used dysport  500 units ( into 2.5 cc of [place])  Right pectoralis major 0.5 cc Right latissimus dorsi 0.5 cc Right Pronator teres 0.5 units  Right flexor digitorum profundus 0.5 cc Right flexor digitorum superficialis 0.5 c  DIAGNOSTIC DATA (LABS, IMAGING, TESTING) - I reviewed patient records, labs, notes, testing and imaging myself where available.   MEDICAL HISTORY: Trevor Anderson is 68 year old male, accompanied by his son, referred by Harlene for evaluation of botulism toxin injection for spastic right hemiparesis  Chart reviewed, past medical history Hypertension Hyperlipidemia Diabetes Congestive heart failure, cardiomyopathy, Stroke  He suffered acute stroke in November 2021, personally reviewed MRI of the brain, left basal ganglion acute infarction, and left paramedian pontine lacunar infarction  CT angiogram showed no large vessel disease, moderate bilateral M2, left P2, ACA stenosis  Echocardiogram 40 to 50%  TEE small PFO, felt to be incidental  Lower extremity venous Doppler was negative, LDL was 173, A1c was 6.8, he had loop recorder placement  He now lives at home with his son, came in wheelchair today, spent most of the time in sitting position, wearing right AFO, need help transfer, putting on clothes, complains of right shoulder elbow finger pain with passive stretch, he does have caregiver 7 hours each day  Update December 07, 2021: Electrical  stimulation guided xeomin  injection for spastic right hemiparesis, we use xeomin  600 units daily, focus on right upper extremity  Update March 01, 2022, He responded well to his injection, son reported that he can stretch his right finger much better, no significant side effect, he is receiving home PT OT  UPDATE May 24 2022: He responded well to previous injection, right shoulder, elbow, and less painful, less spasm, per insurance requirement, changed to Dysport  500 units x 3 today,  UPDATE Aug 23 2022: Previous injection did help his right shoulder pain, no side effect, wear right AFO, take few steps,  UPDATE November 23 2023: He responded well to previous injection, no significant side effect noted  UPDATE Feb 22 2023: He responded very well, better range of motion, going through physical therapy,  UPDATE Feb 5th 2025: Previous injection did help his range of motion and left shoulder pain  UPDATE May 7th 2025: He gets mild benefit from his disport injection, there was no significant side effect noted  UPDATE Mar 05 2024: His right wrist has been helped by previous injection, left tight elbow, best fixed contraction,  PHYSICAL EXAM:   125/80 HR 90  PHYSICAL EXAMNIATION:  Gen: NAD, conversant, well nourised, well groomed                     Cardiovascular: Regular rate rhythm, no peripheral edema, warm, nontender. Eyes: Conjunctivae clear without exudates or hemorrhage Neck: Supple, no carotid bruits. Pulmonary: Clear to auscultation bilaterally   NEUROLOGICAL EXAM:  MENTAL STATUS: Speech/cognition: Dysarthria, aphasia, following commands, mild finger agnosia,   CN VII: Right lower face weakness  MOTOR: Spastic right hemiparesis, involving right upper and lower extremity, no antigravity movement,  painful on passive stretch of the right shoulder, elbow, able to straight out right hand, mild fixed spasticity of right knee, tendency for right knee adduction, ankle  plantarflexion, wear right AFO    REVIEW OF SYSTEMS:  Full 14 system review of systems performed and notable only for as above All other review of systems were negative.   ALLERGIES: No Known Allergies  HOME MEDICATIONS: Current Outpatient Medications  Medication Sig Dispense Refill   AbobotulinumtoxinA  (DYSPORT ) 500 units SOLR injection Inject into the muscles every 3 months. 3 each 2   amLODipine  (NORVASC ) 10 MG tablet Take 10 mg by mouth daily.     apixaban  (ELIQUIS ) 5 MG TABS tablet Take 5 mg by mouth 2 (two) times daily.     atorvastatin  (LIPITOR ) 40 MG tablet Take 40 mg by mouth at bedtime.     baclofen  (LIORESAL ) 10 MG tablet Take 10 mg by mouth 3 (three) times daily.     carvedilol  (COREG ) 25 MG tablet Take 12.5 mg by mouth 2 (two) times daily.     Cholecalciferol 50 MCG (2000 UT) TABS Take 2,000 Units by mouth daily.     cyanocobalamin (VITAMIN B12) 500 MCG tablet Take 500 mcg by mouth 2 (two) times daily.     ferrous sulfate 325 (65 FE) MG tablet Take 325 mg by mouth daily with breakfast. 1 tablet Monday, Wednesday and Friday.     hydrALAZINE  (APRESOLINE ) 25 MG tablet Take 25 mg by mouth 3 (three) times daily.     Lancets MISC 1 application by Does not apply route daily. 100 each 11   losartan (COZAAR) 50 MG tablet Take 0.5 tablets by mouth daily.     naloxone (NARCAN) nasal spray 4 mg/0.1 mL Place 1 spray into the nose See admin instructions. CALL 911 IMMEDIATELY, ADMINISTER DOSE, THEN TURN PERSON ON SIDE - IF NO RESPONSE IN 2-3 MINUTES OR PERSON RESPONDS BUT RELAPSES, REPEAT USING A NEW SPRAY DEVICE AND SPRAY INTO THE OTHER NOSTRIL FOR OPIOID REVERSAL     omeprazole (PRILOSEC) 20 MG capsule Take 20 mg by mouth daily.     potassium chloride  SA (KLOR-CON  M) 20 MEQ tablet Take 20 mEq by mouth at bedtime.     senna (SENOKOT) 8.6 MG tablet Take 1 tablet by mouth 2 (two) times daily.     sertraline  (ZOLOFT ) 100 MG tablet Take 50 mg by mouth daily.     traMADol (ULTRAM) 50 MG  tablet Take 50 mg by mouth every 6 (six) hours as needed for moderate pain.     Current Facility-Administered Medications  Medication Dose Route Frequency Provider Last Rate Last Admin   AbobotulinumtoxinA  (DYSPORT ) 500 units injection 500 Units  500 Units Intramuscular Once Onita Duos, MD        PAST MEDICAL HISTORY: 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   CONGESTIVE HEART FAILURE 11/12/2007   DIABETES MELLITUS, TYPE II 11/20/2008   HYPERLIPIDEMIA 11/12/2007   HYPERTENSION 11/12/2007   TACHYCARDIA 01/24/2010    PAST SURGICAL HISTORY: Past Surgical History:  Procedure Laterality Date   BUBBLE STUDY  03/16/2020   Procedure: BUBBLE STUDY;  Surgeon: Lonni Slain, MD;  Location: [facility] ENDOSCOPY;  Service: Cardiovascular;;   LOOP RECORDER INSERTION N/A 03/23/2020   Procedure: LOOP RECORDER INSERTION;  Surgeon: Kelsie Agent, MD;  Location: [facility] INVASIVE CV LAB;  Service: Cardiovascular;  Laterality: N/A;   TEE WITHOUT CARDIOVERSION N/A  03/16/2020   Procedure: TRANSESOPHAGEAL ECHOCARDIOGRAM (TEE);  Surgeon: Lonni Slain, MD;  Location: [facility] ENDOSCOPY;  Service: Cardiovascular;  Laterality: N/A;   TONSILLECTOMY  1970    FAMILY HISTORY: Family History  Problem Relation Age of Onset   Kidney disease Mother     SOCIAL HISTORY: Social History   Socioeconomic History   Marital status: Single    Spouse name: Not on file   Number of children: Not on file   Years of education: Not on file   Highest education level: Not on file  Occupational History   Not on file  Tobacco Use   Smoking status: Never   Smokeless tobacco: Never  Substance and Sexual Activity   Alcohol use: Yes    Comment: social   Drug use: No   Sexual activity: Not on file  Other Topics Concern   Not on file  Social History Narrative   In motorized wheelchair    Right handed   Caffeine- 1 cup occasionally   Social Drivers of Health   Financial Resource  Strain: Not on file  Food Insecurity: No Food Insecurity (12/22/2022)   Hunger Vital Sign    Worried About Running Out of Food in the Last Year: Never true    Ran Out of Food in the Last Year: Never true  Transportation Needs: No Transportation Needs (12/22/2022)   PRAPARE - Administrator, Civil Service (Medical): No    Lack of Transportation (Non-Medical): No  Physical Activity: Not on file  Stress: Not on file  Social Connections: Not on file  Intimate Partner Violence: Not At Risk (12/22/2022)   Humiliation, Afraid, Rape, and Kick questionnaire    Fear of Current or Ex-Partner: No    Emotionally Abused: No    Physically Abused: No    Sexually Abused: No      Modena Callander, M.D. Ph.D.  [facility] [address] [place], [place] [address] Ph: [phone] Fax: [phone]  CC:  Norleen Lynwood ORN, MD [address] [place],  [place] [address]  Norleen Lynwood ORN, MD

## 2024-03-05 NOTE — Progress Notes (Signed)
 Dysport  500units x 1 vial  [id number] Exp-05/24/25 B/B Bacteriostatic 0.9% Sodium Chloride - 2.5 mL  Lot: [id number] Expiration: 02/21/25 NDC: 9590803397 Dx: G81.11, i63.9, g81.13 WITNESSED BY:j webb ma

## 2024-05-01 NOTE — Telephone Encounter (Signed)
 Completed [place] form and placed in MD office for signature.

## 2024-05-05 MED ORDER — DYSPORT 500 UNITS IM SOLR: INTRAMUSCULAR | 2 refills | Status: AC

## 2024-05-05 NOTE — Telephone Encounter (Signed)
 Rx sent to the requested pharmacy below.

## 2024-05-05 NOTE — Telephone Encounter (Signed)
 Emailed RFS form to the [place] @ [email or web address].  Please resend Dysport  rx to [facility] - [place], [place] - [address] [facility] [address] [place] [address] Phone: [phone]  Fax: [phone]

## 2024-05-05 NOTE — Addendum Note (Signed)
 Addended by: DOUGLASS DELON CROME on: 05/05/2024 01:10 PM   Modules accepted: Orders

## 2024-05-28 ENCOUNTER — Ambulatory Visit: Admitting: Neurology

## 2024-07-09 ENCOUNTER — Ambulatory Visit: Admitting: Neurology
